# Patient Record
Sex: Female | Born: 1950 | Race: White | Hispanic: No | Marital: Married | Smoking: Never smoker
Health system: Southern US, Community
[De-identification: ages and names within clinical notes are randomized; demographics above are authoritative.]

## PROBLEM LIST (undated history)

## (undated) DIAGNOSIS — I10 Essential (primary) hypertension: Secondary | ICD-10-CM

## (undated) DIAGNOSIS — T7840XA Allergy, unspecified, initial encounter: Secondary | ICD-10-CM

## (undated) DIAGNOSIS — E785 Hyperlipidemia, unspecified: Secondary | ICD-10-CM

## (undated) DIAGNOSIS — Z5189 Encounter for other specified aftercare: Secondary | ICD-10-CM

## (undated) DIAGNOSIS — K219 Gastro-esophageal reflux disease without esophagitis: Secondary | ICD-10-CM

## (undated) DIAGNOSIS — R7301 Impaired fasting glucose: Secondary | ICD-10-CM

## (undated) DIAGNOSIS — F419 Anxiety disorder, unspecified: Secondary | ICD-10-CM

## (undated) DIAGNOSIS — R112 Nausea with vomiting, unspecified: Secondary | ICD-10-CM

## (undated) DIAGNOSIS — M797 Fibromyalgia: Secondary | ICD-10-CM

## (undated) DIAGNOSIS — F32A Depression, unspecified: Secondary | ICD-10-CM

## (undated) DIAGNOSIS — R011 Cardiac murmur, unspecified: Secondary | ICD-10-CM

## (undated) DIAGNOSIS — T8859XA Other complications of anesthesia, initial encounter: Secondary | ICD-10-CM

## (undated) DIAGNOSIS — T4145XA Adverse effect of unspecified anesthetic, initial encounter: Secondary | ICD-10-CM

## (undated) DIAGNOSIS — M26629 Arthralgia of temporomandibular joint, unspecified side: Secondary | ICD-10-CM

## (undated) DIAGNOSIS — J45909 Unspecified asthma, uncomplicated: Secondary | ICD-10-CM

## (undated) DIAGNOSIS — K5909 Other constipation: Secondary | ICD-10-CM

## (undated) DIAGNOSIS — H269 Unspecified cataract: Secondary | ICD-10-CM

## (undated) DIAGNOSIS — Z789 Other specified health status: Secondary | ICD-10-CM

## (undated) DIAGNOSIS — G35D Multiple sclerosis, unspecified: Secondary | ICD-10-CM

## (undated) DIAGNOSIS — F329 Major depressive disorder, single episode, unspecified: Secondary | ICD-10-CM

## (undated) DIAGNOSIS — I499 Cardiac arrhythmia, unspecified: Secondary | ICD-10-CM

## (undated) DIAGNOSIS — M1990 Unspecified osteoarthritis, unspecified site: Secondary | ICD-10-CM

## (undated) DIAGNOSIS — J189 Pneumonia, unspecified organism: Secondary | ICD-10-CM

## (undated) DIAGNOSIS — Z1379 Encounter for other screening for genetic and chromosomal anomalies: Secondary | ICD-10-CM

## (undated) DIAGNOSIS — Z9889 Other specified postprocedural states: Secondary | ICD-10-CM

## (undated) DIAGNOSIS — M810 Age-related osteoporosis without current pathological fracture: Secondary | ICD-10-CM

## (undated) DIAGNOSIS — G35 Multiple sclerosis: Secondary | ICD-10-CM

## (undated) DIAGNOSIS — E039 Hypothyroidism, unspecified: Secondary | ICD-10-CM

## (undated) HISTORY — DX: Fibromyalgia: M79.7

## (undated) HISTORY — DX: Encounter for other specified aftercare: Z51.89

## (undated) HISTORY — DX: Unspecified osteoarthritis, unspecified site: M19.90

## (undated) HISTORY — DX: Gastro-esophageal reflux disease without esophagitis: K21.9

## (undated) HISTORY — PX: EYE SURGERY: SHX253

## (undated) HISTORY — DX: Anxiety disorder, unspecified: F41.9

## (undated) HISTORY — DX: Depression, unspecified: F32.A

## (undated) HISTORY — DX: Hyperlipidemia, unspecified: E78.5

## (undated) HISTORY — PX: JOINT REPLACEMENT: SHX530

## (undated) HISTORY — DX: Encounter for other screening for genetic and chromosomal anomalies: Z13.79

## (undated) HISTORY — PX: ROTATOR CUFF REPAIR: SHX139

## (undated) HISTORY — PX: SPINE SURGERY: SHX786

## (undated) HISTORY — DX: Allergy, unspecified, initial encounter: T78.40XA

## (undated) HISTORY — DX: Multiple sclerosis, unspecified: G35.D

## (undated) HISTORY — DX: Essential (primary) hypertension: I10

## (undated) HISTORY — DX: Age-related osteoporosis without current pathological fracture: M81.0

## (undated) HISTORY — DX: Hypothyroidism, unspecified: E03.9

## (undated) HISTORY — DX: Unspecified cataract: H26.9

## (undated) HISTORY — PX: ABDOMINAL HYSTERECTOMY: SHX81

## (undated) HISTORY — DX: Multiple sclerosis: G35

## (undated) HISTORY — DX: Impaired fasting glucose: R73.01

## (undated) HISTORY — PX: KNEE ARTHROSCOPY: SUR90

## (undated) HISTORY — DX: Arthralgia of temporomandibular joint, unspecified side: M26.629

## (undated) HISTORY — DX: Major depressive disorder, single episode, unspecified: F32.9

## (undated) HISTORY — DX: Other specified health status: Z78.9

## (undated) HISTORY — DX: Other constipation: K59.09

## (undated) HISTORY — PX: BACK SURGERY: SHX140

## (undated) HISTORY — PX: TMJ ARTHROPLASTY: SHX1066

## (undated) HISTORY — PX: OTHER SURGICAL HISTORY: SHX169

---
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
## 2005-05-23 ENCOUNTER — Ambulatory Visit: Payer: Self-pay | Admitting: Ophthalmology

## 2005-05-30 ENCOUNTER — Ambulatory Visit: Payer: Self-pay | Admitting: Ophthalmology

## 2007-09-08 HISTORY — PX: TOTAL KNEE ARTHROPLASTY: SHX125

## 2011-03-15 ENCOUNTER — Ambulatory Visit: Payer: Self-pay

## 2011-06-14 ENCOUNTER — Ambulatory Visit: Payer: Self-pay

## 2011-11-09 ENCOUNTER — Ambulatory Visit: Payer: Self-pay

## 2012-06-14 ENCOUNTER — Ambulatory Visit: Payer: Self-pay

## 2013-02-14 LAB — HM COLONOSCOPY

## 2013-02-19 ENCOUNTER — Ambulatory Visit: Payer: Self-pay | Admitting: Otolaryngology

## 2013-06-19 ENCOUNTER — Ambulatory Visit: Payer: Self-pay

## 2014-05-07 DIAGNOSIS — M810 Age-related osteoporosis without current pathological fracture: Secondary | ICD-10-CM

## 2014-05-07 HISTORY — DX: Age-related osteoporosis without current pathological fracture: M81.0

## 2014-05-28 LAB — HM DEXA SCAN

## 2014-09-21 ENCOUNTER — Ambulatory Visit: Payer: Self-pay | Admitting: Family Medicine

## 2014-09-21 LAB — HM MAMMOGRAPHY

## 2014-12-07 DIAGNOSIS — M810 Age-related osteoporosis without current pathological fracture: Secondary | ICD-10-CM | POA: Insufficient documentation

## 2015-04-01 ENCOUNTER — Other Ambulatory Visit: Payer: Self-pay

## 2015-04-01 NOTE — Telephone Encounter (Signed)
Routing to provider  

## 2015-04-02 MED ORDER — CELECOXIB 200 MG PO CAPS: 200.0000 mg | ORAL_CAPSULE | Freq: Every day | ORAL | Status: DC | PRN

## 2015-05-07 DIAGNOSIS — G35 Multiple sclerosis: Secondary | ICD-10-CM | POA: Insufficient documentation

## 2015-05-07 DIAGNOSIS — K5909 Other constipation: Secondary | ICD-10-CM | POA: Insufficient documentation

## 2015-05-07 DIAGNOSIS — M797 Fibromyalgia: Secondary | ICD-10-CM | POA: Insufficient documentation

## 2015-05-07 DIAGNOSIS — M26629 Arthralgia of temporomandibular joint, unspecified side: Secondary | ICD-10-CM | POA: Insufficient documentation

## 2015-05-07 DIAGNOSIS — E785 Hyperlipidemia, unspecified: Secondary | ICD-10-CM | POA: Insufficient documentation

## 2015-05-07 DIAGNOSIS — M810 Age-related osteoporosis without current pathological fracture: Secondary | ICD-10-CM | POA: Insufficient documentation

## 2015-05-07 DIAGNOSIS — I10 Essential (primary) hypertension: Secondary | ICD-10-CM | POA: Insufficient documentation

## 2015-05-07 DIAGNOSIS — K219 Gastro-esophageal reflux disease without esophagitis: Secondary | ICD-10-CM | POA: Insufficient documentation

## 2015-05-07 DIAGNOSIS — M1990 Unspecified osteoarthritis, unspecified site: Secondary | ICD-10-CM | POA: Insufficient documentation

## 2015-05-07 DIAGNOSIS — F3342 Major depressive disorder, recurrent, in full remission: Secondary | ICD-10-CM | POA: Insufficient documentation

## 2015-05-07 DIAGNOSIS — R7301 Impaired fasting glucose: Secondary | ICD-10-CM | POA: Insufficient documentation

## 2015-05-07 DIAGNOSIS — F329 Major depressive disorder, single episode, unspecified: Secondary | ICD-10-CM

## 2015-05-07 DIAGNOSIS — E039 Hypothyroidism, unspecified: Secondary | ICD-10-CM | POA: Insufficient documentation

## 2015-05-07 DIAGNOSIS — F419 Anxiety disorder, unspecified: Secondary | ICD-10-CM | POA: Insufficient documentation

## 2015-05-13 ENCOUNTER — Ambulatory Visit: Payer: Self-pay | Admitting: Family Medicine

## 2015-05-27 ENCOUNTER — Ambulatory Visit: Payer: Self-pay | Admitting: Family Medicine

## 2015-06-01 ENCOUNTER — Telehealth: Payer: Self-pay

## 2015-06-01 NOTE — Telephone Encounter (Addendum)
Called patient regarding her and her husbands eye exam and colocancer screening. Patient said that she got her eye exam recently at [facility]. Her husband does not need one. Her husband doesn't have to get a colonoscopy but every 10 years and she doesn't but every five. Neither are due for one and she explained when they are do, the endoscopy center will call.  Will call [facility] For last eye exam.

## 2015-06-03 ENCOUNTER — Ambulatory Visit (INDEPENDENT_AMBULATORY_CARE_PROVIDER_SITE_OTHER): Payer: Medicare Other | Admitting: Family Medicine

## 2015-06-03 ENCOUNTER — Encounter: Payer: Self-pay | Admitting: Family Medicine

## 2015-06-03 VITALS — BP 112/71 | HR 81 | Temp 99.2°F | Ht 64.0 in | Wt 194.8 lb

## 2015-06-03 DIAGNOSIS — K219 Gastro-esophageal reflux disease without esophagitis: Secondary | ICD-10-CM

## 2015-06-03 DIAGNOSIS — Z5181 Encounter for therapeutic drug level monitoring: Secondary | ICD-10-CM | POA: Diagnosis not present

## 2015-06-03 DIAGNOSIS — E032 Hypothyroidism due to medicaments and other exogenous substances: Secondary | ICD-10-CM | POA: Diagnosis not present

## 2015-06-03 DIAGNOSIS — K5909 Other constipation: Secondary | ICD-10-CM

## 2015-06-03 DIAGNOSIS — K5900 Constipation, unspecified: Secondary | ICD-10-CM | POA: Diagnosis not present

## 2015-06-03 DIAGNOSIS — M26629 Arthralgia of temporomandibular joint, unspecified side: Secondary | ICD-10-CM | POA: Diagnosis not present

## 2015-06-03 DIAGNOSIS — Z23 Encounter for immunization: Secondary | ICD-10-CM

## 2015-06-03 DIAGNOSIS — E785 Hyperlipidemia, unspecified: Secondary | ICD-10-CM

## 2015-06-03 DIAGNOSIS — I10 Essential (primary) hypertension: Secondary | ICD-10-CM

## 2015-06-03 DIAGNOSIS — R7301 Impaired fasting glucose: Secondary | ICD-10-CM | POA: Diagnosis not present

## 2015-06-03 NOTE — Assessment & Plan Note (Signed)
Check lipids today; try to limit saturated

## 2015-06-03 NOTE — Assessment & Plan Note (Signed)
Continue flexeril PRN

## 2015-06-03 NOTE — Assessment & Plan Note (Signed)
Well-controlled, continue regimen; try DASH guidelines; monitor creatinine

## 2015-06-03 NOTE — Progress Notes (Signed)
BP 112/71 mmHg  Pulse 81  Temp(Src) 99.2 F (37.3 C)  Ht 5\' 4"  (1.626 m)  Wt 194 lb 12.8 oz (88.361 kg)  BMI 33.42 kg/m2  SpO2 96%   Subjective:    Patient ID: Brittany Hill, female    DOB: 23-Aug-1950, 64 y.o.   MRN: [id number]  HPI: Brittany Hill is a 64 y.o. female  Chief Complaint  Patient presents with  . Follow-up    6 month follow-up. Patient states everything seems to be going fine.  . Skin Problem    Patient diagnosed with rosacea on cheek.    High blood pressure; not checking BP at home; really big on sugar, but not big on salt; knows about DASH guidelines; does not add salt to anything; reads labels at grocery stored  Prediabetes; brother had type 2 DM from [place] ([place]), then got lung cancer but with weight loss, the diabetes went away; only DM in the family; she does like sweets; likes white wheat; no sweetened drinks, drinks diet Mt Dew; eyes checked a few weeks ago; she does not exercise; did water exercise for a while, got away from that  Hypothyroidism; due for labs; 100 mcg stable for a while; energy level is not great; busy life right now; bowels are sluggish; drinking plum smart light; really constipated today; weight goes up and down; rosacea has some dry skin; hair is thinning  Osteoporosis; managed by rheum; getting 3 servings of calcium a day  Flu shot today  Rosacea recently diagnosed by Dr. Cheree Ditto in [facility]; taking hylatopic plus and ivermectin cream; knows to avoid coffee and sun exposure, heat, acid foods  TMJ helped by flexeril; uses celebrex for back pain  Chronic constipation; has used miralax in past, but didn't like it  Past Medical History  Diagnosis Date  . Fibromyalgia   . MS (multiple sclerosis) (HCC)     hx of long-term corticosteroid use  . Chronic constipation   . OA (osteoarthritis)   . Hypothyroidism   . Osteoporosis 05/2014    managed by Rheumatologist  . IFG (impaired fasting glucose)   . Hypertension    . Hyperlipidemia   . GERD (gastroesophageal reflux disease)   . Depression   . Anxiety   . TMJ syndrome    Relevant past medical, surgical, family and social history reviewed and updated as indicated. Interim medical history since our last visit reviewed. Allergies and medications reviewed and updated.  Review of Systems  HENT: Negative for nosebleeds.   Gastrointestinal: Positive for constipation. Negative for blood in stool.  Genitourinary: Negative for hematuria.  Musculoskeletal: Positive for back pain.  Skin:       Dry skin and rosacea  Hematological: Bruises/bleeds easily (on aspirin).  Per HPI unless specifically indicated above     Objective:    BP 112/71 mmHg  Pulse 81  Temp(Src) 99.2 F (37.3 C)  Ht 5\' 4"  (1.626 m)  Wt 194 lb 12.8 oz (88.361 kg)  BMI 33.42 kg/m2  SpO2 96%  Wt Readings from Last 3 Encounters:  06/03/15 194 lb 12.8 oz (88.361 kg)  11/20/14 197 lb (89.359 kg)    Physical Exam  Constitutional: She appears well-developed and well-nourished. No distress.  HENT:  Head: Normocephalic and atraumatic.  Eyes: EOM are normal. No scleral icterus.  Neck: No thyromegaly present.  Cardiovascular: Normal rate, regular rhythm and normal heart sounds.   No murmur heard. Pulmonary/Chest: Effort normal and breath sounds normal. No respiratory distress. She has  no wheezes.  Abdominal: Soft. Bowel sounds are normal. She exhibits no distension.  Musculoskeletal: Normal range of motion. She exhibits no edema.  Neurological: She is alert. She exhibits normal muscle tone.  Skin: Skin is warm and dry. She is not diaphoretic. No pallor.  Mild erythema of cheeks; not butterfly rash  Psychiatric: She has a normal mood and affect. Her behavior is normal. Judgment and thought content normal.      Assessment & Plan:   Problem List Items Addressed This Visit      Cardiovascular and Mediastinum   Hypertension - Primary    Well-controlled, continue regimen; try DASH  guidelines; monitor creatinine      Relevant Medications   EPINEPHrine 0.3 mg/0.3 mL IJ SOAJ injection     Digestive   Chronic constipation    Try to get enough fiber and water; check TSH; consider miralax or colace; she prefers plum smart light      GERD (gastroesophageal reflux disease)    Using zantac; try elevating HOB; avoid triggers        Endocrine   Hypothyroidism    Check TSH and adjust dose if needed      Relevant Orders   TSH (Completed)   IFG (impaired fasting glucose)    Work on weight loss, activity; healthy diet; check A1C today      Relevant Orders   Hgb A1c w/o eAG (Completed)     Musculoskeletal and Integument   TMJ syndrome    Continue flexeril PRN        Other   Hyperlipidemia    Check lipids today; try to limit saturated      Relevant Medications   EPINEPHrine 0.3 mg/0.3 mL IJ SOAJ injection   Other Relevant Orders   Lipid Panel w/o Chol/HDL Ratio (Completed)    Other Visit Diagnoses    Need for influenza vaccination        Relevant Orders    Flu Vaccine QUAD 36+ mos PF IM (Fluarix & Fluzone Quad PF) (Completed)    Medication monitoring encounter        Relevant Orders    Comprehensive metabolic panel (Completed)    CBC with Differential/Platelet (Completed)       Follow up plan: Return in about 6 months (around 12/02/2015) for follow-up; also, physical when due.  Orders Placed This Encounter  Procedures  . Flu Vaccine QUAD 36+ mos PF IM (Fluarix & Fluzone Quad PF)  . Hgb A1c w/o eAG  . Lipid Panel w/o Chol/HDL Ratio  . Comprehensive metabolic panel  . TSH  . CBC with Differential/Platelet   Meds ordered this encounter  Medications  . EPINEPHrine 0.3 mg/0.3 mL IJ SOAJ injection    Sig:   . Ivermectin (SOOLANTRA) 1 % CREA    Sig: Apply 1 % topically once.  . Dermatological Products, Misc. (HYLATOPIC PLUS) CREA    Sig: Apply topically.   An after-visit summary was printed and given to the patient at check-out.  Please see  the patient instructions which may contain other information and recommendations beyond what is mentioned above in the assessment and plan.

## 2015-06-03 NOTE — Assessment & Plan Note (Addendum)
Try to get enough fiber and water; check TSH; consider miralax or colace; she prefers plum smart light

## 2015-06-03 NOTE — Assessment & Plan Note (Signed)
Using zantac; try elevating HOB; avoid triggers

## 2015-06-03 NOTE — Assessment & Plan Note (Signed)
Work on weight loss, activity; healthy diet; check A1C today

## 2015-06-03 NOTE — Patient Instructions (Addendum)
You received the flu shot today; it should protect you against the flu virus over the coming months; it will take about two weeks for antibodies to develop; do try to stay away from hospitals, nursing homes, and daycares during peak flu season; taking 500 to 1000 mg of vitamin C daily during flu season may help you avoid getting sick  Try to limit sweets and white bread  Try to increase your activity  Check out the information at familydoctor.org entitled "What It Takes to Lose Weight" Try to lose between 1-2 pounds per week by taking in fewer calories and burning off more calories You can succeed by limiting portions, limiting foods dense in calories and fat, becoming more active, and drinking 8 glasses of water a day (64 ounces) Don't skip meals, especially breakfast, as skipping meals may alter your metabolism Do not use over-the-counter weight loss pills or gimmicks that claim rapid weight loss A healthy BMI (or body mass index) is between 18.5 and 24.9 You can calculate your ideal BMI at the NIH website [employer]  We'll get labs today and let you know those results  Take your aspirin one hour or more prior to the Celebrex  Try to limit saturated fats in your diet (bologna, hot dogs, barbeque, cheeseburgers, hamburgers, steak, bacon, sausage, cheese, etc.) and get more fresh fruits, vegetables, and whole grains

## 2015-06-03 NOTE — Assessment & Plan Note (Signed)
Check TSH and adjust dose if needed 

## 2015-06-04 LAB — CBC WITH DIFFERENTIAL/PLATELET
BASOS: 0 %
Basophils Absolute: 0 10*3/uL (ref 0.0–0.2)
EOS (ABSOLUTE): 0.3 10*3/uL (ref 0.0–0.4)
EOS: 5 %
HEMATOCRIT: 39.2 % (ref 34.0–46.6)
HEMOGLOBIN: 13.2 g/dL (ref 11.1–15.9)
IMMATURE GRANS (ABS): 0 10*3/uL (ref 0.0–0.1)
IMMATURE GRANULOCYTES: 0 %
LYMPHS: 28 %
Lymphocytes Absolute: 1.8 10*3/uL (ref 0.7–3.1)
MCH: 29.5 pg (ref 26.6–33.0)
MCHC: 33.7 g/dL (ref 31.5–35.7)
MCV: 88 fL (ref 79–97)
MONOCYTES: 11 %
Monocytes Absolute: 0.7 10*3/uL (ref 0.1–0.9)
NEUTROS ABS: 3.6 10*3/uL (ref 1.4–7.0)
NEUTROS PCT: 56 %
PLATELETS: 241 10*3/uL (ref 150–379)
RBC: 4.48 x10E6/uL (ref 3.77–5.28)
RDW: 13.5 % (ref 12.3–15.4)
WBC: 6.5 10*3/uL (ref 3.4–10.8)

## 2015-06-04 LAB — LIPID PANEL W/O CHOL/HDL RATIO
CHOLESTEROL TOTAL: 150 mg/dL (ref 100–199)
HDL: 56 mg/dL (ref >39)
LDL Calculated: 69 mg/dL (ref 0–99)
Triglycerides: 123 mg/dL (ref 0–149)
VLDL CHOLESTEROL CAL: 25 mg/dL (ref 5–40)

## 2015-06-04 LAB — HGB A1C W/O EAG: HEMOGLOBIN A1C: 6.3 % — AB (ref 4.8–5.6)

## 2015-06-04 LAB — COMPREHENSIVE METABOLIC PANEL
A/G RATIO: 1.9 (ref 1.1–2.5)
ALK PHOS: 51 IU/L (ref 39–117)
ALT: 19 IU/L (ref 0–32)
AST: 22 IU/L (ref 0–40)
Albumin: 4.5 g/dL (ref 3.6–4.8)
BILIRUBIN TOTAL: 0.4 mg/dL (ref 0.0–1.2)
BUN / CREAT RATIO: 9 — AB (ref 11–26)
BUN: 7 mg/dL — ABNORMAL LOW (ref 8–27)
CO2: 27 mmol/L (ref 18–29)
Calcium: 9.6 mg/dL (ref 8.7–10.3)
Chloride: 97 mmol/L (ref 97–106)
Creatinine, Ser: 0.76 mg/dL (ref 0.57–1.00)
GFR calc Af Amer: 96 mL/min/{1.73_m2} (ref >59)
GFR calc non Af Amer: 83 mL/min/{1.73_m2} (ref >59)
GLOBULIN, TOTAL: 2.4 g/dL (ref 1.5–4.5)
Glucose: 107 mg/dL — ABNORMAL HIGH (ref 65–99)
Potassium: 4 mmol/L (ref 3.5–5.2)
Sodium: 139 mmol/L (ref 136–144)
Total Protein: 6.9 g/dL (ref 6.0–8.5)

## 2015-06-04 LAB — TSH: TSH: 0.964 u[IU]/mL (ref 0.450–4.500)

## 2015-06-08 ENCOUNTER — Other Ambulatory Visit: Payer: Self-pay

## 2015-06-08 MED ORDER — METOPROLOL SUCCINATE ER 25 MG PO TB24: 25.0000 mg | ORAL_TABLET | Freq: Every day | ORAL | Status: DC

## 2015-06-08 MED ORDER — CELECOXIB 200 MG PO CAPS: 200.0000 mg | ORAL_CAPSULE | Freq: Every day | ORAL | Status: DC | PRN

## 2015-06-08 NOTE — Telephone Encounter (Signed)
October 2016 labs reviewed; she's been tolerating celebrex despite sulfa allergy; Rxs approved

## 2015-06-14 ENCOUNTER — Encounter: Payer: Self-pay | Admitting: Family Medicine

## 2015-06-24 ENCOUNTER — Encounter: Payer: Medicare Other | Admitting: Family Medicine

## 2015-07-05 ENCOUNTER — Other Ambulatory Visit: Payer: Self-pay | Admitting: Unknown Physician Specialty

## 2015-07-08 ENCOUNTER — Encounter: Payer: Medicare Other | Admitting: Family Medicine

## 2015-07-28 ENCOUNTER — Ambulatory Visit (INDEPENDENT_AMBULATORY_CARE_PROVIDER_SITE_OTHER): Payer: Medicare Other | Admitting: Family Medicine

## 2015-07-28 ENCOUNTER — Encounter: Payer: Self-pay | Admitting: Family Medicine

## 2015-07-28 VITALS — BP 136/83 | HR 81 | Temp 98.1°F | Ht 64.0 in | Wt 188.0 lb

## 2015-07-28 DIAGNOSIS — M810 Age-related osteoporosis without current pathological fracture: Secondary | ICD-10-CM

## 2015-07-28 DIAGNOSIS — Z0000 Encounter for general adult medical examination without abnormal findings: Secondary | ICD-10-CM | POA: Diagnosis not present

## 2015-07-28 DIAGNOSIS — G35 Multiple sclerosis: Secondary | ICD-10-CM

## 2015-07-28 NOTE — Progress Notes (Signed)
Patient ID: Brittany Hill, female   DOB: Feb 08, 1951, 64 y.o.   MRN: [id number]   Subjective:   Brittany Hill is a 64 y.o. female here for a complete physical exam  Interim issues since last visit: had an injection in her back; better; Dr. 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     VLDL Cholesterol Cal 5 - 40 mg/dL 25   LDL Calculated 0 - 99 mg/dL 69       On vytoriin  Glucose: 107 in October, A1c  Hgb A1c MFr Bld 4.8 - 5.6 % 6.3 (H)       Colorectal cancer: due in 2019; Dr. Pablo Lawrence Breast cancer: last Feb 2016; discussed 1-2 years, okay for 1 BRCA gene screening: no ovarian cancer, no ovaries Intimate partner violence: no Cervical cancer screening: s/p hysterectomy noncancerous reasons Lung cancer: never smoker Osteoporosis: taking treatment now with rheumatologist Fall prevention/vitamin D: discussed, taking vit D AAA: n/a Aspirin: 81 mg aspirin Diet: better than typical [place] eater with fats; no sausage or bacons Exercise: back interfering with exercise, hoping to try Skin cancer: no worrisome moles, sees derm in [place]   Past Medical History  Diagnosis Date  . Fibromyalgia   . MS (multiple sclerosis) (HCC)     hx of long-term corticosteroid use  . Chronic constipation   . OA (osteoarthritis)   . Hypothyroidism   . Osteoporosis 05/2014    managed by  Rheumatologist  . IFG (impaired fasting glucose)   . Hypertension   . Hyperlipidemia   . GERD (gastroesophageal reflux disease)   . Depression   . Anxiety   . TMJ syndrome   anterolisthesis 6 mm, seen at [facility]  Past Surgical History  Procedure Laterality Date  . Abdominal hysterectomy      complete  . Total knee arthroplasty Right 09/2007  . Knee arthroscopy Right     x 2  . Rotator cuff repair     Family History  Problem Relation Age of Onset  . Cancer Mother     Lymphoma  . Heart disease Mother   . Hypertension Mother   . Stroke Father   . Heart attack Father   . Heart disease Father   . Hypertension Father   . Alcohol abuse Father   . Hypertension Sister   . Diabetes Sister   . Cancer Sister     colon  . Hypertension Brother   . Diabetes Brother   . Mental illness Brother   . Depression Brother   . Alcohol abuse Son    Social History  Substance Use Topics  . Smoking status: Never Smoker   . Smokeless tobacco: Never Used  . Alcohol Use: No   Review of Systems  Constitutional: Negative for unexpected weight change.  HENT: Negative for hearing loss and nosebleeds.   Eyes: Negative for visual disturbance.  Respiratory: Negative for  cough and wheezing.   Cardiovascular: Negative for chest pain and leg swelling.  Gastrointestinal: Negative for blood in stool.  Endocrine: Positive for polydipsia (more good hydration, not cotton mouth) and polyuria.  Genitourinary: Negative for hematuria, vaginal pain and pelvic pain.  Musculoskeletal: Positive for back pain (followed by [facility]).  Allergic/Immunologic: Positive for food allergies (seafood, has epipen).  Neurological: Negative for tremors.  Hematological: Bruises/bleeds easily (little bit).  Psychiatric/Behavioral: Negative for dysphoric mood.    Objective:   Filed Vitals:   07/28/15 0817  BP: 136/83  Pulse: 81  Temp: 98.1 F (36.7 C)  Height: '5\' 4"'  (1.626 m)  Weight: 188 lb (85.276 kg)  SpO2: 100%    Body mass index is 32.25 kg/(m^2). Wt Readings from Last 3 Encounters:  07/28/15 188 lb (85.276 kg)  06/03/15 194 lb 12.8 oz (88.361 kg)  11/20/14 197 lb (89.359 kg)   Physical Exam  Constitutional: She appears well-developed and well-nourished.  HENT:  Head: Normocephalic and atraumatic.  Right Ear: Hearing, tympanic membrane, external ear and ear canal normal.  Left Ear: Hearing, tympanic membrane, external ear and ear canal normal.  Eyes: Conjunctivae and EOM are normal. Right eye exhibits no hordeolum. Left eye exhibits no hordeolum. No scleral icterus.  Neck: Carotid bruit is not present. No thyromegaly present.  Cardiovascular: Normal rate, regular rhythm, S1 normal, S2 normal and normal heart sounds.   No extrasystoles are present.  Pulmonary/Chest: Effort normal and breath sounds normal. No respiratory distress. Right breast exhibits no inverted nipple, no mass, no nipple discharge, no skin change and no tenderness. Left breast exhibits no inverted nipple, no mass, no nipple discharge, no skin change and no tenderness. Breasts are symmetrical.  Abdominal: Soft. Normal appearance and bowel sounds are normal. She exhibits no distension, no abdominal bruit, no pulsatile midline mass and no mass. There is no hepatosplenomegaly. There is no tenderness. No hernia.  Musculoskeletal: Normal range of motion. She exhibits no edema.  Lymphadenopathy:       Head (right side): No submandibular adenopathy present.       Head (left side): No submandibular adenopathy present.    She has no cervical adenopathy.    She has no axillary adenopathy.  Neurological: She is alert. She displays no tremor. No cranial nerve deficit. She exhibits normal muscle tone. Gait normal.  Reflex Scores:      Patellar reflexes are 2+ on the left side. Trace patellar tendon reflex on the right, 2+ on the left  Skin: Skin is warm and dry. No bruising and no ecchymosis noted. No cyanosis. No pallor.  Acrylic nails;  no palmar erythema  Psychiatric: Her speech is normal and behavior is normal. Thought content normal. Her mood appears not anxious. She does not exhibit a depressed mood.   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 acetaminophen (TYLENOL) 325 MG tablet    Sig: Take 325 mg by mouth.  . gabapentin (NEURONTIN) 300 MG capsule    Sig: Take 300 mg by mouth 3 (three) times daily.   No orders of the defined types were placed in this encounter.    Follow up plan: Return in about 1 year (around 07/27/2016) for complete physical.  An after-visit summary was printed and given to the patient at [facility].  Please see the patient instructions which may contain other information and recommendations beyond what is mentioned above in the assessment and plan.

## 2015-07-28 NOTE — Assessment & Plan Note (Signed)
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

## 2015-07-28 NOTE — Assessment & Plan Note (Signed)
Followed by rheum; discussed fall prevention, she takes vit D

## 2015-07-28 NOTE — Patient Instructions (Signed)
Health Maintenance, Female Adopting a healthy lifestyle and getting preventive care can go a long way to promote health and wellness. Talk with your health care provider about what schedule of regular examinations is right for you. This is a good chance for you to check in with your provider about disease prevention and staying healthy. In between checkups, there are plenty of things you can do on your own. Experts have done a lot of research about which lifestyle changes and preventive measures are most likely to keep you healthy. Ask your health care provider for more information. WEIGHT AND DIET  Eat a healthy diet  Be sure to include plenty of vegetables, fruits, low-fat dairy products, and lean protein.  Do not eat a lot of foods high in solid fats, added sugars, or salt.  Get regular exercise. This is one of the most important things you can do for your health.  Most adults should exercise for at least 150 minutes each week. The exercise should increase your heart rate and make you sweat (moderate-intensity exercise).  Most adults should also do strengthening exercises at least twice a week. This is in addition to the moderate-intensity exercise.  Maintain a healthy weight  Body mass index (BMI) is a measurement that can be used to identify possible weight problems. It estimates body fat based on height and weight. Your health care provider can help determine your BMI and help you achieve or maintain a healthy weight.  For females 20 years of age and older:   A BMI below 18.5 is considered underweight.  A BMI of 18.5 to 24.9 is normal.  A BMI of 25 to 29.9 is considered overweight.  A BMI of 30 and above is considered obese.  Watch levels of cholesterol and blood lipids  You should start having your blood tested for lipids and cholesterol at 64 years of age, then have this test every 5 years.  You may need to have your cholesterol levels checked more often if:  Your lipid  or cholesterol levels are high.  You are older than 64 years of age.  You are at high risk for heart disease.  CANCER SCREENING   Lung Cancer  Lung cancer screening is recommended for adults 55-80 years old who are at high risk for lung cancer because of a history of smoking.  A yearly low-dose CT scan of the lungs is recommended for people who:  Currently smoke.  Have quit within the past 15 years.  Have at least a 30-pack-year history of smoking. A pack year is smoking an average of one pack of cigarettes a day for 1 year.  Yearly screening should continue until it has been 15 years since you quit.  Yearly screening should stop if you develop a health problem that would prevent you from having lung cancer treatment.  Breast Cancer  Practice breast self-awareness. This means understanding how your breasts normally appear and feel.  It also means doing regular breast self-exams. Let your health care provider know about any changes, no matter how small.  If you are in your 20s or 30s, you should have a clinical breast exam (CBE) by a health care provider every 1-3 years as part of a regular health exam.  If you are 40 or older, have a CBE every year. Also consider having a breast X-ray (mammogram) every year.  If you have a family history of breast cancer, talk to your health care provider about genetic screening.  If you   are at high risk for breast cancer, talk to your health care provider about having an MRI and a mammogram every year.  Breast cancer gene (BRCA) assessment is recommended for women who have family members with BRCA-related cancers. BRCA-related cancers include:  Breast.  Ovarian.  Tubal.  Peritoneal cancers.  Results of the assessment will determine the need for genetic counseling and BRCA1 and BRCA2 testing. Cervical Cancer Your health care provider may recommend that you be screened regularly for cancer of the pelvic organs (ovaries, uterus, and  vagina). This screening involves a pelvic examination, including checking for microscopic changes to the surface of your cervix (Pap test). You may be encouraged to have this screening done every 3 years, beginning at age 21.  For women ages 30-65, health care providers may recommend pelvic exams and Pap testing every 3 years, or they may recommend the Pap and pelvic exam, combined with testing for human papilloma virus (HPV), every 5 years. Some types of HPV increase your risk of cervical cancer. Testing for HPV may also be done on women of any age with unclear Pap test results.  Other health care providers may not recommend any screening for nonpregnant women who are considered low risk for pelvic cancer and who do not have symptoms. Ask your health care provider if a screening pelvic exam is right for you.  If you have had past treatment for cervical cancer or a condition that could lead to cancer, you need Pap tests and screening for cancer for at least 20 years after your treatment. If Pap tests have been discontinued, your risk factors (such as having a new sexual partner) need to be reassessed to determine if screening should resume. Some women have medical problems that increase the chance of getting cervical cancer. In these cases, your health care provider may recommend more frequent screening and Pap tests. Colorectal Cancer  This type of cancer can be detected and often prevented.  Routine colorectal cancer screening usually begins at 64 years of age and continues through 64 years of age.  Your health care provider may recommend screening at an earlier age if you have risk factors for colon cancer.  Your health care provider may also recommend using home test kits to check for hidden blood in the stool.  A small camera at the end of a tube can be used to examine your colon directly (sigmoidoscopy or colonoscopy). This is done to check for the earliest forms of colorectal  cancer.  Routine screening usually begins at age 50.  Direct examination of the colon should be repeated every 5-10 years through 64 years of age. However, you may need to be screened more often if early forms of precancerous polyps or small growths are found. Skin Cancer  Check your skin from head to toe regularly.  Tell your health care provider about any new moles or changes in moles, especially if there is a change in a mole's shape or color.  Also tell your health care provider if you have a mole that is larger than the size of a pencil eraser.  Always use sunscreen. Apply sunscreen liberally and repeatedly throughout the day.  Protect yourself by wearing long sleeves, pants, a wide-brimmed hat, and sunglasses whenever you are outside. HEART DISEASE, DIABETES, AND HIGH BLOOD PRESSURE   High blood pressure causes heart disease and increases the risk of stroke. High blood pressure is more likely to develop in:  People who have blood pressure in the high end   of the normal range (130-139/85-89 mm Hg).  People who are overweight or obese.  People who are African American.  If you are 38-23 years of age, have your blood pressure checked every 3-5 years. If you are 61 years of age or older, have your blood pressure checked every year. You should have your blood pressure measured twice--once when you are at a hospital or clinic, and once when you are not at a hospital or clinic. Record the average of the two measurements. To check your blood pressure when you are not at a hospital or clinic, you can use:  An automated blood pressure machine at a pharmacy.  A home blood pressure monitor.  If you are between 45 years and 39 years old, ask your health care provider if you should take aspirin to prevent strokes.  Have regular diabetes screenings. This involves taking a blood sample to check your fasting blood sugar level.  If you are at a normal weight and have a low risk for diabetes,  have this test once every three years after 64 years of age.  If you are overweight and have a high risk for diabetes, consider being tested at a younger age or more often. PREVENTING INFECTION  Hepatitis B  If you have a higher risk for hepatitis B, you should be screened for this virus. You are considered at high risk for hepatitis B if:  You were born in a country where hepatitis B is common. Ask your health care provider which countries are considered high risk.  Your parents were born in a high-risk country, and you have not been immunized against hepatitis B (hepatitis B vaccine).  You have HIV or AIDS.  You use needles to inject street drugs.  You live with someone who has hepatitis B.  You have had sex with someone who has hepatitis B.  You get hemodialysis treatment.  You take certain medicines for conditions, including cancer, organ transplantation, and autoimmune conditions. Hepatitis C  Blood testing is recommended for:  Everyone born from [birth year] through 1965.  Anyone with known risk factors for hepatitis C. Sexually transmitted infections (STIs)  You should be screened for sexually transmitted infections (STIs) including gonorrhea and chlamydia if:  You are sexually active and are younger than 64 years of age.  You are older than 64 years of age and your health care provider tells you that you are at risk for this type of infection.  Your sexual activity has changed since you were last screened and you are at an increased risk for chlamydia or gonorrhea. Ask your health care provider if you are at risk.  If you do not have HIV, but are at risk, it may be recommended that you take a prescription medicine daily to prevent HIV infection. This is called pre-exposure prophylaxis (PrEP). You are considered at risk if:  You are sexually active and do not regularly use condoms or know the HIV status of your partner(s).  You take drugs by injection.  You are sexually  active with a partner who has HIV. Talk with your health care provider about whether you are at high risk of being infected with HIV. If you choose to begin PrEP, you should first be tested for HIV. You should then be tested every 3 months for as long as you are taking PrEP.  PREGNANCY   If you are premenopausal and you may become pregnant, ask your health care provider about preconception counseling.  If you may  become pregnant, take 400 to 800 micrograms (mcg) of folic acid every day.  If you want to prevent pregnancy, talk to your health care provider about birth control (contraception). OSTEOPOROSIS AND MENOPAUSE   Osteoporosis is a disease in which the bones lose minerals and strength with aging. This can result in serious bone fractures. Your risk for osteoporosis can be identified using a bone density scan.  If you are 61 years of age or older, or if you are at risk for osteoporosis and fractures, ask your health care provider if you should be screened.  Ask your health care provider whether you should take a calcium or vitamin D supplement to lower your risk for osteoporosis.  Menopause may have certain physical symptoms and risks.  Hormone replacement therapy may reduce some of these symptoms and risks. Talk to your health care provider about whether hormone replacement therapy is right for you.  HOME CARE INSTRUCTIONS   Schedule regular health, dental, and eye exams.  Stay current with your immunizations.   Do not use any tobacco products including cigarettes, chewing tobacco, or electronic cigarettes.  If you are pregnant, do not drink alcohol.  If you are breastfeeding, limit how much and how often you drink alcohol.  Limit alcohol intake to no more than 1 drink per day for nonpregnant women. One drink equals 12 ounces of beer, 5 ounces of wine, or 1 ounces of hard liquor.  Do not use street drugs.  Do not share needles.  Ask your health care provider for help if  you need support or information about quitting drugs.  Tell your health care provider if you often feel depressed.  Tell your health care provider if you have ever been abused or do not feel safe at home.   This information is not intended to replace advice given to you by your health care provider. Make sure you discuss any questions you have with your health care provider.   Document Released: 02/06/2011 Document Revised: 08/14/2014 Document Reviewed: 06/25/2013 Elsevier Interactive Patient Education [employer].

## 2015-07-28 NOTE — Assessment & Plan Note (Signed)
USPSTF grade A and B recommendations reviewed with patient; age-appropriate recommendations, preventive care, screening tests, etc discussed and encouraged; healthy living encouraged; see AVS for patient education given to patient  

## 2015-08-03 ENCOUNTER — Other Ambulatory Visit: Payer: Self-pay | Admitting: Family Medicine

## 2015-08-04 NOTE — Telephone Encounter (Signed)
June 03, 2015 labs reviewed; Rxs approved

## 2015-08-08 DIAGNOSIS — J189 Pneumonia, unspecified organism: Secondary | ICD-10-CM

## 2015-08-08 HISTORY — DX: Pneumonia, unspecified organism: J18.9

## 2015-08-11 ENCOUNTER — Other Ambulatory Visit: Payer: Self-pay | Admitting: Family Medicine

## 2015-08-11 NOTE — Telephone Encounter (Signed)
Just seen in December; PHQ was 0; refills approved

## 2015-09-09 ENCOUNTER — Encounter: Payer: Self-pay | Admitting: Family Medicine

## 2015-09-09 ENCOUNTER — Ambulatory Visit (INDEPENDENT_AMBULATORY_CARE_PROVIDER_SITE_OTHER): Payer: Medicare Other | Admitting: Family Medicine

## 2015-09-09 VITALS — BP 135/85 | HR 93 | Temp 98.9°F | Ht 63.6 in | Wt 191.0 lb

## 2015-09-09 DIAGNOSIS — J069 Acute upper respiratory infection, unspecified: Secondary | ICD-10-CM | POA: Diagnosis not present

## 2015-09-09 MED ORDER — PREDNISONE 10 MG PO TABS: ORAL_TABLET | ORAL | Status: DC

## 2015-09-09 MED ORDER — HYDROCOD POLST-CPM POLST ER 10-8 MG/5ML PO SUER: 5.0000 mL | Freq: Every evening | ORAL | Status: DC | PRN

## 2015-09-09 MED ORDER — AMOXICILLIN-POT CLAVULANATE 875-125 MG PO TABS
1.0000 | ORAL_TABLET | Freq: Two times a day (BID) | ORAL | Status: DC
Start: 2015-09-09 — End: 2015-09-17

## 2015-09-09 NOTE — Assessment & Plan Note (Signed)
No sign of sinus infection yet, but lots of congestion. Will treat with prednisone to help decrease her congestion. Augmentin Rx given, not to fill unless she is worse by later this week. Tussionex given for comfort. Call if not getting better or getting worse.

## 2015-09-09 NOTE — Progress Notes (Signed)
BP 135/85 mmHg  Pulse 93  Temp(Src) 98.9 F (37.2 C)  Ht 5' 3.6" (1.615 m)  Wt 191 lb (86.637 kg)  BMI 33.22 kg/m2  SpO2 98%   Subjective:    Patient ID: Brittany Hill, female    DOB: 11/16/1950, 65 y.o.   MRN: [id number]  HPI: Brittany Hill is a 65 y.o. female  Chief Complaint  Patient presents with  . URI    X 9 days   UPPER RESPIRATORY TRACT INFECTION Duration: 1.5 weeks Worst symptom: headache and cough Fever: yes Cough: yes Shortness of breath: no Wheezing: no Chest pain: no Chest tightness: no Chest congestion: yes Nasal congestion: yes Runny nose: yes Post nasal drip: yes Sneezing: yes Sore throat: yes Swollen glands: no Sinus pressure: yes Headache: yes Face pain: no Toothache: yes Ear pain: yes left Ear pressure: yes left Eyes red/itching:no Eye drainage/crusting: yes  Vomiting: no Rash: no Fatigue: yes Sick contacts: yes Strep contacts: no  Context: stable Recurrent sinusitis: yes, had sinus surgery about 3 years ago, and has been this bad Relief with OTC cold/cough medications: no  Treatments attempted: cold/sinus, mucinex, anti-histamine and pseudoephedrine   Relevant past medical, surgical, family and social history reviewed and updated as indicated. Interim medical history since our last visit reviewed. Allergies and medications reviewed and updated.  Review of Systems  Constitutional: Negative.   HENT: Negative.   Respiratory: Negative.   Cardiovascular: Negative.   Psychiatric/Behavioral: Negative.     Per HPI unless specifically indicated above     Objective:    BP 135/85 mmHg  Pulse 93  Temp(Src) 98.9 F (37.2 C)  Ht 5' 3.6" (1.615 m)  Wt 191 lb (86.637 kg)  BMI 33.22 kg/m2  SpO2 98%  Wt Readings from Last 3 Encounters:  09/09/15 191 lb (86.637 kg)  07/28/15 188 lb (85.276 kg)  06/03/15 194 lb 12.8 oz (88.361 kg)    Physical Exam  Constitutional: She is oriented to person, place, and time. She appears  well-developed and well-nourished. No distress.  HENT:  Head: Normocephalic and atraumatic.  Right Ear: Hearing and external ear normal.  Left Ear: Hearing and external ear normal.  Nose: Nose normal.  Mouth/Throat: Oropharynx is clear and moist. No oropharyngeal exudate.  Eyes: Conjunctivae, EOM and lids are normal. Pupils are equal, round, and reactive to light. Right eye exhibits no discharge. Left eye exhibits no discharge. No scleral icterus.  Neck: Normal range of motion. Neck supple. No JVD present. No tracheal deviation present. No thyromegaly present.  Cardiovascular: Normal rate, regular rhythm, normal heart sounds and intact distal pulses.  Exam reveals no gallop and no friction rub.   No murmur heard. Pulmonary/Chest: Effort normal and breath sounds normal. No stridor. No respiratory distress. She has no wheezes. She has no rales. She exhibits no tenderness.  Musculoskeletal: Normal range of motion.  Lymphadenopathy:    She has cervical adenopathy.  Neurological: She is alert and oriented to person, place, and time.  Skin: Skin is warm, dry and intact. No rash noted. She is not diaphoretic. No erythema. No pallor.  Psychiatric: She has a normal mood and affect. Her speech is normal and behavior is normal. Judgment and thought content normal. Cognition and memory are normal.  Nursing note and vitals reviewed.   Results for orders placed or performed in visit on 06/03/15  Hgb A1c w/o eAG  Result Value Ref Range   Hgb A1c MFr Bld 6.3 (H) 4.8 - 5.6 %  Lipid  Panel w/o Chol/HDL Ratio  Result Value Ref Range   Cholesterol, Total 150 100 - 199 mg/dL   Triglycerides 147 0 - 149 mg/dL   HDL 56 >82 mg/dL   VLDL Cholesterol Cal 25 5 - 40 mg/dL   LDL Calculated 69 0 - 99 mg/dL  Comprehensive metabolic panel  Result Value Ref Range   Glucose 107 (H) 65 - 99 mg/dL   BUN 7 (L) 8 - 27 mg/dL   Creatinine, Ser 9.56 0.57 - 1.00 mg/dL   GFR calc non Af Amer 83 >59 mL/min/1.73   GFR calc  Af Amer 96 >59 mL/min/1.73   BUN/Creatinine Ratio 9 (L) 11 - 26   Sodium 139 136 - 144 mmol/L   Potassium 4.0 3.5 - 5.2 mmol/L   Chloride 97 97 - 106 mmol/L   CO2 27 18 - 29 mmol/L   Calcium 9.6 8.7 - 10.3 mg/dL   Total Protein 6.9 6.0 - 8.5 g/dL   Albumin 4.5 3.6 - 4.8 g/dL   Globulin, Total 2.4 1.5 - 4.5 g/dL   Albumin/Globulin Ratio 1.9 1.1 - 2.5   Bilirubin Total 0.4 0.0 - 1.2 mg/dL   Alkaline Phosphatase 51 39 - 117 IU/L   AST 22 0 - 40 IU/L   ALT 19 0 - 32 IU/L  TSH  Result Value Ref Range   TSH 0.964 0.450 - 4.500 uIU/mL  CBC with Differential/Platelet  Result Value Ref Range   WBC 6.5 3.4 - 10.8 x10E3/uL   RBC 4.48 3.77 - 5.28 x10E6/uL   Hemoglobin 13.2 11.1 - 15.9 g/dL   Hematocrit 21.3 08.6 - 46.6 %   MCV 88 79 - 97 fL   MCH 29.5 26.6 - 33.0 pg   MCHC 33.7 31.5 - 35.7 g/dL   RDW 57.8 46.9 - 62.9 %   Platelets 241 150 - 379 x10E3/uL   Neutrophils 56 %   Lymphs 28 %   Monocytes 11 %   Eos 5 %   Basos 0 %   Neutrophils Absolute 3.6 1.4 - 7.0 x10E3/uL   Lymphocytes Absolute 1.8 0.7 - 3.1 x10E3/uL   Monocytes Absolute 0.7 0.1 - 0.9 x10E3/uL   EOS (ABSOLUTE) 0.3 0.0 - 0.4 x10E3/uL   Basophils Absolute 0.0 0.0 - 0.2 x10E3/uL   Immature Granulocytes 0 %   Immature Grans (Abs) 0.0 0.0 - 0.1 x10E3/uL      Assessment & Plan:   Problem List Items Addressed This Visit      Respiratory   Upper respiratory infection - Primary    No sign of sinus infection yet, but lots of congestion. Will treat with prednisone to help decrease her congestion. Augmentin Rx given, not to fill unless she is worse by later this week. Tussionex given for comfort. Call if not getting better or getting worse.           Follow up plan: Return if symptoms worsen or fail to improve.

## 2015-09-16 ENCOUNTER — Telehealth: Payer: Self-pay | Admitting: Family Medicine

## 2015-09-16 NOTE — Telephone Encounter (Signed)
Patient husband called stating that pt needs to talk to someone regarding the medication Dr. Sherie Don gave her last Thursday. Patient states that she is still not feeling well but we do not have appointments available. Please call patient regarding this.

## 2015-09-16 NOTE — Telephone Encounter (Signed)
May I ask you to look at this?

## 2015-09-16 NOTE — Telephone Encounter (Signed)
She was seen last week by Dr.Johnson. Think she has bronchitis, was treated with Prednisone and finished it. She is taking Augmentin that Dr.Johnson wrote. She called to get an appointment today or tomorrow with you but you are fully booked. She thinks that she needs Cipro or Levaquin because they have worked well for her in the past but would need an rx for Zofran because they make her queasy.

## 2015-09-17 ENCOUNTER — Telehealth: Payer: Self-pay | Admitting: Family Medicine

## 2015-09-17 ENCOUNTER — Telehealth: Payer: Self-pay

## 2015-09-17 MED ORDER — PROMETHAZINE HCL 25 MG PO TABS: 12.5000 mg | ORAL_TABLET | Freq: Four times a day (QID) | ORAL | Status: DC | PRN

## 2015-09-17 MED ORDER — BENZONATATE 100 MG PO CAPS: 100.0000 mg | ORAL_CAPSULE | Freq: Three times a day (TID) | ORAL | Status: DC | PRN

## 2015-09-17 MED ORDER — LEVOFLOXACIN 500 MG PO TABS
500.0000 mg | ORAL_TABLET | Freq: Every day | ORAL | Status: DC
Start: 2015-09-17 — End: 2015-09-20

## 2015-09-17 NOTE — Telephone Encounter (Signed)
Patient needs a call back from Amy regarding an the conversation from yesterday. Thanks

## 2015-09-17 NOTE — Telephone Encounter (Signed)
Apparently my staff did not relay that I could have double-booked her earlier today; spoke with office manager; she did not go to urgent care I called patient personally Coughing and coughing, thinks she might have pneumonia; offered chest xray, she declined She started taking levaquin (I could NOT condone her taking someone else's medicine, obviously, and she understood); I will send Rx and I want her to take a 7 day course of 500 mg pills Take probiotic, discussed risk of C diff No night sweats; little bit of fever and chills Phenergan Rxd in case nausea with ABX; explained I do not want to do zofran b/c risk of QT prolongation Tessalon perles Rxd too Call me or come in next week if needed

## 2015-09-17 NOTE — Telephone Encounter (Signed)
Patient notified

## 2015-09-17 NOTE — Telephone Encounter (Signed)
Well, I gave her the augmentin to be filled early this week/Sunday if she wasn't better, and I gave her 10 days on that. So I wouldn't be comfortable switching her medication without seeing her.

## 2015-09-17 NOTE — Telephone Encounter (Signed)
I am not inclined to prescribe another antibiotic; Dr. Laural Benes treated her and she not have finished that antibiotic yet If symptoms worsening, urgent care or double book with me, but I'm not going to prescribe antibiotic Most cases of bronchitis are viral so a stronger antibiotic won't necessarily make her better faster

## 2015-09-19 NOTE — Telephone Encounter (Signed)
Spoke with Mr. Stogdill regarding his wife's illness and his disappointment that his wife was unable to obtain a same day appt. I explained to Mr. Gradilla that Dr. Sherie Don agreed to see Mrs. Suman but the CMA did not review the message until the afternoon and the providers schedule was booked by then.  I apologized for the confusion and advised Mr. Murley that I would alert Dr. Sherie Don regarding his concerns.

## 2015-09-20 ENCOUNTER — Ambulatory Visit (INDEPENDENT_AMBULATORY_CARE_PROVIDER_SITE_OTHER): Payer: Medicare Other | Admitting: Family Medicine

## 2015-09-20 ENCOUNTER — Ambulatory Visit
Admission: RE | Admit: 2015-09-20 | Discharge: 2015-09-20 | Disposition: A | Discharge status: Home / Self Care | Payer: Medicare Other | Source: Ambulatory Visit | Attending: Family Medicine | Admitting: Family Medicine

## 2015-09-20 ENCOUNTER — Encounter: Payer: Self-pay | Admitting: Family Medicine

## 2015-09-20 VITALS — BP 134/87 | HR 89 | Temp 99.7°F | Ht 64.0 in | Wt 192.7 lb

## 2015-09-20 DIAGNOSIS — J069 Acute upper respiratory infection, unspecified: Secondary | ICD-10-CM | POA: Diagnosis not present

## 2015-09-20 DIAGNOSIS — J180 Bronchopneumonia, unspecified organism: Secondary | ICD-10-CM | POA: Insufficient documentation

## 2015-09-20 LAB — CBC WITH DIFFERENTIAL/PLATELET
Hematocrit: 38.4 % (ref 34.0–46.6)
Hemoglobin: 13.6 g/dL (ref 11.1–15.9)
LYMPHS ABS: 2.7 10*3/uL (ref 0.7–3.1)
LYMPHS: 35 %
MCH: 31 pg (ref 26.6–33.0)
MCHC: 35.4 g/dL (ref 31.5–35.7)
MCV: 88 fL (ref 79–97)
MID (ABSOLUTE): 1 10*3/uL (ref 0.1–1.6)
MID: 13 %
NEUTROS ABS: 4 10*3/uL (ref 1.4–7.0)
NEUTROS PCT: 52 %
Platelets: 193 10*3/uL (ref 150–379)
RBC: 4.39 x10E6/uL (ref 3.77–5.28)
RDW: 14.2 % (ref 12.3–15.4)
WBC: 7.7 10*3/uL (ref 3.4–10.8)

## 2015-09-20 MED ORDER — AZITHROMYCIN 250 MG PO TABS: ORAL_TABLET | ORAL | Status: DC

## 2015-09-20 MED ORDER — AMOXICILLIN 500 MG PO CAPS: 1000.0000 mg | ORAL_CAPSULE | Freq: Three times a day (TID) | ORAL | Status: DC

## 2015-09-20 MED ORDER — ALBUTEROL SULFATE HFA 108 (90 BASE) MCG/ACT IN AERS: 2.0000 | INHALATION_SPRAY | RESPIRATORY_TRACT | Status: DC | PRN

## 2015-09-20 NOTE — Progress Notes (Signed)
BP 134/87 mmHg  Pulse 89  Temp(Src) 99.7 F (37.6 C)  Ht 5\' 4"  (1.626 m)  Wt 192 lb 11.2 oz (87.408 kg)  BMI 33.06 kg/m2  SpO2 96%   Subjective:    Patient ID: Brittany Hill, female    DOB: 02/24/1951, 65 y.o.   MRN: [id number]  HPI: Brittany Hill is a 65 y.o. female  Chief Complaint  Patient presents with  . Cough  . Nasal Congestion  . Headache  . Other    No ear pain. But ears seemed to be stopped up.    Patient is here for a sick visit; was seen by a colleague recently She is a little better but nowhere near well; coughing and bring up clear stuff from head; thick and yellow green from the chest; not sure if wheezing; used to have asthma but not frank wheezing She woke up this morning and hurting and aching all the way down from hips to knees Just so tired She was on prednisone Feb 2nd for 6 day course 36 year old granddaughter has had pneumonia for the 2nd time in 6 months; two other family members have had MAI in the last few years; mother-in-law had MAI most recently Wet cough Granddaughter is all better  Relevant past medical, surgical, family and social history reviewed and updated as indicated. Interim medical history since our last visit reviewed. Allergies and medications reviewed and updated.  Review of Systems Per HPI unless specifically indicated above     Objective:    BP 134/87 mmHg  Pulse 89  Temp(Src) 99.7 F (37.6 C)  Ht 5\' 4"  (1.626 m)  Wt 192 lb 11.2 oz (87.408 kg)  BMI 33.06 kg/m2  SpO2 96%  Wt Readings from Last 3 Encounters:  09/20/15 192 lb 11.2 oz (87.408 kg)  09/09/15 191 lb (86.637 kg)  07/28/15 188 lb (85.276 kg)    Physical Exam  Constitutional: She appears well-developed and well-nourished. No distress.  HENT:  Head: Normocephalic and atraumatic.  Eyes: EOM are normal.  Neck: No JVD present.  Cardiovascular: Normal rate.   Pulmonary/Chest: Effort normal. No respiratory distress. She has rhonchi (scattered). She  has no rales.  Abdominal: She exhibits no distension.  Musculoskeletal: She exhibits no edema.  Lymphadenopathy:    She has no cervical adenopathy.  Neurological: She is alert.  Skin: Skin is warm. No rash noted. No erythema. No pallor.  Psychiatric: She has a normal mood and affect.    Results for orders placed or performed in visit on 09/20/15  CBC With Differential/Platelet  Result Value Ref Range   WBC 7.7 3.4 - 10.8 x10E3/uL   RBC 4.39 3.77 - 5.28 x10E6/uL   Hemoglobin 13.6 11.1 - 15.9 g/dL   Hematocrit 31.5 40.0 - 46.6 %   MCV 88 79 - 97 fL   MCH 31.0 26.6 - 33.0 pg   MCHC 35.4 31.5 - 35.7 g/dL   RDW 86.7 61.9 - 50.9 %   Platelets 193 150 - 379 x10E3/uL   Neutrophils 52 %   Lymphs 35 %   MID 13 %   Neutrophils Absolute 4.0 1.4 - 7.0 x10E3/uL   Lymphocytes Absolute 2.7 0.7 - 3.1 x10E3/uL   MID (Absolute) 1.0 0.1 - 1.6 X10E3/uL      Assessment & Plan:   Problem List Items Addressed This Visit      Respiratory   Upper respiratory infection    Recently ill with URI, which sounds to have progressed; previous  note reviewed; ordering sputum culture, CBC, CXR      Relevant Medications   azithromycin (ZITHROMAX) 250 MG tablet    Other Visit Diagnoses    Bronchopneumonia    -  Primary    suspect secondary bacterial infection; start antibiotics; get sputum culture, CBC, CXR; close f/u if not feeling better, see AVS    Relevant Medications    amoxicillin (AMOXIL) 500 MG capsule    azithromycin (ZITHROMAX) 250 MG tablet    albuterol (PROVENTIL HFA;VENTOLIN HFA) 108 (90 Base) MCG/ACT inhaler    Other Relevant Orders    CBC With Differential/Platelet (Completed)    DG Chest 2 View (Completed)    Sputum culture        Follow up plan: No Follow-up on file.  Meds ordered this encounter  Medications  . amoxicillin (AMOXIL) 500 MG capsule    Sig: Take 2 capsules (1,000 mg total) by mouth 3 (three) times daily.    Dispense:  42 capsule    Refill:  0  . azithromycin  (ZITHROMAX) 250 MG tablet    Sig: Two pills by mouth today, then one pill daily for four more days    Dispense:  6 tablet    Refill:  0  . albuterol (PROVENTIL HFA;VENTOLIN HFA) 108 (90 Base) MCG/ACT inhaler    Sig: Inhale 2 puffs into the lungs every 4 (four) hours as needed for wheezing or shortness of breath.    Dispense:  1 Inhaler    Refill:  0   Orders Placed This Encounter  Procedures  . Sputum culture  . DG Chest 2 View  . CBC With Differential/Platelet   Level 4 An after-visit summary was printed and given to the patient at check-out.  Please see the patient instructions which may contain other information and recommendations beyond what is mentioned above in the assessment and plan.

## 2015-09-20 NOTE — Patient Instructions (Signed)
Stop the Levaquin Start the two new antibiotics today Try vitamin C (orange juice if not diabetic or vitamin C tablets) and drink green tea to help your immune system during your illness Get plenty of rest and hydration Please call me with an update in 24-48 hours Have a chest xray done today Call if any worsening symptoms

## 2015-09-22 ENCOUNTER — Telehealth: Payer: Self-pay

## 2015-09-22 NOTE — Telephone Encounter (Signed)
I'm so glad to hear that she is feeling a little better Try to get plenty of rest and call us if not continuing to get a little better each day

## 2015-09-22 NOTE — Telephone Encounter (Signed)
Called and gave patient Dr. Marlise Eves message.

## 2015-09-22 NOTE — Telephone Encounter (Signed)
Routing to provider  

## 2015-09-22 NOTE — Telephone Encounter (Signed)
-----   Message from Sharol Given sent at 09/22/2015 11:35 AM EST ----- Contact: [phone] Pt called and stated that she isn't coughing as bad and her headache is better. She was told to give [place] a call back in 48 hours to update [place] on how she was doing.

## 2015-09-22 NOTE — Telephone Encounter (Signed)
-----   Message from Christan M Williamson sent at 09/22/2015 11:35 AM EST ----- Contact: [phone] Pt called and stated that she isn't coughing as bad and her headache is better. She was told to give us a call back in 48 hours to update us on how she was doing.  

## 2015-10-02 NOTE — Assessment & Plan Note (Signed)
Recently ill with URI, which sounds to have progressed; previous note reviewed; ordering sputum culture, CBC, CXR

## 2015-11-30 ENCOUNTER — Other Ambulatory Visit: Payer: Self-pay | Admitting: Family Medicine

## 2015-11-30 NOTE — Telephone Encounter (Signed)
I cannot find in Epic that we have prescribed cyclobenzaprine for patient Please find out who prescribed it, when, and what for (what are her sx) This medicine has been associated with falls and somnolence and is not something to use long-term for most people Thank you

## 2015-12-01 NOTE — Telephone Encounter (Signed)
Pt states has got from neurologist in past for her MS.  I told her you were not a fan and the side effects they cause.  She states has an appt with you in am and will discuss then.

## 2015-12-01 NOTE — Telephone Encounter (Signed)
Please see note below thank you

## 2015-12-02 ENCOUNTER — Other Ambulatory Visit
Admission: RE | Admit: 2015-12-02 | Discharge: 2015-12-02 | Disposition: A | Discharge status: Home / Self Care | Payer: Medicare Other | Source: Ambulatory Visit | Attending: Family Medicine | Admitting: Family Medicine

## 2015-12-02 ENCOUNTER — Ambulatory Visit (INDEPENDENT_AMBULATORY_CARE_PROVIDER_SITE_OTHER): Payer: Medicare Other | Admitting: Family Medicine

## 2015-12-02 ENCOUNTER — Encounter: Payer: Self-pay | Admitting: Family Medicine

## 2015-12-02 VITALS — BP 132/82 | HR 78 | Temp 97.8°F | Resp 16 | Wt 190.0 lb

## 2015-12-02 DIAGNOSIS — E785 Hyperlipidemia, unspecified: Secondary | ICD-10-CM | POA: Diagnosis not present

## 2015-12-02 DIAGNOSIS — M810 Age-related osteoporosis without current pathological fracture: Secondary | ICD-10-CM

## 2015-12-02 DIAGNOSIS — E876 Hypokalemia: Secondary | ICD-10-CM | POA: Diagnosis present

## 2015-12-02 DIAGNOSIS — R7301 Impaired fasting glucose: Secondary | ICD-10-CM

## 2015-12-02 DIAGNOSIS — Z5181 Encounter for therapeutic drug level monitoring: Secondary | ICD-10-CM | POA: Diagnosis not present

## 2015-12-02 DIAGNOSIS — I10 Essential (primary) hypertension: Secondary | ICD-10-CM | POA: Diagnosis not present

## 2015-12-02 DIAGNOSIS — G35 Multiple sclerosis: Secondary | ICD-10-CM

## 2015-12-02 DIAGNOSIS — E032 Hypothyroidism due to medicaments and other exogenous substances: Secondary | ICD-10-CM

## 2015-12-02 LAB — LIPID PANEL
CHOLESTEROL: 137 mg/dL (ref 0–200)
HDL: 54 mg/dL (ref >40)
LDL Cholesterol: 53 mg/dL (ref 0–99)
TRIGLYCERIDES: 150 mg/dL — AB (ref <150)
Total CHOL/HDL Ratio: 2.5 RATIO
VLDL: 30 mg/dL (ref 0–40)

## 2015-12-02 LAB — COMPREHENSIVE METABOLIC PANEL
ALK PHOS: 45 U/L (ref 38–126)
ALT: 24 U/L (ref 14–54)
AST: 30 U/L (ref 15–41)
Albumin: 4.7 g/dL (ref 3.5–5.0)
Anion gap: 10 (ref 5–15)
BILIRUBIN TOTAL: 0.7 mg/dL (ref 0.3–1.2)
BUN: 10 mg/dL (ref 6–20)
CALCIUM: 10 mg/dL (ref 8.9–10.3)
CO2: 28 mmol/L (ref 22–32)
CREATININE: 0.69 mg/dL (ref 0.44–1.00)
Chloride: 98 mmol/L — ABNORMAL LOW (ref 101–111)
Glucose, Bld: 109 mg/dL — ABNORMAL HIGH (ref 65–99)
Potassium: 3.3 mmol/L — ABNORMAL LOW (ref 3.5–5.1)
Sodium: 136 mmol/L (ref 135–145)
Total Protein: 8 g/dL (ref 6.5–8.1)

## 2015-12-02 LAB — TSH: TSH: 1.152 u[IU]/mL (ref 0.350–4.500)

## 2015-12-02 LAB — HEMOGLOBIN A1C: Hgb A1c MFr Bld: 5.9 % (ref 4.0–6.0)

## 2015-12-02 LAB — MAGNESIUM: MAGNESIUM: 1.7 mg/dL (ref 1.7–2.4)

## 2015-12-02 MED ORDER — CYCLOBENZAPRINE HCL 10 MG PO TABS: 10.0000 mg | ORAL_TABLET | Freq: Three times a day (TID) | ORAL | Status: DC | PRN

## 2015-12-02 NOTE — Patient Instructions (Addendum)
Try to limit saturated fats in your diet (bologna, hot dogs, barbeque, cheeseburgers, hamburgers, steak, bacon, sausage, cheese, etc.) and get more fresh fruits, vegetables, and whole grains Let's get labs at the hospital If you have not heard anything from my staff in a week about any orders/referrals/studies from today, please contact us here to follow-up [phone] Your goal blood pressure is less than 150 mmHg on top. Try to follow the DASH guidelines (DASH stands for Dietary Approaches to Stop Hypertension) Try to limit the sodium in your diet.  Ideally, consume less than 1.5 grams (less than 1,500mg ) per day. Do not add salt when cooking or at the table.  Check the sodium amount on labels when shopping, and choose items lower in sodium when given a choice. Avoid or limit foods that already contain a lot of sodium. Eat a diet rich in fruits and vegetables and whole grains.  Cholesterol Cholesterol is a white, waxy, fat-like substance needed by your body in small amounts. The liver makes all the cholesterol you need. Cholesterol is carried from the liver by the blood through the blood vessels. Deposits of cholesterol (plaque) may build up on blood vessel walls. These make the arteries narrower and stiffer. Cholesterol plaques increase the risk for heart attack and stroke.  You cannot feel your cholesterol level even if it is very high. The only way to know it is high is with a blood test. Once you know your cholesterol levels, you should keep a record of the test results. Work with your health care provider to keep your levels in the desired range.  WHAT DO THE RESULTS MEAN?  Total cholesterol is a rough measure of all the cholesterol in your blood.   LDL is the so-called bad cholesterol. This is the type that deposits cholesterol in the walls of the arteries. You want this level to be low.   HDL is the good cholesterol because it cleans the arteries and carries the LDL away. You want this  level to be high.  Triglycerides are fat that the body can either burn for energy or store. High levels are closely linked to heart disease.  WHAT ARE THE DESIRED LEVELS OF CHOLESTEROL?  Total cholesterol below 200.   LDL below 100 for people at risk, below 70 for those at very high risk.   HDL above 50 is good, above 60 is best.   Triglycerides below 150.  HOW CAN I LOWER MY CHOLESTEROL?  Diet. Follow your diet programs as directed by your health care provider.   Choose fish or white meat chicken and [place], roasted or baked. Limit fatty cuts of red meat, fried foods, and processed meats, such as sausage and lunch meats.   Eat lots of fresh fruits and vegetables.  Choose whole grains, beans, pasta, potatoes, and cereals.   Use only small amounts of olive, corn, or canola oils.   Avoid butter, mayonnaise, shortening, or palm kernel oils.  Avoid foods with trans fats.   Drink skim or nonfat milk and eat low-fat or nonfat yogurt and cheeses. Avoid whole milk, cream, ice cream, egg yolks, and full-fat cheeses.   Healthy desserts include angel food cake, ginger snaps, animal crackers, hard candy, popsicles, and low-fat or nonfat frozen yogurt. Avoid pastries, cakes, pies, and cookies.   Exercise. Follow your exercise programs as directed by your health care provider.   A regular program helps decrease LDL and raise HDL.   A regular program helps with weight control.  Do things that increase your activity level like gardening, walking, or taking the stairs. Ask your health care provider about how you can be more active in your daily life.   Medicine. Take medicine only as directed by your health care provider.   Medicine may be prescribed by your health care provider to help lower cholesterol and decrease the risk for heart disease.   If you have several risk factors, you may need medicine even if your levels are normal.   This information is not intended  to replace advice given to you by your health care provider. Make sure you discuss any questions you have with your health care provider.   Document Released: 04/18/2001 Document Revised: 08/14/2014 Document Reviewed: 05/07/2013 Elsevier Interactive Patient Education 2016 Elsevier Inc. DASH Eating Plan DASH stands for "Dietary Approaches to Stop Hypertension." The DASH eating plan is a healthy eating plan that has been shown to reduce high blood pressure (hypertension). Additional health benefits may include reducing the risk of type 2 diabetes mellitus, heart disease, and stroke. The DASH eating plan may also help with weight loss. WHAT DO I NEED TO KNOW ABOUT THE DASH EATING PLAN? For the DASH eating plan, you will follow these general guidelines:  Choose foods with a percent daily value for sodium of less than 5% (as listed on the food label).  Use salt-free seasonings or herbs instead of table salt or sea salt.  Check with your health care provider or pharmacist before using salt substitutes.  Eat lower-sodium products, often labeled as "lower sodium" or "no salt added."  Eat fresh foods.  Eat more vegetables, fruits, and low-fat dairy products.  Choose whole grains. Look for the word "whole" as the first word in the ingredient list.  Choose fish and skinless chicken or [place] more often than red meat. Limit fish, poultry, and meat to 6 oz (170 g) each day.  Limit sweets, desserts, sugars, and sugary drinks.  Choose heart-healthy fats.  Limit cheese to 1 oz (28 g) per day.  Eat more home-cooked food and less restaurant, buffet, and fast food.  Limit fried foods.  Cook foods using methods other than frying.  Limit canned vegetables. If you do use them, rinse them well to decrease the sodium.  When eating at a restaurant, ask that your food be prepared with less salt, or no salt if possible. WHAT FOODS CAN I EAT? Seek help from a dietitian for individual calorie  needs. Grains Whole grain or whole wheat bread. Brown rice. Whole grain or whole wheat pasta. Quinoa, bulgur, and whole grain cereals. Low-sodium cereals. Corn or whole wheat flour tortillas. Whole grain cornbread. Whole grain crackers. Low-sodium crackers. Vegetables Fresh or frozen vegetables (raw, steamed, roasted, or grilled). Low-sodium or reduced-sodium tomato and vegetable juices. Low-sodium or reduced-sodium tomato sauce and paste. Low-sodium or reduced-sodium canned vegetables.  Fruits All fresh, canned (in natural juice), or frozen fruits. Meat and Other Protein Products Ground beef (85% or leaner), grass-fed beef, or beef trimmed of fat. Skinless chicken or [place]. Ground chicken or [place]. Pork trimmed of fat. All fish and seafood. Eggs. Dried beans, peas, or lentils. Unsalted nuts and seeds. Unsalted canned beans. Dairy Low-fat dairy products, such as skim or 1% milk, 2% or reduced-fat cheeses, low-fat ricotta or cottage cheese, or plain low-fat yogurt. Low-sodium or reduced-sodium cheeses. Fats and Oils Tub margarines without trans fats. Light or reduced-fat mayonnaise and salad dressings (reduced sodium). Avocado. Safflower, olive, or canola oils. Natural peanut or almond  butter. Other Unsalted popcorn and pretzels. The items listed above may not be a complete list of recommended foods or beverages. Contact your dietitian for more options. WHAT FOODS ARE NOT RECOMMENDED? Grains White bread. White pasta. White rice. Refined cornbread. Bagels and croissants. Crackers that contain trans fat. Vegetables Creamed or fried vegetables. Vegetables in a cheese sauce. Regular canned vegetables. Regular canned tomato sauce and paste. Regular tomato and vegetable juices. Fruits Dried fruits. Canned fruit in light or heavy syrup. Fruit juice. Meat and Other Protein Products Fatty cuts of meat. Ribs, chicken wings, bacon, sausage, bologna, salami, chitterlings, fatback, hot dogs, bratwurst,  and packaged luncheon meats. Salted nuts and seeds. Canned beans with salt. Dairy Whole or 2% milk, cream, half-and-half, and cream cheese. Whole-fat or sweetened yogurt. Full-fat cheeses or blue cheese. Nondairy creamers and whipped toppings. Processed cheese, cheese spreads, or cheese curds. Condiments Onion and garlic salt, seasoned salt, table salt, and sea salt. Canned and packaged gravies. Worcestershire sauce. Tartar sauce. Barbecue sauce. Teriyaki sauce. Soy sauce, including reduced sodium. Steak sauce. Fish sauce. Oyster sauce. Cocktail sauce. Horseradish. Ketchup and mustard. Meat flavorings and tenderizers. Bouillon cubes. Hot sauce. Tabasco sauce. Marinades. Taco seasonings. Relishes. Fats and Oils Butter, stick margarine, lard, shortening, ghee, and bacon fat. Coconut, palm kernel, or palm oils. Regular salad dressings. Other Pickles and olives. Salted popcorn and pretzels. The items listed above may not be a complete list of foods and beverages to avoid. Contact your dietitian for more information. WHERE CAN I FIND MORE INFORMATION? National Heart, Lung, and Blood Institute: [employer]   This information is not intended to replace advice given to you by your health care provider. Make sure you discuss any questions you have with your health care provider.   Document Released: 07/13/2011 Document Revised: 08/14/2014 Document Reviewed: 05/28/2013 Elsevier Interactive Patient Education [employer]

## 2015-12-02 NOTE — Assessment & Plan Note (Signed)
Will refill the cyclobenzaprine, do not drive afterwards

## 2015-12-02 NOTE — Progress Notes (Signed)
BP 132/82 mmHg  Pulse 78  Temp(Src) 97.8 F (36.6 C) (Oral)  Resp 16  Wt 190 lb (86.183 kg)  SpO2 97%   Subjective:    Patient ID: Brittany Hill, female    DOB: 10-Nov-1950, 65 y.o.   MRN: [id number]  HPI: Brittany Hill is a 65 y.o. female  Chief Complaint  Patient presents with  . Follow-up    6 month   HTN; limits salt and meats  High cholesterol; limit meats; not much cheese; loves eggs but not eating much; does drink skim milk; loves chocolate milk  Prediabetes; likes sweets; down almost 3 pounds; only family member who had diabetes was brother after agent orange exposure, and now he's lost weight and controlled  OA; doing pretty well; did not tolerate celebrex, so off of that; feels it more, needs the gabapentin more now for her back, so willing to not use celebrex; can't take turmeric; okay to use tylenol  MS; she saw Dr. Ron Parker at [facility] for her MS; he asked if she had ever used muscle relaxer; to help control tension or tightness in the muscle; does have tightness; she uses it every night and occasionally during the day; does not drive after taking this; taking so many years  She is going to have surgery on Monday; just had pre-op labs; H/H was 13.5, 39.2; potassium has always been low; takes horse pill  Osteoporosis; took one dose of reclast; rheum called her to say May 2nd; getting three servings of calcium a day; supplementing vit D  Hypothyroidism; deals with constipation, more of an MS issue; no more increased hair loss  Rosacea; using special make-up, controlling sx   Depression screen [facility] 2/9 12/02/2015 07/28/2015  Decreased Interest 0 0  Down, Depressed, Hopeless 0 0  PHQ - 2 Score 0 0   Relevant past medical, surgical, family and social history reviewed and updated as indicated. Past Medical History  Diagnosis Date  . Fibromyalgia   . MS (multiple sclerosis) (HCC)     hx of long-term corticosteroid use  . Chronic constipation   . OA  (osteoarthritis)   . Hypothyroidism   . Osteoporosis 05/2014    managed by Rheumatologist  . IFG (impaired fasting glucose)   . Hypertension   . Hyperlipidemia   . GERD (gastroesophageal reflux disease)   . Depression   . Anxiety   . TMJ syndrome    Past Surgical History  Procedure Laterality Date  . Abdominal hysterectomy      complete  . Total knee arthroplasty Right 09/2007  . Knee arthroscopy Right     x 2  . Rotator cuff repair     Family History  Problem Relation Age of Onset  . Cancer Mother     Lymphoma  . Heart disease Mother   . Hypertension Mother   . Stroke Father   . Heart attack Father   . Heart disease Father   . Hypertension Father   . Alcohol abuse Father   . Hypertension Sister   . Cancer Sister     colon  . Hypertension Brother   . Diabetes Brother   . Mental illness Brother   . Depression Brother   . Alcohol abuse Son    Social History  Substance Use Topics  . Smoking status: Never Smoker   . Smokeless tobacco: Never Used  . Alcohol Use: No   Interim medical history since our last visit reviewed. Allergies and medications reviewed and updated.  Review of Systems Per HPI unless specifically indicated above     Objective:    BP 132/82 mmHg  Pulse 78  Temp(Src) 97.8 F (36.6 C) (Oral)  Resp 16  Wt 190 lb (86.183 kg)  SpO2 97%  Wt Readings from Last 3 Encounters:  12/02/15 190 lb (86.183 kg)  09/20/15 192 lb 11.2 oz (87.408 kg)  09/09/15 191 lb (86.637 kg)    Physical Exam  Constitutional: She appears well-developed and well-nourished. No distress.  HENT:  Head: Normocephalic and atraumatic.  Eyes: EOM are normal. No scleral icterus.  Neck: No thyromegaly present.  Cardiovascular: Normal rate, regular rhythm and normal heart sounds.   No murmur heard. Pulmonary/Chest: Effort normal and breath sounds normal. No respiratory distress. She has no wheezes.  Abdominal: Soft. Bowel sounds are normal. She exhibits no distension.    Musculoskeletal: Normal range of motion. She exhibits no edema.  Neurological: She is alert. She exhibits normal muscle tone.  Skin: Skin is warm and dry. She is not diaphoretic. No pallor.  Psychiatric: She has a normal mood and affect. Her behavior is normal. Judgment and thought content normal.   Results for orders placed or performed in visit on 09/20/15  CBC With Differential/Platelet  Result Value Ref Range   WBC 7.7 3.4 - 10.8 x10E3/uL   RBC 4.39 3.77 - 5.28 x10E6/uL   Hemoglobin 13.6 11.1 - 15.9 g/dL   Hematocrit 53.6 64.4 - 46.6 %   MCV 88 79 - 97 fL   MCH 31.0 26.6 - 33.0 pg   MCHC 35.4 31.5 - 35.7 g/dL   RDW 03.4 74.2 - 59.5 %   Platelets 193 150 - 379 x10E3/uL   Neutrophils 52 %   Lymphs 35 %   MID 13 %   Neutrophils Absolute 4.0 1.4 - 7.0 x10E3/uL   Lymphocytes Absolute 2.7 0.7 - 3.1 x10E3/uL   MID (Absolute) 1.0 0.1 - 1.6 X10E3/uL      Assessment & Plan:   Problem List Items Addressed This Visit      Cardiovascular and Mediastinum   Hypertension - Primary    Well-controlled; try DASH guidelines, limit salt        Endocrine   Hypothyroidism    Check TSH; adjust if needed      Relevant Orders   TSH   IFG (impaired fasting glucose)    Check A1c and glucose; limit sweets      Relevant Orders   Hgb A1c w/o eAG     Nervous and Auditory   MS (multiple sclerosis) (HCC)    Will refill the cyclobenzaprine, do not drive afterwards        Musculoskeletal and Integument   Osteoporosis    Managed by rheum; manage fall risks        Other   Hyperlipidemia    Check lipids today      Relevant Orders   Lipid Panel w/o Chol/HDL Ratio   Medication monitoring encounter   Relevant Orders   Comprehensive metabolic panel   Hypokalemia   Relevant Orders   Aldosterone   Magnesium      Follow up plan: Return in about 6 months (around 06/02/2016) for fasting labs and visit with Dr. Sherie Don.  An after-visit summary was printed and given to the patient at  check-out.  Please see the patient instructions which may contain other information and recommendations beyond what is mentioned above in the assessment and plan.  Meds ordered this encounter  Medications  . cyclobenzaprine (  FLEXERIL) 10 MG tablet    Sig: Take 1 tablet (10 mg total) by mouth 3 (three) times daily as needed for muscle spasms.    Dispense:  60 tablet    Refill:  5    Orders Placed This Encounter  Procedures  . TSH  . Comprehensive metabolic panel  . Lipid Panel w/o Chol/HDL Ratio  . Hgb A1c w/o eAG  . Aldosterone  . Magnesium

## 2015-12-02 NOTE — Assessment & Plan Note (Signed)
Check A1c and glucose; limit sweets

## 2015-12-02 NOTE — Assessment & Plan Note (Signed)
Check TSH; adjust if needed

## 2015-12-02 NOTE — Assessment & Plan Note (Signed)
Check lipids today 

## 2015-12-02 NOTE — Assessment & Plan Note (Signed)
Well-controlled; try DASH guidelines, limit salt

## 2015-12-02 NOTE — Assessment & Plan Note (Signed)
Managed by rheum; manage fall risks

## 2015-12-06 HISTORY — PX: OTHER SURGICAL HISTORY: SHX169

## 2015-12-08 LAB — ALDOSTERONE: ALDOSTERONE: 15.9 ng/dL (ref 0.0–30.0)

## 2015-12-13 ENCOUNTER — Other Ambulatory Visit
Admission: RE | Admit: 2015-12-13 | Discharge: 2015-12-13 | Disposition: A | Discharge status: Home / Self Care | Payer: Medicare Other | Source: Ambulatory Visit | Attending: Family Medicine | Admitting: Family Medicine

## 2015-12-13 DIAGNOSIS — E876 Hypokalemia: Secondary | ICD-10-CM | POA: Diagnosis present

## 2015-12-13 LAB — BASIC METABOLIC PANEL
ANION GAP: 10 (ref 5–15)
BUN: <5 mg/dL — ABNORMAL LOW (ref 6–20)
CHLORIDE: 99 mmol/L — AB (ref 101–111)
CO2: 27 mmol/L (ref 22–32)
Calcium: 8.7 mg/dL — ABNORMAL LOW (ref 8.9–10.3)
Creatinine, Ser: 0.55 mg/dL (ref 0.44–1.00)
GFR calc non Af Amer: >60 mL/min (ref >60)
Glucose, Bld: 101 mg/dL — ABNORMAL HIGH (ref 65–99)
POTASSIUM: 3.5 mmol/L (ref 3.5–5.1)
SODIUM: 136 mmol/L (ref 135–145)

## 2015-12-13 LAB — MAGNESIUM: MAGNESIUM: 2.1 mg/dL (ref 1.7–2.4)

## 2015-12-16 ENCOUNTER — Ambulatory Visit
Admission: RE | Admit: 2015-12-16 | Discharge: 2015-12-16 | Disposition: A | Discharge status: Home / Self Care | Payer: Medicare Other | Source: Ambulatory Visit | Attending: Family Medicine | Admitting: Family Medicine

## 2015-12-16 ENCOUNTER — Encounter: Payer: Self-pay | Admitting: Family Medicine

## 2015-12-16 ENCOUNTER — Ambulatory Visit (INDEPENDENT_AMBULATORY_CARE_PROVIDER_SITE_OTHER): Payer: Medicare Other | Admitting: Family Medicine

## 2015-12-16 VITALS — BP 122/64 | HR 95 | Temp 98.7°F | Resp 14 | Wt 202.0 lb

## 2015-12-16 DIAGNOSIS — M5416 Radiculopathy, lumbar region: Secondary | ICD-10-CM | POA: Insufficient documentation

## 2015-12-16 DIAGNOSIS — J9811 Atelectasis: Secondary | ICD-10-CM | POA: Diagnosis not present

## 2015-12-16 DIAGNOSIS — E876 Hypokalemia: Secondary | ICD-10-CM

## 2015-12-16 DIAGNOSIS — I10 Essential (primary) hypertension: Secondary | ICD-10-CM

## 2015-12-16 MED ORDER — POTASSIUM CHLORIDE CRYS ER 20 MEQ PO TBCR: EXTENDED_RELEASE_TABLET | ORAL | Status: DC

## 2015-12-16 NOTE — Patient Instructions (Addendum)
Please have chest xray done today Please do call your surgeon after you leave here about the symptoms in the left leg Continue the incentive spirometry We'll have you see the kidney doctor soon Continue the potassium supplement, but alternate 40 mEq and 20 mEq every other day Keep next appointment

## 2015-12-16 NOTE — Assessment & Plan Note (Signed)
Noted on chest CT at [facility]; continue incentive spirometry; will get f/u CXR since she is having cough and low-grade fevers after surgery; go to ER if worsening

## 2015-12-16 NOTE — Assessment & Plan Note (Signed)
Following L4-5 laminectomy; new symptoms; I urged her to contact her neurosurgeon right away; may be inflammation near operative site, other issue

## 2015-12-16 NOTE — Progress Notes (Signed)
BP 122/64 mmHg  Pulse 95  Temp(Src) 98.7 F (37.1 C) (Oral)  Resp 14  Wt 202 lb (91.627 kg)  SpO2 98%   Subjective:    Patient ID: Brittany Hill, female    DOB: 1951-05-11, 65 y.o.   MRN: [id number]  HPI: Brittany Hill is a 65 y.o. female  Chief Complaint  Patient presents with  . Hospitalization Follow-up   She was at [facility] and had an L4-5 laminectomy by Dr. Bettey Mare Her diagnoses included spinal stenosis of lumbar region with radiculopathy, M48-.06 and M54.16  She had a potassium of 2.5 on May 3rd, recheck 2.5 on May 4th, and then recheck was 3.2 on May 4th TSH was 0.90 on May 4th  She has had episodes of low potassium over the years I actually checked an aldosterone on April 27th and it was normal  Before surgery, no limitations in breathing or [facility]; has been having fevers, 100.6 still; cough, using inhaler; also doing IS  ------------------------- From [facility] CT CHEST WITH CONTRAST  CLINICAL INFORMATION: ASTHMA, ACUTE UNCOMPLICATED, see CXR from today, compare, no hx cancer or radiation, hx pneumonia in February and COPD, Asthma, J45.20 Mild intermittent asthma, uncomplicated.     PROCEDURE: Routine CT scan of the chest following the administration of 100 mL Isovue 300. 0mL Isovue-300 was wasted.3-D maximal intensity projection (MIPS) reconstructions were performed to potentially increase the sensitivity for the detection of pulmonary nodules. IV contrast was administered to improve disease detection and further define anatomy.  COMPARISON: None  FINDINGS:      Aorta: No aneurysm. No dissection.. standard great vessel branching pattern.    Coronary arteries: Non-gated study limits the evaluation of the coronary arteries. no  evidence of coronary artery calcification.  Pulmonary arteries: nonenlarged. no evidence of central filling defect. Pulmonary veins: four pulmonary vein ostia are present. no evidence of pulmonary  vein  stenosis.  Heart: no cardiac chamber enlargement. no pericardial effusion.  Mediastinum and hilar regions: No enlarged lymph nodes. No masses. Trachea and central airways: patent. Pleural space: no pleural effusion, thickening, or mass.  Lung parenchyma:  *No airspace opacities. No pulmonary nodules.  *Linear opacities within both lungs likely representing atelectasis/scarring.  Axillae: no enlarged axillary lymph nodes. Osseous structures/chest wall: Mild multilevel thoracic degenerative disc disease. Upper abdomen: No abnormalities  IMPRESSION: 1. No acute process.. No evidence for pneumonia. 2.Linear opacities within both lungs likely representing atelectasis (less likely scarring).  The pertinent findings of this examination discussed by telephone  with Valentina Shaggy DENNIS at 12/08/2015 4:11 PM  Electronically Signed by:  Tana Felts, MD Electronically Signed on:  12/08/2015 4:14 PM  --------------------------- She has also had some left leg getting cold and becomes painful; she has to get it warm to make it stop hurting; severe pain at times; feels like sciatica; she has not called surgeon  Depression screen [facility] 2/9 12/16/2015 12/02/2015 07/28/2015  Decreased Interest 0 0 0  Down, Depressed, Hopeless 0 0 0  PHQ - 2 Score 0 0 0   Relevant past medical, surgical, family and social history reviewed Past Medical History  Diagnosis Date  . Fibromyalgia   . MS (multiple sclerosis) (HCC)     hx of long-term corticosteroid use  . Chronic constipation   . OA (osteoarthritis)   . Hypothyroidism   . Osteoporosis 05/2014    managed by Rheumatologist  . IFG (impaired fasting glucose)   . Hypertension   . Hyperlipidemia   . GERD (gastroesophageal  reflux disease)   . Depression   . Anxiety   . TMJ syndrome    Past Surgical History  Procedure Laterality Date  . Abdominal hysterectomy      complete  . Total knee arthroplasty Right 09/2007  . Knee arthroscopy Right     x 2   . Rotator cuff repair    . Lumbarectomy with fusion  [id number]  L4-5 lumbar laminectomy May 2017, [facility]  Family History  Problem Relation Age of Onset  . Cancer Mother     Lymphoma  . Heart disease Mother   . Hypertension Mother   . Stroke Father   . Heart attack Father   . Heart disease Father   . Hypertension Father   . Alcohol abuse Father   . Hypertension Sister   . Cancer Sister     colon  . Hypertension Brother   . Diabetes Brother   . Mental illness Brother   . Depression Brother   . Alcohol abuse Son    Social History  Substance Use Topics  . Smoking status: Never Smoker   . Smokeless tobacco: Never Used  . Alcohol Use: No   Interim medical history since last visit reviewed. Allergies and medications reviewed  Review of Systems Per HPI unless specifically indicated above     Objective:    BP 122/64 mmHg  Pulse 95  Temp(Src) 98.7 F (37.1 C) (Oral)  Resp 14  Wt 202 lb (91.627 kg)  SpO2 98%  Wt Readings from Last 3 Encounters:  12/16/15 202 lb (91.627 kg)  12/02/15 190 lb (86.183 kg)  09/20/15 192 lb 11.2 oz (87.408 kg)    Physical Exam  Constitutional: She appears well-developed and well-nourished. No distress.  HENT:  Head: Normocephalic and atraumatic.  Eyes: EOM are normal. No scleral icterus.  Neck: No thyromegaly present.  Cardiovascular: Normal rate, regular rhythm and normal heart sounds.   No murmur heard. Pulmonary/Chest: Effort normal and breath sounds normal. No respiratory distress. She has no wheezes.  Abdominal: She exhibits no distension.  Musculoskeletal: Normal range of motion. She exhibits no edema.  Surgical incision C/D/I; no fluctuance, no increased warmth or erythema  Neurological: She is alert. She displays no tremor. She exhibits normal muscle tone. Gait (using walker) abnormal.  Discomfort in the back with hip flexion on the left; less discomfort in the back with leg extension on the left  Skin: Skin is warm and dry. She  is not diaphoretic. No pallor.  Psychiatric: She has a normal mood and affect. Her behavior is normal. Judgment and thought content normal. Her mood appears not anxious. She does not exhibit a depressed mood.    Results for orders placed or performed during the hospital encounter of 12/13/15  Basic metabolic panel  Result Value Ref Range   Sodium 136 135 - 145 mmol/L   Potassium 3.5 3.5 - 5.1 mmol/L   Chloride 99 (L) 101 - 111 mmol/L   CO2 27 22 - 32 mmol/L   Glucose, Bld 101 (H) 65 - 99 mg/dL   BUN <5 (L) 6 - 20 mg/dL   Creatinine, Ser 6.38 0.44 - 1.00 mg/dL   Calcium 8.7 (L) 8.9 - 10.3 mg/dL   GFR calc non Af Amer >60 >60 mL/min   GFR calc Af Amer >60 >60 mL/min   Anion gap 10 5 - 15  Magnesium  Result Value Ref Range   Magnesium 2.1 1.7 - 2.4 mg/dL      Assessment & Plan:  Problem List Items Addressed This Visit      Cardiovascular and Mediastinum   Hypertension    Controlled today on single agent; continue        Respiratory   Atelectasis of both lungs - Primary    Noted on chest [facility] at [facility]; continue incentive spirometry; will get f/u CXR since she is having cough and low-grade fevers after surgery; go to ER if worsening      Relevant Orders   DG Chest 2 View     Nervous and Auditory   Left lumbar radiculopathy    Following L4-5 laminectomy; new symptoms; I urged her to contact her neurosurgeon right away; may be inflammation near operative site, other issue        Other   Hypokalemia    Chronic reoccurring issue; normal aldosterone, refer to nephrology; will increase her potassium somewhat, and have her alternate 20 mEq with 40 mEq daily; normal creatinine; reviewed meds, and I see nothing that would drop her K+ (not using albuterol enough to do that I believe)      Relevant Orders   Ambulatory referral to Nephrology      Follow up plan: No Follow-up on file. --> keep already scheduled appt  An after-visit summary was printed and given to the patient  at check-out.  Please see the patient instructions which may contain other information and recommendations beyond what is mentioned above in the assessment and plan.  Meds ordered this encounter  Medications  . oxyCODONE-acetaminophen (PERCOCET/ROXICET) 5-325 MG tablet    Sig: Take by mouth.  . senna-docusate (SENOKOT-S) 8.6-50 MG tablet    Sig: Take by mouth.  . potassium chloride SA (K-DUR,KLOR-CON) 20 MEQ tablet    Sig: Take two pills by mouth every other day, alternating with one pill by mouth every other day    Dispense:  45 tablet    Refill:  1   Orders Placed This Encounter  Procedures  . DG Chest 2 View  . Ambulatory referral to Nephrology

## 2015-12-16 NOTE — Assessment & Plan Note (Signed)
Controlled today on single agent; continue

## 2015-12-16 NOTE — Assessment & Plan Note (Addendum)
Chronic reoccurring issue; normal aldosterone, refer to nephrology; will increase her potassium somewhat, and have her alternate 20 mEq with 40 mEq daily; normal creatinine; reviewed meds, and I see nothing that would drop her K+ (not using albuterol enough to do that I believe)

## 2015-12-19 DIAGNOSIS — M5432 Sciatica, left side: Secondary | ICD-10-CM | POA: Insufficient documentation

## 2015-12-19 DIAGNOSIS — M79605 Pain in left leg: Secondary | ICD-10-CM

## 2016-02-01 ENCOUNTER — Other Ambulatory Visit: Payer: Self-pay | Admitting: Family Medicine

## 2016-02-29 ENCOUNTER — Other Ambulatory Visit: Payer: Self-pay | Admitting: Family Medicine

## 2016-05-01 ENCOUNTER — Other Ambulatory Visit: Payer: Self-pay | Admitting: Family Medicine

## 2016-05-01 ENCOUNTER — Other Ambulatory Visit: Payer: Self-pay

## 2016-05-02 MED ORDER — LEVOTHYROXINE SODIUM 100 MCG PO TABS: 100.0000 ug | ORAL_TABLET | Freq: Every day | ORAL | 2 refills | Status: DC

## 2016-05-02 NOTE — Telephone Encounter (Signed)
Last SGPT reviewed; vytorin requested by pharmacy; Rx sent

## 2016-05-02 NOTE — Telephone Encounter (Signed)
Normal TSH 12/02/15 Rx approved

## 2016-06-01 ENCOUNTER — Other Ambulatory Visit: Payer: Self-pay

## 2016-06-01 ENCOUNTER — Ambulatory Visit: Payer: Medicare Other | Admitting: Family Medicine

## 2016-06-03 MED ORDER — POTASSIUM CHLORIDE CRYS ER 20 MEQ PO TBCR: EXTENDED_RELEASE_TABLET | ORAL | 1 refills | Status: DC

## 2016-06-03 NOTE — Telephone Encounter (Signed)
Last K+ reviewed Rx approved 

## 2016-06-14 ENCOUNTER — Encounter: Payer: Self-pay | Admitting: Family Medicine

## 2016-06-14 ENCOUNTER — Ambulatory Visit (INDEPENDENT_AMBULATORY_CARE_PROVIDER_SITE_OTHER): Payer: Medicare Other | Admitting: Family Medicine

## 2016-06-14 VITALS — BP 118/64 | HR 92 | Temp 98.1°F | Resp 14 | Wt 194.0 lb

## 2016-06-14 DIAGNOSIS — E032 Hypothyroidism due to medicaments and other exogenous substances: Secondary | ICD-10-CM

## 2016-06-14 DIAGNOSIS — M797 Fibromyalgia: Secondary | ICD-10-CM | POA: Diagnosis not present

## 2016-06-14 DIAGNOSIS — M810 Age-related osteoporosis without current pathological fracture: Secondary | ICD-10-CM | POA: Diagnosis not present

## 2016-06-14 DIAGNOSIS — E782 Mixed hyperlipidemia: Secondary | ICD-10-CM | POA: Diagnosis not present

## 2016-06-14 DIAGNOSIS — I10 Essential (primary) hypertension: Secondary | ICD-10-CM

## 2016-06-14 DIAGNOSIS — R7301 Impaired fasting glucose: Secondary | ICD-10-CM

## 2016-06-14 DIAGNOSIS — Z5181 Encounter for therapeutic drug level monitoring: Secondary | ICD-10-CM

## 2016-06-14 LAB — HEMOGLOBIN A1C
Hgb A1c MFr Bld: 5.8 % — ABNORMAL HIGH (ref <5.7)
Mean Plasma Glucose: 120 mg/dL

## 2016-06-14 LAB — COMPLETE METABOLIC PANEL WITH GFR
ALBUMIN: 4.7 g/dL (ref 3.6–5.1)
ALK PHOS: 42 U/L (ref 33–130)
ALT: 18 U/L (ref 6–29)
AST: 24 U/L (ref 10–35)
BILIRUBIN TOTAL: 0.5 mg/dL (ref 0.2–1.2)
BUN: 12 mg/dL (ref 7–25)
CALCIUM: 10.3 mg/dL (ref 8.6–10.4)
CO2: 22 mmol/L (ref 20–31)
Chloride: 102 mmol/L (ref 98–110)
Creat: 0.79 mg/dL (ref 0.50–0.99)
GFR, Est African American: >89 mL/min (ref >=60)
GFR, Est Non African American: 79 mL/min (ref >=60)
Glucose, Bld: 97 mg/dL (ref 65–99)
POTASSIUM: 4.8 mmol/L (ref 3.5–5.3)
Sodium: 140 mmol/L (ref 135–146)
TOTAL PROTEIN: 7.2 g/dL (ref 6.1–8.1)

## 2016-06-14 LAB — CBC WITH DIFFERENTIAL/PLATELET
BASOS PCT: 0 %
Basophils Absolute: 0 cells/uL (ref 0–200)
Eosinophils Absolute: 476 cells/uL (ref 15–500)
Eosinophils Relative: 7 %
HEMATOCRIT: 41.1 % (ref 35.0–45.0)
HEMOGLOBIN: 13.8 g/dL (ref 11.7–15.5)
LYMPHS ABS: 2176 {cells}/uL (ref 850–3900)
Lymphocytes Relative: 32 %
MCH: 29.6 pg (ref 27.0–33.0)
MCHC: 33.6 g/dL (ref 32.0–36.0)
MCV: 88.2 fL (ref 80.0–100.0)
MONO ABS: 680 {cells}/uL (ref 200–950)
MPV: 9.8 fL (ref 7.5–12.5)
Monocytes Relative: 10 %
Neutro Abs: 3468 cells/uL (ref 1500–7800)
Neutrophils Relative %: 51 %
Platelets: 224 10*3/uL (ref 140–400)
RBC: 4.66 MIL/uL (ref 3.80–5.10)
RDW: 14.8 % (ref 11.0–15.0)
WBC: 6.8 10*3/uL (ref 3.8–10.8)

## 2016-06-14 LAB — LIPID PANEL
CHOLESTEROL: 162 mg/dL (ref <200)
HDL: 52 mg/dL (ref >50)
LDL CALC: 79 mg/dL
TRIGLYCERIDES: 153 mg/dL — AB (ref <150)
Total CHOL/HDL Ratio: 3.1 Ratio (ref <5.0)
VLDL: 31 mg/dL — AB (ref <30)

## 2016-06-14 MED ORDER — CYCLOBENZAPRINE HCL 10 MG PO TABS: 10.0000 mg | ORAL_TABLET | Freq: Three times a day (TID) | ORAL | 5 refills | Status: DC | PRN

## 2016-06-14 NOTE — Assessment & Plan Note (Signed)
Okay to continue flexeril, Rx sent

## 2016-06-14 NOTE — Assessment & Plan Note (Signed)
Check lipids; avoid saturated fats 

## 2016-06-14 NOTE — Patient Instructions (Signed)
Your goal blood pressure is less than 150 mmHg on top. Try to follow the DASH guidelines (DASH stands for Dietary Approaches to Stop Hypertension) Try to limit the sodium in your diet.  Ideally, consume less than 1.5 grams (less than 1,500mg ) per day. Do not add salt when cooking or at the table.  Check the sodium amount on labels when shopping, and choose items lower in sodium when given a choice. Avoid or limit foods that already contain a lot of sodium. Eat a diet rich in fruits and vegetables and whole grains. Try to limit saturated fats in your diet (bologna, hot dogs, barbeque, cheeseburgers, hamburgers, steak, bacon, sausage, cheese, etc.) and get more fresh fruits, vegetables, and whole grains We'll get labs today

## 2016-06-14 NOTE — Assessment & Plan Note (Signed)
Check labs 

## 2016-06-14 NOTE — Assessment & Plan Note (Signed)
Well-controlled; continue beta-blocker; limiting salt and processed foods

## 2016-06-14 NOTE — Assessment & Plan Note (Signed)
She has done two Reclast shots; fall precautions; calcium; pt to call DR. [facility] as it's time for another DEXA

## 2016-06-14 NOTE — Assessment & Plan Note (Signed)
Check A1c; limit whites; limit sugary drinks

## 2016-06-14 NOTE — Assessment & Plan Note (Signed)
Last TSH normal; okay to check sprin 2018

## 2016-06-14 NOTE — Progress Notes (Signed)
BP 118/64   Pulse 92   Temp 98.1 F (36.7 C) (Oral)   Resp 14   Wt 194 lb (88 kg)   SpO2 94%   BMI 33.30 kg/m    Subjective:    Patient ID: Brittany Hill, female    DOB: 05-31-1951, 65 y.o.   MRN: [id number]  HPI: Tyrese Week is a 65 y.o. female  Chief Complaint  Patient presents with  . Follow-up   Had surgery on her lower back, Dr. Gearlean Alf; fusion and two pinched nerves; took almost 6 months and then finally turned the corner; not much physical therapy, everything made it hurt worse, just took time; can't walk as long as she used to; getting back into being active; will follow for 2 years; still using flexeril for legs and back; taking aleve just once a day most fo the time  Metoprolol for BP and palpitations; doing well  Hypothyroidism; energy level is stable, confounded by the MS; bowels moving as well as before; eats prunes and drinks prune juice to get them moving if needed  High cholesterol; 2-3 eggs a month; eats [place] franks and [place] bacon once a week, away from pig; steak once a week, hamburgers every 2 weeks; most chicken if any meat  Prediabetes; brother got diabetes from agent orange, no one else; last A1c 5.9; having some blurred vision but thinks it's from the gabapentin; if she skips the 2nd midday dose, that gets better; not MS; just had eye exam 2 months ago; no nocturia  Osteoporosis, last scan 05/2014; patient seeing Dr. Gavin Potters, osteoporosis at spine only, femoral neck osteopenia; weight-bearing activity finally again; calcium intake  Depression screen [facility] 2/9 06/14/2016 12/16/2015 12/02/2015 07/28/2015  Decreased Interest 0 0 0 0  Down, Depressed, Hopeless 0 0 0 0  PHQ - 2 Score 0 0 0 0   Relevant past medical, surgical, family and social history reviewed Past Medical History:  Diagnosis Date  . Anxiety   . Chronic constipation   . Depression   . Fibromyalgia   . GERD (gastroesophageal reflux disease)   . Hyperlipidemia   .  Hypertension   . Hypothyroidism   . IFG (impaired fasting glucose)   . MS (multiple sclerosis) (HCC)    hx of long-term corticosteroid use  . OA (osteoarthritis)   . Osteoporosis 05/2014   managed by Rheumatologist  . TMJ syndrome    Past Surgical History:  Procedure Laterality Date  . ABDOMINAL HYSTERECTOMY     complete  . KNEE ARTHROSCOPY Right    x 2  . lumbarectomy with fusion  [id number]  . ROTATOR CUFF REPAIR    . TOTAL KNEE ARTHROPLASTY Right 09/2007   Family History  Problem Relation Age of Onset  . Cancer Mother     Lymphoma  . Heart disease Mother   . Hypertension Mother   . Stroke Father   . Heart attack Father   . Heart disease Father   . Hypertension Father   . Alcohol abuse Father   . Hypertension Sister   . Cancer Sister     colon  . Hypertension Brother   . Diabetes Brother   . Mental illness Brother   . Depression Brother   . Alcohol abuse Son    Social History  Substance Use Topics  . Smoking status: Never Smoker  . Smokeless tobacco: Never Used  . Alcohol use No   Interim medical history since last visit reviewed. Allergies and medications reviewed  Review of Systems Per HPI unless specifically indicated above     Objective:    BP 118/64   Pulse 92   Temp 98.1 F (36.7 C) (Oral)   Resp 14   Wt 194 lb (88 kg)   SpO2 94%   BMI 33.30 kg/m   Wt Readings from Last 3 Encounters:  06/14/16 194 lb (88 kg)  12/16/15 202 lb (91.6 kg)  12/02/15 190 lb (86.2 kg)    Physical Exam  Constitutional: She appears well-developed and well-nourished. No distress.  HENT:  Head: Normocephalic and atraumatic.  Eyes: EOM are normal. No scleral icterus.  Neck: No thyromegaly present.  Cardiovascular: Normal rate, regular rhythm and normal heart sounds.   No murmur heard. Pulmonary/Chest: Effort normal and breath sounds normal. No respiratory distress. She has no wheezes.  Abdominal: Soft. Bowel sounds are normal. She exhibits no distension.    Musculoskeletal: Normal range of motion. She exhibits no edema.  Neurological: She is alert. She exhibits normal muscle tone.  Skin: Skin is warm and dry. She is not diaphoretic. No pallor.  Psychiatric: She has a normal mood and affect. Her behavior is normal. Judgment and thought content normal.      Assessment & Plan:   Problem List Items Addressed This Visit      Cardiovascular and Mediastinum   Hypertension - Primary (Chronic)    Well-controlled; continue beta-blocker; limiting salt and processed foods        Endocrine   IFG (impaired fasting glucose) (Chronic)    Check A1c; limit whites; limit sugary drinks      Relevant Orders   Hemoglobin A1c (Completed)   Hypothyroidism (Chronic)    Last TSH normal; okay to check sprin 2018        Musculoskeletal and Integument   Osteoporosis (Chronic)    She has done two Reclast shots; fall precautions; calcium; pt to call DR. [facility] as it's time for another DEXA        Other   Medication monitoring encounter    Check labs      Relevant Orders   CBC with Differential/Platelet (Completed)   COMPLETE METABOLIC PANEL WITH GFR (Completed)   Hyperlipidemia (Chronic)    Check lipids; avoid saturated fats      Relevant Orders   Lipid panel (Completed)   Fibromyalgia (Chronic)    Okay to continue flexeril, Rx sent         Follow up plan: Return in about 6 months (around 12/12/2016) for fasting visit and labs.  An after-visit summary was printed and given to the patient at check-out.  Please see the patient instructions which may contain other information and recommendations beyond what is mentioned above in the assessment and plan.  Meds ordered this encounter  Medications  . naproxen sodium (ANAPROX) 220 MG tablet    Sig: Take 220 mg by mouth 2 (two) times daily with a meal.  . cyclobenzaprine (FLEXERIL) 10 MG tablet    Sig: Take 1 tablet (10 mg total) by mouth 3 (three) times daily as needed for muscle spasms.     Dispense:  60 tablet    Refill:  5    Orders Placed This Encounter  Procedures  . CBC with Differential/Platelet  . Lipid panel  . Hemoglobin A1c  . COMPLETE METABOLIC PANEL WITH GFR

## 2016-07-28 ENCOUNTER — Encounter: Payer: Self-pay | Admitting: Family Medicine

## 2016-07-28 ENCOUNTER — Ambulatory Visit (INDEPENDENT_AMBULATORY_CARE_PROVIDER_SITE_OTHER): Payer: Medicare Other | Admitting: Family Medicine

## 2016-07-28 VITALS — BP 110/62 | HR 99 | Temp 98.8°F | Resp 16 | Ht 62.5 in | Wt 194.2 lb

## 2016-07-28 DIAGNOSIS — M810 Age-related osteoporosis without current pathological fracture: Secondary | ICD-10-CM

## 2016-07-28 DIAGNOSIS — Z0000 Encounter for general adult medical examination without abnormal findings: Secondary | ICD-10-CM

## 2016-07-28 DIAGNOSIS — Z1231 Encounter for screening mammogram for malignant neoplasm of breast: Secondary | ICD-10-CM | POA: Diagnosis not present

## 2016-07-28 DIAGNOSIS — R922 Inconclusive mammogram: Secondary | ICD-10-CM

## 2016-07-28 NOTE — Assessment & Plan Note (Signed)
reclast x 2; due for DEXA; three servings of calcium a day; fall precautions

## 2016-07-28 NOTE — Assessment & Plan Note (Signed)
USPSTF grade A and B recommendations reviewed with patient; age-appropriate recommendations, preventive care, screening tests, etc discussed and encouraged; healthy living encouraged; see AVS for patient education given to patient  

## 2016-07-28 NOTE — Progress Notes (Signed)
Patient ID: Brittany Hill, female   DOB: 07-02-1951, 65 y.o.   MRN: [id number]   Subjective:   Brittany Hill is a 65 y.o. female here for a complete physical exam  Interim issues since last visit: personal loss; no medical excitement  USPSTF grade A and B recommendations Alcohol:  no Depression: Depression screen [facility] 2/9 07/28/2016 06/14/2016 12/16/2015 12/02/2015 07/28/2015  Decreased Interest 0 0 0 0 0  Down, Depressed, Hopeless 0 0 0 0 0  PHQ - 2 Score 0 0 0 0 0    Hypertension: excellent control Obesity: lost weight since last 6 months; working on that; twenty pounds Tobacco use:  Never smoker HIV, hep B, hep C: already done STD testing and prevention (chl/gon/syphilis): declined Lipids: LDL 79 in Nov Glucose: better A1c Colorectal cancer: 2014; due in 2019; fam hx of colon cancer (sister) Breast cancer: due, discussed 3D and MRI BRCA gene screening: aunt had HER-2, 2nd episode and died, another aunt with breast cancer; no ovarian cancer Intimate partner violence: no Cervical cancer screening: s/p hysterectomy with everything Lung cancer: never smoker Osteoporosis: Oct 2015; took reclast twice Fall prevention/vitamin D: taking vit D, good fall precautions AAA: no fam hx Aspirin: taking daily Diet: great eater Exercise: active with grandchild Skin cancer: no worrisome moles; had skin cancer check last year, another soon  Past Medical History:  Diagnosis Date  . Anxiety   . Chronic constipation   . Depression   . Fibromyalgia   . GERD (gastroesophageal reflux disease)   . Hyperlipidemia   . Hypertension   . Hypothyroidism   . IFG (impaired fasting glucose)   . MS (multiple sclerosis) (HCC)    hx of long-term corticosteroid use  . OA (osteoarthritis)   . Osteoporosis 05/2014   managed by Rheumatologist  . TMJ syndrome    Past Surgical History:  Procedure Laterality Date  . ABDOMINAL HYSTERECTOMY     complete  . KNEE ARTHROSCOPY Right    x 2  .  lumbarectomy with fusion  [id number]  . ROTATOR CUFF REPAIR    . TOTAL KNEE ARTHROPLASTY Right 09/2007   Family History  Problem Relation Age of Onset  . Cancer Mother     Lymphoma  . Heart disease Mother   . Hypertension Mother   . Stroke Father   . Heart attack Father   . Heart disease Father   . Hypertension Father   . Alcohol abuse Father   . Hypertension Sister   . Cancer Sister     colon  . Hypertension Brother   . Diabetes Brother   . Mental illness Brother   . Depression Brother   . Alcohol abuse Son    Social History  Substance Use Topics  . Smoking status: Never Smoker  . Smokeless tobacco: Never Used  . Alcohol use No   Review of Systems  HENT: Negative for nosebleeds.   Eyes: Negative for visual disturbance.  Respiratory: Negative for wheezing.   Cardiovascular: Negative for chest pain.  Gastrointestinal: Positive for blood in stool (hemorrhoids; chronic constipation, checked out by specialist).  Endocrine: Negative for polydipsia.  Genitourinary: Negative for hematuria.  Allergic/Immunologic: Positive for food allergies (shellfish, has epipen).  Neurological: Negative for tremors.  Hematological: Bruises/bleeds easily (more than usual; on aspirin).    Objective:   Vitals:   07/28/16 1122  BP: 110/62  Pulse: 99  Resp: 16  Temp: 98.8 F (37.1 C)  SpO2: 95%  Weight: 194 lb 4 oz (88.1  kg)  Height: 5' 2.5" (1.588 m)   Body mass index is 34.96 kg/m. Wt Readings from Last 3 Encounters:  07/28/16 194 lb 4 oz (88.1 kg)  06/14/16 194 lb (88 kg)  12/16/15 202 lb (91.6 kg)   Physical Exam  Constitutional: She appears well-developed and well-nourished.  HENT:  Head: Normocephalic and atraumatic.  Right Ear: Hearing, tympanic membrane, external ear and ear canal normal.  Left Ear: Hearing, tympanic membrane, external ear and ear canal normal.  Nose: Nose normal.  Mouth/Throat: Oropharynx is clear and moist. No oropharyngeal exudate.  Eyes:  Conjunctivae and EOM are normal. Right eye exhibits no hordeolum. Left eye exhibits no hordeolum. No scleral icterus.  Neck: Carotid bruit is not present. No thyromegaly present.  Cardiovascular: Normal rate, regular rhythm, S1 normal, S2 normal and normal heart sounds.   No extrasystoles are present.  No murmur heard. Pulmonary/Chest: Effort normal and breath sounds normal. No respiratory distress. Right breast exhibits no inverted nipple, no mass, no nipple discharge, no skin change and no tenderness. Left breast exhibits no inverted nipple, no mass, no nipple discharge, no skin change and no tenderness. Breasts are symmetrical.  Abdominal: Soft. Normal appearance and bowel sounds are normal. She exhibits no distension, no abdominal bruit, no pulsatile midline mass and no mass. There is no hepatosplenomegaly. There is no tenderness. No hernia.  Musculoskeletal: Normal range of motion. She exhibits no edema.  Lymphadenopathy:       Head (right side): No submandibular adenopathy present.       Head (left side): No submandibular adenopathy present.    She has no cervical adenopathy.    She has no axillary adenopathy.  Neurological: She is alert. She displays no tremor. No cranial nerve deficit. She exhibits normal muscle tone. Gait normal.  Reflex Scores:      Patellar reflexes are 2+ on the right side and 2+ on the left side. Skin: Skin is warm and dry. No bruising and no ecchymosis noted. No cyanosis. No pallor.  Psychiatric: Her speech is normal and behavior is normal. Thought content normal. Her mood appears not anxious. She does not exhibit a depressed mood.   Assessment/Plan:   Problem List Items Addressed This Visit      Musculoskeletal and Integument   Osteoporosis (Chronic)    reclast x 2; due for DEXA; three servings of calcium a day; fall precautions      Relevant Orders   DG Bone Density     Other   Screening mammogram, encounter for    Order mammo      Relevant Orders    MM DIGITAL SCREENING BILATERAL   Preventative health care - Primary    USPSTF grade A and B recommendations reviewed with patient; age-appropriate recommendations, preventive care, screening tests, etc discussed and encouraged; healthy living encouraged; see AVS for patient education given to patient      Dense breast tissue on mammogram    Discussed 3D vs MRI; will order 3D      Relevant Orders   MM DIGITAL SCREENING BILATERAL      No orders of the defined types were placed in this encounter.  Orders Placed This Encounter  Procedures  . MM DIGITAL SCREENING BILATERAL    Scheduling Instructions:     Please get 3D mammography; very dense breasts    Order Specific Question:   Reason for Exam (SYMPTOM  OR DIAGNOSIS REQUIRED)    Answer:   screening    Order Specific Question:  Preferred imaging location?    Answer:   [facility]  . DG Bone Density    Order Specific Question:   Reason for Exam (SYMPTOM  OR DIAGNOSIS REQUIRED)    Answer:   osteoporosis, has had reclast    Order Specific Question:   Preferred imaging location?    Answer:   [facility]   Follow up plan: Return in about 1 year (around 07/28/2017) for complete physical OR Medicare Wellness visit.  An after-visit summary was printed and given to the patient at [facility].  Please see the patient instructions which may contain other information and recommendations beyond what is mentioned above in the assessment and plan.

## 2016-07-28 NOTE — Assessment & Plan Note (Signed)
Discussed 3D vs MRI; will order 3D

## 2016-07-28 NOTE — Assessment & Plan Note (Signed)
Order mammo 

## 2016-07-28 NOTE — Patient Instructions (Addendum)
Check out the information at familydoctor.org entitled "Nutrition for Weight Loss: What You Need to Know about Fad Diets" Try to lose between 1-2 pounds per week by taking in fewer calories and burning off more calories You can succeed by limiting portions, limiting foods dense in calories and fat, becoming more active, and drinking 8 glasses of water a day (64 ounces) Don't skip meals, especially breakfast, as skipping meals may alter your metabolism Do not use over-the-counter weight loss pills or gimmicks that claim rapid weight loss A healthy BMI (or body mass index) is between 18.5 and 24.9 You can calculate your ideal BMI at the [place] website [employer]fr  Please do call to schedule your mammogram and bone density test; the number to schedule one at either [facility] or [facility] is [phone]   Osteoporosis Introduction Osteoporosis happens when your bones become thinner and weaker. Weak bones can break (fracture) more easily when you slip or fall. Bones most at risk of breaking are in the hip, wrist, and spine. Follow these instructions at home:  Get enough calcium and vitamin D. These nutrients are good for your bones.  Exercise as told by your doctor.  Do not use any tobacco products. This includes cigarettes, chewing tobacco, and electronic cigarettes. If you need help quitting, ask your doctor.  Limit the amount of alcohol you drink.  Take medicines only as told by your doctor.  Keep all follow-up visits as told by your doctor. This is important.  Take care at home to prevent falls. Some ways to do this are:  Keep rooms well lit and tidy.  Put safety rails on your stairs.  Put a rubber mat in the bathroom and other places that are often wet or slippery. Get help right away if:  You fall.  You hurt yourself. This information is not intended to replace advice given to you by your  health care provider. Make sure you discuss any questions you have with your health care provider. Document Released: 10/16/2011 Document Revised: 12/30/2015 Document Reviewed: 01/01/2014  2017 Elsevier Health Maintenance, Female Introduction Adopting a healthy lifestyle and getting preventive care can go a long way to promote health and wellness. Talk with your health care provider about what schedule of regular examinations is right for you. This is a good chance for you to check in with your provider about disease prevention and staying healthy. In between checkups, there are plenty of things you can do on your own. Experts have done a lot of research about which lifestyle changes and preventive measures are most likely to keep you healthy. Ask your health care provider for more information. Weight and diet Eat a healthy diet  Be sure to include plenty of vegetables, fruits, low-fat dairy products, and lean protein.  Do not eat a lot of foods high in solid fats, added sugars, or salt.  Get regular exercise. This is one of the most important things you can do for your health.  Most adults should exercise for at least 150 minutes each week. The exercise should increase your heart rate and make you sweat (moderate-intensity exercise).  Most adults should also do strengthening exercises at least twice a week. This is in addition to the moderate-intensity exercise. Maintain a healthy weight  Body mass index (BMI) is a measurement that can be used to identify possible weight problems. It estimates body fat based on height and weight. Your health care provider can help determine your BMI and help  you achieve or maintain a healthy weight.  For females 42 years of age and older:  A BMI below 18.5 is considered underweight.  A BMI of 18.5 to 24.9 is normal.  A BMI of 25 to 29.9 is considered overweight.  A BMI of 30 and above is considered obese. Watch levels of cholesterol and blood  lipids  You should start having your blood tested for lipids and cholesterol at 65 years of age, then have this test every 5 years.  You may need to have your cholesterol levels checked more often if:  Your lipid or cholesterol levels are high.  You are older than 65 years of age.  You are at high risk for heart disease. Cancer screening Lung Cancer  Lung cancer screening is recommended for adults 5-71 years old who are at high risk for lung cancer because of a history of smoking.  A yearly low-dose CT scan of the lungs is recommended for people who:  Currently smoke.  Have quit within the past 15 years.  Have at least a 30-pack-year history of smoking. A pack year is smoking an average of one pack of cigarettes a day for 1 year.  Yearly screening should continue until it has been 15 years since you quit.  Yearly screening should stop if you develop a health problem that would prevent you from having lung cancer treatment. Breast Cancer  Practice breast self-awareness. This means understanding how your breasts normally appear and feel.  It also means doing regular breast self-exams. Let your health care provider know about any changes, no matter how small.  If you are in your 20s or 30s, you should have a clinical breast exam (CBE) by a health care provider every 1-3 years as part of a regular health exam.  If you are 9 or older, have a CBE every year. Also consider having a breast X-ray (mammogram) every year.  If you have a family history of breast cancer, talk to your health care provider about genetic screening.  If you are at high risk for breast cancer, talk to your health care provider about having an MRI and a mammogram every year.  Breast cancer gene (BRCA) assessment is recommended for women who have family members with BRCA-related cancers. BRCA-related cancers include:  Breast.  Ovarian.  Tubal.  Peritoneal cancers.  Results of the assessment will  determine the need for genetic counseling and BRCA1 and BRCA2 testing. Cervical Cancer  Your health care provider may recommend that you be screened regularly for cancer of the pelvic organs (ovaries, uterus, and vagina). This screening involves a pelvic examination, including checking for microscopic changes to the surface of your cervix (Pap test). You may be encouraged to have this screening done every 3 years, beginning at age 70.  For women ages 39-65, health care providers may recommend pelvic exams and Pap testing every 3 years, or they may recommend the Pap and pelvic exam, combined with testing for human papilloma virus (HPV), every 5 years. Some types of HPV increase your risk of cervical cancer. Testing for HPV may also be done on women of any age with unclear Pap test results.  Other health care providers may not recommend any screening for nonpregnant women who are considered low risk for pelvic cancer and who do not have symptoms. Ask your health care provider if a screening pelvic exam is right for you.  If you have had past treatment for cervical cancer or a condition that could lead  to cancer, you need Pap tests and screening for cancer for at least 20 years after your treatment. If Pap tests have been discontinued, your risk factors (such as having a new sexual partner) need to be reassessed to determine if screening should resume. Some women have medical problems that increase the chance of getting cervical cancer. In these cases, your health care provider may recommend more frequent screening and Pap tests. Colorectal Cancer  This type of cancer can be detected and often prevented.  Routine colorectal cancer screening usually begins at 65 years of age and continues through 65 years of age.  Your health care provider may recommend screening at an earlier age if you have risk factors for colon cancer.  Your health care provider may also recommend using home test kits to check for  hidden blood in the stool.  A small camera at the end of a tube can be used to examine your colon directly (sigmoidoscopy or colonoscopy). This is done to check for the earliest forms of colorectal cancer.  Routine screening usually begins at age 59.  Direct examination of the colon should be repeated every 5-10 years through 65 years of age. However, you may need to be screened more often if early forms of precancerous polyps or small growths are found. Skin Cancer  Check your skin from head to toe regularly.  Tell your health care provider about any new moles or changes in moles, especially if there is a change in a mole's shape or color.  Also tell your health care provider if you have a mole that is larger than the size of a pencil eraser.  Always use sunscreen. Apply sunscreen liberally and repeatedly throughout the day.  Protect yourself by wearing long sleeves, pants, a wide-brimmed hat, and sunglasses whenever you are outside. Heart disease, diabetes, and high blood pressure  High blood pressure causes heart disease and increases the risk of stroke. High blood pressure is more likely to develop in:  People who have blood pressure in the high end of the normal range (130-139/85-89 mm Hg).  People who are overweight or obese.  People who are African American.  If you are 50-40 years of age, have your blood pressure checked every 3-5 years. If you are 96 years of age or older, have your blood pressure checked every year. You should have your blood pressure measured twice-once when you are at a hospital or clinic, and once when you are not at a hospital or clinic. Record the average of the two measurements. To check your blood pressure when you are not at a hospital or clinic, you can use:  An automated blood pressure machine at a pharmacy.  A home blood pressure monitor.  If you are between 2 years and 48 years old, ask your health care provider if you should take aspirin to  prevent strokes.  Have regular diabetes screenings. This involves taking a blood sample to check your fasting blood sugar level.  If you are at a normal weight and have a low risk for diabetes, have this test once every three years after 65 years of age.  If you are overweight and have a high risk for diabetes, consider being tested at a younger age or more often. Preventing infection Hepatitis B  If you have a higher risk for hepatitis B, you should be screened for this virus. You are considered at high risk for hepatitis B if:  You were born in a country where hepatitis  B is common. Ask your health care provider which countries are considered high risk.  Your parents were born in a high-risk country, and you have not been immunized against hepatitis B (hepatitis B vaccine).  You have HIV or AIDS.  You use needles to inject street drugs.  You live with someone who has hepatitis B.  You have had sex with someone who has hepatitis B.  You get hemodialysis treatment.  You take certain medicines for conditions, including cancer, organ transplantation, and autoimmune conditions. Hepatitis C  Blood testing is recommended for:  Everyone born from [birth year] through 1965.  Anyone with known risk factors for hepatitis C. Sexually transmitted infections (STIs)  You should be screened for sexually transmitted infections (STIs) including gonorrhea and chlamydia if:  You are sexually active and are younger than 65 years of age.  You are older than 65 years of age and your health care provider tells you that you are at risk for this type of infection.  Your sexual activity has changed since you were last screened and you are at an increased risk for chlamydia or gonorrhea. Ask your health care provider if you are at risk.  If you do not have HIV, but are at risk, it may be recommended that you take a prescription medicine daily to prevent HIV infection. This is called pre-exposure  prophylaxis (PrEP). You are considered at risk if:  You are sexually active and do not regularly use condoms or know the HIV status of your partner(s).  You take drugs by injection.  You are sexually active with a partner who has HIV. Talk with your health care provider about whether you are at high risk of being infected with HIV. If you choose to begin PrEP, you should first be tested for HIV. You should then be tested every 3 months for as long as you are taking PrEP. Pregnancy  If you are premenopausal and you may become pregnant, ask your health care provider about preconception counseling.  If you may become pregnant, take 400 to 800 micrograms (mcg) of folic acid every day.  If you want to prevent pregnancy, talk to your health care provider about birth control (contraception). Osteoporosis and menopause  Osteoporosis is a disease in which the bones lose minerals and strength with aging. This can result in serious bone fractures. Your risk for osteoporosis can be identified using a bone density scan.  If you are 32 years of age or older, or if you are at risk for osteoporosis and fractures, ask your health care provider if you should be screened.  Ask your health care provider whether you should take a calcium or vitamin D supplement to lower your risk for osteoporosis.  Menopause may have certain physical symptoms and risks.  Hormone replacement therapy may reduce some of these symptoms and risks. Talk to your health care provider about whether hormone replacement therapy is right for you. Follow these instructions at home:  Schedule regular health, dental, and eye exams.  Stay current with your immunizations.  Do not use any tobacco products including cigarettes, chewing tobacco, or electronic cigarettes.  If you are pregnant, do not drink alcohol.  If you are breastfeeding, limit how much and how often you drink alcohol.  Limit alcohol intake to no more than 1 drink  per day for nonpregnant women. One drink equals 12 ounces of beer, 5 ounces of wine, or 1 ounces of hard liquor.  Do not use street drugs.  Do not  share needles.  Ask your health care provider for help if you need support or information about quitting drugs.  Tell your health care provider if you often feel depressed.  Tell your health care provider if you have ever been abused or do not feel safe at home. This information is not intended to replace advice given to you by your health care provider. Make sure you discuss any questions you have with your health care provider. Document Released: 02/06/2011 Document Revised: 12/30/2015 Document Reviewed: 04/27/2015  2017 Elsevier

## 2016-08-03 ENCOUNTER — Other Ambulatory Visit: Payer: Self-pay | Admitting: Family Medicine

## 2016-08-09 NOTE — Telephone Encounter (Signed)
Glitch in our system; I was not receiving electronic refill requests from Dec 13 until yesterday; addressed with IT; addressing now ------------------------------------ Labs reviewed from Jun 14, 2016 Rxs approved

## 2016-09-05 ENCOUNTER — Other Ambulatory Visit: Payer: Self-pay | Admitting: Family Medicine

## 2016-09-06 ENCOUNTER — Other Ambulatory Visit: Payer: Self-pay

## 2016-09-06 NOTE — Telephone Encounter (Signed)
Addressed in another phone note; need clarification:  potassium chloride SA (K-DUR,KLOR-CON) 20 MEQ tablet  1 ordered  EditCancel Reorder       Patient not taking. Reported on 07/28/2016

## 2016-09-06 NOTE — Telephone Encounter (Signed)
Please verify potassium rx with patient Our med list documentation shows that she was not taking it at the time of her appointment on 07/28/16 Is that true? I just need to know if this is something she no longer needs or if it was an automatic refill or what Thank you

## 2016-09-07 ENCOUNTER — Telehealth: Payer: Self-pay | Admitting: Family Medicine

## 2016-09-07 NOTE — Telephone Encounter (Signed)
Please return patients call. There seems to be some confusion about her potassium medication. Please call her pt.

## 2016-09-07 NOTE — Telephone Encounter (Signed)
Yes she did say she didn't use them anymore. When I called her she stated that she decided she need to not stop them and went to the pharmacy and asked for a refill. Patient stated before she stopped them she was taking it potassium one a day. She also need her calcium tablet refill as well. [facility].

## 2016-09-07 NOTE — Telephone Encounter (Signed)
Thank you I updated the potassium to say just once a day and sent that Rx Calcium is over-the-counter and not on her med list anyway, so I won't refill that; do please update her med list though with the calcium; thank you

## 2016-09-08 ENCOUNTER — Telehealth: Payer: Self-pay

## 2016-09-08 NOTE — Progress Notes (Unsigned)
Updated Medication

## 2016-09-21 ENCOUNTER — Ambulatory Visit
Admission: RE | Admit: 2016-09-21 | Discharge: 2016-09-21 | Disposition: A | Discharge status: Home / Self Care | Payer: Medicare Other | Source: Ambulatory Visit | Attending: Family Medicine | Admitting: Family Medicine

## 2016-09-21 DIAGNOSIS — Z1231 Encounter for screening mammogram for malignant neoplasm of breast: Secondary | ICD-10-CM | POA: Diagnosis not present

## 2016-09-21 DIAGNOSIS — M810 Age-related osteoporosis without current pathological fracture: Secondary | ICD-10-CM | POA: Diagnosis present

## 2016-10-09 ENCOUNTER — Other Ambulatory Visit: Payer: Self-pay | Admitting: Family Medicine

## 2016-10-26 ENCOUNTER — Ambulatory Visit: Payer: Medicare Other | Admitting: Family Medicine

## 2016-10-27 ENCOUNTER — Ambulatory Visit (INDEPENDENT_AMBULATORY_CARE_PROVIDER_SITE_OTHER): Payer: Medicare Other | Admitting: Family Medicine

## 2016-10-27 ENCOUNTER — Encounter: Payer: Self-pay | Admitting: Family Medicine

## 2016-10-27 VITALS — BP 124/78 | HR 97 | Temp 98.4°F | Resp 14 | Wt 190.3 lb

## 2016-10-27 DIAGNOSIS — B373 Candidiasis of vulva and vagina: Secondary | ICD-10-CM | POA: Diagnosis not present

## 2016-10-27 DIAGNOSIS — J0100 Acute maxillary sinusitis, unspecified: Secondary | ICD-10-CM

## 2016-10-27 DIAGNOSIS — E782 Mixed hyperlipidemia: Secondary | ICD-10-CM | POA: Diagnosis not present

## 2016-10-27 DIAGNOSIS — B3731 Acute candidiasis of vulva and vagina: Secondary | ICD-10-CM

## 2016-10-27 MED ORDER — FLUCONAZOLE 150 MG PO TABS: 150.0000 mg | ORAL_TABLET | Freq: Once | ORAL | 0 refills | Status: AC

## 2016-10-27 MED ORDER — AZITHROMYCIN 250 MG PO TABS: ORAL_TABLET | ORAL | 0 refills | Status: AC

## 2016-10-27 NOTE — Patient Instructions (Signed)
Try vitamin C (orange juice if not diabetic or vitamin C tablets) and drink green tea to help your immune system during your illness Get plenty of rest and hydration Please do eat yogurt daily or take a probiotic daily for the next 3-4 weeks We want to replace the healthy germs in the gut If you notice foul, watery diarrhea in the next two months, schedule an appointment RIGHT AWAY Don't take your cholesterol medicine for 3 days

## 2016-10-27 NOTE — Progress Notes (Signed)
BP 124/78   Pulse 97   Temp 98.4 F (36.9 C) (Oral)   Resp 14   Wt 190 lb 4.8 oz (86.3 kg)   SpO2 98%   BMI 34.25 kg/m    Subjective:    Patient ID: Brittany Hill, female    DOB: 1950-12-04, 66 y.o.   MRN: [id number]  HPI: Brittany Hill is a 66 y.o. female  Chief Complaint  Patient presents with  . Sore Throat    swollen glands   Patient is here for an acute visit; she got sick about 8 days ago Fever of 102 for 3 days; that broke Started all of a sudden with headache, body aches, sore throat No travel; exposure to day care Flu shot this year; this was nothing as bad as the flu she  Swollen glands and green mess and gunk has developed Was allergic once to sulfonamides, but she's not sure Amoxicillin does not work as well She has used Human resources officer and tylenol and ibuprofen She also has a yeast infection down below Takes cholesterol medicine (vytorin) Last labs reviewed from Nov 2017  Depression screen [facility] 2/9 10/27/2016 07/28/2016 06/14/2016 12/16/2015 12/02/2015  Decreased Interest 0 0 0 0 0  Down, Depressed, Hopeless 0 0 0 0 0  PHQ - 2 Score 0 0 0 0 0   Relevant past medical, surgical, family and social history reviewed Past Medical History:  Diagnosis Date  . Anxiety   . Chronic constipation   . Depression   . Fibromyalgia   . GERD (gastroesophageal reflux disease)   . Hyperlipidemia   . Hypertension   . Hypothyroidism   . IFG (impaired fasting glucose)   . MS (multiple sclerosis) (HCC)    hx of long-term corticosteroid use  . OA (osteoarthritis)   . Osteoporosis 05/2014   managed by Rheumatologist  . TMJ syndrome    Past Surgical History:  Procedure Laterality Date  . ABDOMINAL HYSTERECTOMY     complete  . KNEE ARTHROSCOPY Right    x 2  . lumbarectomy with fusion  [id number]  . ROTATOR CUFF REPAIR    . TOTAL KNEE ARTHROPLASTY Right 09/2007   Social History  Substance Use Topics  . Smoking status: Never Smoker  . Smokeless tobacco: Never Used   . Alcohol use No   Interim medical history since last visit reviewed. Allergies and medications reviewed  Review of Systems Per HPI unless specifically indicated above     Objective:    BP 124/78   Pulse 97   Temp 98.4 F (36.9 C) (Oral)   Resp 14   Wt 190 lb 4.8 oz (86.3 kg)   SpO2 98%   BMI 34.25 kg/m   Wt Readings from Last 3 Encounters:  10/27/16 190 lb 4.8 oz (86.3 kg)  07/28/16 194 lb 4 oz (88.1 kg)  06/14/16 194 lb (88 kg)    Physical Exam  Constitutional: She appears well-developed and well-nourished. No distress.  HENT:  Right Ear: Hearing, tympanic membrane, external ear and ear canal normal.  Left Ear: Hearing, tympanic membrane, external ear and ear canal normal.  Nose: Rhinorrhea present. Right sinus exhibits maxillary sinus tenderness. Right sinus exhibits no frontal sinus tenderness. Left sinus exhibits maxillary sinus tenderness. Left sinus exhibits no frontal sinus tenderness.  Mouth/Throat: Oropharynx is clear and moist and mucous membranes are normal.  Eyes: EOM are normal. No scleral icterus.  Neck: No thyromegaly present.  Cardiovascular: Normal rate and regular rhythm.   Pulmonary/Chest: Effort  normal and breath sounds normal. No respiratory distress. She has no wheezes.  Abdominal: She exhibits no distension.  Lymphadenopathy:    She has cervical adenopathy (shoddy).  Skin: She is not diaphoretic. No pallor.  Psychiatric: She has a normal mood and affect. Her behavior is normal. Judgment and thought content normal.   Results for orders placed or performed in visit on 06/14/16  CBC with Differential/Platelet  Result Value Ref Range   WBC 6.8 3.8 - 10.8 K/uL   RBC 4.66 3.80 - 5.10 MIL/uL   Hemoglobin 13.8 11.7 - 15.5 g/dL   HCT 41.1 35.0 - 45.0 %   MCV 88.2 80.0 - 100.0 fL   MCH 29.6 27.0 - 33.0 pg   MCHC 33.6 32.0 - 36.0 g/dL   RDW 14.8 11.0 - 15.0 %   Platelets 224 140 - 400 K/uL   MPV 9.8 7.5 - 12.5 fL   Neutro Abs 3,468 1,500 - 7,800  cells/uL   Lymphs Abs 2,176 850 - 3,900 cells/uL   Monocytes Absolute 680 200 - 950 cells/uL   Eosinophils Absolute 476 15 - 500 cells/uL   Basophils Absolute 0 0 - 200 cells/uL   Neutrophils Relative % 51 %   Lymphocytes Relative 32 %   Monocytes Relative 10 %   Eosinophils Relative 7 %   Basophils Relative 0 %   Smear Review Criteria for review not met   Lipid panel  Result Value Ref Range   Cholesterol 162 <200 mg/dL   Triglycerides 153 (H) <150 mg/dL   HDL 52 >50 mg/dL   Total CHOL/HDL Ratio 3.1 <5.0 Ratio   VLDL 31 (H) <30 mg/dL   LDL Cholesterol 79 mg/dL  Hemoglobin A1c  Result Value Ref Range   Hgb A1c MFr Bld 5.8 (H) <5.7 %   Mean Plasma Glucose 120 mg/dL  COMPLETE METABOLIC PANEL WITH GFR  Result Value Ref Range   Sodium 140 135 - 146 mmol/L   Potassium 4.8 3.5 - 5.3 mmol/L   Chloride 102 98 - 110 mmol/L   CO2 22 20 - 31 mmol/L   Glucose, Bld 97 65 - 99 mg/dL   BUN 12 7 - 25 mg/dL   Creat 0.79 0.50 - 0.99 mg/dL   Total Bilirubin 0.5 0.2 - 1.2 mg/dL   Alkaline Phosphatase 42 33 - 130 U/L   AST 24 10 - 35 U/L   ALT 18 6 - 29 U/L   Total Protein 7.2 6.1 - 8.1 g/dL   Albumin 4.7 3.6 - 5.1 g/dL   Calcium 10.3 8.6 - 10.4 mg/dL   GFR, Est African American >89 >=60 mL/min   GFR, Est Non African American 79 >=60 mL/min      Assessment & Plan:   Problem List Items Addressed This Visit      Other   Hyperlipidemia (Chronic)    On statin combo; do not take statin combo for three days after taking diflucan       Other Visit Diagnoses    Acute non-recurrent maxillary sinusitis    -  Primary   sounds like secondary infection after what may have been flu; antibiotics, rest, hydration, see AVS; yogurt or probiotics to decrease risk of C diff   Relevant Medications   fluconazole (DIFLUCAN) 150 MG tablet   azithromycin (ZITHROMAX) 250 MG tablet   Vaginal candidiasis       diflucan prescribed; do not take statin combo for 3 days   Relevant Medications   fluconazole  (DIFLUCAN) 150  MG tablet   azithromycin (ZITHROMAX) 250 MG tablet      Follow up plan: No Follow-up on file.  An after-visit summary was printed and given to the patient at [place].  Please see the patient instructions which may contain other information and recommendations beyond what is mentioned above in the assessment and plan.  Meds ordered this encounter  Medications  . fluconazole (DIFLUCAN) 150 MG tablet    Sig: Take 1 tablet (150 mg total) by mouth once. No cholesterol medicine for 3 days    Dispense:  1 tablet    Refill:  0  . azithromycin (ZITHROMAX) 250 MG tablet    Sig: Take two pills by mouth today, and then one pill daily for four more days    Dispense:  6 tablet    Refill:  0    No orders of the defined types were placed in this encounter.

## 2016-10-27 NOTE — Assessment & Plan Note (Signed)
On statin combo; do not take statin combo for three days after taking diflucan

## 2017-01-25 ENCOUNTER — Other Ambulatory Visit: Payer: Self-pay | Admitting: Family Medicine

## 2017-01-25 NOTE — Telephone Encounter (Signed)
Please ask patient if she'll make an appt in the next month for her medical problems (cholesterol, etc.); come in fasting for labs I'll refill medicine in the meantime We look forward to seeing her soon

## 2017-03-01 ENCOUNTER — Other Ambulatory Visit: Payer: Self-pay

## 2017-03-01 DIAGNOSIS — Z5181 Encounter for therapeutic drug level monitoring: Secondary | ICD-10-CM

## 2017-03-01 DIAGNOSIS — I10 Essential (primary) hypertension: Secondary | ICD-10-CM

## 2017-03-01 DIAGNOSIS — E032 Hypothyroidism due to medicaments and other exogenous substances: Secondary | ICD-10-CM

## 2017-03-01 DIAGNOSIS — E782 Mixed hyperlipidemia: Secondary | ICD-10-CM

## 2017-03-01 DIAGNOSIS — R7301 Impaired fasting glucose: Secondary | ICD-10-CM

## 2017-03-19 ENCOUNTER — Other Ambulatory Visit: Payer: Self-pay | Admitting: Family Medicine

## 2017-03-19 NOTE — Telephone Encounter (Signed)
Please remind pt about lab orders. I sent Rx for potassium.

## 2017-03-19 NOTE — Telephone Encounter (Signed)
Left a detail messaged  

## 2017-03-21 ENCOUNTER — Other Ambulatory Visit: Payer: Self-pay | Admitting: Family Medicine

## 2017-03-21 ENCOUNTER — Other Ambulatory Visit: Payer: Self-pay

## 2017-03-21 DIAGNOSIS — Z79899 Other long term (current) drug therapy: Secondary | ICD-10-CM

## 2017-03-21 DIAGNOSIS — M810 Age-related osteoporosis without current pathological fracture: Secondary | ICD-10-CM

## 2017-03-21 LAB — TSH: TSH: 1.25 mIU/L

## 2017-03-22 LAB — BASIC METABOLIC PANEL WITH GFR
BUN: 14 mg/dL (ref 7–25)
CALCIUM: 9.9 mg/dL (ref 8.6–10.4)
CO2: 22 mmol/L (ref 20–32)
CREATININE: 0.75 mg/dL (ref 0.50–0.99)
Chloride: 103 mmol/L (ref 98–110)
GFR, EST NON AFRICAN AMERICAN: 84 mL/min (ref >=60)
GFR, Est African American: >89 mL/min (ref >=60)
GLUCOSE: 96 mg/dL (ref 65–99)
Potassium: 4.5 mmol/L (ref 3.5–5.3)
Sodium: 139 mmol/L (ref 135–146)

## 2017-03-22 LAB — LIPID PANEL
Cholesterol: 168 mg/dL (ref <200)
HDL: 55 mg/dL (ref >50)
LDL CALC: 85 mg/dL (ref <100)
Total CHOL/HDL Ratio: 3.1 Ratio (ref <5.0)
Triglycerides: 139 mg/dL (ref <150)
VLDL: 28 mg/dL (ref <30)

## 2017-03-22 LAB — HEMOGLOBIN A1C
HEMOGLOBIN A1C: 5.9 % — AB (ref <5.7)
Mean Plasma Glucose: 123 mg/dL

## 2017-03-22 LAB — VITAMIN D 25 HYDROXY (VIT D DEFICIENCY, FRACTURES): VIT D 25 HYDROXY: 36 ng/mL (ref 30–100)

## 2017-04-16 ENCOUNTER — Other Ambulatory Visit: Payer: Self-pay | Admitting: Family Medicine

## 2017-04-16 ENCOUNTER — Other Ambulatory Visit: Payer: Self-pay

## 2017-04-16 DIAGNOSIS — F32A Depression, unspecified: Secondary | ICD-10-CM

## 2017-04-16 DIAGNOSIS — E782 Mixed hyperlipidemia: Secondary | ICD-10-CM

## 2017-04-16 DIAGNOSIS — E032 Hypothyroidism due to medicaments and other exogenous substances: Secondary | ICD-10-CM

## 2017-04-16 DIAGNOSIS — F329 Major depressive disorder, single episode, unspecified: Secondary | ICD-10-CM

## 2017-04-16 DIAGNOSIS — F419 Anxiety disorder, unspecified: Secondary | ICD-10-CM

## 2017-04-16 MED ORDER — SERTRALINE HCL 100 MG PO TABS: 100.0000 mg | ORAL_TABLET | Freq: Every day | ORAL | 0 refills | Status: DC

## 2017-04-16 MED ORDER — LEVOTHYROXINE SODIUM 100 MCG PO TABS: ORAL_TABLET | ORAL | 0 refills | Status: DC

## 2017-04-16 MED ORDER — EZETIMIBE-SIMVASTATIN 10-40 MG PO TABS: ORAL_TABLET | ORAL | 0 refills | Status: DC

## 2017-04-16 NOTE — Telephone Encounter (Signed)
TSH, CMP, and Lipid Panel reviewed from 03/21/2017 reviewed - all were WNL, Dr. Sherie Don did review these labs and instructed patient to stay the course with her medications, so refills are provided.

## 2017-04-17 NOTE — Telephone Encounter (Signed)
ezetimibe-simvastatin (VYTORIN) 10-40 MG tablet but it was taking care of by you. SO that's all she need a refill on.

## 2017-04-17 NOTE — Telephone Encounter (Signed)
No medication was listed for refill. Please call pharmacy to clarify if there is a needed refill. Thanks!

## 2017-05-08 ENCOUNTER — Other Ambulatory Visit: Payer: Self-pay | Admitting: Family Medicine

## 2017-05-16 ENCOUNTER — Telehealth: Payer: Self-pay | Admitting: Family Medicine

## 2017-06-06 ENCOUNTER — Other Ambulatory Visit: Payer: Self-pay | Admitting: Family Medicine

## 2017-07-23 ENCOUNTER — Other Ambulatory Visit: Payer: Self-pay | Admitting: Family Medicine

## 2017-07-23 ENCOUNTER — Other Ambulatory Visit: Payer: Self-pay

## 2017-07-23 DIAGNOSIS — E782 Mixed hyperlipidemia: Secondary | ICD-10-CM

## 2017-07-23 DIAGNOSIS — E032 Hypothyroidism due to medicaments and other exogenous substances: Secondary | ICD-10-CM

## 2017-07-23 DIAGNOSIS — F419 Anxiety disorder, unspecified: Secondary | ICD-10-CM

## 2017-07-23 DIAGNOSIS — F329 Major depressive disorder, single episode, unspecified: Secondary | ICD-10-CM

## 2017-07-23 DIAGNOSIS — F32A Depression, unspecified: Secondary | ICD-10-CM

## 2017-07-23 MED ORDER — LEVOTHYROXINE SODIUM 100 MCG PO TABS: ORAL_TABLET | ORAL | 2 refills | Status: DC

## 2017-07-23 MED ORDER — EZETIMIBE-SIMVASTATIN 10-40 MG PO TABS: ORAL_TABLET | ORAL | 1 refills | Status: DC

## 2017-07-23 MED ORDER — SERTRALINE HCL 100 MG PO TABS: 100.0000 mg | ORAL_TABLET | Freq: Every day | ORAL | 2 refills | Status: DC

## 2017-07-23 NOTE — Telephone Encounter (Signed)
Lab Results  Component Value Date   TSH 1.25 03/21/2017

## 2017-07-25 LAB — HM DIABETES EYE EXAM

## 2017-08-02 ENCOUNTER — Encounter: Payer: Self-pay | Admitting: Family Medicine

## 2017-08-02 ENCOUNTER — Ambulatory Visit (INDEPENDENT_AMBULATORY_CARE_PROVIDER_SITE_OTHER): Payer: Medicare Other | Admitting: Family Medicine

## 2017-08-02 VITALS — BP 148/80 | HR 79 | Temp 98.7°F | Resp 14 | Ht 64.0 in | Wt 195.0 lb

## 2017-08-02 DIAGNOSIS — F419 Anxiety disorder, unspecified: Secondary | ICD-10-CM | POA: Diagnosis not present

## 2017-08-02 DIAGNOSIS — Z1239 Encounter for other screening for malignant neoplasm of breast: Secondary | ICD-10-CM

## 2017-08-02 DIAGNOSIS — N63 Unspecified lump in unspecified breast: Secondary | ICD-10-CM

## 2017-08-02 DIAGNOSIS — F329 Major depressive disorder, single episode, unspecified: Secondary | ICD-10-CM | POA: Diagnosis not present

## 2017-08-02 DIAGNOSIS — Z23 Encounter for immunization: Secondary | ICD-10-CM | POA: Diagnosis not present

## 2017-08-02 DIAGNOSIS — Z803 Family history of malignant neoplasm of breast: Secondary | ICD-10-CM

## 2017-08-02 DIAGNOSIS — Z789 Other specified health status: Secondary | ICD-10-CM | POA: Diagnosis not present

## 2017-08-02 DIAGNOSIS — F32A Depression, unspecified: Secondary | ICD-10-CM

## 2017-08-02 DIAGNOSIS — Z0000 Encounter for general adult medical examination without abnormal findings: Secondary | ICD-10-CM | POA: Diagnosis not present

## 2017-08-02 HISTORY — DX: Other specified health status: Z78.9

## 2017-08-02 MED ORDER — SERTRALINE HCL 100 MG PO TABS: 150.0000 mg | ORAL_TABLET | Freq: Every day | ORAL | 1 refills | Status: DC

## 2017-08-02 NOTE — Progress Notes (Signed)
Patient: Brittany Hill, Female    DOB: February 28, 1951, 66 y.o.   MRN: [id number]  Visit Date: 08/02/2017  Today's Provider: Enid Derry, MD   Chief Complaint  Patient presents with  . Medicare Wellness    Subjective:   Brittany Hill is a 66 y.o. female who presents today for her Subsequent Annual Wellness Visit. No medical excitement Went to the eye doctor last week, cataracts in both eyes; mature; Feb 21 and March 21 surgery dates Asked about handicapped sticker; related to the MS BP up a little today; checks BP away from here; under 702 systolic at home; she'll watch it Gets flexeril for back pain  USPSTF grade A and B recommendations Depression:  Depression screen [facility] 2/9 08/02/2017 10/27/2016 07/28/2016 06/14/2016 12/16/2015  Decreased Interest 1 0 0 0 0  Down, Depressed, Hopeless 3 0 0 0 0  PHQ - 2 Score 4 0 0 0 0  Altered sleeping 2 - - - -  Tired, decreased energy 3 - - - -  Change in appetite 2 - - - -  Feeling bad or failure about yourself  1 - - - -  Trouble concentrating 2 - - - -  Moving slowly or fidgety/restless 0 - - - -  Suicidal thoughts 0 - - - -  PHQ-9 Score 14 - - - -  Difficult doing work/chores Somewhat difficult - - - -  was so sick with sinus infections during December; stress and holidays; no SI/HI; asked if she'll be okay, versus increasing or changing antidepressant Hypertension: BP Readings from Last 3 Encounters:  08/02/17 (!) 148/80  10/27/16 124/78  07/28/16 110/62   Obesity: Wt Readings from Last 3 Encounters:  08/02/17 195 lb (88.5 kg)  10/27/16 190 lb 4.8 oz (86.3 kg)  07/28/16 194 lb 4 oz (88.1 kg)   BMI Readings from Last 3 Encounters:  08/02/17 33.47 kg/m  10/27/16 34.25 kg/m  07/28/16 34.96 kg/m    Skin cancer: no worrisome moles; dermatologist zapped spot on the chest one year ago, developed thickened scar tissue; she removed it and it was scar; now thick still, and she'll see derm in January; no other worrisome  moles Lung cancer:  Never smoker Breast cancer: mammo done Feb 2018; UTD; no lumps or bumps Colorectal cancer: done July 2014; due in July 2019 BRCA gene screening: family hx of breast and/or ovarian cancer and/or metastatic prostate cancer? Yes, breast cancer (aunt died from HER-2, another aunt is on medicine for life for breast cancer); niece also had breast cancer; all on maternal side; would like genetics counseling, fibrocystic breast, would like the 3D mammogram Cervical cancer screening: s/p hyst for endometriosis HIV, hep B, hep C: already addressed STD testing and prevention (chl/gon/syphilis): no need Intimate partner violence: no abuse Contraception: n/a Osteoporosis: due in 2020 Fall prevention/vitamin D: discussed; taking vit D and getting enough calcium Immunizations: shingrix at [employer]; PPSV-23 today; TDaP UTD AAA: no fam hx Diet: calcium; getting fruits and veggies; limiting fatty foods Exercise: fairly active; has MS so energy tanks after exercise Alcohol: no Tobacco use: no Aspirin: did take baby aspirin, but had to back off b/c of aleve, was getting red marks and bruises Lipids:  Lab Results  Component Value Date   CHOL 168 03/21/2017   CHOL 162 06/14/2016   CHOL 137 12/02/2015   Lab Results  Component Value Date   HDL 55 03/21/2017   HDL 52 06/14/2016   HDL 54 12/02/2015   Lab  Results  Component Value Date   LDLCALC 85 03/21/2017   LDLCALC 79 06/14/2016   LDLCALC 53 12/02/2015   Lab Results  Component Value Date   TRIG 139 03/21/2017   TRIG 153 (H) 06/14/2016   TRIG 150 (H) 12/02/2015   Lab Results  Component Value Date   CHOLHDL 3.1 03/21/2017   CHOLHDL 3.1 06/14/2016   CHOLHDL 2.5 12/02/2015   No results found for: LDLDIRECT Glucose:  Glucose, Bld  Date Value Ref Range Status  03/21/2017 96 65 - 99 mg/dL Final  06/14/2016 97 65 - 99 mg/dL Final  12/13/2015 101 (H) 65 - 99 mg/dL Final    Review of Systems  Past Medical History:   Diagnosis Date  . Anxiety   . Chronic constipation   . Depression   . Fibromyalgia   . Full code status 08/02/2017   Patient wishes to have resuscitative efforts made if collapses; but does not want to remain on life support or if condition is terminal -- see copy of living will  . GERD (gastroesophageal reflux disease)   . Hyperlipidemia   . Hypertension   . Hypothyroidism   . IFG (impaired fasting glucose)   . MS (multiple sclerosis) (HCC)    hx of long-term corticosteroid use  . OA (osteoarthritis)   . Osteoporosis 05/2014   managed by Rheumatologist  . TMJ syndrome     Past Surgical History:  Procedure Laterality Date  . ABDOMINAL HYSTERECTOMY     complete  . KNEE ARTHROSCOPY Right    x 2  . lumbarectomy with fusion  [id number]  . ROTATOR CUFF REPAIR    . TOTAL KNEE ARTHROPLASTY Right 09/2007    Family History  Problem Relation Age of Onset  . Cancer Mother        Lymphoma  . Heart disease Mother   . Hypertension Mother   . Stroke Father   . Heart attack Father   . Heart disease Father   . Hypertension Father   . Alcohol abuse Father   . Hypertension Sister   . Cancer Sister 63       colon, stage 3 at age 61  . Hypertension Brother   . Diabetes Brother   . Mental illness Brother   . Depression Brother   . Alcohol abuse Son   . Breast cancer Maternal Aunt 75  . Breast cancer Maternal Aunt 75       HER2  . AAA (abdominal aortic aneurysm) Maternal Grandmother   . Heart disease Maternal Grandmother   . Heart attack Paternal Grandfather     Social History   Tobacco Use  . Smoking status: Never Smoker  . Smokeless tobacco: Never Used  Substance Use Topics  . Alcohol use: No    Alcohol/week: 0.0 oz  . Drug use: No    Outpatient Encounter Medications as of 08/02/2017  Medication Sig Note  . albuterol (PROVENTIL HFA;VENTOLIN HFA) 108 (90 Base) MCG/ACT inhaler Inhale 2 puffs into the lungs every 4 (four) hours as needed for wheezing or shortness of  breath.   . calcium citrate (CALCITRATE - DOSED IN MG ELEMENTAL CALCIUM) 950 MG tablet Take 200 mg of elemental calcium by mouth daily.   . Cholecalciferol (VITAMIN D3) 2000 UNITS TABS Take 2,000 Units by mouth daily.   . cyclobenzaprine (FLEXERIL) 10 MG tablet Take 1 tablet (10 mg total) by mouth 3 (three) times daily as needed for muscle spasms.   Marland Kitchen EPINEPHrine 0.3 mg/0.3 mL IJ SOAJ injection  06/03/2015: Received from: [place]  . ezetimibe-simvastatin (VYTORIN) 10-40 MG tablet 1 Tablet once each day Oral   . lansoprazole (PREVACID) 15 MG capsule Take 15 mg by mouth as needed.   Marland Kitchen levothyroxine (SYNTHROID) 100 MCG tablet Take 1 tablet (100 mcg total) by mouth once daily.   . metoprolol succinate (TOPROL-XL) 25 MG 24 hr tablet Take 1 tablet (25 mg total) by mouth daily.   . naproxen sodium (ALEVE) 220 MG tablet Take 220 mg by mouth 2 (two) times daily as needed.   . potassium chloride SA (K-DUR,KLOR-CON) 20 MEQ tablet Take 1 tablet (20 mEq total) by mouth daily.   . ranitidine (ZANTAC) 300 MG tablet TAKE ONE TABLET AT BED- TIME.   Marland Kitchen sertraline (ZOLOFT) 100 MG tablet Take 1.5 tablets (150 mg total) by mouth daily.   . [DISCONTINUED] sertraline (ZOLOFT) 100 MG tablet Take 1 tablet (100 mg total) by mouth daily.   Marland Kitchen acetaminophen (TYLENOL) 325 MG tablet Take 325 mg by mouth. 07/28/2015: Received from: [place]: Take by mouth.  Marland Kitchen aspirin EC 81 MG tablet Take 81 mg by mouth daily.   Marland Kitchen gabapentin (NEURONTIN) 300 MG capsule Take 300 mg by mouth daily.  07/28/2015: Received from: [place]: Take by mouth.  . naproxen sodium (ANAPROX) 220 MG tablet Take 220 mg by mouth as needed.     No facility-administered encounter medications on file as of 08/02/2017.    Functional Ability / Safety Screening 1.  Was the timed Get Up and Go test less than 12 seconds?  yes 2.  Does the patient need help with the phone,  transportation, shopping,      preparing meals, housework, laundry, medications, or managing money?  no 3.  Does the patient's home have:  loose throw rugs in the hallway?   no      Grab bars in the bathroom? yes      Handrails on the stairs?   yes      Good lighting?   yes 4.  Has the patient noticed any hearing difficulties?   no Back is in pain after being up for a while; doctor told her it was arthritis (this was the surgeon); taking aleve; tried gabapentin  Advanced Directives Does patient have a HCPOA?    yes If yes, name and contact information: Sonia Side [phone] Does patient have a living will or MOST form?  yes No heroic acts to save her life; does not want to be on a ventilator  Fall Risk Assessment See under rooming  Depression Screen See under rooming Depression screen [facility] 2/9 08/02/2017 10/27/2016 07/28/2016 06/14/2016 12/16/2015  Decreased Interest 1 0 0 0 0  Down, Depressed, Hopeless 3 0 0 0 0  PHQ - 2 Score 4 0 0 0 0  Altered sleeping 2 - - - -  Tired, decreased energy 3 - - - -  Change in appetite 2 - - - -  Feeling bad or failure about yourself  1 - - - -  Trouble concentrating 2 - - - -  Moving slowly or fidgety/restless 0 - - - -  Suicidal thoughts 0 - - - -  PHQ-9 Score 14 - - - -  Difficult doing work/chores Somewhat difficult - - - -    Objective:   Vitals: BP (!) 148/80   Pulse 79   Temp 98.7 F (37.1 C) (Oral)   Resp 14   Ht 5'  4" (1.626 m)   Wt 195 lb (88.5 kg)   SpO2 98%   BMI 33.47 kg/m  Body mass index is 33.47 kg/m. No exam data present  Physical Exam  Constitutional: She appears well-developed and well-nourished.  HENT:  Mouth/Throat: Mucous membranes are normal.  Eyes: EOM are normal. No scleral icterus.  Cardiovascular: Normal rate and regular rhythm.  Pulmonary/Chest: Effort normal and breath sounds normal. Right breast exhibits mass. Left breast exhibits mass.    Psychiatric: She has a normal mood and affect. Her behavior is  normal.   Mood/affect:  euthymic Appearance:  Neatly dressed  No flowsheet data found.  Assessment & Plan:     Annual Wellness Visit  Reviewed patient's Family Medical History Reviewed and updated list of patient's medical providers Assessment of cognitive impairment was done Assessed patient's functional ability Established a written schedule for health screening [place] Completed and Reviewed  Exercise Activities and Dietary recommendations Goals    . Weight (lb) < 185 lb (83.9 kg)     Over the next year       Immunization History  Administered Date(s) Administered  . Influenza,inj,Quad PF,6+ Mos 06/03/2015  . Influenza-Unspecified 05/10/2017  . Pneumococcal Polysaccharide-23 08/02/2017  . Td 06/19/1999  . Tdap 03/12/2010  . Zoster 08/08/2011    Health Maintenance  Topic Date Due  . PNA vac Low Risk Adult (2 of 2 - PPSV23) 05/08/2017  . MAMMOGRAM  09/21/2017  . COLONOSCOPY  02/14/2018  . DEXA SCAN  09/21/2018  . TETANUS/TDAP  03/12/2020  . INFLUENZA VACCINE  Completed  . Hepatitis C Screening  Addressed    Discussed health benefits of physical activity, and encouraged her to engage in regular exercise appropriate for her age and condition.   Meds ordered this encounter  Medications  . sertraline (ZOLOFT) 100 MG tablet    Sig: Take 1.5 tablets (150 mg total) by mouth daily.    Dispense:  135 tablet    Refill:  1    We're increasing the dose now    Current Outpatient Medications:  .  albuterol (PROVENTIL HFA;VENTOLIN HFA) 108 (90 Base) MCG/ACT inhaler, Inhale 2 puffs into the lungs every 4 (four) hours as needed for wheezing or shortness of breath., Disp: 1 Inhaler, Rfl: 0 .  calcium citrate (CALCITRATE - DOSED IN MG ELEMENTAL CALCIUM) 950 MG tablet, Take 200 mg of elemental calcium by mouth daily., Disp: , Rfl:  .  Cholecalciferol (VITAMIN D3) 2000 UNITS TABS, Take 2,000 Units by mouth daily., Disp: , Rfl:  .  cyclobenzaprine  (FLEXERIL) 10 MG tablet, Take 1 tablet (10 mg total) by mouth 3 (three) times daily as needed for muscle spasms., Disp: 60 tablet, Rfl: 0 .  EPINEPHrine 0.3 mg/0.3 mL IJ SOAJ injection, , Disp: , Rfl:  .  ezetimibe-simvastatin (VYTORIN) 10-40 MG tablet, 1 Tablet once each day Oral, Disp: 90 tablet, Rfl: 1 .  lansoprazole (PREVACID) 15 MG capsule, Take 15 mg by mouth as needed., Disp: , Rfl:  .  levothyroxine (SYNTHROID) 100 MCG tablet, Take 1 tablet (100 mcg total) by mouth once daily., Disp: 90 tablet, Rfl: 2 .  metoprolol succinate (TOPROL-XL) 25 MG 24 hr tablet, Take 1 tablet (25 mg total) by mouth daily., Disp: 30 tablet, Rfl: 10 .  naproxen sodium (ALEVE) 220 MG tablet, Take 220 mg by mouth 2 (two) times daily as needed., Disp: , Rfl:  .  potassium chloride SA (K-DUR,KLOR-CON) 20 MEQ tablet, Take 1 tablet (20 mEq  total) by mouth daily., Disp: 30 tablet, Rfl: 5 .  ranitidine (ZANTAC) 300 MG tablet, TAKE ONE TABLET AT BED- TIME., Disp: 90 tablet, Rfl: 1 .  sertraline (ZOLOFT) 100 MG tablet, Take 1.5 tablets (150 mg total) by mouth daily., Disp: 135 tablet, Rfl: 1 .  acetaminophen (TYLENOL) 325 MG tablet, Take 325 mg by mouth., Disp: , Rfl:  .  aspirin EC 81 MG tablet, Take 81 mg by mouth daily., Disp: , Rfl:  .  gabapentin (NEURONTIN) 300 MG capsule, Take 300 mg by mouth daily. , Disp: , Rfl:  .  naproxen sodium (ANAPROX) 220 MG tablet, Take 220 mg by mouth as needed. , Disp: , Rfl:  Medications Discontinued During This Encounter  Medication Reason  . sertraline (ZOLOFT) 100 MG tablet Reorder    Next Medicare Wellness Visit in 12+ months  Problem List Items Addressed This Visit      Other   Depression   Relevant Medications   sertraline (ZOLOFT) 100 MG tablet   Anxiety   Relevant Medications   sertraline (ZOLOFT) 100 MG tablet   Preventative health care    USPSTF grade A and B recommendations reviewed with patient; age-appropriate recommendations, preventive care, screening tests,  etc discussed and encouraged; healthy living encouraged; see AVS for patient education given to patient       Need for 23-polyvalent pneumococcal polysaccharide vaccine    Give PPSV-23 today; no more boosters needed for life per current ACIP      Full code status    Patient will bring copy to keep on the chart; consult that for definitive course      Family hx-breast malignancy    Refer to geneticist      Relevant Orders   Ambulatory referral to Genetics    Other Visit Diagnoses    Screening for breast cancer    -  Primary   Relevant Orders   MM Digital Screening

## 2017-08-02 NOTE — Assessment & Plan Note (Signed)
USPSTF grade A and B recommendations reviewed with patient; age-appropriate recommendations, preventive care, screening tests, etc discussed and encouraged; healthy living encouraged; see AVS for patient education given to patient  

## 2017-08-02 NOTE — Patient Instructions (Addendum)
Let's increase the sertraline to 150 mg daily Call me in 3-4 weeks with an update on your mood  Keep an eye on your blood pressure; let me know if consistently over 150; under 140 would be an A for the course  Try to follow the DASH guidelines (DASH stands for Dietary Approaches to Stop Hypertension). Try to limit the sodium in your diet to no more than 1,'500mg'$  of sodium per day. Certainly try to not exceed 2,000 mg per day at the very most. Do not add salt when cooking or at the table.  Check the sodium amount on labels when shopping, and choose items lower in sodium when given a choice. Avoid or limit foods that already contain a lot of sodium. Eat a diet rich in fruits and vegetables and whole grains, and try to lose weight if overweight or obese  Consider getting the Shingrix vaccine at [facility], first injection at least 30 days from now  Health Maintenance  Topic Date Due  . PNA vac Low Risk Adult (2 of 2 - PPSV23) 05/08/2017  . MAMMOGRAM  09/21/2017  . COLONOSCOPY  02/14/2018  . DEXA SCAN  09/21/2018  . TETANUS/TDAP  03/12/2020  . INFLUENZA VACCINE  Completed  . Hepatitis C Screening  Addressed   You have received the Pneumovax vaccine (PPSV-23) and you will not need another booster of this for the rest of your life per current ACIP guidelines  We'll schedule the breast imaging for you   Health Maintenance, Female Adopting a healthy lifestyle and getting preventive care can go a long way to promote health and wellness. Talk with your health care provider about what schedule of regular examinations is right for you. This is a good chance for you to check in with your provider about disease prevention and staying healthy. In between checkups, there are plenty of things you can do on your own. Experts have done a lot of research about which lifestyle changes and preventive measures are most likely to keep you healthy. Ask your health care provider for more information. Weight and  diet Eat a healthy diet  Be sure to include plenty of vegetables, fruits, low-fat dairy products, and lean protein.  Do not eat a lot of foods high in solid fats, added sugars, or salt.  Get regular exercise. This is one of the most important things you can do for your health. ? Most adults should exercise for at least 150 minutes each week. The exercise should increase your heart rate and make you sweat (moderate-intensity exercise). ? Most adults should also do strengthening exercises at least twice a week. This is in addition to the moderate-intensity exercise.  Maintain a healthy weight  Body mass index (BMI) is a measurement that can be used to identify possible weight problems. It estimates body fat based on height and weight. Your health care provider can help determine your BMI and help you achieve or maintain a healthy weight.  For females 37 years of age and older: ? A BMI below 18.5 is considered underweight. ? A BMI of 18.5 to 24.9 is normal. ? A BMI of 25 to 29.9 is considered overweight. ? A BMI of 30 and above is considered obese.  Watch levels of cholesterol and blood lipids  You should start having your blood tested for lipids and cholesterol at 66 years of age, then have this test every 5 years.  You may need to have your cholesterol levels checked more often if: ?  Your lipid or cholesterol levels are high. ? You are older than 66 years of age. ? You are at high risk for heart disease.  Cancer screening Lung Cancer  Lung cancer screening is recommended for adults 45-11 years old who are at high risk for lung cancer because of a history of smoking.  A yearly low-dose CT scan of the lungs is recommended for people who: ? Currently smoke. ? Have quit within the past 15 years. ? Have at least a 30-pack-year history of smoking. A pack year is smoking an average of one pack of cigarettes a day for 1 year.  Yearly screening should continue until it has been 15 years  since you quit.  Yearly screening should stop if you develop a health problem that would prevent you from having lung cancer treatment.  Breast Cancer  Practice breast self-awareness. This means understanding how your breasts normally appear and feel.  It also means doing regular breast self-exams. Let your health care provider know about any changes, no matter how small.  If you are in your 20s or 30s, you should have a clinical breast exam (CBE) by a health care provider every 1-3 years as part of a regular health exam.  If you are 31 or older, have a CBE every year. Also consider having a breast X-ray (mammogram) every year.  If you have a family history of breast cancer, talk to your health care provider about genetic screening.  If you are at high risk for breast cancer, talk to your health care provider about having an MRI and a mammogram every year.  Breast cancer gene (BRCA) assessment is recommended for women who have family members with BRCA-related cancers. BRCA-related cancers include: ? Breast. ? Ovarian. ? Tubal. ? Peritoneal cancers.  Results of the assessment will determine the need for genetic counseling and BRCA1 and BRCA2 testing.  Cervical Cancer Your health care provider may recommend that you be screened regularly for cancer of the pelvic organs (ovaries, uterus, and vagina). This screening involves a pelvic examination, including checking for microscopic changes to the surface of your cervix (Pap test). You may be encouraged to have this screening done every 3 years, beginning at age 10.  For women ages 67-65, health care providers may recommend pelvic exams and Pap testing every 3 years, or they may recommend the Pap and pelvic exam, combined with testing for human papilloma virus (HPV), every 5 years. Some types of HPV increase your risk of cervical cancer. Testing for HPV may also be done on women of any age with unclear Pap test results.  Other health care  providers may not recommend any screening for nonpregnant women who are considered low risk for pelvic cancer and who do not have symptoms. Ask your health care provider if a screening pelvic exam is right for you.  If you have had past treatment for cervical cancer or a condition that could lead to cancer, you need Pap tests and screening for cancer for at least 20 years after your treatment. If Pap tests have been discontinued, your risk factors (such as having a new sexual partner) need to be reassessed to determine if screening should resume. Some women have medical problems that increase the chance of getting cervical cancer. In these cases, your health care provider may recommend more frequent screening and Pap tests.  Colorectal Cancer  This type of cancer can be detected and often prevented.  Routine colorectal cancer screening usually begins at 66 years  of age and continues through 66 years of age.  Your health care provider may recommend screening at an earlier age if you have risk factors for colon cancer.  Your health care provider may also recommend using home test kits to check for hidden blood in the stool.  A small camera at the end of a tube can be used to examine your colon directly (sigmoidoscopy or colonoscopy). This is done to check for the earliest forms of colorectal cancer.  Routine screening usually begins at age 76.  Direct examination of the colon should be repeated every 5-10 years through 66 years of age. However, you may need to be screened more often if early forms of precancerous polyps or small growths are found.  Skin Cancer  Check your skin from head to toe regularly.  Tell your health care provider about any new moles or changes in moles, especially if there is a change in a mole's shape or color.  Also tell your health care provider if you have a mole that is larger than the size of a pencil eraser.  Always use sunscreen. Apply sunscreen liberally and  repeatedly throughout the day.  Protect yourself by wearing long sleeves, pants, a wide-brimmed hat, and sunglasses whenever you are outside.  Heart disease, diabetes, and high blood pressure  High blood pressure causes heart disease and increases the risk of stroke. High blood pressure is more likely to develop in: ? People who have blood pressure in the high end of the normal range (130-139/85-89 mm Hg). ? People who are overweight or obese. ? People who are African American.  If you are 64-67 years of age, have your blood pressure checked every 3-5 years. If you are 93 years of age or older, have your blood pressure checked every year. You should have your blood pressure measured twice-once when you are at a hospital or clinic, and once when you are not at a hospital or clinic. Record the average of the two measurements. To check your blood pressure when you are not at a hospital or clinic, you can use: ? An automated blood pressure machine at a pharmacy. ? A home blood pressure monitor.  If you are between 48 years and 37 years old, ask your health care provider if you should take aspirin to prevent strokes.  Have regular diabetes screenings. This involves taking a blood sample to check your fasting blood sugar level. ? If you are at a normal weight and have a low risk for diabetes, have this test once every three years after 66 years of age. ? If you are overweight and have a high risk for diabetes, consider being tested at a younger age or more often. Preventing infection Hepatitis B  If you have a higher risk for hepatitis B, you should be screened for this virus. You are considered at high risk for hepatitis B if: ? You were born in a country where hepatitis B is common. Ask your health care provider which countries are considered high risk. ? Your parents were born in a high-risk country, and you have not been immunized against hepatitis B (hepatitis B vaccine). ? You have HIV or  AIDS. ? You use needles to inject street drugs. ? You live with someone who has hepatitis B. ? You have had sex with someone who has hepatitis B. ? You get hemodialysis treatment. ? You take certain medicines for conditions, including cancer, organ transplantation, and autoimmune conditions.  Hepatitis C  Blood  testing is recommended for: ? Everyone born from [birth year] through 1965. ? Anyone with known risk factors for hepatitis C.  Sexually transmitted infections (STIs)  You should be screened for sexually transmitted infections (STIs) including gonorrhea and chlamydia if: ? You are sexually active and are younger than 66 years of age. ? You are older than 66 years of age and your health care provider tells you that you are at risk for this type of infection. ? Your sexual activity has changed since you were last screened and you are at an increased risk for chlamydia or gonorrhea. Ask your health care provider if you are at risk.  If you do not have HIV, but are at risk, it may be recommended that you take a prescription medicine daily to prevent HIV infection. This is called pre-exposure prophylaxis (PrEP). You are considered at risk if: ? You are sexually active and do not regularly use condoms or know the HIV status of your partner(s). ? You take drugs by injection. ? You are sexually active with a partner who has HIV.  Talk with your health care provider about whether you are at high risk of being infected with HIV. If you choose to begin PrEP, you should first be tested for HIV. You should then be tested every 3 months for as long as you are taking PrEP. Pregnancy  If you are premenopausal and you may become pregnant, ask your health care provider about preconception counseling.  If you may become pregnant, take 400 to 800 micrograms (mcg) of folic acid every day.  If you want to prevent pregnancy, talk to your health care provider about birth control (contraception). Osteoporosis  and menopause  Osteoporosis is a disease in which the bones lose minerals and strength with aging. This can result in serious bone fractures. Your risk for osteoporosis can be identified using a bone density scan.  If you are 50 years of age or older, or if you are at risk for osteoporosis and fractures, ask your health care provider if you should be screened.  Ask your health care provider whether you should take a calcium or vitamin D supplement to lower your risk for osteoporosis.  Menopause may have certain physical symptoms and risks.  Hormone replacement therapy may reduce some of these symptoms and risks. Talk to your health care provider about whether hormone replacement therapy is right for you. Follow these instructions at home:  Schedule regular health, dental, and eye exams.  Stay current with your immunizations.  Do not use any tobacco products including cigarettes, chewing tobacco, or electronic cigarettes.  If you are pregnant, do not drink alcohol.  If you are breastfeeding, limit how much and how often you drink alcohol.  Limit alcohol intake to no more than 1 drink per day for nonpregnant women. One drink equals 12 ounces of beer, 5 ounces of wine, or 1 ounces of hard liquor.  Do not use street drugs.  Do not share needles.  Ask your health care provider for help if you need support or information about quitting drugs.  Tell your health care provider if you often feel depressed.  Tell your health care provider if you have ever been abused or do not feel safe at home. This information is not intended to replace advice given to you by your health care provider. Make sure you discuss any questions you have with your health care provider. Document Released: 02/06/2011 Document Revised: 12/30/2015 Document Reviewed: 04/27/2015 Elsevier Interactive Patient Education  2018 [place].

## 2017-08-02 NOTE — Assessment & Plan Note (Signed)
Patient will bring copy to keep on the chart; consult that for definitive course

## 2017-08-02 NOTE — Assessment & Plan Note (Signed)
Refer to geneticist

## 2017-08-02 NOTE — Assessment & Plan Note (Signed)
Give PPSV-23 today; no more boosters needed for life per current ACIP

## 2017-08-03 ENCOUNTER — Telehealth: Payer: Self-pay

## 2017-08-03 NOTE — Telephone Encounter (Signed)
Copied from CRM [id number]. Topic: Referral - Request >> Aug 03, 2017  2:03 PM Waymon AmatoBurton, Donna F wrote: Reason for CRM: pt states that she wanted to let the referral department know that she will be out of town from 08/13/17/08/17/17   Best number [phone]  This information has been documented in the referral note.

## 2017-08-13 ENCOUNTER — Telehealth: Payer: Self-pay | Admitting: Genetic Counselor

## 2017-08-13 NOTE — Telephone Encounter (Addendum)
  Ms. Joens called back today, 08/21/17, and opted to schedule a genetic counseling appt on 08/28/2017 at 11am.  ------------------------------------ Another message was left today, 08/20/17, for Ms. Giovannini to call and schedule her appointment. Closing referral for now, but she is welcome to contact me if she wishes in the future.  Elmer Picker, MS, CGC  ------------------------------------- Ms. Baldonado was referred for genetic counseling by Dr. Baruch Gouty due to a family history of breast cancer. I left her messages on 08/10/17 and 08/13/17 to call and schedule this telegenetics visit to be done by phone at her convenience.   Elmer Picker, MS, CGC Genetic Counselor Phone: [phone]

## 2017-08-20 ENCOUNTER — Ambulatory Visit
Admission: RE | Admit: 2017-08-20 | Discharge: 2017-08-20 | Disposition: A | Discharge status: Home / Self Care | Payer: Medicare Other | Source: Ambulatory Visit | Attending: Family Medicine | Admitting: Family Medicine

## 2017-08-20 DIAGNOSIS — N63 Unspecified lump in unspecified breast: Secondary | ICD-10-CM

## 2017-08-20 DIAGNOSIS — Z803 Family history of malignant neoplasm of breast: Secondary | ICD-10-CM | POA: Diagnosis not present

## 2017-08-20 DIAGNOSIS — R928 Other abnormal and inconclusive findings on diagnostic imaging of breast: Secondary | ICD-10-CM | POA: Diagnosis present

## 2017-08-28 ENCOUNTER — Telehealth: Payer: Self-pay | Admitting: Genetic Counselor

## 2017-08-28 ENCOUNTER — Encounter: Payer: Self-pay | Admitting: Genetic Counselor

## 2017-08-28 DIAGNOSIS — Z1379 Encounter for other screening for genetic and chromosomal anomalies: Secondary | ICD-10-CM

## 2017-08-28 HISTORY — DX: Encounter for other screening for genetic and chromosomal anomalies: Z13.79

## 2017-08-28 NOTE — Telephone Encounter (Signed)
Cancer Genetics            Telegenetics Initial Visit    Patient Name: Brittany Hill Patient DOB: [birth year] Patient Age: 67 y.o. Phone Call Date: 08/28/2017  Referring Provider: Enid Derry, Hill  Reason for Visit: Evaluate for hereditary susceptibility to cancer    Assessment and Plan:  . Brittany Hill's history is not suggestive of a hereditary predisposition to cancer.   . Genetic testing is is not clinically indicated given her risk level. It is available, however, to determine whether she has a pathogenic mutation that will impact her screening and risk-reduction for cancer. A negative result will be reassuring.  . Brittany Hill wished to pursue genetic testing and will be contacted to schedules a lab visit for a blood sample. Analysis will include the 83 genes on [facility]'s Multi-Cancer panel (ALK, APC, ATM, AXIN2, BAP1, BARD1, BLM, BMPR1A, BRCA1, BRCA2, BRIP1, CASR, CDC73, CDH1, CDK4, CDKN1B, CDKN1C, CDKN2A, CEBPA, CHEK2, CTNNA1, DICER1, DIS3L2, EGFR, EPCAM, FH, FLCN, GATA2, GPC3, GREM1, HOXB13, HRAS, KIT, MAX, MEN1, MET, MITF, MLH1, MSH2, MSH3, MSH6, MUTYH, NBN, NF1, NF2, NTHL1, PALB2, PDGFRA, PHOX2B, PMS2, POLD1, POLE, POT1, PRKAR1A, PTCH1, PTEN, RAD50, RAD51C, RAD51D, RB1, RECQL4, RET, RUNX1, SDHA, SDHAF2, SDHB, SDHC, SDHD, SMAD4, SMARCA4, SMARCB1, SMARCE1, STK11, SUFU, TERC, TERT, TMEM127, TP53, TSC1, TSC2, VHL, WRN, WT1).   . Results should be available in approximately 2-3 weeks, at which point we will contact her and address implications for her as well as address genetic testing for at-risk family members, if needed.     Dr. Grayland Hill was available for questions concerning this case. Total time spent by counseling by phone was approximately 25 minutes.   _____________________________________________________________________   History of Present Illness: Brittany Hill, a 67 y.o. female, was referred for genetic counseling to discuss the  possibility of a hereditary predisposition to cancer and discuss whether genetic testing is warranted. This was a telegenetics visit via phone.  Brittany Hill has no personal history of cancer.  Past Medical History:  Diagnosis Date  . Anxiety   . Chronic constipation   . Depression   . Fibromyalgia   . Full code status 08/02/2017   Patient wishes to have resuscitative efforts made if collapses; but does not want to remain on life support or if condition is terminal -- see copy of living will  . GERD (gastroesophageal reflux disease)   . Hyperlipidemia   . Hypertension   . Hypothyroidism   . IFG (impaired fasting glucose)   . MS (multiple sclerosis) (HCC)    hx of long-term corticosteroid use  . OA (osteoarthritis)   . Osteoporosis 05/2014   managed by Rheumatologist  . TMJ syndrome     Past Surgical History:  Procedure Laterality Date  . ABDOMINAL HYSTERECTOMY     complete at age 51; HRT for ~25y  . KNEE ARTHROSCOPY Right    x 2  . lumbarectomy with fusion  [id number]  . ROTATOR CUFF REPAIR    . TOTAL KNEE ARTHROPLASTY Right 09/2007    Family History: Significant diagnoses include the following:  Family History  Problem Relation Age of Onset  . Heart disease Mother   . Hypertension Mother   . Lymphoma Mother 76       deceased 6  . Stroke Father   . Heart attack Father   . Heart disease Father   . Hypertension Father   . Alcohol abuse Father   .  Hypertension Sister   . Colon cancer Sister 9  . Hypertension Brother   . Diabetes Brother   . Mental illness Brother   . Depression Brother   . Lung cancer Brother 56       smoker; currently 19  . Alcohol abuse Son   . Breast cancer Maternal Aunt 75       currently 73  . Breast cancer Maternal Aunt        dx 61s; mets in 71s  . AAA (abdominal aortic aneurysm) Maternal Grandmother   . Heart disease Maternal Grandmother   . Heart attack Paternal Grandfather   . Throat cancer Paternal Uncle        smoker     Additionally, Brittany Hill has a son (age 16) and a daughter (age 59). She has a brother and sister (both noted above). Her mother had 3 full sisters and a full brother. She also had half-siblings. Her father died young at 42 (see above). He had a brother and sister. There are no cancers reported in any grandparents.  Brittany Hill ancestry is Caucasian - NOS. There is no known Jewish ancestry and no consanguinity.  Discussion: We reviewed the characteristics, features and inheritance patterns of hereditary cancer syndromes. We discussed her risk of harboring a mutation in the context of her personal and family history. We discussed the process of genetic testing, insurance coverage and implications of results: positive, negative and variant of unknown significance (VUS).   Brittany Hill questions were answered to her satisfaction today and she is welcome to call with any additional questions or concerns. Thank you for the referral and allowing [place] to share in the care of your patient.    Brittany Berg, MS, [place] Certified Genetic Counselor phone: [phone]

## 2017-09-04 ENCOUNTER — Other Ambulatory Visit: Payer: Self-pay

## 2017-09-04 MED ORDER — CYCLOBENZAPRINE HCL 10 MG PO TABS: 10.0000 mg | ORAL_TABLET | Freq: Three times a day (TID) | ORAL | 0 refills | Status: DC | PRN

## 2017-09-04 MED ORDER — METOPROLOL SUCCINATE ER 25 MG PO TB24: ORAL_TABLET | ORAL | 11 refills | Status: DC

## 2017-09-05 ENCOUNTER — Inpatient Hospital Stay: Payer: Medicare Other | Attending: Oncology

## 2017-09-11 ENCOUNTER — Encounter: Payer: Self-pay | Admitting: Genetic Counselor

## 2017-09-11 ENCOUNTER — Telehealth: Payer: Self-pay | Admitting: Genetic Counselor

## 2017-09-11 NOTE — Telephone Encounter (Signed)
Cancer Genetics             Telegenetics Results Disclosure   Patient Name: Brittany Hill Patient DOB: 1950-11-25 Patient Age: 66 y.o. Phone Call Date: 09/11/2017  Referring Provider: Enid Derry, MD    Brittany Hill was called today to discuss genetic test results. Please see the Genetics telephone note from 08/28/2017 for a detailed discussion of her personal and family histories and the recommendations provided.  Genetic Testing: At the time of Brittany Hill's telegenetics visit, she decided to pursue genetic testing of multiple genes associated with hereditary susceptibility to cancer. Testing included sequencing and deletion/duplication analysis. Testing did not reveal any pathogenic mutation in any of these genes.  A copy of the genetic test report will be scanned into Epic under the Media tab.  The genes analyzed were the 83 genes on [facility]'s Multi-Cancer panel (ALK, APC, ATM, AXIN2, BAP1, BARD1, BLM, BMPR1A, BRCA1, BRCA2, BRIP1, CASR, CDC73, CDH1, CDK4, CDKN1B, CDKN1C, CDKN2A, CEBPA, CHEK2, CTNNA1, DICER1, DIS3L2, EGFR, EPCAM, FH, FLCN, GATA2, GPC3, GREM1, HOXB13, HRAS, KIT, MAX, MEN1, MET, MITF, MLH1, MSH2, MSH3, MSH6, MUTYH, NBN, NF1, NF2, NTHL1, PALB2, PDGFRA, PHOX2B, PMS2, POLD1, POLE, POT1, PRKAR1A, PTCH1, PTEN, RAD50, RAD51C, RAD51D, RB1, RECQL4, RET, RUNX1, SDHA, SDHAF2, SDHB, SDHC, SDHD, SMAD4, SMARCA4, SMARCB1, SMARCE1, STK11, SUFU, TERC, TERT, TMEM127, TP53, TSC1, TSC2, VHL, WRN, WT1).  Since the current test is not perfect, it is possible that there may be a gene mutation that current testing cannot detect, but that chance is small. It is possible that a different genetic factor, which has not yet been discovered or is not on this panel, is responsible for the cancer diagnoses in the family. Again, the likelihood of this is low. No additional testing is recommended at this time for Brittany Hill.  Cancer Screening: These results are reassuring  and indicate that Brittany Hill does not likely have an increased risk of cancer due to a mutation in one of these genes. We discussed undergoing cancer screenings for individuals in the general population. The [employer]  recommends that women follow the breast screening recommendations below, but that these may need to be modified based on other risk factors such as dense breasts, biopsy history or family history.  Breast awareness - Women should be familiar with their breasts and promptly report changes to their healthcare provider.   Starting at age 56: Breast exam, risk assessment, and risk reduction counseling by the provider and mammogram every year. The provider may discuss screening with tomosynthesis.  Brittany Hill is also recommended to undergo a yearly gynecologic exam and speak with her GI doctor as to when to have her next colonoscopy.  Family Members: Family members may also be at some increased risk of developing cancer, over the general population risk, simply due to the family history. Women are recommended to have a yearly mammogram beginning at age 20, a yearly clinical breast exam, a yearly gynecologic exam and perform monthly breast self-exams. Colon cancer screening is recommended to begin by age 25 in both men and women, unless there is a family history of colon cancer or colon polyps or an individual has a personal history to warrant initiating screening at a younger age.  Any relative who had cancer at a young age or had a particularly rare cancer may also wish to pursue genetic testing. Genetic counselors can be  located in other cities, by visiting the website of the [employer] ([employer]) and Field seismologist for a Dietitian by zip code.    Lastly, cancer genetics is a rapidly advancing field and it is possible that new genetic tests will be appropriate for Brittany Hill in the future. We encourage Brittany Hill to remain  in contact with Genetics on an annual basis so her personal and family histories can be updated.    Steele Berg, MS, [place] Certified Genetic Counselor phone: [phone]

## 2017-09-18 NOTE — Telephone Encounter (Signed)
Visit complete.

## 2017-09-24 ENCOUNTER — Encounter: Payer: Self-pay | Admitting: *Deleted

## 2017-09-27 ENCOUNTER — Ambulatory Visit
Admission: RE | Admit: 2017-09-27 | Discharge: 2017-09-27 | Disposition: A | Discharge status: Home / Self Care | Payer: Medicare Other | Source: Ambulatory Visit | Attending: Ophthalmology | Admitting: Ophthalmology

## 2017-09-27 ENCOUNTER — Other Ambulatory Visit: Payer: Self-pay

## 2017-09-27 ENCOUNTER — Ambulatory Visit: Payer: Medicare Other | Admitting: Anesthesiology

## 2017-09-27 ENCOUNTER — Encounter
Admission: RE | Disposition: A | Discharge status: Home / Self Care | Payer: Self-pay | Source: Ambulatory Visit | Attending: Ophthalmology

## 2017-09-27 ENCOUNTER — Encounter: Payer: Self-pay | Admitting: *Deleted

## 2017-09-27 DIAGNOSIS — Z882 Allergy status to sulfonamides status: Secondary | ICD-10-CM | POA: Diagnosis not present

## 2017-09-27 DIAGNOSIS — Z91013 Allergy to seafood: Secondary | ICD-10-CM | POA: Diagnosis not present

## 2017-09-27 DIAGNOSIS — R011 Cardiac murmur, unspecified: Secondary | ICD-10-CM | POA: Insufficient documentation

## 2017-09-27 DIAGNOSIS — Z96653 Presence of artificial knee joint, bilateral: Secondary | ICD-10-CM | POA: Insufficient documentation

## 2017-09-27 DIAGNOSIS — M810 Age-related osteoporosis without current pathological fracture: Secondary | ICD-10-CM | POA: Insufficient documentation

## 2017-09-27 DIAGNOSIS — Z90710 Acquired absence of both cervix and uterus: Secondary | ICD-10-CM | POA: Insufficient documentation

## 2017-09-27 DIAGNOSIS — E7800 Pure hypercholesterolemia, unspecified: Secondary | ICD-10-CM | POA: Insufficient documentation

## 2017-09-27 DIAGNOSIS — I10 Essential (primary) hypertension: Secondary | ICD-10-CM | POA: Insufficient documentation

## 2017-09-27 DIAGNOSIS — H2512 Age-related nuclear cataract, left eye: Secondary | ICD-10-CM | POA: Insufficient documentation

## 2017-09-27 DIAGNOSIS — J45909 Unspecified asthma, uncomplicated: Secondary | ICD-10-CM | POA: Insufficient documentation

## 2017-09-27 DIAGNOSIS — F418 Other specified anxiety disorders: Secondary | ICD-10-CM | POA: Diagnosis not present

## 2017-09-27 DIAGNOSIS — I499 Cardiac arrhythmia, unspecified: Secondary | ICD-10-CM | POA: Diagnosis not present

## 2017-09-27 DIAGNOSIS — G510 Bell's palsy: Secondary | ICD-10-CM | POA: Insufficient documentation

## 2017-09-27 DIAGNOSIS — G35 Multiple sclerosis: Secondary | ICD-10-CM | POA: Diagnosis not present

## 2017-09-27 DIAGNOSIS — M1990 Unspecified osteoarthritis, unspecified site: Secondary | ICD-10-CM | POA: Diagnosis not present

## 2017-09-27 DIAGNOSIS — M797 Fibromyalgia: Secondary | ICD-10-CM | POA: Insufficient documentation

## 2017-09-27 DIAGNOSIS — Z885 Allergy status to narcotic agent status: Secondary | ICD-10-CM | POA: Insufficient documentation

## 2017-09-27 DIAGNOSIS — K219 Gastro-esophageal reflux disease without esophagitis: Secondary | ICD-10-CM | POA: Diagnosis not present

## 2017-09-27 DIAGNOSIS — E039 Hypothyroidism, unspecified: Secondary | ICD-10-CM | POA: Insufficient documentation

## 2017-09-27 HISTORY — DX: Pneumonia, unspecified organism: J18.9

## 2017-09-27 HISTORY — DX: Cardiac murmur, unspecified: R01.1

## 2017-09-27 HISTORY — DX: Cardiac arrhythmia, unspecified: I49.9

## 2017-09-27 HISTORY — DX: Unspecified asthma, uncomplicated: J45.909

## 2017-09-27 HISTORY — PX: CATARACT EXTRACTION W/PHACO: SHX586

## 2017-09-27 SURGERY — PHACOEMULSIFICATION, CATARACT, WITH IOL INSERTION
Anesthesia: Monitor Anesthesia Care | Site: Eye | Laterality: Left | Wound class: Clean

## 2017-09-27 MED ORDER — MOXIFLOXACIN HCL 0.5 % OP SOLN: 1.0000 [drp] | Freq: Once | OPHTHALMIC | Status: DC

## 2017-09-27 MED ORDER — FENTANYL CITRATE (PF) 100 MCG/2ML IJ SOLN
INTRAMUSCULAR | Status: DC | PRN
  Administered 2017-09-27: 50 ug via INTRAVENOUS

## 2017-09-27 MED ORDER — SODIUM HYALURONATE 23 MG/ML IO SOLN
INTRAOCULAR | Status: DC | PRN
  Administered 2017-09-27: 0.6 mL via INTRAOCULAR

## 2017-09-27 MED ORDER — SODIUM HYALURONATE 10 MG/ML IO SOLN
INTRAOCULAR | Status: DC | PRN
  Administered 2017-09-27: 0.55 mL via INTRAOCULAR

## 2017-09-27 MED ORDER — ARMC OPHTHALMIC DILATING DROPS
1.0000 "application " | OPHTHALMIC | Status: AC
  Administered 2017-09-27 (×3): 1 via OPHTHALMIC

## 2017-09-27 MED ORDER — SODIUM CHLORIDE 0.9 % IV SOLN
INTRAVENOUS | Status: DC
  Administered 2017-09-27 (×2): via INTRAVENOUS

## 2017-09-27 MED ORDER — MIDAZOLAM HCL 2 MG/2ML IJ SOLN
INTRAMUSCULAR | Status: AC
  Filled 2017-09-27: qty 2

## 2017-09-27 MED ORDER — MIDAZOLAM HCL 2 MG/2ML IJ SOLN
INTRAMUSCULAR | Status: DC | PRN
  Administered 2017-09-27: 1 mg via INTRAVENOUS

## 2017-09-27 MED ORDER — POVIDONE-IODINE 5 % OP SOLN
OPHTHALMIC | Status: AC
  Filled 2017-09-27: qty 30

## 2017-09-27 MED ORDER — LIDOCAINE HCL (PF) 4 % IJ SOLN
INTRAOCULAR | Status: DC | PRN
  Administered 2017-09-27: 4 mL via OPHTHALMIC

## 2017-09-27 MED ORDER — EPINEPHRINE PF 1 MG/ML IJ SOLN
INTRAMUSCULAR | Status: AC
  Filled 2017-09-27: qty 1

## 2017-09-27 MED ORDER — MOXIFLOXACIN HCL 0.5 % OP SOLN
OPHTHALMIC | Status: DC | PRN
  Administered 2017-09-27: 0.2 mL via OPHTHALMIC

## 2017-09-27 MED ORDER — MOXIFLOXACIN HCL 0.5 % OP SOLN
OPHTHALMIC | Status: AC
  Filled 2017-09-27: qty 3

## 2017-09-27 MED ORDER — POVIDONE-IODINE 5 % OP SOLN
OPHTHALMIC | Status: DC | PRN
  Administered 2017-09-27: 1 via OPHTHALMIC

## 2017-09-27 MED ORDER — LIDOCAINE HCL (PF) 4 % IJ SOLN
INTRAMUSCULAR | Status: AC
  Filled 2017-09-27: qty 5

## 2017-09-27 MED ORDER — ARMC OPHTHALMIC DILATING DROPS
OPHTHALMIC | Status: AC
  Administered 2017-09-27: 1 via OPHTHALMIC
  Filled 2017-09-27: qty 0.4

## 2017-09-27 MED ORDER — FENTANYL CITRATE (PF) 100 MCG/2ML IJ SOLN
INTRAMUSCULAR | Status: AC
  Filled 2017-09-27: qty 2

## 2017-09-27 MED ORDER — BSS IO SOLN
INTRAOCULAR | Status: DC | PRN
  Administered 2017-09-27: 200 mL via INTRAOCULAR

## 2017-09-27 MED ORDER — SODIUM HYALURONATE 23 MG/ML IO SOLN
INTRAOCULAR | Status: AC
  Filled 2017-09-27: qty 0.6

## 2017-09-27 SURGICAL SUPPLY — 17 items
CARTRIDGE ABBOTT (MISCELLANEOUS) ×3 IMPLANT
DISSECTOR HYDRO NUCLEUS 50X22 (MISCELLANEOUS) ×3 IMPLANT
GLOVE BIO SURGEON STRL SZ8 (GLOVE) ×3 IMPLANT
GLOVE BIOGEL M 6.5 STRL (GLOVE) ×3 IMPLANT
GLOVE SURG LX 7.5 STRW (GLOVE) ×2
GLOVE SURG LX STRL 7.5 STRW (GLOVE) ×1 IMPLANT
GOWN STRL REUS W/ TWL LRG LVL3 (GOWN DISPOSABLE) ×2 IMPLANT
GOWN STRL REUS W/TWL LRG LVL3 (GOWN DISPOSABLE) ×4
LABEL CATARACT MEDS ST (LABEL) ×3 IMPLANT
LENS IOL TECNIS SYMFONY 22.0 ×3 IMPLANT
PACK CATARACT (MISCELLANEOUS) ×3 IMPLANT
PACK CATARACT KING (MISCELLANEOUS) ×3 IMPLANT
PACK EYE AFTER SURG (MISCELLANEOUS) ×3 IMPLANT
SOL BSS BAG (MISCELLANEOUS) ×3
SOLUTION BSS BAG (MISCELLANEOUS) ×1 IMPLANT
WATER STERILE IRR 250ML POUR (IV SOLUTION) ×3 IMPLANT
WIPE NON LINTING 3.25X3.25 (MISCELLANEOUS) ×3 IMPLANT

## 2017-09-27 NOTE — OR Nursing (Signed)
Discharge instructions discussed with pt and family. Both voice understanding. 

## 2017-09-27 NOTE — Anesthesia Post-op Follow-up Note (Signed)
Anesthesia QCDR form completed.        

## 2017-09-27 NOTE — H&P (Signed)
The History and Physical notes are on paper, have been signed, and are to be scanned.   I have examined the patient and there are no changes to the H&P.   Willey Blade 09/27/2017 8:02 AM

## 2017-09-27 NOTE — Anesthesia Preprocedure Evaluation (Signed)
Anesthesia Evaluation  Patient identified by MRN, date of birth, ID band Patient awake    Reviewed: Allergy & Precautions, NPO status , Patient's Chart, lab work & pertinent test results, reviewed documented beta blocker date and time   Airway Mallampati: III  TM Distance: >3 FB     Dental  (+) Chipped   Pulmonary asthma , pneumonia, resolved,           Cardiovascular hypertension, Pt. on medications and Pt. on home beta blockers + dysrhythmias + Valvular Problems/Murmurs      Neuro/Psych PSYCHIATRIC DISORDERS Anxiety Depression  Neuromuscular disease    GI/Hepatic GERD  Controlled,  Endo/Other  Hypothyroidism   Renal/GU      Musculoskeletal  (+) Arthritis , Fibromyalgia -  Abdominal   Peds  Hematology   Anesthesia Other Findings   Reproductive/Obstetrics                             Anesthesia Physical Anesthesia Plan  ASA: III  Anesthesia Plan: MAC   Post-op Pain Management:    Induction:   PONV Risk Score and Plan:   Airway Management Planned:   Additional Equipment:   Intra-op Plan:   Post-operative Plan:   Informed Consent: I have reviewed the patients History and Physical, chart, labs and discussed the procedure including the risks, benefits and alternatives for the proposed anesthesia with the patient or authorized representative who has indicated his/her understanding and acceptance.     Plan Discussed with: CRNA  Anesthesia Plan Comments:         Anesthesia Quick Evaluation

## 2017-09-27 NOTE — Op Note (Signed)
OPERATIVE NOTE  Brittany Hill [id number] 09/27/2017   PREOPERATIVE DIAGNOSIS:  Nuclear sclerotic cataract left eye.  H25.12   POSTOPERATIVE DIAGNOSIS:    Nuclear sclerotic cataract left eye.     PROCEDURE:  Phacoemusification with posterior chamber intraocular lens placement of the left eye   LENS:   Implant Name Type Inv. Item Serial No. Manufacturer Lot No. LRB No. Used  LENS IOL TORIC 22.0 - [id number]  LENS IOL TORIC 22.0 [id number] AMO  Left 1       [id number] +22.0 Symfony enhanced depth of focus IOL   ULTRASOUND TIME: 0 minutes 27 seconds.  CDE 3.75   SURGEON:  Willey Blade, MD, MPH   ANESTHESIA:  Topical with tetracaine drops augmented with 1% preservative-free intracameral lidocaine.  ESTIMATED BLOOD LOSS: <1 mL   COMPLICATIONS:  None.   DESCRIPTION OF PROCEDURE:  The patient was identified in the holding room and transported to the operating room and placed in the supine position under the operating microscope.  The left eye was identified as the operative eye and it was prepped and draped in the usual sterile ophthalmic fashion.   A 1.0 millimeter clear-corneal paracentesis was made at the 5:00 position. 0.5 ml of preservative-free 1% lidocaine with epinephrine was injected into the anterior chamber.  The anterior chamber was filled with Healon 5 viscoelastic.  A 2.4 millimeter keratome was used to make a near-clear corneal incision at the 2:00 position.  A curvilinear capsulorrhexis was made with a cystotome and capsulorrhexis forceps.  Balanced salt solution was used to hydrodissect and hydrodelineate the nucleus.   Phacoemulsification was then used in stop and chop fashion to remove the lens nucleus and epinucleus.  The remaining cortex was then removed using the irrigation and aspiration handpiece. Healon was then placed into the capsular bag to distend it for lens placement.  A lens was then injected into the capsular bag.  The remaining viscoelastic was  aspirated.   Wounds were hydrated with balanced salt solution.  The anterior chamber was inflated to a physiologic pressure with balanced salt solution.  Intracameral vigamox 0.1 mL undiltued was injected into the eye and a drop placed onto the ocular surface.  No wound leaks were noted. The lens was well centered. The patient was taken to the recovery room in stable condition without complications of anesthesia or surgery  Willey Blade 09/27/2017, 8:37 AM

## 2017-09-27 NOTE — Discharge Instructions (Signed)
FOLLOW DR. Elmer Bales POSTOP EYE DROP SHEET AS REVIEWED.  Eye Surgery Discharge Instructions  Expect mild scratchy sensation or mild soreness. DO NOT RUB YOUR EYE!  The day of surgery:  Minimal physical activity, but bed rest is not required  No reading, computer work, or close hand work  No bending, lifting, or straining.  May watch TV  For 24 hours:  No driving, legal decisions, or alcoholic beverages  Safety precautions  Eat anything you prefer: It is better to start with liquids, then soup then solid foods.  _____ Eye patch should be worn until postoperative exam tomorrow.  ____ Solar shield eyeglasses should be worn for comfort in the sunlight/patch while sleeping  Resume all regular medications including aspirin or Coumadin if these were discontinued prior to surgery. You may shower, bathe, shave, or wash your hair. Tylenol may be taken for mild discomfort.  Call your doctor if you experience significant pain, nausea, or vomiting, fever > 101 or other signs of infection. [phone] or [phone] Specific instructions:  Follow-up Information    [place] Crane, MD Follow up.   Specialty:  Ophthalmology Why:  09/28/17 @ 9:15 am Contact information: [address] [place] [address] [phone]

## 2017-09-27 NOTE — Anesthesia Postprocedure Evaluation (Signed)
Anesthesia Post Note  Patient: Brittany Hill  Procedure(s) Performed: CATARACT EXTRACTION PHACO AND INTRAOCULAR LENS PLACEMENT ([place]) (Left Eye)  Patient location during evaluation: Short Stay Anesthesia Type: MAC Level of consciousness: awake and awake and alert Pain management: pain level controlled Vital Signs Assessment: post-procedure vital signs reviewed and stable Respiratory status: spontaneous breathing and nonlabored ventilation Cardiovascular status: blood pressure returned to baseline and stable Postop Assessment: no headache and no backache Anesthetic complications: no     Last Vitals:  Vitals:   09/27/17 0647  BP: 130/65  Pulse: 79  Resp: 18  Temp: (!) 35.7 C  SpO2: 96%    Last Pain:  Vitals:   09/27/17 0647  TempSrc: Tympanic                 Buckner [name]

## 2017-09-27 NOTE — Transfer of Care (Signed)
Immediate Anesthesia Transfer of Care Note  Patient: Brittany Hill  Procedure(s) Performed: CATARACT EXTRACTION PHACO AND INTRAOCULAR LENS PLACEMENT ([facility]) (Left Eye)  Patient Location: PACU  Anesthesia Type:MAC  Level of Consciousness: awake  Airway & Oxygen Therapy: Patient Spontanous Breathing and Patient connected to nasal cannula oxygen  Post-op Assessment: Report given to RN and Post -op Vital signs reviewed and stable  Post vital signs: Reviewed and stable  Last Vitals:  Vitals:   09/27/17 0647  BP: 130/65  Pulse: 79  Resp: 18  Temp: (!) 35.7 C  SpO2: 96%    Last Pain:  Vitals:   09/27/17 0647  TempSrc: Tympanic         Complications: No apparent anesthesia complications

## 2017-10-05 ENCOUNTER — Encounter: Payer: Self-pay | Admitting: Family Medicine

## 2017-10-05 ENCOUNTER — Ambulatory Visit: Payer: Medicare Other | Admitting: Family Medicine

## 2017-10-05 VITALS — BP 138/70 | HR 105 | Temp 98.3°F | Wt 197.1 lb

## 2017-10-05 DIAGNOSIS — Z9889 Other specified postprocedural states: Secondary | ICD-10-CM

## 2017-10-05 DIAGNOSIS — E119 Type 2 diabetes mellitus without complications: Secondary | ICD-10-CM | POA: Insufficient documentation

## 2017-10-05 DIAGNOSIS — J45909 Unspecified asthma, uncomplicated: Secondary | ICD-10-CM | POA: Insufficient documentation

## 2017-10-05 DIAGNOSIS — R6889 Other general symptoms and signs: Secondary | ICD-10-CM

## 2017-10-05 DIAGNOSIS — R112 Nausea with vomiting, unspecified: Secondary | ICD-10-CM | POA: Insufficient documentation

## 2017-10-05 LAB — POCT INFLUENZA A/B
Influenza A, POC: NEGATIVE
Influenza B, POC: NEGATIVE

## 2017-10-05 MED ORDER — OSELTAMIVIR PHOSPHATE 75 MG PO CAPS: 75.0000 mg | ORAL_CAPSULE | Freq: Two times a day (BID) | ORAL | 0 refills | Status: DC

## 2017-10-05 NOTE — Progress Notes (Signed)
BP 138/70 (BP Location: Right Arm, Patient Position: Sitting, Cuff Size: Large)   Pulse (!) 105   Temp 98.3 F (36.8 C) (Oral)   Wt 197 lb 1.6 oz (89.4 kg)   SpO2 97%   BMI 33.83 kg/m    Subjective:    Patient ID: Brittany Hill, female    DOB: October 20, 1950, 67 y.o.   MRN: [id number]  HPI: Brittany Hill is a 67 y.o. female  Chief Complaint  Patient presents with  . Influenza    Pt states that husband has flu, just began to feel bad yesterday     HPI Patient is here for an acute visit Symptoms started yesterday afternoon, tickle in the throat Having body aches; more sweating, not chills Naprosyn for fever; 100 degrees today Nausea, but no vomiting; no diarrhea No travel Husband has been sick with flu A; he went to the doctor on Wednesday Husband taking Tamiflu  Depression screen [facility] 2/9 08/02/2017 10/27/2016 07/28/2016 06/14/2016 12/16/2015  Decreased Interest 1 0 0 0 0  Down, Depressed, Hopeless 3 0 0 0 0  PHQ - 2 Score 4 0 0 0 0  Altered sleeping 2 - - - -  Tired, decreased energy 3 - - - -  Change in appetite 2 - - - -  Feeling bad or failure about yourself  1 - - - -  Trouble concentrating 2 - - - -  Moving slowly or fidgety/restless 0 - - - -  Suicidal thoughts 0 - - - -  PHQ-9 Score 14 - - - -  Difficult doing work/chores Somewhat difficult - - - -    Relevant past medical, surgical, family and social history reviewed Past Medical History:  Diagnosis Date  . Anxiety   . Asthma    as a teenager  . Chronic constipation   . Depression   . Dysrhythmia   . Fibromyalgia    Bells Palsy  . Full code status 08/02/2017   Patient wishes to have resuscitative efforts made if collapses; but does not want to remain on life support or if condition is terminal -- see copy of living will  . Genetic testing 08/28/2017   Multi-Cancer panel (83 genes) @ [facility] - No pathogenic mutations detected  . GERD (gastroesophageal reflux disease)   . Heart murmur   .  Hyperlipidemia   . Hypertension   . Hypothyroidism   . IFG (impaired fasting glucose)   . MS (multiple sclerosis) (HCC)    hx of long-term corticosteroid use  . OA (osteoarthritis)    osteoporosis  . Osteoporosis 05/2014   managed by Rheumatologist  . Pneumonia 2017  . TMJ syndrome    Past Surgical History:  Procedure Laterality Date  . ABDOMINAL HYSTERECTOMY     complete at age 39; HRT for ~25y  . CATARACT EXTRACTION W/PHACO Left 09/27/2017   Procedure: CATARACT EXTRACTION PHACO AND INTRAOCULAR LENS PLACEMENT ([facility]);  Surgeon: [place] Crane, MD;  Location: [facility] ORS;  Service: Ophthalmology;  Laterality: Left;  Lot #[id number] H [place]:   00:27.2 AP%:  13.7 CDE:  3.75  . KNEE ARTHROSCOPY Right    x 2  . lumbarectomy with fusion  [id number]  . ROTATOR CUFF REPAIR    . tear duct      surgery  . TMJ ARTHROPLASTY    . TOTAL KNEE ARTHROPLASTY Bilateral 09/2007   Family History  Problem Relation Age of Onset  . Heart disease Mother   . Hypertension Mother   .  Lymphoma Mother 35       deceased 63  . Stroke Father   . Heart attack Father   . Heart disease Father   . Hypertension Father   . Alcohol abuse Father   . Hypertension Sister   . Colon cancer Sister 23  . Hypertension Brother   . Diabetes Brother   . Mental illness Brother   . Depression Brother   . Lung cancer Brother 36       smoker; currently 67  . Alcohol abuse Son   . Breast cancer Maternal Aunt 75       currently 30  . Breast cancer Maternal Aunt        dx 30s; mets in 86s  . AAA (abdominal aortic aneurysm) Maternal Grandmother   . Heart disease Maternal Grandmother   . Heart attack Paternal Grandfather   . Throat cancer Paternal Uncle        smoker   Social History   Tobacco Use  . Smoking status: Never Smoker  . Smokeless tobacco: Never Used  Substance Use Topics  . Alcohol use: No    Alcohol/week: 0.0 oz  . Drug use: No    Interim medical history since last visit reviewed. Allergies and  medications reviewed  Review of Systems Per HPI unless specifically indicated above     Objective:    BP 138/70 (BP Location: Right Arm, Patient Position: Sitting, Cuff Size: Large)   Pulse (!) 105   Temp 98.3 F (36.8 C) (Oral)   Wt 197 lb 1.6 oz (89.4 kg)   SpO2 97%   BMI 33.83 kg/m   Wt Readings from Last 3 Encounters:  10/05/17 197 lb 1.6 oz (89.4 kg)  09/27/17 192 lb (87.1 kg)  08/02/17 195 lb (88.5 kg)    Physical Exam  Constitutional: She appears well-developed and well-nourished.  Appears to not feel well but nontoxic  HENT:  Right Ear: No tenderness. Tympanic membrane is not erythematous.  Left Ear: No tenderness. Tympanic membrane is not erythematous.  Nose: Rhinorrhea present.  Mouth/Throat: Mucous membranes are normal. No posterior oropharyngeal edema or posterior oropharyngeal erythema.  Eyes: EOM are normal. No scleral icterus.  Cardiovascular: Normal rate and regular rhythm.  Pulmonary/Chest: Effort normal and breath sounds normal.  Neurological: She is alert.  Skin: She is not diaphoretic. No pallor.  Psychiatric: She has a normal mood and affect. Her behavior is normal.    Results for orders placed or performed in visit on 10/05/17  POCT Influenza A/B  Result Value Ref Range   Influenza A, POC Negative Negative   Influenza B, POC Negative Negative      Assessment & Plan:   Problem List Items Addressed This Visit    None    Visit Diagnoses    Flu-like symptoms    -  Primary   discussed ddx; will start tamiflu even with negative test resutls; rest, hydration, reasons to seek med care reviewed; see AVS   Relevant Orders   POCT Influenza A/B (Completed)       Follow up plan: No Follow-up on file.  An after-visit summary was printed and given to the patient at check-out.  Please see the patient instructions which may contain other information and recommendations beyond what is mentioned above in the assessment and plan.  Meds ordered this  encounter  Medications  . oseltamivir (TAMIFLU) 75 MG capsule    Sig: Take 1 capsule (75 mg total) by mouth 2 (two) times daily.  Dispense:  10 capsule    Refill:  0    Orders Placed This Encounter  Procedures  . POCT Influenza A/B

## 2017-10-05 NOTE — Patient Instructions (Addendum)
You are contagious  Influenza, Adult Influenza ("the flu") is an infection in the lungs, nose, and throat (respiratory tract). It is caused by a virus. The flu causes many common cold symptoms, as well as a high fever and body aches. It can make you feel very sick. The flu spreads easily from person to person (is contagious). Getting a flu shot (influenza vaccination) every year is the best way to prevent the flu. Follow these instructions at home:  Take over-the-counter and prescription medicines only as told by your doctor.  Use a cool mist humidifier to add moisture (humidity) to the air in your home. This can make it easier to breathe.  Rest as needed.  Drink enough fluid to keep your pee (urine) clear or pale yellow.  Cover your mouth and nose when you cough or sneeze.  Wash your hands with soap and water often, especially after you cough or sneeze. If you cannot use soap and water, use hand sanitizer.  Stay home from work or school as told by your doctor. Unless you are visiting your doctor, try to avoid leaving home until your fever has been gone for 24 hours without the use of medicine.  Keep all follow-up visits as told by your doctor. This is important. How is this prevented?  Getting a yearly (annual) flu shot is the best way to avoid getting the flu. You may get the flu shot in late summer, fall, or winter. Ask your doctor when you should get your flu shot.  Wash your hands often or use hand sanitizer often.  Avoid contact with people who are sick during cold and flu season.  Eat healthy foods.  Drink plenty of fluids.  Get enough sleep.  Exercise regularly. Contact a doctor if:  You get new symptoms.  You have: ? Chest pain. ? Watery poop (diarrhea). ? A fever.  Your cough gets worse.  You start to have more mucus.  You feel sick to your stomach (nauseous).  You throw up (vomit). Get help right away if:  You start to be short of breath or have  trouble breathing.  Your skin or nails turn a bluish color.  You have very bad pain or stiffness in your neck.  You get a sudden headache.  You get sudden pain in your face or ear.  You cannot stop throwing up. This information is not intended to replace advice given to you by your health care provider. Make sure you discuss any questions you have with your health care provider. Document Released: 05/02/2008 Document Revised: 12/30/2015 Document Reviewed: 05/18/2015 Elsevier Interactive Patient Education  2017 [employer].

## 2017-10-17 ENCOUNTER — Other Ambulatory Visit: Payer: Self-pay | Admitting: Family Medicine

## 2017-10-18 ENCOUNTER — Encounter: Payer: Self-pay | Admitting: *Deleted

## 2017-10-25 ENCOUNTER — Encounter: Payer: Self-pay | Admitting: Ophthalmology

## 2017-10-25 ENCOUNTER — Ambulatory Visit: Payer: Medicare Other | Admitting: Anesthesiology

## 2017-10-25 ENCOUNTER — Encounter
Admission: RE | Disposition: A | Discharge status: Home / Self Care | Payer: Self-pay | Source: Ambulatory Visit | Attending: Ophthalmology

## 2017-10-25 ENCOUNTER — Ambulatory Visit
Admission: RE | Admit: 2017-10-25 | Discharge: 2017-10-25 | Disposition: A | Discharge status: Home / Self Care | Payer: Medicare Other | Source: Ambulatory Visit | Attending: Ophthalmology | Admitting: Ophthalmology

## 2017-10-25 DIAGNOSIS — F329 Major depressive disorder, single episode, unspecified: Secondary | ICD-10-CM | POA: Insufficient documentation

## 2017-10-25 DIAGNOSIS — M797 Fibromyalgia: Secondary | ICD-10-CM | POA: Insufficient documentation

## 2017-10-25 DIAGNOSIS — Z79899 Other long term (current) drug therapy: Secondary | ICD-10-CM | POA: Insufficient documentation

## 2017-10-25 DIAGNOSIS — K219 Gastro-esophageal reflux disease without esophagitis: Secondary | ICD-10-CM | POA: Diagnosis not present

## 2017-10-25 DIAGNOSIS — M810 Age-related osteoporosis without current pathological fracture: Secondary | ICD-10-CM | POA: Insufficient documentation

## 2017-10-25 DIAGNOSIS — Z79890 Hormone replacement therapy (postmenopausal): Secondary | ICD-10-CM | POA: Diagnosis not present

## 2017-10-25 DIAGNOSIS — Z791 Long term (current) use of non-steroidal anti-inflammatories (NSAID): Secondary | ICD-10-CM | POA: Insufficient documentation

## 2017-10-25 DIAGNOSIS — G35 Multiple sclerosis: Secondary | ICD-10-CM | POA: Diagnosis not present

## 2017-10-25 DIAGNOSIS — E039 Hypothyroidism, unspecified: Secondary | ICD-10-CM | POA: Insufficient documentation

## 2017-10-25 DIAGNOSIS — I10 Essential (primary) hypertension: Secondary | ICD-10-CM | POA: Insufficient documentation

## 2017-10-25 DIAGNOSIS — H2511 Age-related nuclear cataract, right eye: Secondary | ICD-10-CM | POA: Insufficient documentation

## 2017-10-25 HISTORY — DX: Other complications of anesthesia, initial encounter: T88.59XA

## 2017-10-25 HISTORY — DX: Nausea with vomiting, unspecified: R11.2

## 2017-10-25 HISTORY — PX: CATARACT EXTRACTION W/PHACO: SHX586

## 2017-10-25 HISTORY — DX: Adverse effect of unspecified anesthetic, initial encounter: T41.45XA

## 2017-10-25 HISTORY — DX: Other specified postprocedural states: Z98.890

## 2017-10-25 SURGERY — PHACOEMULSIFICATION, CATARACT, WITH IOL INSERTION
Anesthesia: Monitor Anesthesia Care | Site: Eye | Laterality: Right | Wound class: "Clean "

## 2017-10-25 MED ORDER — FENTANYL CITRATE (PF) 100 MCG/2ML IJ SOLN
INTRAMUSCULAR | Status: AC
  Filled 2017-10-25: qty 2

## 2017-10-25 MED ORDER — BSS IO SOLN
INTRAOCULAR | Status: DC | PRN
  Administered 2017-10-25: 1 via INTRAOCULAR

## 2017-10-25 MED ORDER — MIDAZOLAM HCL 2 MG/2ML IJ SOLN
INTRAMUSCULAR | Status: AC
  Filled 2017-10-25: qty 2

## 2017-10-25 MED ORDER — MOXIFLOXACIN HCL 0.5 % OP SOLN
OPHTHALMIC | Status: AC
  Filled 2017-10-25: qty 3

## 2017-10-25 MED ORDER — SODIUM HYALURONATE 23 MG/ML IO SOLN
INTRAOCULAR | Status: AC
  Filled 2017-10-25: qty 0.6

## 2017-10-25 MED ORDER — ARMC OPHTHALMIC DILATING DROPS
1.0000 "application " | OPHTHALMIC | Status: AC
  Administered 2017-10-25 (×3): 1 via OPHTHALMIC

## 2017-10-25 MED ORDER — MOXIFLOXACIN HCL 0.5 % OP SOLN: 1.0000 [drp] | OPHTHALMIC | Status: DC | PRN

## 2017-10-25 MED ORDER — LIDOCAINE HCL (PF) 4 % IJ SOLN
INTRAMUSCULAR | Status: AC
  Filled 2017-10-25: qty 5

## 2017-10-25 MED ORDER — SODIUM HYALURONATE 23 MG/ML IO SOLN
INTRAOCULAR | Status: DC | PRN
  Administered 2017-10-25: 0.6 mL via INTRAOCULAR

## 2017-10-25 MED ORDER — FENTANYL CITRATE (PF) 100 MCG/2ML IJ SOLN
INTRAMUSCULAR | Status: DC | PRN
  Administered 2017-10-25: 50 ug via INTRAVENOUS

## 2017-10-25 MED ORDER — MIDAZOLAM HCL 2 MG/2ML IJ SOLN
INTRAMUSCULAR | Status: DC | PRN
  Administered 2017-10-25: 1 mg via INTRAVENOUS

## 2017-10-25 MED ORDER — MOXIFLOXACIN HCL 0.5 % OP SOLN
OPHTHALMIC | Status: DC | PRN
  Administered 2017-10-25: 0.2 mL via OPHTHALMIC

## 2017-10-25 MED ORDER — SODIUM CHLORIDE 0.9 % IV SOLN
INTRAVENOUS | Status: DC
  Administered 2017-10-25: 08:00:00 via INTRAVENOUS

## 2017-10-25 MED ORDER — SODIUM HYALURONATE 10 MG/ML IO SOLN
INTRAOCULAR | Status: DC | PRN
  Administered 2017-10-25: 0.55 mL via INTRAOCULAR

## 2017-10-25 MED ORDER — ARMC OPHTHALMIC DILATING DROPS
OPHTHALMIC | Status: AC
  Administered 2017-10-25: 1 via OPHTHALMIC
  Filled 2017-10-25: qty 0.4

## 2017-10-25 MED ORDER — EPINEPHRINE PF 1 MG/ML IJ SOLN
INTRAMUSCULAR | Status: AC
  Filled 2017-10-25: qty 1

## 2017-10-25 MED ORDER — LIDOCAINE HCL (PF) 4 % IJ SOLN
INTRAOCULAR | Status: DC | PRN
  Administered 2017-10-25: 2 mL via OPHTHALMIC

## 2017-10-25 MED ORDER — POVIDONE-IODINE 5 % OP SOLN
OPHTHALMIC | Status: DC | PRN
  Administered 2017-10-25: 1 via OPHTHALMIC

## 2017-10-25 MED ORDER — POVIDONE-IODINE 5 % OP SOLN
OPHTHALMIC | Status: AC
  Filled 2017-10-25: qty 30

## 2017-10-25 SURGICAL SUPPLY — 16 items
DISSECTOR HYDRO NUCLEUS 50X22 (MISCELLANEOUS) ×3 IMPLANT
GLOVE BIO SURGEON STRL SZ8 (GLOVE) ×3 IMPLANT
GLOVE BIOGEL M 6.5 STRL (GLOVE) ×3 IMPLANT
GLOVE SURG LX 7.5 STRW (GLOVE) ×2
GLOVE SURG LX STRL 7.5 STRW (GLOVE) ×1 IMPLANT
GOWN STRL REUS W/ TWL LRG LVL3 (GOWN DISPOSABLE) ×2 IMPLANT
GOWN STRL REUS W/TWL LRG LVL3 (GOWN DISPOSABLE) ×4
LABEL CATARACT MEDS ST (LABEL) ×3 IMPLANT
LENS IOL TECNIS SYMFONY 22.5 ×2 IMPLANT
PACK CATARACT (MISCELLANEOUS) ×3 IMPLANT
PACK CATARACT KING (MISCELLANEOUS) ×3 IMPLANT
PACK EYE AFTER SURG (MISCELLANEOUS) ×3 IMPLANT
SOL BSS BAG (MISCELLANEOUS) ×3
SOLUTION BSS BAG (MISCELLANEOUS) ×1 IMPLANT
WATER STERILE IRR 250ML POUR (IV SOLUTION) ×3 IMPLANT
WIPE NON LINTING 3.25X3.25 (MISCELLANEOUS) ×3 IMPLANT

## 2017-10-25 NOTE — Anesthesia Preprocedure Evaluation (Signed)
Anesthesia Evaluation  Patient identified by MRN, date of birth, ID band Patient awake    Reviewed: Allergy & Precautions, NPO status , Patient's Chart, lab work & pertinent test results, reviewed documented beta blocker date and time   History of Anesthesia Complications (+) PONV and history of anesthetic complications (none with last cataract)  Airway Mallampati: III       Dental   Pulmonary asthma , neg sleep apnea, neg COPD,           Cardiovascular hypertension, Pt. on medications and Pt. on home beta blockers (-) Past MI and (-) CHF + dysrhythmias (tachycardia) + Valvular Problems/Murmurs (murmur, no tx)      Neuro/Psych neg Seizures Anxiety Depression    GI/Hepatic Neg liver ROS, GERD  Medicated and Controlled,  Endo/Other  neg diabetesHypothyroidism   Renal/GU negative Renal ROS     Musculoskeletal   Abdominal   Peds  Hematology   Anesthesia Other Findings   Reproductive/Obstetrics                             Anesthesia Physical Anesthesia Plan  ASA: III  Anesthesia Plan: MAC   Post-op Pain Management:    Induction:   PONV Risk Score and Plan:   Airway Management Planned:   Additional Equipment:   Intra-op Plan:   Post-operative Plan:   Informed Consent: I have reviewed the patients History and Physical, chart, labs and discussed the procedure including the risks, benefits and alternatives for the proposed anesthesia with the patient or authorized representative who has indicated his/her understanding and acceptance.     Plan Discussed with:   Anesthesia Plan Comments:         Anesthesia Quick Evaluation

## 2017-10-25 NOTE — Op Note (Signed)
OPERATIVE NOTE  Brittany Hill [id number] 10/25/2017   PREOPERATIVE DIAGNOSIS:  Nuclear sclerotic cataract right eye.  H25.11   POSTOPERATIVE DIAGNOSIS:    Nuclear sclerotic cataract right eye.     PROCEDURE:  Phacoemusification with posterior chamber intraocular lens placement of the right eye   LENS:   Implant Name Type Inv. Item Serial No. Manufacturer Lot No. LRB No. Used  LENS IOL SYMFONY 22.5 - [id number]  LENS IOL SYMFONY 22.5 [id number] AMO  Right 1       [id number] +22.5 Symfony enhanced depth of focus lens.   ULTRASOUND TIME: 0 minutes 23.9 seconds.  CDE 2.10   SURGEON:  Willey Blade, MD, MPH  ANESTHESIOLOGIST: Anesthesiologist: Naomie Dean, MD CRNA: Henrietta Hoover, CRNA; Danelle Berry, CRNA   ANESTHESIA:  Topical with tetracaine drops augmented with 1% preservative-free intracameral lidocaine.  ESTIMATED BLOOD LOSS: less than 1 mL.   COMPLICATIONS:  None.   DESCRIPTION OF PROCEDURE:  The patient was identified in the holding room and transported to the operating room and placed in the supine position under the operating microscope.  The right eye was identified as the operative eye and it was prepped and draped in the usual sterile ophthalmic fashion.   A 1.0 millimeter clear-corneal paracentesis was made at the 10:30 position. 0.5 ml of preservative-free 1% lidocaine with epinephrine was injected into the anterior chamber.  The anterior chamber was filled with Healon 5 viscoelastic.  A 2.4 millimeter keratome was used to make a near-clear corneal incision at the 8:00 position.  A curvilinear capsulorrhexis was made with a cystotome and capsulorrhexis forceps.  Balanced salt solution was used to hydrodissect and hydrodelineate the nucleus.   Phacoemulsification was then used in stop and chop fashion to remove the lens nucleus and epinucleus.  The remaining cortex was then removed using the irrigation and aspiration handpiece. Healon was then placed into the  capsular bag to distend it for lens placement.  A lens was then injected into the capsular bag.  The remaining viscoelastic was aspirated.   Wounds were hydrated with balanced salt solution.  The anterior chamber was inflated to a physiologic pressure with balanced salt solution.   Intracameral vigamox 0.1 mL undiluted was injected into the eye and a drop placed onto the ocular surface.  No wound leaks were noted.  The patient was taken to the recovery room in stable condition without complications of anesthesia or surgery  Willey Blade 10/25/2017, 9:02 AM

## 2017-10-25 NOTE — Transfer of Care (Signed)
Immediate Anesthesia Transfer of Care Note  Patient: Brittany Hill  Procedure(s) Performed: CATARACT EXTRACTION PHACO AND INTRAOCULAR LENS PLACEMENT ([facility]) (Right Eye)  Patient Location: PACU  Anesthesia Type:MAC  Level of Consciousness: awake, alert  and oriented  Airway & Oxygen Therapy: Patient Spontanous Breathing  Post-op Assessment: Report given to RN  Post vital signs: Reviewed and stable  Last Vitals:  Vitals Value Taken Time  BP    Temp    Pulse    Resp    SpO2      Last Pain:  Vitals:   10/25/17 0744  TempSrc: Oral         Complications: No apparent anesthesia complications

## 2017-10-25 NOTE — Discharge Instructions (Signed)
AMBULATORY SURGERY  °DISCHARGE INSTRUCTIONS ° ° °1) The drugs that you were given will stay in your system until tomorrow so for the next 24 hours you should not: ° °A) Drive an automobile °B) Make any legal decisions °C) Drink any alcoholic beverage ° ° °2) You may resume regular meals tomorrow.  Today it is better to start with liquids and gradually work up to solid foods. ° °You may eat anything you prefer, but it is better to start with liquids, then soup and crackers, and gradually work up to solid foods. ° ° °3) Please notify your doctor immediately if you have any unusual bleeding, trouble breathing, redness and pain at the surgery site, drainage, fever, or pain not relieved by medication. ° ° ° °4) Additional Instructions: ° ° ° ° ° ° ° °Please contact your physician with any problems or Same Day Surgery at [phone], Monday through Friday 6 am to 4 pm, or [place] [facility] [place] Main number at [phone].Eye Surgery Discharge Instructions ° °Expect mild scratchy sensation or mild soreness. °DO NOT RUB YOUR EYE! ° °The day of surgery: °• Minimal physical activity, but bed rest is not required °• No reading, computer work, or close hand work °• No bending, lifting, or straining. °• May watch TV ° °For 24 hours: °• No driving, legal decisions, or alcoholic beverages °• Safety precautions °• Eat anything you prefer: It is better to start with liquids, then soup then solid foods. °• _____ Eye patch should be worn until postoperative exam tomorrow. °• ____ Solar shield eyeglasses should be worn for comfort in the sunlight/patch while sleeping ° °Resume all regular medications including aspirin or Coumadin if these were discontinued prior to surgery. °You may shower, bathe, shave, or wash your hair. °Tylenol may be taken for mild discomfort. ° °Call your doctor if you experience significant pain, nausea, or vomiting, fever > 101 or other signs of infection. [phone] or [phone] °Specific  instructions: ° ° °

## 2017-10-25 NOTE — Anesthesia Postprocedure Evaluation (Signed)
Anesthesia Post Note  Patient: Brittany Hill  Procedure(s) Performed: CATARACT EXTRACTION PHACO AND INTRAOCULAR LENS PLACEMENT ([place]) (Right Eye)  Patient location during evaluation: PACU Anesthesia Type: MAC Level of consciousness: awake, awake and alert and oriented Respiratory status: spontaneous breathing Cardiovascular status: blood pressure returned to baseline Postop Assessment: no headache Anesthetic complications: no     Last Vitals:  Vitals Value Taken Time  BP    Temp    Pulse    Resp    SpO2      Last Pain:  Vitals:   10/25/17 0744  TempSrc: Oral                 Philbert Riser

## 2017-10-25 NOTE — H&P (Signed)
The History and Physical notes are on paper, have been signed, and are to be scanned.   I have examined the patient and there are no changes to the H&P.   Willey Blade 10/25/2017 8:29 AM

## 2017-10-25 NOTE — Anesthesia Procedure Notes (Signed)
Procedure Name: MAC Date/Time: 10/25/2017 8:40 AM Performed by: Philbert Riser, CRNA Pre-anesthesia Checklist: Patient identified, Emergency Drugs available, Suction available, Patient being monitored and Timeout performed Patient Re-evaluated:Patient Re-evaluated prior to induction Oxygen Delivery Method: Nasal cannula Placement Confirmation: positive ETCO2

## 2017-10-25 NOTE — Anesthesia Post-op Follow-up Note (Signed)
Anesthesia QCDR form completed.        

## 2017-11-19 ENCOUNTER — Other Ambulatory Visit: Payer: Self-pay | Admitting: Family Medicine

## 2017-12-05 HISTORY — PX: COLONOSCOPY WITH ESOPHAGOGASTRODUODENOSCOPY (EGD): SHX5779

## 2017-12-18 ENCOUNTER — Other Ambulatory Visit: Payer: Self-pay | Admitting: Family Medicine

## 2018-01-15 ENCOUNTER — Other Ambulatory Visit: Payer: Self-pay | Admitting: Family Medicine

## 2018-01-15 ENCOUNTER — Encounter: Payer: Self-pay | Admitting: Family Medicine

## 2018-01-18 ENCOUNTER — Other Ambulatory Visit: Payer: Self-pay | Admitting: Family Medicine

## 2018-01-18 DIAGNOSIS — E782 Mixed hyperlipidemia: Secondary | ICD-10-CM

## 2018-01-21 NOTE — Telephone Encounter (Signed)
Left detailed voicemail

## 2018-01-21 NOTE — Telephone Encounter (Signed)
Lab Results  Component Value Date   CHOL 168 03/21/2017   HDL 55 03/21/2017   LDLCALC 85 03/21/2017   TRIG 139 03/21/2017   CHOLHDL 3.1 03/21/2017   Lab Results  Component Value Date   TSH 1.25 03/21/2017   Please ask pt to schedule an appt to see me in August; this will be for her thyroid and cholesterol; we don't want to wait until December to check those labs; I'll approve a refill; thank you

## 2018-02-15 ENCOUNTER — Telehealth: Payer: Self-pay | Admitting: Family Medicine

## 2018-02-16 DIAGNOSIS — M19072 Primary osteoarthritis, left ankle and foot: Secondary | ICD-10-CM | POA: Insufficient documentation

## 2018-02-18 NOTE — Telephone Encounter (Signed)
Patient has been using muscle relaxers regularly for many months Please ask her to schedule an appointment with me to discuss; last refill

## 2018-02-18 NOTE — Telephone Encounter (Signed)
Spoke with Brittany Hill @ 1:58pm she scheduled appt for 03-11-18. Brittany Hill did state that she has been calling the office to schedule an appt but no one would return her call. Not sure what happened there but I did apologize

## 2018-03-11 ENCOUNTER — Ambulatory Visit: Payer: Medicare Other | Admitting: Family Medicine

## 2018-03-22 ENCOUNTER — Ambulatory Visit: Payer: Medicare Other | Admitting: Family Medicine

## 2018-03-22 ENCOUNTER — Other Ambulatory Visit: Payer: Self-pay | Admitting: Family Medicine

## 2018-03-22 MED ORDER — CYCLOBENZAPRINE HCL 10 MG PO TABS: 10.0000 mg | ORAL_TABLET | Freq: Three times a day (TID) | ORAL | 0 refills | Status: DC | PRN

## 2018-03-22 NOTE — Telephone Encounter (Signed)
Pt had to be R/S for next Thursday and she is completely out of her Flexeril. Pt is requesting for this to be filled today. Please advise.

## 2018-03-22 NOTE — Telephone Encounter (Signed)
Refill request for general medication: Flexeril 10 mg  Last office visit: 10/05/2017  Last physical exam: 09/29/2015  Follow-ups on file. 03/28/2018

## 2018-03-22 NOTE — Telephone Encounter (Signed)
Rx sent as requested.

## 2018-03-28 ENCOUNTER — Ambulatory Visit: Payer: Medicare Other | Admitting: Family Medicine

## 2018-03-28 ENCOUNTER — Encounter: Payer: Self-pay | Admitting: Family Medicine

## 2018-03-28 VITALS — BP 134/72 | HR 96 | Temp 98.3°F | Ht 64.0 in | Wt 205.8 lb

## 2018-03-28 DIAGNOSIS — R7301 Impaired fasting glucose: Secondary | ICD-10-CM

## 2018-03-28 DIAGNOSIS — I10 Essential (primary) hypertension: Secondary | ICD-10-CM

## 2018-03-28 DIAGNOSIS — E876 Hypokalemia: Secondary | ICD-10-CM | POA: Diagnosis not present

## 2018-03-28 DIAGNOSIS — Z5181 Encounter for therapeutic drug level monitoring: Secondary | ICD-10-CM

## 2018-03-28 DIAGNOSIS — E032 Hypothyroidism due to medicaments and other exogenous substances: Secondary | ICD-10-CM | POA: Diagnosis not present

## 2018-03-28 DIAGNOSIS — F32A Depression, unspecified: Secondary | ICD-10-CM

## 2018-03-28 DIAGNOSIS — F419 Anxiety disorder, unspecified: Secondary | ICD-10-CM

## 2018-03-28 DIAGNOSIS — K219 Gastro-esophageal reflux disease without esophagitis: Secondary | ICD-10-CM | POA: Diagnosis not present

## 2018-03-28 DIAGNOSIS — F329 Major depressive disorder, single episode, unspecified: Secondary | ICD-10-CM

## 2018-03-28 DIAGNOSIS — E782 Mixed hyperlipidemia: Secondary | ICD-10-CM

## 2018-03-28 MED ORDER — EZETIMIBE-SIMVASTATIN 10-40 MG PO TABS: 1.0000 | ORAL_TABLET | Freq: Every day | ORAL | 0 refills | Status: DC

## 2018-03-28 MED ORDER — RANITIDINE HCL 300 MG PO TABS: ORAL_TABLET | ORAL | 3 refills | Status: DC

## 2018-03-28 MED ORDER — SERTRALINE HCL 100 MG PO TABS: 100.0000 mg | ORAL_TABLET | Freq: Every day | ORAL | 1 refills | Status: DC

## 2018-03-28 NOTE — Assessment & Plan Note (Signed)
Prediabetes; earlier note about DM does not appear correct

## 2018-03-28 NOTE — Patient Instructions (Signed)
Let's get labs today If you have not heard anything from my staff in a week about any orders/referrals/studies from today, please contact us here to follow-up [phone] Check out the information at [email or web address].[email or web address] entitled "Nutrition for Weight Loss: What You Need to Know about Fad Diets" Try to lose between 1-2 pounds per week by taking in fewer calories and burning off more calories You can succeed by limiting portions, limiting foods dense in calories and fat, becoming more active, and drinking 8 glasses of water a day (64 ounces) Don't skip meals, especially breakfast, as skipping meals may alter your metabolism Do not use over-the-counter weight loss pills or gimmicks that claim rapid weight loss A healthy BMI (or body mass index) is between 18.5 and 24.9 You can calculate your ideal BMI at the NIH website [employer] Try to limit saturated fats in your diet (bologna, hot dogs, barbeque, cheeseburgers, hamburgers, steak, bacon, sausage, cheese, etc.) and get more fresh fruits, vegetables, and whole grains

## 2018-03-28 NOTE — Progress Notes (Signed)
BP 134/72   Pulse 96   Temp 98.3 F (36.8 C)   Ht 5\' 4"  (1.626 m)   Wt 205 lb 12.8 oz (93.4 kg)   SpO2 95%   BMI 35.33 kg/m    Subjective:    Patient ID: Brittany Hill, female    DOB: Oct 10, 1950, 67 y.o.   MRN: [id number]  HPI: Brittany Hill is a 67 y.o. female  Chief Complaint  Patient presents with  . Follow-up  . Medication Refill    HPI  Patient is here for f/u  She has prediabetes; no diabetes; brother has it but from agent orange No dry mouth, no blurred vision; eyes doing well with cataract surgery Lab Results  Component Value Date   HGBA1C 6.2 (H) 03/28/2018   Allergies are bothering her; taking allegra if really needed  Hypothyroidism; energy level is not good Lab Results  Component Value Date   TSH 1.11 03/28/2018   After the last visit, she got a call from Dr. Lady Gary; had an EKG done and he wasn't happy with her BP and started spironolactone; started 6 months ago or so; thinks interfering with thyroid medicine; mood has been a little down; gained 10 pounds; taking thyroid pill first, then waits an hour  GERD; taking omeprazole as much, only if needed; usually taking H2 blocker; no blood in stool; she had a problem with food getting caught in the upper chest; sees GI and she is going to have an EGD and colonoscopy; son has had stricture; going to have that done in 8 days  Depression screen [facility] 2/9 03/28/2018 08/02/2017 10/27/2016 07/28/2016 06/14/2016  Decreased Interest 1 1 0 0 0  Down, Depressed, Hopeless 1 3 0 0 0  PHQ - 2 Score 2 4 0 0 0  Altered sleeping 2 2 - - -  Tired, decreased energy 2 3 - - -  Change in appetite 2 2 - - -  Feeling bad or failure about yourself  0 1 - - -  Trouble concentrating 1 2 - - -  Moving slowly or fidgety/restless 1 0 - - -  Suicidal thoughts 0 0 - - -  PHQ-9 Score 10 14 - - -  Difficult doing work/chores Somewhat difficult Somewhat difficult - - -    Relevant past medical, surgical, family and social history  reviewed Past Medical History:  Diagnosis Date  . Anxiety   . Asthma    as a teenager  . Chronic constipation   . Complication of anesthesia   . Depression   . Dysrhythmia   . Fibromyalgia    Bells Palsy  . Full code status 08/02/2017   Patient wishes to have resuscitative efforts made if collapses; but does not want to remain on life support or if condition is terminal -- see copy of living will  . Genetic testing 08/28/2017   Multi-Cancer panel (83 genes) @ [facility] - No pathogenic mutations detected  . GERD (gastroesophageal reflux disease)   . Heart murmur   . Hyperlipidemia   . Hypertension   . Hypothyroidism   . IFG (impaired fasting glucose)   . MS (multiple sclerosis) (HCC)    hx of long-term corticosteroid use  . OA (osteoarthritis)    osteoporosis  . Osteoporosis 05/2014   managed by Rheumatologist  . Pneumonia 2017  . PONV (postoperative nausea and vomiting)   . TMJ syndrome    Past Surgical History:  Procedure Laterality Date  . ABDOMINAL HYSTERECTOMY  complete at age 78; HRT for ~25y  . BACK SURGERY    . CATARACT EXTRACTION W/PHACO Left 09/27/2017   Procedure: CATARACT EXTRACTION PHACO AND INTRAOCULAR LENS PLACEMENT (IOC);  Surgeon: Nevada Crane, MD;  Location: [facility] ORS;  Service: Ophthalmology;  Laterality: Left;  Lot #[id number] H [place]:   00:27.2 AP%:  13.7 CDE:  3.75  . CATARACT EXTRACTION W/PHACO Right 10/25/2017   Procedure: CATARACT EXTRACTION PHACO AND INTRAOCULAR LENS PLACEMENT (IOC);  Surgeon: Nevada Crane, MD;  Location: [facility] ORS;  Service: Ophthalmology;  Laterality: Right;  Lot # [id number] H [place]:   00:23.9 AP%   :8.7 CDE:   2.10  . Cataracts Bilateral    09/27/17 and 10/25/17  . JOINT REPLACEMENT    . KNEE ARTHROSCOPY Right    x 2  . lumbarectomy with fusion  [id number]  . ROTATOR CUFF REPAIR    . tear duct      surgery  . TMJ ARTHROPLASTY    . TOTAL KNEE ARTHROPLASTY Bilateral 09/2007   Family History  Problem Relation Age of Onset    . Heart disease Mother   . Hypertension Mother   . Lymphoma Mother 36       deceased 78  . Stroke Father   . Heart attack Father   . Heart disease Father   . Hypertension Father   . Alcohol abuse Father   . Hypertension Sister   . Colon cancer Sister 19  . Hypertension Brother   . Diabetes Brother   . Mental illness Brother   . Depression Brother   . Lung cancer Brother 50       smoker; currently 73  . Alcohol abuse Son   . Breast cancer Maternal Aunt 75       currently 41  . Breast cancer Maternal Aunt        dx 46s; mets in 24s  . AAA (abdominal aortic aneurysm) Maternal Grandmother   . Heart disease Maternal Grandmother   . Heart attack Paternal Grandfather   . Throat cancer Paternal Uncle        smoker   Social History   Tobacco Use  . Smoking status: Never Smoker  . Smokeless tobacco: Never Used  Substance Use Topics  . Alcohol use: No    Alcohol/week: 0.0 standard drinks  . Drug use: No    Interim medical history since last visit reviewed. Allergies and medications reviewed  Review of Systems Per HPI unless specifically indicated above     Objective:    BP 134/72   Pulse 96   Temp 98.3 F (36.8 C)   Ht 5\' 4"  (1.626 m)   Wt 205 lb 12.8 oz (93.4 kg)   SpO2 95%   BMI 35.33 kg/m   Wt Readings from Last 3 Encounters:  03/28/18 205 lb 12.8 oz (93.4 kg)  10/25/17 192 lb (87.1 kg)  10/05/17 197 lb 1.6 oz (89.4 kg)    Physical Exam  Constitutional: She appears well-developed and well-nourished. No distress.  Weight gain noted  HENT:  Head: Normocephalic and atraumatic.  Eyes: EOM are normal. No scleral icterus.  Neck: No thyromegaly present.  Cardiovascular: Normal rate, regular rhythm and normal heart sounds.  No murmur heard. Pulmonary/Chest: Effort normal and breath sounds normal. No respiratory distress. She has no wheezes.  Abdominal: Soft. Bowel sounds are normal. She exhibits no distension.  Musculoskeletal: She exhibits no edema.   Neurological: She is alert.  Skin: Skin is warm and dry. She is not  diaphoretic. No pallor.  Psychiatric: She has a normal mood and affect. Her behavior is normal. Judgment and thought content normal.       Assessment & Plan:   Problem List Items Addressed This Visit      Cardiovascular and Mediastinum   Hypertension (Chronic)   Relevant Medications   ezetimibe-simvastatin (VYTORIN) 10-40 MG tablet     Digestive   GERD (gastroesophageal reflux disease) (Chronic)   Relevant Medications   ranitidine (ZANTAC) 300 MG tablet     Endocrine   IFG (impaired fasting glucose) - Primary (Chronic)    Prediabetes; earlier note about DM does not appear correct      Relevant Orders   Urine Microalbumin w/creat. ratio (Completed)   HgB A1c (Completed)   Hypothyroidism (Chronic)    Check TSH, given her weight gain      Relevant Orders   TSH (Completed)     Other   Depression   Relevant Medications   sertraline (ZOLOFT) 100 MG tablet   Anxiety   Relevant Medications   sertraline (ZOLOFT) 100 MG tablet   Hypokalemia   Medication monitoring encounter   Relevant Orders   COMPLETE METABOLIC PANEL WITH GFR (Completed)   CBC with Differential/Platelet (Completed)   Hyperlipidemia (Chronic)   Relevant Medications   ezetimibe-simvastatin (VYTORIN) 10-40 MG tablet   Other Relevant Orders   Lipid panel (Completed)   Hypomagnesemia   Relevant Orders   Magnesium (Completed)   Morbid obesity (HCC)    BMI > 35 with GERD, hypertension, high chol; encouraged weight loss; see AVS          Follow up plan: Return in about 6 months (around 09/28/2018).  An after-visit summary was printed and given to the patient at check-out.  Please see the patient instructions which may contain other information and recommendations beyond what is mentioned above in the assessment and plan.  Meds ordered this encounter  Medications  . sertraline (ZOLOFT) 100 MG tablet    Sig: Take 1 tablet (100 mg  total) by mouth daily.    Dispense:  90 tablet    Refill:  1    We're increasing the dose now  . ezetimibe-simvastatin (VYTORIN) 10-40 MG tablet    Sig: Take 1 tablet by mouth at bedtime.    Dispense:  90 tablet    Refill:  0  . ranitidine (ZANTAC) 300 MG tablet    Sig: One by mouth at bedtime daily    Dispense:  90 tablet    Refill:  3    Orders Placed This Encounter  Procedures  . Urine Microalbumin w/creat. ratio  . HgB A1c  . TSH  . Lipid panel  . COMPLETE METABOLIC PANEL WITH GFR  . CBC with Differential/Platelet  . Magnesium

## 2018-03-29 LAB — COMPLETE METABOLIC PANEL WITH GFR
AG Ratio: 2 (calc) (ref 1.0–2.5)
ALBUMIN MSPROF: 4.6 g/dL (ref 3.6–5.1)
ALKALINE PHOSPHATASE (APISO): 43 U/L (ref 33–130)
ALT: 22 U/L (ref 6–29)
AST: 23 U/L (ref 10–35)
BUN: 13 mg/dL (ref 7–25)
CALCIUM: 10.2 mg/dL (ref 8.6–10.4)
CO2: 25 mmol/L (ref 20–32)
CREATININE: 0.91 mg/dL (ref 0.50–0.99)
Chloride: 106 mmol/L (ref 98–110)
GFR, EST NON AFRICAN AMERICAN: 66 mL/min/{1.73_m2} (ref >=60)
GFR, Est African American: 76 mL/min/{1.73_m2} (ref >=60)
GLUCOSE: 95 mg/dL (ref 65–139)
Globulin: 2.3 g/dL (calc) (ref 1.9–3.7)
Potassium: 4.1 mmol/L (ref 3.5–5.3)
Sodium: 142 mmol/L (ref 135–146)
TOTAL PROTEIN: 6.9 g/dL (ref 6.1–8.1)
Total Bilirubin: 0.3 mg/dL (ref 0.2–1.2)

## 2018-03-29 LAB — CBC WITH DIFFERENTIAL/PLATELET
BASOS PCT: 0.5 %
Basophils Absolute: 37 cells/uL (ref 0–200)
Eosinophils Absolute: 219 cells/uL (ref 15–500)
Eosinophils Relative: 3 %
HCT: 39 % (ref 35.0–45.0)
HEMOGLOBIN: 13.1 g/dL (ref 11.7–15.5)
Lymphs Abs: 1986 cells/uL (ref 850–3900)
MCH: 29.6 pg (ref 27.0–33.0)
MCHC: 33.6 g/dL (ref 32.0–36.0)
MCV: 88.2 fL (ref 80.0–100.0)
MONOS PCT: 10.3 %
MPV: 10.7 fL (ref 7.5–12.5)
NEUTROS ABS: 4307 {cells}/uL (ref 1500–7800)
Neutrophils Relative %: 59 %
Platelets: 212 10*3/uL (ref 140–400)
RBC: 4.42 10*6/uL (ref 3.80–5.10)
RDW: 13.1 % (ref 11.0–15.0)
Total Lymphocyte: 27.2 %
WBC mixed population: 752 cells/uL (ref 200–950)
WBC: 7.3 10*3/uL (ref 3.8–10.8)

## 2018-03-29 LAB — LIPID PANEL
CHOLESTEROL: 149 mg/dL (ref <200)
HDL: 48 mg/dL — ABNORMAL LOW (ref >50)
LDL CHOLESTEROL (CALC): 63 mg/dL
Non-HDL Cholesterol (Calc): 101 mg/dL (calc) (ref <130)
Total CHOL/HDL Ratio: 3.1 (calc) (ref <5.0)
Triglycerides: 292 mg/dL — ABNORMAL HIGH (ref <150)

## 2018-03-29 LAB — HEMOGLOBIN A1C
Hgb A1c MFr Bld: 6.2 % of total Hgb — ABNORMAL HIGH (ref <5.7)
Mean Plasma Glucose: 131 (calc)
eAG (mmol/L): 7.3 (calc)

## 2018-03-29 LAB — MICROALBUMIN / CREATININE URINE RATIO
CREATININE, URINE: 65 mg/dL (ref 20–275)
MICROALB UR: 0.3 mg/dL
Microalb Creat Ratio: 5 mcg/mg creat (ref <30)

## 2018-03-29 LAB — TSH: TSH: 1.11 mIU/L (ref 0.40–4.50)

## 2018-03-29 LAB — MAGNESIUM: MAGNESIUM: 2.1 mg/dL (ref 1.5–2.5)

## 2018-04-01 NOTE — Assessment & Plan Note (Signed)
Check TSH, given her weight gain

## 2018-04-01 NOTE — Assessment & Plan Note (Signed)
BMI > 35 with GERD, hypertension, high chol; encouraged weight loss; see AVS

## 2018-04-02 ENCOUNTER — Encounter: Payer: Self-pay | Admitting: Nurse Practitioner

## 2018-04-02 DIAGNOSIS — H269 Unspecified cataract: Secondary | ICD-10-CM | POA: Insufficient documentation

## 2018-04-03 ENCOUNTER — Telehealth: Payer: Self-pay | Admitting: Family Medicine

## 2018-04-03 DIAGNOSIS — R7303 Prediabetes: Secondary | ICD-10-CM

## 2018-04-03 MED ORDER — METFORMIN HCL ER 500 MG PO TB24: 500.0000 mg | ORAL_TABLET | Freq: Every day | ORAL | 2 refills | Status: DC

## 2018-04-03 NOTE — Telephone Encounter (Signed)
Pt.notified

## 2018-04-03 NOTE — Telephone Encounter (Signed)
Sent to pharmacy. Start by taking one pill a day for one week then take start taking 2 pills a day after that. Common side effects are nausea, stomach upset and diarrhea. Starting on a lower dose and slowly increasing it helps prevent these symptoms. These symptoms often go away in a few days to weeks as your body becomes accustomed to this medications. Please let us know if you have any other questions or concerns or if you have any issues with this medication.

## 2018-04-03 NOTE — Telephone Encounter (Signed)
Copied from CRM [id number]. Topic: Quick Communication - See Telephone Encounter >> Apr 03, 2018  3:38 PM Oneal Grout wrote: CRM for notification. See Telephone encounter for: 04/03/18. Patient would like to move forward with starting metformin. [facility] Please advise

## 2018-04-04 ENCOUNTER — Encounter: Payer: Self-pay | Admitting: Family Medicine

## 2018-04-14 NOTE — Telephone Encounter (Signed)
Continue per NP plan

## 2018-04-17 ENCOUNTER — Other Ambulatory Visit: Payer: Self-pay | Admitting: Family Medicine

## 2018-04-17 DIAGNOSIS — E032 Hypothyroidism due to medicaments and other exogenous substances: Secondary | ICD-10-CM

## 2018-04-17 DIAGNOSIS — E782 Mixed hyperlipidemia: Secondary | ICD-10-CM

## 2018-04-17 NOTE — Telephone Encounter (Signed)
Lab Results  Component Value Date   TSH 1.11 03/28/2018   She should not be out of statin/zetia combo

## 2018-05-01 ENCOUNTER — Encounter: Payer: Self-pay | Admitting: Nurse Practitioner

## 2018-05-14 ENCOUNTER — Other Ambulatory Visit: Payer: Self-pay | Admitting: Family Medicine

## 2018-05-16 ENCOUNTER — Ambulatory Visit (INDEPENDENT_AMBULATORY_CARE_PROVIDER_SITE_OTHER): Payer: Medicare Other

## 2018-05-16 DIAGNOSIS — Z23 Encounter for immunization: Secondary | ICD-10-CM

## 2018-06-12 ENCOUNTER — Other Ambulatory Visit: Payer: Self-pay | Admitting: Family Medicine

## 2018-06-12 DIAGNOSIS — F32A Depression, unspecified: Secondary | ICD-10-CM

## 2018-06-12 DIAGNOSIS — F329 Major depressive disorder, single episode, unspecified: Secondary | ICD-10-CM

## 2018-06-12 DIAGNOSIS — F419 Anxiety disorder, unspecified: Secondary | ICD-10-CM

## 2018-07-16 ENCOUNTER — Other Ambulatory Visit: Payer: Self-pay | Admitting: Family Medicine

## 2018-07-16 DIAGNOSIS — E782 Mixed hyperlipidemia: Secondary | ICD-10-CM

## 2018-07-16 NOTE — Telephone Encounter (Signed)
Lab Results  Component Value Date   ALT 22 03/28/2018

## 2018-08-06 ENCOUNTER — Ambulatory Visit (INDEPENDENT_AMBULATORY_CARE_PROVIDER_SITE_OTHER): Payer: Medicare Other

## 2018-08-06 VITALS — BP 132/76 | HR 94 | Temp 98.5°F | Resp 16 | Ht 64.0 in | Wt 199.8 lb

## 2018-08-06 DIAGNOSIS — Z1231 Encounter for screening mammogram for malignant neoplasm of breast: Secondary | ICD-10-CM | POA: Diagnosis not present

## 2018-08-06 DIAGNOSIS — Z0000 Encounter for general adult medical examination without abnormal findings: Secondary | ICD-10-CM | POA: Diagnosis not present

## 2018-08-06 NOTE — Patient Instructions (Signed)
Brittany Hill , Thank you for taking time to come for your Medicare Wellness Visit. I appreciate your ongoing commitment to your health goals. Please review the following plan we discussed and let me know if I can assist you in the future.   Screening recommendations/referrals: Colonoscopy: done 01/10/18 repeat in 5 years Mammogram: done 08/20/17 Please call [phone] to schedule your mammogram.  Bone Density: done 09/21/16  Recommended yearly ophthalmology/optometry visit for glaucoma screening and checkup Recommended yearly dental visit for hygiene and checkup  Vaccinations: Influenza vaccine: done 05/16/18 Pneumococcal vaccine: done 08/02/17 Tdap vaccine: done 03/12/10 Shingles vaccine: Shingrix discussed. Please contact your pharmacy for coverage information.     Advanced directives: Please bring a copy of your health care power of attorney and living will to the office at your convenience.  Conditions/risks identified: Keep up the great work!   Next appointment: 09/27/18 11:00 Dr. Sherie DonLada for 6 month follow up   Preventive Care 65 Years and Older, Female Preventive care refers to lifestyle choices and visits with your health care provider that can promote health and wellness. What does preventive care include?  A yearly physical exam. This is also called an annual well check.  Dental exams once or twice a year.  Routine eye exams. Ask your health care provider how often you should have your eyes checked.  Personal lifestyle choices, including:  Daily care of your teeth and gums.  Regular physical activity.  Eating a healthy diet.  Avoiding tobacco and drug use.  Limiting alcohol use.  Practicing safe sex.  Taking low-dose aspirin every day.  Taking vitamin and mineral supplements as recommended by your health care provider. What happens during an annual well check? The services and screenings done by your health care provider during your annual well check will depend  on your age, overall health, lifestyle risk factors, and family history of disease. Counseling  Your health care provider may ask you questions about your:  Alcohol use.  Tobacco use.  Drug use.  Emotional well-being.  Home and relationship well-being.  Sexual activity.  Eating habits.  History of falls.  Memory and ability to understand (cognition).  Work and work Astronomerenvironment.  Reproductive health. Screening  You may have the following tests or measurements:  Height, weight, and BMI.  Blood pressure.  Lipid and cholesterol levels. These may be checked every 5 years, or more frequently if you are over 67 years old.  Skin check.  Lung cancer screening. You may have this screening every year starting at age 67 if you have a 30-pack-year history of smoking and currently smoke or have quit within the past 15 years.  Fecal occult blood test (FOBT) of the stool. You may have this test every year starting at age 67.  Flexible sigmoidoscopy or colonoscopy. You may have a sigmoidoscopy every 5 years or a colonoscopy every 10 years starting at age 67.  Hepatitis C blood test.  Hepatitis B blood test.  Sexually transmitted disease (STD) testing.  Diabetes screening. This is done by checking your blood sugar (glucose) after you have not eaten for a while (fasting). You may have this done every 1-3 years.  Bone density scan. This is done to screen for osteoporosis. You may have this done starting at age 67.  Mammogram. This may be done every 1-2 years. Talk to your health care provider about how often you should have regular mammograms. Talk with your health care provider about your test results, treatment options, and if necessary,  the need for more tests. Vaccines  Your health care provider may recommend certain vaccines, such as:  Influenza vaccine. This is recommended every year.  Tetanus, diphtheria, and acellular pertussis (Tdap, Td) vaccine. You may need a Td  booster every 10 years.  Zoster vaccine. You may need this after age 53.  Pneumococcal 13-valent conjugate (PCV13) vaccine. One dose is recommended after age 70.  Pneumococcal polysaccharide (PPSV23) vaccine. One dose is recommended after age 14. Talk to your health care provider about which screenings and vaccines you need and how often you need them. This information is not intended to replace advice given to you by your health care provider. Make sure you discuss any questions you have with your health care provider. Document Released: 08/20/2015 Document Revised: 04/12/2016 Document Reviewed: 05/25/2015 Elsevier Interactive Patient Education  2017 [place] Prevention in the Home Falls can cause injuries. They can happen to people of all ages. There are many things you can do to make your home safe and to help prevent falls. What can I do on the outside of my home?  Regularly fix the edges of walkways and driveways and fix any cracks.  Remove anything that might make you trip as you walk through a door, such as a raised step or threshold.  Trim any bushes or trees on the path to your home.  Use bright outdoor lighting.  Clear any walking paths of anything that might make someone trip, such as rocks or tools.  Regularly check to see if handrails are loose or broken. Make sure that both sides of any steps have handrails.  Any raised decks and porches should have guardrails on the edges.  Have any leaves, snow, or ice cleared regularly.  Use sand or salt on walking paths during winter.  Clean up any spills in your garage right away. This includes oil or grease spills. What can I do in the bathroom?  Use night lights.  Install grab bars by the toilet and in the tub and shower. Do not use towel bars as grab bars.  Use non-skid mats or decals in the tub or shower.  If you need to sit down in the shower, use a plastic, non-slip stool.  Keep the floor dry. Clean up  any water that spills on the floor as soon as it happens.  Remove soap buildup in the tub or shower regularly.  Attach bath mats securely with double-sided non-slip rug tape.  Do not have throw rugs and other things on the floor that can make you trip. What can I do in the bedroom?  Use night lights.  Make sure that you have a light by your bed that is easy to reach.  Do not use any sheets or blankets that are too big for your bed. They should not hang down onto the floor.  Have a firm chair that has side arms. You can use this for support while you get dressed.  Do not have throw rugs and other things on the floor that can make you trip. What can I do in the kitchen?  Clean up any spills right away.  Avoid walking on wet floors.  Keep items that you use a lot in easy-to-reach places.  If you need to reach something above you, use a strong step stool that has a grab bar.  Keep electrical cords out of the way.  Do not use floor polish or wax that makes floors slippery. If you must use  wax, use non-skid floor wax.  Do not have throw rugs and other things on the floor that can make you trip. What can I do with my stairs?  Do not leave any items on the stairs.  Make sure that there are handrails on both sides of the stairs and use them. Fix handrails that are broken or loose. Make sure that handrails are as long as the stairways.  Check any carpeting to make sure that it is firmly attached to the stairs. Fix any carpet that is loose or worn.  Avoid having throw rugs at the top or bottom of the stairs. If you do have throw rugs, attach them to the floor with carpet tape.  Make sure that you have a light switch at the top of the stairs and the bottom of the stairs. If you do not have them, ask someone to add them for you. What else can I do to help prevent falls?  Wear shoes that:  Do not have high heels.  Have rubber bottoms.  Are comfortable and fit you well.  Are  closed at the toe. Do not wear sandals.  If you use a stepladder:  Make sure that it is fully opened. Do not climb a closed stepladder.  Make sure that both sides of the stepladder are locked into place.  Ask someone to hold it for you, if possible.  Clearly mark and make sure that you can see:  Any grab bars or handrails.  First and last steps.  Where the edge of each step is.  Use tools that help you move around (mobility aids) if they are needed. These include:  Canes.  Walkers.  Scooters.  Crutches.  Turn on the lights when you go into a dark area. Replace any light bulbs as soon as they burn out.  Set up your furniture so you have a clear path. Avoid moving your furniture around.  If any of your floors are uneven, fix them.  If there are any pets around you, be aware of where they are.  Review your medicines with your doctor. Some medicines can make you feel dizzy. This can increase your chance of falling. Ask your doctor what other things that you can do to help prevent falls. This information is not intended to replace advice given to you by your health care provider. Make sure you discuss any questions you have with your health care provider. Document Released: 05/20/2009 Document Revised: 12/30/2015 Document Reviewed: 08/28/2014 Elsevier Interactive Patient Education  2017 [employer].

## 2018-08-06 NOTE — Progress Notes (Signed)
Subjective:   Brittany Hill is a 67 y.o. female who presents for Medicare Annual (Subsequent) preventive examination.  Review of Systems:   Cardiac Risk Factors include: advanced age (>69men, >50 women);hypertension;obesity (BMI >30kg/m2);dyslipidemia     Objective:     Vitals: BP 132/76 (BP Location: Left Arm, Patient Position: Sitting, Cuff Size: Normal)   Pulse 94   Temp 98.5 F (36.9 C) (Oral)   Resp 16   Ht 5\' 4"  (1.626 m)   Wt 199 lb 12.8 oz (90.6 kg)   SpO2 98%   BMI 34.30 kg/m   Body mass index is 34.3 kg/m.  Advanced Directives 08/06/2018 10/25/2017 09/27/2017 10/27/2016 07/28/2016 06/14/2016 12/02/2015  Does Patient Have a Medical Advance Directive? Yes Yes Yes Yes No No No  Type of Estate agent of [place];Living will Healthcare Power of [place];Living will Healthcare Power of [place];Living will - - - -  Does patient want to make changes to medical advance directive? - No - Patient declined No - Patient declined - - - -  Copy of Healthcare Power of Attorney in Chart? No - copy requested No - copy requested No - copy requested - - - -  Would patient like information on creating a medical advance directive? - - - - - No - patient declined information No - patient declined information    Tobacco Social History   Tobacco Use  Smoking Status Never Smoker  Smokeless Tobacco Never Used     Counseling given: Not Answered   Clinical Intake:  Pre-visit preparation completed: Yes  Pain : No/denies pain     Nutritional Status: BMI > 30  Obese Nutritional Risks: None Diabetes: No  How often do you need to have someone help you when you read instructions, pamphlets, or other written materials from your doctor or pharmacy?: 1 - Never What is the last grade level you completed in school?: master's degree  Interpreter Needed?: No  Information entered by :: Reather Littler LPN  Past Medical History:  Diagnosis Date  . Allergy   . Anxiety    . Asthma    as a teenager  . Chronic constipation   . Complication of anesthesia   . Depression   . Dysrhythmia   . Fibromyalgia    Bells Palsy  . Full code status 08/02/2017   Patient wishes to have resuscitative efforts made if collapses; but does not want to remain on life support or if condition is terminal -- see copy of living will  . Genetic testing 08/28/2017   Multi-Cancer panel (83 genes) @ [facility] - No pathogenic mutations detected  . GERD (gastroesophageal reflux disease)   . Heart murmur   . Hyperlipidemia   . Hypertension   . Hypothyroidism   . IFG (impaired fasting glucose)   . MS (multiple sclerosis) (HCC)    hx of long-term corticosteroid use  . OA (osteoarthritis)    osteoporosis  . Osteoporosis 05/2014   managed by Rheumatologist  . Pneumonia 2017  . PONV (postoperative nausea and vomiting)   . TMJ syndrome    Past Surgical History:  Procedure Laterality Date  . ABDOMINAL HYSTERECTOMY     complete at age 1; HRT for ~25y  . BACK SURGERY    . CATARACT EXTRACTION W/PHACO Left 09/27/2017   Procedure: CATARACT EXTRACTION PHACO AND INTRAOCULAR LENS PLACEMENT (IOC);  Surgeon: Nevada Crane, MD;  Location: [facility] ORS;  Service: Ophthalmology;  Laterality: Left;  Lot #[id number] H [place]:   00:27.2 AP%:  13.7 CDE:  3.75  . CATARACT EXTRACTION W/PHACO Right 10/25/2017   Procedure: CATARACT EXTRACTION PHACO AND INTRAOCULAR LENS PLACEMENT (IOC);  Surgeon: Nevada Crane, MD;  Location: [facility] ORS;  Service: Ophthalmology;  Laterality: Right;  Lot # [id number] H [place]:   00:23.9 AP%   :8.7 CDE:   2.10  . Cataracts Bilateral    09/27/17 and 10/25/17  . COLONOSCOPY WITH ESOPHAGOGASTRODUODENOSCOPY (EGD)  12/2017   [facility]  . JOINT REPLACEMENT    . KNEE ARTHROSCOPY Right    x 2  . lumbarectomy with fusion  [id number]  . ROTATOR CUFF REPAIR    . tear duct      surgery  . TMJ ARTHROPLASTY    . TOTAL KNEE ARTHROPLASTY Bilateral 09/2007   Family History    Problem Relation Age of Onset  . Heart disease Mother   . Hypertension Mother   . Lymphoma Mother 33       deceased 62  . Stroke Father   . Heart attack Father   . Heart disease Father   . Hypertension Father   . Alcohol abuse Father   . Hypertension Sister   . Colon cancer Sister 59  . Hypertension Brother   . Diabetes Brother   . Mental illness Brother   . Depression Brother   . Lung cancer Brother 22       smoker; currently 73  . Alcohol abuse Son   . Breast cancer Maternal Aunt 75       currently 78  . Breast cancer Maternal Aunt        dx 49s; mets in 94s  . AAA (abdominal aortic aneurysm) Maternal Grandmother   . Heart disease Maternal Grandmother   . Heart attack Paternal Grandfather   . Throat cancer Paternal Uncle        smoker   Social History   Socioeconomic History  . Marital status: Married    Spouse name: Not on file  . Number of children: 2  . Years of education: Not on file  . Highest education level: Master's degree (e.g., MA, MS, MEng, MEd, MSW, MBA)  Occupational History  . Occupation: retired  Engineer, production  . Financial resource strain: Not hard at all  . Food insecurity:    Worry: Never true    Inability: Never true  . Transportation needs:    Medical: No    Non-medical: No  Tobacco Use  . Smoking status: Never Smoker  . Smokeless tobacco: Never Used  Substance and Sexual Activity  . Alcohol use: No    Alcohol/week: 0.0 standard drinks  . Drug use: No  . Sexual activity: Not Currently  Lifestyle  . Physical activity:    Days per week: 2 days    Minutes per session: 60 min  . Stress: To some extent  Relationships  . Social connections:    Talks on phone: More than three times a week    Gets together: More than three times a week    Attends religious service: More than 4 times per year    Active member of club or organization: No    Attends meetings of clubs or organizations: Never    Relationship status: Married  Other Topics  Concern  . Not on file  Social History Narrative  . Not on file    Outpatient Encounter Medications as of 08/06/2018  Medication Sig  . calcium carbonate (CALCIUM 600) 600 MG TABS tablet Take 600 mg by mouth daily.  Marland Kitchen  cyclobenzaprine (FLEXERIL) 10 MG tablet Take 1 tablet (10 mg total) by mouth 3 (three) times daily as needed for muscle spasms.  Marland Kitchen EPINEPHrine 0.3 mg/0.3 mL IJ SOAJ injection Inject 0.3 mg into the muscle once.   . ezetimibe-simvastatin (VYTORIN) 10-40 MG tablet Take 1 tablet by mouth at bedtime.  . fexofenadine (ALLEGRA) 180 MG tablet Take 180 mg by mouth daily as needed for allergies or rhinitis.  Marland Kitchen levothyroxine (SYNTHROID) 100 MCG tablet Take 1 tablet (100 mcg total) by mouth daily before breakfast.  . metFORMIN (GLUCOPHAGE-XR) 500 MG 24 hr tablet Take 1 tablet (500 mg total) by mouth daily with breakfast. Take 1 tab a day for one week then take 2 tabs daily after that.  . metoprolol succinate (TOPROL-XL) 25 MG 24 hr tablet Take 1 tablet (25 mg total) by mouth daily. (Patient taking differently: Take 25 mg by mouth at bedtime. )  . naproxen sodium (ALEVE) 220 MG tablet Take 220-440 mg by mouth See admin instructions. Take 220 mg by mouth in the morning and take 440 mg by mouth at bedtime  . omeprazole (PRILOSEC) 20 MG capsule Take by mouth.  . sertraline (ZOLOFT) 100 MG tablet Take 1 tablet (100 mg total) by mouth daily.  Marland Kitchen spironolactone (ALDACTONE) 25 MG tablet Take 25 mg by mouth daily. With breakfast  . Vitamin D, Cholecalciferol, 50 MCG (2000 UT) CAPS Take by mouth.  . [DISCONTINUED] albuterol (PROVENTIL HFA;VENTOLIN HFA) 108 (90 Base) MCG/ACT inhaler Inhale 2 puffs into the lungs every 4 (four) hours as needed for wheezing or shortness of breath. (Patient not taking: Reported on 10/18/2017)  . [DISCONTINUED] azelastine (ASTELIN) 0.1 % nasal spray Place 1 spray into both nostrils 2 (two) times daily as needed for rhinitis.  . [DISCONTINUED] omeprazole (PRILOSEC) 20 MG  capsule Take 20 mg by mouth daily as needed (for acid reflux).  . [DISCONTINUED] ranitidine (ZANTAC) 300 MG tablet One by mouth at bedtime daily   No facility-administered encounter medications on file as of 08/06/2018.     Activities of Daily Living In your present state of health, do you have any difficulty performing the following activities: 08/06/2018 03/28/2018  Hearing? N N  Vision? N N  Difficulty concentrating or making decisions? N N  Walking or climbing stairs? N N  Dressing or bathing? N N  Doing errands, shopping? N N  Preparing Food and eating ? N -  Using the Toilet? N -  In the past six months, have you accidently leaked urine? Y -  Comment wears pads for protection -  Do you have problems with loss of bowel control? N -  Managing your Medications? N -  Managing your Finances? N -  Housekeeping or managing your Housekeeping? N -  Some recent data might be hidden    Patient Care Team: Lada, Janit Bern, MD as PCP - General (Family Medicine) Bettey Mare, MD as Referring Physician (Neurosurgery) Lady Gary Darlin Priestly, MD as Consulting Physician (Cardiology) Barbie Banner Cliffton Asters (Inactive) as Referring Physician    Assessment:   This is a routine wellness examination for Brittany Hill.  Exercise Activities and Dietary recommendations Current Exercise Habits: Structured exercise class, Type of exercise: Other - see comments(water aerobics), Time (Minutes): 60, Frequency (Times/Week): 2, Weekly Exercise (Minutes/Week): 120, Intensity: Moderate, Exercise limited by: neurologic condition(s);orthopedic condition(s)  Goals    . Weight (lb) < 185 lb (83.9 kg)     Over the next year    . Weight (lb) < 200 lb (90.7 kg)  Pt would like to lose 10 lbs over the next year       Fall Risk Fall Risk  08/06/2018 03/28/2018 08/02/2017 10/27/2016 07/28/2016  Falls in the past year? 0 No No No No  Number falls in past yr: 0 - - - -   FALL RISK PREVENTION PERTAINING TO THE HOME:  Any  stairs in or around the home WITH handrails? Yes  Home free of loose throw rugs in walkways, pet beds, electrical cords, etc? Yes  Adequate lighting in your home to reduce risk of falls? Yes   ASSISTIVE DEVICES UTILIZED TO PREVENT FALLS:  Life alert? No  Use of a cane, walker or w/c? No  Grab bars in the bathroom? Yes  Shower chair or bench in shower? Yes  Elevated toilet seat or a handicapped toilet? Yes   DME ORDERS:  DME order needed?  No   TIMED UP AND GO:  Was the test performed? Yes .  Length of time to ambulate 10 feet: 5 sec.   GAIT:  Appearance of gait: Gait stead-fast and without the use of an assistive device.   Education: Fall risk prevention has been discussed.  Intervention(s) required? No   Depression Screen PHQ 2/9 Scores 08/06/2018 03/28/2018 08/02/2017 10/27/2016  PHQ - 2 Score 0 2 4 0  PHQ- 9 Score 5 10 14  -     Cognitive Function     6CIT Screen 08/06/2018  What Year? 0 points  What month? 0 points  What time? 0 points  Count back from 20 0 points  Months in reverse 0 points  Repeat phrase 0 points  Total Score 0    Immunization History  Administered Date(s) Administered  . Influenza, High Dose Seasonal PF 05/16/2018  . Influenza,inj,Quad PF,6+ Mos 06/03/2015  . Influenza-Unspecified 05/17/2012, 05/10/2017  . Pneumococcal Polysaccharide-23 08/02/2017  . Td 06/19/1999  . Tdap 03/12/2010  . Zoster 08/08/2011    Qualifies for Shingles Vaccine? Yes  Zostavax completed 2013. Due for Shingrix. Education has been provided regarding the importance of this vaccine. Pt has been advised to call insurance company to determine out of pocket expense. Advised may also receive vaccine at local pharmacy or Health Dept. Verbalized acceptance and understanding.  Tdap: Up to date  Flu Vaccine: Up to date  Pneumococcal Vaccine: Up to date    Screening Tests Health Maintenance  Topic Date Due  . FOOT EXAM  05/09/1961  . OPHTHALMOLOGY EXAM  07/25/2018   . MAMMOGRAM  08/20/2018  . DEXA SCAN  09/21/2018  . HEMOGLOBIN A1C  09/28/2018  . URINE MICROALBUMIN  03/29/2019  . TETANUS/TDAP  03/12/2020  . COLONOSCOPY  01/11/2023  . INFLUENZA VACCINE  Completed  . PNA vac Low Risk Adult  Completed  . Hepatitis C Screening  Addressed    Cancer Screenings:  Colorectal Screening: Completed 01/10/18. Repeat every 5 years  Mammogram: Completed 08/20/17. Repeat every year; Ordered today. Pt provided with contact information and advised to call to schedule appt.   Bone Density: Completed 09/21/16. Results reflect  OSTEOPOROSIS. Repeat every 2 years. Pt currently on treatment for osteoporosis and decline bone density for next year.   Lung Cancer Screening: (Low Dose CT Chest recommended if Age 68-80 years, 30 pack-year currently smoking OR have quit w/in 15years.) does not qualify.     Additional Screening:  Hepatitis C Screening: does qualify; Completed 05/07/14  Vision Screening: Recommended annual ophthalmology exams for early detection of glaucoma and other disorders of the eye. Is the  patient up to date with their annual eye exam?  Yes  Who is the provider or what is the name of the office in which the pt attends annual eye exams? [facility]  Dental Screening: Recommended annual dental exams for proper oral hygiene  Community Resource Referral:  CRR required this visit?  No      Plan:    I have personally reviewed and addressed the Medicare Annual Wellness questionnaire and have noted the following in the patient's chart:  A. Medical and social history B. Use of alcohol, tobacco or illicit drugs  C. Current medications and supplements D. Functional ability and status E.  Nutritional status F.  Physical activity G. Advance directives H. List of other physicians I.  Hospitalizations, surgeries, and ER visits in previous 12 months J.  Vitals K. Screenings such as hearing and vision if needed, cognitive and  depression L. Referrals and appointments   In addition, I have reviewed and discussed with patient certain preventive protocols, quality metrics, and best practice recommendations. A written personalized care plan for preventive services as well as general preventive health recommendations were provided to patient.   Signed,  Reather Littler, LPN Nurse Health Advisor   Nurse Notes: none

## 2018-08-09 ENCOUNTER — Other Ambulatory Visit: Payer: Self-pay

## 2018-08-09 ENCOUNTER — Ambulatory Visit: Payer: Medicare Other | Admitting: Nurse Practitioner

## 2018-08-09 ENCOUNTER — Encounter: Payer: Self-pay | Admitting: Nurse Practitioner

## 2018-08-09 ENCOUNTER — Other Ambulatory Visit: Payer: Self-pay | Admitting: Family Medicine

## 2018-08-09 VITALS — BP 132/78 | HR 93 | Temp 98.1°F | Resp 16 | Ht 64.0 in | Wt 203.2 lb

## 2018-08-09 DIAGNOSIS — R7303 Prediabetes: Secondary | ICD-10-CM

## 2018-08-09 DIAGNOSIS — J0101 Acute recurrent maxillary sinusitis: Secondary | ICD-10-CM | POA: Diagnosis not present

## 2018-08-09 DIAGNOSIS — R059 Cough, unspecified: Secondary | ICD-10-CM

## 2018-08-09 DIAGNOSIS — R05 Cough: Secondary | ICD-10-CM | POA: Diagnosis not present

## 2018-08-09 MED ORDER — DOXYCYCLINE HYCLATE 100 MG PO TABS: 100.0000 mg | ORAL_TABLET | Freq: Two times a day (BID) | ORAL | 0 refills | Status: DC

## 2018-08-09 MED ORDER — METFORMIN HCL ER 500 MG PO TB24: 500.0000 mg | ORAL_TABLET | Freq: Two times a day (BID) | ORAL | 2 refills | Status: DC

## 2018-08-09 MED ORDER — BENZONATATE 200 MG PO CAPS: 200.0000 mg | ORAL_CAPSULE | Freq: Two times a day (BID) | ORAL | 0 refills | Status: DC | PRN

## 2018-08-09 NOTE — Patient Instructions (Addendum)
-   Please start taking antibiotic (doxycycline) twice a day with food. I do recommend taking a probiotic as well to replenish good gut health. Complete the entire course even if your symptoms are improving before it is completed to prevent antibiotic resistance - Continue corcidin HBP, can additionally take allegra to dry out sinuses, and musinex to help expectorate - If cough is not controlled with corcidin you can additionally take the perls sent to the pharmacy or stop corcidin and switch.  - Rest, drink plenty of water ( at least 64 ounces), good nutrition with plenty of vitamin C

## 2018-08-09 NOTE — Telephone Encounter (Signed)
Lab Results  Component Value Date   CREATININE 0.91 03/28/2018

## 2018-08-09 NOTE — Progress Notes (Signed)
Name: Brittany Hill   MRN: [id number]    DOB: 07/11/1951   Date:08/09/2018       Progress Note  Subjective  Chief Complaint  Chief Complaint  Patient presents with  . Sore Throat    OTC [place] HBP - started out last Sunday  . Cough    greenish sputum  . Chest Pain    from coughing     HPI Patient endorses sore throat started 2 weeks ago, then progressed to productive cough with green sputum. Endorses strong, frequent cough, now causing chest soreness with coughing. States has headache but has improved with medicine. Nasal congestion. Using humidifier. Feels she has fever because she breaks out in sweats shortly after taking tylenol. Bilateral ear pain, sore throat. Had sinus surgery 4 years ago- states amoxicillin has not worked in the past.   Has tried OTC coricidin cold medicine, humidifier.  Denies shortness of breath, no muffled sounds, jaw pain.   Patient Active Problem List   Diagnosis Date Noted  . Cataract, left 04/02/2018  . Morbid obesity (HCC) 04/01/2018  . Asthma without status asthmaticus 10/05/2017  . PONV (postoperative nausea and vomiting) 10/05/2017  . Genetic testing 08/28/2017  . Need for 23-polyvalent pneumococcal polysaccharide vaccine 08/02/2017  . Family hx-breast malignancy 08/02/2017  . Full code status 08/02/2017  . Dense breast tissue on mammogram 07/28/2016  . Screening mammogram, encounter for 07/28/2016  . Left leg pain 12/19/2015  . Left sided sciatica 12/19/2015  . Atelectasis of both lungs 12/16/2015  . Left lumbar radiculopathy 12/16/2015  . Hypomagnesemia 12/06/2015  . Medication monitoring encounter 12/02/2015  . Hypokalemia 12/02/2015  . Preventative health care 07/28/2015  . Osteoporosis   . Fibromyalgia   . MS (multiple sclerosis) (HCC)   . Chronic constipation   . OA (osteoarthritis)   . Hypothyroidism   . IFG (impaired fasting glucose)   . Hypertension   . Hyperlipidemia   . GERD (gastroesophageal reflux disease)   .  Depression   . Anxiety   . TMJ syndrome   . Age-related osteoporosis without current pathological fracture 12/07/2014    Past Medical History:  Diagnosis Date  . Allergy   . Anxiety   . Asthma    as a teenager  . Chronic constipation   . Complication of anesthesia   . Depression   . Dysrhythmia   . Fibromyalgia    Bells Palsy  . Full code status 08/02/2017   Patient wishes to have resuscitative efforts made if collapses; but does not want to remain on life support or if condition is terminal -- see copy of living will  . Genetic testing 08/28/2017   Multi-Cancer panel (83 genes) @ [facility] - No pathogenic mutations detected  . GERD (gastroesophageal reflux disease)   . Heart murmur   . Hyperlipidemia   . Hypertension   . Hypothyroidism   . IFG (impaired fasting glucose)   . MS (multiple sclerosis) (HCC)    hx of long-term corticosteroid use  . OA (osteoarthritis)    osteoporosis  . Osteoporosis 05/2014   managed by Rheumatologist  . Pneumonia 2017  . PONV (postoperative nausea and vomiting)   . TMJ syndrome     Past Surgical History:  Procedure Laterality Date  . ABDOMINAL HYSTERECTOMY     complete at age 68; HRT for ~25y  . BACK SURGERY    . CATARACT EXTRACTION W/PHACO Left 09/27/2017   Procedure: CATARACT EXTRACTION PHACO AND INTRAOCULAR LENS PLACEMENT (IOC);  Surgeon: Willey BladeKing, Bradley  Loraine Leriche, MD;  Location: [facility] ORS;  Service: Ophthalmology;  Laterality: Left;  Lot #[id number] H [place]:   00:27.2 AP%:  13.7 CDE:  3.75  . CATARACT EXTRACTION W/PHACO Right 10/25/2017   Procedure: CATARACT EXTRACTION PHACO AND INTRAOCULAR LENS PLACEMENT (IOC);  Surgeon: Nevada Crane, MD;  Location: [facility];  Service: Ophthalmology;  Laterality: Right;  Lot # [id number] H [place]:   00:23.9 AP%   :8.7 CDE:   2.10  . Cataracts Bilateral    09/27/17 and 10/25/17  . COLONOSCOPY WITH ESOPHAGOGASTRODUODENOSCOPY (EGD)  12/2017   [facility]  . JOINT REPLACEMENT    . KNEE ARTHROSCOPY Right     x 2  . lumbarectomy with fusion  [id number]  . ROTATOR CUFF REPAIR    . tear duct      surgery  . TMJ ARTHROPLASTY    . TOTAL KNEE ARTHROPLASTY Bilateral 09/2007    Social History   Tobacco Use  . Smoking status: Never Smoker  . Smokeless tobacco: Never Used  Substance Use Topics  . Alcohol use: No    Alcohol/week: 0.0 standard drinks     Current Outpatient Medications:  .  calcium carbonate (CALCIUM 600) 600 MG TABS tablet, Take 600 mg by mouth daily., Disp: , Rfl:  .  cyclobenzaprine (FLEXERIL) 10 MG tablet, Take 1 tablet (10 mg total) by mouth 3 (three) times daily as needed for muscle spasms., Disp: 30 tablet, Rfl: 2 .  EPINEPHrine 0.3 mg/0.3 mL IJ SOAJ injection, Inject 0.3 mg into the muscle once. , Disp: , Rfl:  .  ezetimibe-simvastatin (VYTORIN) 10-40 MG tablet, Take 1 tablet by mouth at bedtime., Disp: 90 tablet, Rfl: 1 .  fexofenadine (ALLEGRA) 180 MG tablet, Take 180 mg by mouth daily as needed for allergies or rhinitis., Disp: , Rfl:  .  levothyroxine (SYNTHROID) 100 MCG tablet, Take 1 tablet (100 mcg total) by mouth daily before breakfast., Disp: 90 tablet, Rfl: 3 .  metFORMIN (GLUCOPHAGE-XR) 500 MG 24 hr tablet, Take 1 tablet (500 mg total) by mouth daily with breakfast. Take 1 tab a day for one week then take 2 tabs daily after that., Disp: 60 tablet, Rfl: 2 .  metoprolol succinate (TOPROL-XL) 25 MG 24 hr tablet, Take 1 tablet (25 mg total) by mouth daily. (Patient taking differently: Take 25 mg by mouth at bedtime. ), Disp: 30 tablet, Rfl: 11 .  naproxen sodium (ALEVE) 220 MG tablet, Take 220-440 mg by mouth See admin instructions. Take 220 mg by mouth in the morning and take 440 mg by mouth at bedtime, Disp: , Rfl:  .  omeprazole (PRILOSEC) 20 MG capsule, Take by mouth., Disp: , Rfl:  .  sertraline (ZOLOFT) 100 MG tablet, Take 1 tablet (100 mg total) by mouth daily., Disp: 90 tablet, Rfl: 1 .  spironolactone (ALDACTONE) 25 MG tablet, Take 25 mg by mouth daily. With  breakfast, Disp: , Rfl:  .  Vitamin D, Cholecalciferol, 50 MCG (2000 UT) CAPS, Take by mouth., Disp: , Rfl:   Allergies  Allergen Reactions  . Shellfish Allergy Rash and Swelling  . Codeine Nausea And Vomiting  . Sulfa Antibiotics Swelling    ROS   No other specific complaints in a complete review of systems (except as listed in HPI above).  Objective  Vitals:   08/09/18 0942  BP: 132/78  Pulse: 93  Resp: 16  Temp: 98.1 F (36.7 C)  TempSrc: Oral  SpO2: 99%  Weight: 203 lb 3.2 oz (92.2 kg)  Height: 5'  4" (1.626 m)    Body mass index is 34.88 kg/m.  Nursing Note and Vital Signs reviewed.  Physical Exam Constitutional:      Appearance: Normal appearance. She is well-developed.  HENT:     Head: Normocephalic and atraumatic.     Right Ear: Hearing and ear canal normal. No swelling or tenderness. A middle ear effusion is present. Tympanic membrane is not erythematous.     Left Ear: Hearing and ear canal normal. No swelling or tenderness. A middle ear effusion is present. Tympanic membrane is not erythematous.     Nose: Congestion present.     Right Sinus: Maxillary sinus tenderness (increased tenderness) and frontal sinus tenderness (mild) present.     Left Sinus: Maxillary sinus tenderness (mild) and frontal sinus tenderness (mild) present.     Mouth/Throat:     Mouth: Mucous membranes are dry.     Pharynx: Oropharynx is clear.  Eyes:     Conjunctiva/sclera: Conjunctivae normal.  Cardiovascular:     Rate and Rhythm: Normal rate and regular rhythm.     Heart sounds: Normal heart sounds.  Pulmonary:     Effort: Pulmonary effort is normal.     Breath sounds: Normal breath sounds.  Musculoskeletal: Normal range of motion.  Neurological:     Mental Status: She is alert and oriented to person, place, and time.  Psychiatric:        Speech: Speech normal.        Behavior: Behavior normal. Behavior is cooperative.        Thought Content: Thought content normal.         Judgment: Judgment normal.       No results found for this or any previous visit (from the past 48 hour(s)).  Assessment & Plan  1. Cough - doxycycline (VIBRA-TABS) 100 MG tablet; Take 1 tablet (100 mg total) by mouth 2 (two) times daily.  Dispense: 20 tablet; Refill: 0 - benzonatate (TESSALON) 200 MG capsule; Take 1 capsule (200 mg total) by mouth 2 (two) times daily as needed for cough.  Dispense: 20 capsule; Refill: 0  2. Acute recurrent maxillary sinusitis Illness ongoing for 13 days with no improvement and worsening of symptoms. - Please start taking antibiotic (doxycycline) twice a day with food. I do recommend taking a probiotic as well to replenish good gut health. Complete the entire course even if your symptoms are improving before it is completed to prevent antibiotic resistance - Continue corcidin HBP, can additionally take allegra to dry out sinuses, and musinex to help expectorate - If cough is not controlled with corcidin you can additionally take the perls sent to the pharmacy or stop corcidin and switch.  - Rest, drink plenty of water ( at least 64 ounces), good nutrition with plenty of vitamin C - doxycycline (VIBRA-TABS) 100 MG tablet; Take 1 tablet (100 mg total) by mouth 2 (two) times daily.  Dispense: 20 tablet; Refill: 0

## 2018-09-09 ENCOUNTER — Other Ambulatory Visit: Payer: Self-pay | Admitting: Family Medicine

## 2018-09-09 DIAGNOSIS — M549 Dorsalgia, unspecified: Secondary | ICD-10-CM

## 2018-09-12 ENCOUNTER — Other Ambulatory Visit: Payer: Self-pay | Admitting: Family Medicine

## 2018-09-12 MED ORDER — CYCLOBENZAPRINE HCL 5 MG PO TABS: 5.0000 mg | ORAL_TABLET | Freq: Three times a day (TID) | ORAL | 0 refills | Status: DC | PRN

## 2018-09-12 NOTE — Telephone Encounter (Signed)
Pt checking status on med refill. Did inform that the note stated requested to soon. But pt is out and did not have any refills on medication. Please verify.

## 2018-09-12 NOTE — Addendum Note (Signed)
Addended by: Kalyna Paolella, Janit Bern on: 09/12/2018 04:12 PM   Modules accepted: Orders

## 2018-09-12 NOTE — Telephone Encounter (Signed)
I'm going to decrease her dose from 10 mg to 5 mg and will ask her to start working with physical therapy for whatever area is bothering her to warrant long-term use of a muscle relaxer; please REFER to PT using her specific complaint (back pain or shoulder pain or whatever) She has been on this medicine for a long time If it's for MS-related disease, please ask her to contact her neurologist for future refills after this one

## 2018-09-12 NOTE — Addendum Note (Signed)
Addended by: Davene Costain on: 09/12/2018 03:10 PM   Modules accepted: Orders

## 2018-09-13 NOTE — Addendum Note (Signed)
Addended by: Davene Costain on: 09/13/2018 09:08 AM   Modules accepted: Orders

## 2018-09-13 NOTE — Telephone Encounter (Signed)
Patient notified, referral enetered

## 2018-09-27 ENCOUNTER — Ambulatory Visit: Payer: Medicare Other | Admitting: Family Medicine

## 2018-09-27 ENCOUNTER — Encounter: Payer: Self-pay | Admitting: Family Medicine

## 2018-09-27 VITALS — BP 124/76 | HR 75 | Temp 98.1°F | Resp 12 | Ht 64.0 in | Wt 199.3 lb

## 2018-09-27 DIAGNOSIS — G35 Multiple sclerosis: Secondary | ICD-10-CM

## 2018-09-27 DIAGNOSIS — I10 Essential (primary) hypertension: Secondary | ICD-10-CM

## 2018-09-27 DIAGNOSIS — K5909 Other constipation: Secondary | ICD-10-CM

## 2018-09-27 DIAGNOSIS — E66811 Obesity, class 1: Secondary | ICD-10-CM

## 2018-09-27 DIAGNOSIS — M810 Age-related osteoporosis without current pathological fracture: Secondary | ICD-10-CM

## 2018-09-27 DIAGNOSIS — R7301 Impaired fasting glucose: Secondary | ICD-10-CM | POA: Diagnosis not present

## 2018-09-27 DIAGNOSIS — E039 Hypothyroidism, unspecified: Secondary | ICD-10-CM

## 2018-09-27 DIAGNOSIS — E669 Obesity, unspecified: Secondary | ICD-10-CM | POA: Insufficient documentation

## 2018-09-27 MED ORDER — TIZANIDINE HCL 2 MG PO TABS: 2.0000 mg | ORAL_TABLET | Freq: Every day | ORAL | 0 refills | Status: DC

## 2018-09-27 NOTE — Progress Notes (Signed)
BP 124/76   Pulse 75   Temp 98.1 F (36.7 C) (Oral)   Resp 12   Ht 5\' 4"  (1.626 m)   Wt 199 lb 4.8 oz (90.4 kg)   SpO2 97%   BMI 34.21 kg/m    Subjective:    Patient ID: Brittany Hill, female    DOB: June 18, 1951, 68 y.o.   MRN: [id number]  HPI: Brittany Hill is a 68 y.o. female  Chief Complaint  Patient presents with  . Follow-up    HPI Patient is here for follow-up  She has prediabetes; because of MS, cannot feel the feet; takes good care of her feet, regular pedicures; trying to avoid sugary drinks; whole wheat bread; not much white rice  Lab Results  Component Value Date   HGBA1C 6.2 (H) 03/28/2018   High cholesterol, with low HDL and high TG; not doing sugary drinks; does like candy; not much fried food; this runs in the family  Lab Results  Component Value Date   CHOL 149 03/28/2018   HDL 48 (L) 03/28/2018   LDLCALC 63 03/28/2018   TRIG 292 (H) 03/28/2018   CHOLHDL 3.1 03/28/2018   Obesity; lost weight since the last visit; her ideal weight would be 165 pounds; stress eating, sweets; trying to get enough steps; 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 Lady Gary and was having pressure on the chest; it was anxiety; stress with family; hypertension is controlled with meds  Hypothyroidism; some weight change; chronic constipation  Lab Results  Component Value Date   TSH 1.11 03/28/2018   Osteoporosis, Dr. Lavenia Atlas managing this; doing Reclast;  getting calcium and vit D  Depression screen [facility] 2/9 09/27/2018 08/06/2018 03/28/2018 08/02/2017 10/27/2016  Decreased Interest 0 0 1 1 0  Down, Depressed, Hopeless 0 0 1 3 0  PHQ - 2 Score 0 0 2 4 0  Altered sleeping 0 1 2 2  -  Tired, decreased energy 0 3 2 3  -  Change in appetite 0 1 2 2  -  Feeling bad or failure about yourself  0 0 0 1 -  Trouble concentrating 0 0 1 2 -  Moving slowly or fidgety/restless 0 0 1 0 -  Suicidal thoughts 0 0 0 0 -  PHQ-9 Score 0 5 10 14  -  Difficult doing work/chores Not difficult at all Not difficult at all Somewhat difficult Somewhat difficult -   Fall Risk  09/27/2018 08/09/2018 08/06/2018 03/28/2018 08/02/2017  Falls in the past year? 0 0 0 No No  Number falls in past yr: 0 0 0 - -  Injury with Fall? 0 0 - - -    Relevant past medical, surgical, family and social history reviewed Past Medical History:  Diagnosis Date  . Allergy   . Anxiety   . Asthma    as a teenager  . Chronic constipation   . Complication of anesthesia   . Depression   .  Dysrhythmia   . Fibromyalgia    Bells Palsy  . Full code status 08/02/2017   Patient wishes to have resuscitative efforts made if collapses; but does not want to remain on life support or if condition is terminal -- see copy of living will  . Genetic testing 08/28/2017   Multi-Cancer panel (83 genes) @ [facility] - No pathogenic mutations detected  . GERD (gastroesophageal reflux disease)   . Heart murmur   . Hyperlipidemia   . Hypertension   . Hypothyroidism   . IFG (impaired fasting glucose)   . MS (multiple sclerosis) (HCC)    hx of long-term corticosteroid use  . OA (osteoarthritis)    osteoporosis  . Osteoporosis 05/2014   managed by Rheumatologist  . Pneumonia 2017  . PONV (postoperative nausea and vomiting)   . TMJ syndrome    Past Surgical History:  Procedure Laterality Date  . ABDOMINAL HYSTERECTOMY     complete at age 45; HRT for ~25y  . BACK SURGERY    . CATARACT EXTRACTION W/PHACO  Left 09/27/2017   Procedure: CATARACT EXTRACTION PHACO AND INTRAOCULAR LENS PLACEMENT (IOC);  Surgeon: Nevada Crane, MD;  Location: [facility] ORS;  Service: Ophthalmology;  Laterality: Left;  Lot #[id number] H [place]:   00:27.2 AP%:  13.7 CDE:  3.75  . CATARACT EXTRACTION W/PHACO Right 10/25/2017   Procedure: CATARACT EXTRACTION PHACO AND INTRAOCULAR LENS PLACEMENT (IOC);  Surgeon: Nevada Crane, MD;  Location: [facility] ORS;  Service: Ophthalmology;  Laterality: Right;  Lot # [id number] H [place]:   00:23.9 AP%   :8.7 CDE:   2.10  . Cataracts Bilateral    09/27/17 and 10/25/17  . COLONOSCOPY WITH ESOPHAGOGASTRODUODENOSCOPY ([facility])  [facility]   . JOINT REPLACEMENT    . KNEE ARTHROSCOPY Right    x 2  . lumbarectomy with fusion  [id number]  . ROTATOR CUFF REPAIR    . tear duct      surgery  . TMJ ARTHROPLASTY    . TOTAL KNEE ARTHROPLASTY Bilateral 09/2007   Family History  Problem Relation Age of Onset  . Heart disease Mother   . Hypertension Mother   . Lymphoma Mother 3       deceased 70  . Stroke Father   . Heart attack Father   . Heart disease Father   . Hypertension Father   . Alcohol abuse Father   . Hypertension Sister   . Colon cancer Sister 8  . Hypertension Brother   . Diabetes Brother   . Mental illness Brother   . Depression Brother   . Lung cancer Brother 77       smoker; currently 36  . Alcohol abuse Son   . Breast cancer Maternal Aunt 75       currently 83  . Breast cancer Maternal Aunt        dx 24s; mets in 71s  . AAA (abdominal aortic aneurysm) Maternal Grandmother   . Heart disease Maternal Grandmother   . Heart attack Paternal Grandfather   . Throat cancer Paternal Uncle        smoker   Social History   Tobacco Use  . Smoking status: Never Smoker  . Smokeless tobacco: Never Used  Substance Use Topics  . Alcohol use: No    Alcohol/week: 0.0 standard drinks  . Drug use: No     [facility] Visit from 09/27/2018 in [facility]  AUDIT-C Score  0      Interim medical  history since last visit reviewed. Allergies and medications reviewed  Review of Systems Per HPI unless specifically indicated above     Objective:    BP 124/76   Pulse 75   Temp 98.1 F (36.7 C) (Oral)   Resp 12   Ht 5\' 4"  (1.626 m)   Wt 199 lb 4.8 oz (90.4 kg)   SpO2 97%   BMI 34.21 kg/m   Wt Readings from Last 3 Encounters:  09/27/18 199 lb 4.8 oz (90.4 kg)  08/09/18 203 lb 3.2 oz (92.2 kg)  08/06/18 199 lb 12.8 oz (90.6 kg)    Physical Exam Constitutional:      General: She is not in acute distress.    Appearance: She is well-developed. She is obese. She is not diaphoretic.  HENT:     Head: Normocephalic and atraumatic.  Eyes:     General: No scleral icterus. Neck:     Thyroid: No thyromegaly.  Cardiovascular:     Rate and Rhythm: Normal rate and regular rhythm.     Heart sounds: Normal heart sounds. No murmur.  Pulmonary:     Effort: Pulmonary effort is normal. No respiratory distress.     Breath sounds: Normal breath sounds. No wheezing.  Abdominal:     General: Bowel sounds are normal. There is no distension.     Palpations: Abdomen is soft.  Skin:    General: Skin is warm and dry.     Coloration: Skin is not pale.  Neurological:     Mental Status: She is alert.  Psychiatric:        Behavior: Behavior normal.        Thought Content: Thought content normal.        Judgment: Judgment normal.    Diabetic Foot Form - Detailed   Diabetic Foot Exam - detailed Diabetic Foot exam was performed with the following findings:  Yes 09/27/2018 11:20 AM  Visual Foot Exam completed.:  Yes  Pulse Foot Exam completed.:  Yes  Right Dorsalis Pedis:  Present Left Dorsalis Pedis:  Present  Sensory Foot Exam Completed.:  Yes Semmes-Weinstein Monofilament Test R Site 1-Great Toe:  Pos L Site 1-Great Toe:  Pos        Results for orders placed or performed in visit on 04/02/18  HM DIABETES EYE EXAM  Result Value Ref  Range   HM Diabetic Eye Exam No Retinopathy No Retinopathy  she confirms she had this exam     Assessment & Plan:   Problem List Items Addressed This Visit      Cardiovascular and Mediastinum   Hypertension (Chronic)    Seeing cardiologist; now controlled with meds; try to limit excess salt and follow the DASH guidelines      Relevant Medications   metoprolol succinate (TOPROL-XL) 50 MG 24 hr tablet     Digestive   Chronic constipation (Chronic)    Likely MS, but will check TSH and adjust med if needed        Endocrine   IFG (impaired fasting glucose) (Chronic)    Check A1c next week along with fasting glucose; limit sweets when possible, walk and working on weight loss      Hypothyroidism (Chronic)    Check TSH next week      Relevant Medications   metoprolol succinate (TOPROL-XL) 50 MG 24 hr tablet     Nervous and Auditory   MS (multiple sclerosis) (HCC) (Chronic)    She does not want to take medicines, does  not want to see neurologist; we discussed risk of flexeril and she agrees to try different medicine; call with any problems        Musculoskeletal and Integument   Osteoporosis - Primary (Chronic)    Managed by rheumatologist; Reclast maintaining bone; practice fall precautions; calcium and vit D      Relevant Orders   DG Bone Density     Other   Obesity (BMI 30.0-34.9)    Praise given; keep working on this; offered nutritionist referral; she knows what to do          Follow up plan: Return in about 6 months (around 04/07/2019) for follow-up visit with Dr. Sherie DonLada and labs.  An after-visit summary was printed and given to the patient at check-out.  Please see the patient instructions which may contain other information and recommendations beyond what is mentioned above in the assessment and plan.  Meds ordered this encounter  Medications  . tiZANidine (ZANAFLEX) 2 MG tablet    Sig: Take 1 tablet (2 mg total) by mouth at bedtime. Instead of  cyclobenzaprine    Dispense:  30 tablet    Refill:  0    Orders Placed This Encounter  Procedures  . DG Bone Density

## 2018-09-27 NOTE — Assessment & Plan Note (Signed)
Managed by rheumatologist; Reclast maintaining bone; practice fall precautions; calcium and vit D

## 2018-09-27 NOTE — Assessment & Plan Note (Signed)
Check TSH next week

## 2018-09-27 NOTE — Assessment & Plan Note (Signed)
Check A1c next week along with fasting glucose; limit sweets when possible, walk and working on weight loss

## 2018-09-27 NOTE — Patient Instructions (Addendum)
Please do call to schedule your mammogram; the number to schedule one at either [facility] or [facility] is [phone]  Check out the information at [email or web address] entitled "Nutrition for Weight Loss: What You Need to Know about Fad Diets" Try to lose between 1-2 pounds per week by taking in fewer calories and burning off more calories You can succeed by limiting portions, limiting foods dense in calories and fat, becoming more active, and drinking 8 glasses of water a day (64 ounces) Don't skip meals, especially breakfast, as skipping meals may alter your metabolism Do not use over-the-counter weight loss pills or gimmicks that claim rapid weight loss A healthy BMI (or body mass index) is between 18.5 and 24.9 You can calculate your ideal BMI at the NIH website JobEconomics.hu  Return next week for labs Stop the cyclobenzaprine (Flexeril) Start the new medicine instead and let me know if not effective   Obesity, Adult Obesity is the condition of having too much total body fat. Being overweight or obese means that your weight is greater than what is considered healthy for your body size. Obesity is determined by a measurement called BMI. BMI is an estimate of body fat and is calculated from height and weight. For adults, a BMI of 30 or higher is considered obese. Obesity can eventually lead to other health concerns and major illnesses, including:  Stroke.  Coronary artery disease (CAD).  Type 2 diabetes.  Some types of cancer, including cancers of the colon, breast, uterus, and gallbladder.  Osteoarthritis.  High blood pressure (hypertension).  High cholesterol.  Sleep apnea.  Gallbladder stones.  Infertility problems. What are the causes? The main cause of obesity is taking in (consuming) more calories than your body uses for energy. Other factors that contribute to this condition may  include:  Being born with genes that make you more likely to become obese.  Having a medical condition that causes obesity. These conditions include: ? Hypothyroidism. ? Polycystic ovarian syndrome (PCOS). ? Binge-eating disorder. ? Cushing syndrome.  Taking certain medicines, such as steroids, antidepressants, and seizure medicines.  Not being physically active (sedentary lifestyle).  Living where there are limited places to exercise safely or buy healthy foods.  Not getting enough sleep. What increases the risk? The following factors may increase your risk of this condition:  Having a family history of obesity.  Being a woman of African-American descent.  Being a man of Hispanic descent. What are the signs or symptoms? Having excessive body fat is the main symptom of this condition. How is this diagnosed? This condition may be diagnosed based on:  Your symptoms.  Your medical history.  A physical exam. Your health care provider may measure: ? Your BMI. If you are an adult with a BMI between 25 and less than 30, you are considered overweight. If you are an adult with a BMI of 30 or higher, you are considered obese. ? The distances around your hips and your waist (circumferences). These may be compared to each other to help diagnose your condition. ? Your skinfold thickness. Your health care provider may gently pinch a fold of your skin and measure it. How is this treated? Treatment for this condition often includes changing your lifestyle. Treatment may include some or all of the following:  Dietary changes. Work with your health care provider and a dietitian to set a weight-loss goal that is healthy and reasonable for you. Dietary changes may include eating: ? Smaller portions. A  portion size is the amount of a particular food that is healthy for you to eat at one time. This varies from person to person. ? Low-calorie or low-fat options. ? More whole grains, fruits, and  vegetables.  Regular physical activity. This may include aerobic activity (cardio) and strength training.  Medicine to help you lose weight. Your health care provider may prescribe medicine if you are unable to lose 1 pound a week after 6 weeks of eating more healthily and doing more physical activity.  Surgery. Surgical options may include gastric banding and gastric bypass. Surgery may be done if: ? Other treatments have not helped to improve your condition. ? You have a BMI of 40 or higher. ? You have life-threatening health problems related to obesity. Follow these instructions at home:  Eating and drinking   Follow recommendations from your health care provider about what you eat and drink. Your health care provider may advise you to: ? Limit fast foods, sweets, and processed snack foods. ? Choose low-fat options, such as low-fat milk instead of whole milk. ? Eat 5 or more servings of fruits or vegetables every day. ? Eat at home more often. This gives you more control over what you eat. ? Choose healthy foods when you eat out. ? Learn what a healthy portion size is. ? Keep low-fat snacks on hand. ? Avoid sugary drinks, such as soda, fruit juice, iced tea sweetened with sugar, and flavored milk. ? Eat a healthy breakfast.  Drink enough water to keep your urine clear or pale yellow.  Do not go without eating for long periods of time (do not fast) or follow a fad diet. Fasting and fad diets can be unhealthy and even dangerous. Physical Activity  Exercise regularly, as told by your health care provider. Ask your health care provider what types of exercise are safe for you and how often you should exercise.  Warm up and stretch before being active.  Cool down and stretch after being active.  Rest between periods of activity. Lifestyle  Limit the time that you spend in front of your TV, computer, or video game system.  Find ways to reward yourself that do not involve  food.  Limit alcohol intake to no more than 1 drink a day for nonpregnant women and 2 drinks a day for men. One drink equals 12 oz of beer, 5 oz of wine, or 1 oz of hard liquor. General instructions  Keep a weight loss journal to keep track of the food you eat and how much you exercise you get.  Take over-the-counter and prescription medicines only as told by your health care provider.  Take vitamins and supplements only as told by your health care provider.  Consider joining a support group. Your health care provider may be able to recommend a support group.  Keep all follow-up visits as told by your health care provider. This is important. Contact a health care provider if:  You are unable to meet your weight loss goal after 6 weeks of dietary and lifestyle changes. This information is not intended to replace advice given to you by your health care provider. Make sure you discuss any questions you have with your health care provider. Document Released: 08/31/2004 Document Revised: 12/27/2015 Document Reviewed: 05/12/2015 Elsevier Interactive Patient Education  2019 Elsevier Inc.  Preventing Unhealthy Kinder Morgan EnergyWeight Gain, Adult Staying at a healthy weight is important to your overall health. When fat builds up in your body, you may become overweight or  obese. Being overweight or obese increases your risk of developing certain health problems, such as heart disease, diabetes, sleeping problems, joint problems, and some types of cancer. Unhealthy weight gain is often the result of making unhealthy food choices or not getting enough exercise. You can make changes to your lifestyle to prevent obesity and stay as healthy as possible. What nutrition changes can be made?   Eat only as much as your body needs. To do this: ? Pay attention to signs that you are hungry or full. Stop eating as soon as you feel full. ? If you feel hungry, try drinking water first before eating. Drink enough water so your  urine is clear or pale yellow. ? Eat smaller portions. Pay attention to portion sizes when eating out. ? Look at serving sizes on food labels. Most foods contain more than one serving per container. ? Eat the recommended number of calories for your gender and activity level. For most active people, a daily total of 2,000 calories is appropriate. If you are trying to lose weight or are not very active, you may need to eat fewer calories. Talk with your health care provider or a diet and nutrition specialist (dietitian) about how many calories you need each day.  Choose healthy foods, such as: ? Fruits and vegetables. At each meal, try to fill at least half of your plate with fruits and vegetables. ? Whole grains, such as whole-wheat bread, brown rice, and quinoa. ? Lean meats, such as chicken or fish. ? Other healthy proteins, such as beans, eggs, or tofu. ? Healthy fats, such as nuts, seeds, fatty fish, and olive oil. ? Low-fat or fat-free dairy products.  Check food labels, and avoid food and drinks that: ? Are high in calories. ? Have added sugar. ? Are high in sodium. ? Have saturated fats or trans fats.  Cook foods in healthier ways, such as by baking, broiling, or grilling.  Make a meal plan for the week, and shop with a grocery list to help you stay on track with your purchases. Try to avoid going to the grocery store when you are hungry.  When grocery shopping, try to shop around the outside of the store first, where the fresh foods are. Doing this helps you to avoid prepackaged foods, which can be high in sugar, salt (sodium), and fat. What lifestyle changes can be made?   Exercise for 30 or more minutes on 5 or more days each week. Exercising may include brisk walking, yard work, biking, running, swimming, and team sports like basketball and soccer. Ask your health care provider which exercises are safe for you.  Do muscle-strengthening activities, such as lifting weights or  using resistance bands, on 2 or more days a week.  Do not use any products that contain nicotine or tobacco, such as cigarettes and e-cigarettes. If you need help quitting, ask your health care provider.  Limit alcohol intake to no more than 1 drink a day for nonpregnant women and 2 drinks a day for men. One drink equals 12 oz of beer, 5 oz of wine, or 1 oz of hard liquor.  Try to get 7-9 hours of sleep each night. What other changes can be made?  Keep a food and activity journal to keep track of: ? What you ate and how many calories you had. Remember to count the calories in sauces, dressings, and side dishes. ? Whether you were active, and what exercises you did. ? Your calorie, weight,  and activity goals.  Check your weight regularly. Track any changes. If you notice you have gained weight, make changes to your diet or activity routine.  Avoid taking weight-loss medicines or supplements. Talk to your health care provider before starting any new medicine or supplement.  Talk to your health care provider before trying any new diet or exercise plan. Why are these changes important? Eating healthy, staying active, and having healthy habits can help you to prevent obesity. Those changes also:  Help you manage stress and emotions.  Help you connect with friends and family.  Improve your self-esteem.  Improve your sleep.  Prevent long-term health problems. What can happen if changes are not made? Being obese or overweight can cause you to develop joint or bone problems, which can make it hard for you to stay active or do activities you enjoy. Being obese or overweight also puts stress on your heart and lungs and can lead to health problems like diabetes, heart disease, and some cancers. Where to find more information Talk with your health care provider or a dietitian about healthy eating and healthy lifestyle choices. You may also find information from:  U.[place]. Department of  Agriculture, MyPlate: https://ball-collins.biz/  American Heart Association: www.heart.org  Centers for Disease Control and Prevention: FootballExhibition.com.br Summary  Staying at a healthy weight is important to your overall health. It helps you to prevent certain diseases and health problems, such as heart disease, diabetes, joint problems, sleep disorders, and some types of cancer.  Being obese or overweight can cause you to develop joint or bone problems, which can make it hard for you to stay active or do activities you enjoy.  You can prevent unhealthy weight gain by eating a healthy diet, exercising regularly, not smoking, limiting alcohol, and getting enough sleep.  Talk with your health care provider or a dietitian for guidance about healthy eating and healthy lifestyle choices. This information is not intended to replace advice given to you by your health care provider. Make sure you discuss any questions you have with your health care provider. Document Released: 07/25/2016 Document Revised: 05/04/2017 Document Reviewed: 08/30/2016 Elsevier Interactive Patient Education  2019 [employer].

## 2018-09-27 NOTE — Assessment & Plan Note (Signed)
Seeing cardiologist; now controlled with meds; try to limit excess salt and follow the DASH guidelines

## 2018-09-27 NOTE — Assessment & Plan Note (Signed)
Likely MS, but will check TSH and adjust med if needed

## 2018-09-27 NOTE — Assessment & Plan Note (Signed)
Praise given; keep working on this; offered nutritionist referral; she knows what to do

## 2018-10-01 ENCOUNTER — Ambulatory Visit: Payer: Medicare Other | Admitting: Physical Therapy

## 2018-10-01 ENCOUNTER — Other Ambulatory Visit: Payer: Self-pay | Admitting: Family Medicine

## 2018-10-01 DIAGNOSIS — Z1231 Encounter for screening mammogram for malignant neoplasm of breast: Secondary | ICD-10-CM

## 2018-10-02 NOTE — Assessment & Plan Note (Signed)
She does not want to take medicines, does not want to see neurologist; we discussed risk of flexeril and she agrees to try different medicine; call with any problems

## 2018-10-11 ENCOUNTER — Other Ambulatory Visit: Payer: Self-pay

## 2018-10-11 ENCOUNTER — Other Ambulatory Visit: Payer: Self-pay | Admitting: Family Medicine

## 2018-10-11 DIAGNOSIS — E782 Mixed hyperlipidemia: Secondary | ICD-10-CM

## 2018-10-11 DIAGNOSIS — E66811 Obesity, class 1: Secondary | ICD-10-CM

## 2018-10-11 DIAGNOSIS — E876 Hypokalemia: Secondary | ICD-10-CM

## 2018-10-11 DIAGNOSIS — I10 Essential (primary) hypertension: Secondary | ICD-10-CM

## 2018-10-11 DIAGNOSIS — M810 Age-related osteoporosis without current pathological fracture: Secondary | ICD-10-CM

## 2018-10-11 DIAGNOSIS — Z5181 Encounter for therapeutic drug level monitoring: Secondary | ICD-10-CM

## 2018-10-11 DIAGNOSIS — E669 Obesity, unspecified: Secondary | ICD-10-CM

## 2018-10-11 NOTE — Progress Notes (Signed)
LABS ORDERED.

## 2018-10-12 LAB — TSH: TSH: 1.86 mIU/L (ref 0.40–4.50)

## 2018-10-12 LAB — COMPLETE METABOLIC PANEL WITH GFR
AG Ratio: 1.6 (calc) (ref 1.0–2.5)
ALT: 15 U/L (ref 6–29)
AST: 21 U/L (ref 10–35)
Albumin: 4.5 g/dL (ref 3.6–5.1)
Alkaline phosphatase (APISO): 40 U/L (ref 37–153)
BUN: 12 mg/dL (ref 7–25)
CO2: 25 mmol/L (ref 20–32)
Calcium: 10.1 mg/dL (ref 8.6–10.4)
Chloride: 104 mmol/L (ref 98–110)
Creat: 0.85 mg/dL (ref 0.50–0.99)
GFR, Est African American: 82 mL/min/{1.73_m2} (ref >=60)
GFR, Est Non African American: 71 mL/min/{1.73_m2} (ref >=60)
Globulin: 2.8 g/dL (calc) (ref 1.9–3.7)
Glucose, Bld: 96 mg/dL (ref 65–139)
Potassium: 4.4 mmol/L (ref 3.5–5.3)
Sodium: 141 mmol/L (ref 135–146)
Total Bilirubin: 0.4 mg/dL (ref 0.2–1.2)
Total Protein: 7.3 g/dL (ref 6.1–8.1)

## 2018-10-12 LAB — LIPID PANEL
Cholesterol: 161 mg/dL (ref <200)
HDL: 52 mg/dL (ref >=50)
LDL CHOLESTEROL (CALC): 86 mg/dL
Non-HDL Cholesterol (Calc): 109 mg/dL (calc) (ref <130)
Total CHOL/HDL Ratio: 3.1 (calc) (ref <5.0)
Triglycerides: 130 mg/dL (ref <150)

## 2018-10-12 LAB — HEMOGLOBIN A1C
Hgb A1c MFr Bld: 6.1 % of total Hgb — ABNORMAL HIGH (ref <5.7)
Mean Plasma Glucose: 128 (calc)
eAG (mmol/L): 7.1 (calc)

## 2018-10-12 LAB — MAGNESIUM: Magnesium: 2 mg/dL (ref 1.5–2.5)

## 2018-10-12 LAB — VITAMIN D 25 HYDROXY (VIT D DEFICIENCY, FRACTURES): Vit D, 25-Hydroxy: 38 ng/mL (ref 30–100)

## 2018-10-17 ENCOUNTER — Other Ambulatory Visit: Payer: Self-pay

## 2018-10-17 ENCOUNTER — Ambulatory Visit
Admission: RE | Admit: 2018-10-17 | Discharge: 2018-10-17 | Disposition: A | Discharge status: Home / Self Care | Payer: Medicare Other | Source: Ambulatory Visit | Attending: Family Medicine | Admitting: Family Medicine

## 2018-10-17 DIAGNOSIS — M810 Age-related osteoporosis without current pathological fracture: Secondary | ICD-10-CM

## 2018-10-17 DIAGNOSIS — Z1231 Encounter for screening mammogram for malignant neoplasm of breast: Secondary | ICD-10-CM | POA: Diagnosis present

## 2018-10-18 ENCOUNTER — Ambulatory Visit: Payer: Medicare Other | Admitting: Family Medicine

## 2018-10-18 ENCOUNTER — Encounter: Payer: Self-pay | Admitting: Family Medicine

## 2018-10-18 VITALS — BP 126/78 | HR 85 | Temp 97.8°F | Resp 14 | Ht 64.0 in | Wt 195.5 lb

## 2018-10-18 DIAGNOSIS — J0100 Acute maxillary sinusitis, unspecified: Secondary | ICD-10-CM

## 2018-10-18 DIAGNOSIS — R109 Unspecified abdominal pain: Secondary | ICD-10-CM

## 2018-10-18 DIAGNOSIS — J40 Bronchitis, not specified as acute or chronic: Secondary | ICD-10-CM

## 2018-10-18 LAB — POCT URINALYSIS DIPSTICK
Bilirubin, UA: NEGATIVE
Blood, UA: NEGATIVE
GLUCOSE UA: NEGATIVE
Ketones, UA: NEGATIVE
Leukocytes, UA: NEGATIVE
Nitrite, UA: NEGATIVE
Protein, UA: NEGATIVE
Spec Grav, UA: 1.01 (ref 1.010–1.025)
Urobilinogen, UA: NEGATIVE E.U./dL — AB
pH, UA: 6.5 (ref 5.0–8.0)

## 2018-10-18 MED ORDER — PREDNISONE 10 MG PO TABS: ORAL_TABLET | ORAL | 0 refills | Status: AC

## 2018-10-18 MED ORDER — DOXYCYCLINE HYCLATE 100 MG PO TABS: 100.0000 mg | ORAL_TABLET | Freq: Two times a day (BID) | ORAL | 0 refills | Status: AC

## 2018-10-18 MED ORDER — ALBUTEROL SULFATE HFA 108 (90 BASE) MCG/ACT IN AERS: 2.0000 | INHALATION_SPRAY | Freq: Four times a day (QID) | RESPIRATORY_TRACT | 1 refills | Status: DC | PRN

## 2018-10-18 MED ORDER — PROMETHAZINE-DM 6.25-15 MG/5ML PO SYRP: 5.0000 mL | ORAL_SOLUTION | Freq: Four times a day (QID) | ORAL | 0 refills | Status: DC | PRN

## 2018-10-18 NOTE — Progress Notes (Signed)
Name: Brittany Hill   MRN: [id number]    DOB: 1951-03-30   Date:10/20/2018       Progress Note  Subjective  Chief Complaint  Chief Complaint  Patient presents with  . URI    Chest congestion, cough, onset 3 days ago.     HPI  PT presents with concern for chest congestion, productive cough for about 3 days.  She has been dealing with "allergies" and nasal congestion for about 3-4 weeks, took allegra, then flonase which seemed to be helping, but then stopped working 3 days ago.  She also notes chest tightness and some wheezing.  Has history of asthma, but has not had issues for decades. Shortness of breath is at night only.  Has had some nausea with post-nasal drainage.  Denies vomiting.  Did have fever about 6 days ago, but this has since resolved. Having LEFT flank pain but no hematuria, dysuria, or urinary frequency, no history of kidney stones. No recent travel, and no known exposure to COVID-19.  Patient Active Problem List   Diagnosis Date Noted  . Obesity (BMI 30.0-34.9) 09/27/2018  . Cataract, left 04/02/2018  . Asthma without status asthmaticus 10/05/2017  . PONV (postoperative nausea and vomiting) 10/05/2017  . Genetic testing 08/28/2017  . Need for 23-polyvalent pneumococcal polysaccharide vaccine 08/02/2017  . Family hx-breast malignancy 08/02/2017  . Full code status 08/02/2017  . Dense breast tissue on mammogram 07/28/2016  . Screening mammogram, encounter for 07/28/2016  . Left leg pain 12/19/2015  . Left sided sciatica 12/19/2015  . Atelectasis of both lungs 12/16/2015  . Left lumbar radiculopathy 12/16/2015  . Hypomagnesemia 12/06/2015  . Medication monitoring encounter 12/02/2015  . Hypokalemia 12/02/2015  . Preventative health care 07/28/2015  . Osteoporosis   . Fibromyalgia   . MS (multiple sclerosis) (HCC)   . Chronic constipation   . OA (osteoarthritis)   . Hypothyroidism   . IFG (impaired fasting glucose)   . Hypertension   . Hyperlipidemia   .  GERD (gastroesophageal reflux disease)   . Depression   . Anxiety   . TMJ syndrome   . Age-related osteoporosis without current pathological fracture 12/07/2014    Social History   Tobacco Use  . Smoking status: Never Smoker  . Smokeless tobacco: Never Used  Substance Use Topics  . Alcohol use: No    Alcohol/week: 0.0 standard drinks     Current Outpatient Medications:  .  calcium carbonate (CALCIUM 600) 600 MG TABS tablet, Take 600 mg by mouth as needed. , Disp: , Rfl:  .  EPINEPHrine 0.3 mg/0.3 mL IJ SOAJ injection, Inject 0.3 mg into the muscle once. , Disp: , Rfl:  .  ezetimibe-simvastatin (VYTORIN) 10-40 MG tablet, Take 1 tablet by mouth at bedtime., Disp: 90 tablet, Rfl: 1 .  fluticasone (FLONASE) 50 MCG/ACT nasal spray, Place 2 sprays into both nostrils daily., Disp: , Rfl:  .  levothyroxine (SYNTHROID) 100 MCG tablet, Take 1 tablet (100 mcg total) by mouth daily before breakfast., Disp: 90 tablet, Rfl: 3 .  metFORMIN (GLUCOPHAGE-XR) 500 MG 24 hr tablet, Take 1 tablet (500 mg total) by mouth 2 (two) times daily., Disp: 60 tablet, Rfl: 2 .  metoprolol succinate (TOPROL-XL) 50 MG 24 hr tablet, Take 1 tablet (50 mg total) by mouth at bedtime. Take with or immediately following a meal., Disp: , Rfl:  .  naproxen sodium (ALEVE) 220 MG tablet, Take 220-440 mg by mouth See admin instructions. Take 220 mg by mouth in  the morning and take 440 mg by mouth at bedtime, Disp: , Rfl:  .  omeprazole (PRILOSEC) 20 MG capsule, Take 20 mg by mouth daily. , Disp: , Rfl:  .  sertraline (ZOLOFT) 100 MG tablet, Take 1 tablet (100 mg total) by mouth daily., Disp: 90 tablet, Rfl: 1 .  spironolactone (ALDACTONE) 25 MG tablet, Take 25 mg by mouth daily. With breakfast, Disp: , Rfl:  .  tiZANidine (ZANAFLEX) 2 MG tablet, Take 1 tablet (2 mg total) by mouth at bedtime. Instead of cyclobenzaprine, Disp: 30 tablet, Rfl: 0 .  Vitamin D, Cholecalciferol, 50 MCG (2000 UT) CAPS, Take 2,000 Units by mouth daily.  , Disp: , Rfl:  .  albuterol (PROVENTIL HFA;VENTOLIN HFA) 108 (90 Base) MCG/ACT inhaler, Inhale 2 puffs into the lungs every 6 (six) hours as needed for wheezing or shortness of breath., Disp: 1 Inhaler, Rfl: 1 .  doxycycline (VIBRA-TABS) 100 MG tablet, Take 1 tablet (100 mg total) by mouth 2 (two) times daily for 10 days., Disp: 20 tablet, Rfl: 0 .  fexofenadine (ALLEGRA) 180 MG tablet, Take 180 mg by mouth daily as needed for allergies or rhinitis., Disp: , Rfl:  .  predniSONE (DELTASONE) 10 MG tablet, Take 5 tablets (50 mg total) by mouth daily with breakfast for 1 day, THEN 4 tablets (40 mg total) daily with breakfast for 1 day, THEN 3 tablets (30 mg total) daily with breakfast for 1 day, THEN 2 tablets (20 mg total) daily with breakfast for 1 day, THEN 1 tablet (10 mg total) daily with breakfast for 1 day., Disp: 15 tablet, Rfl: 0 .  promethazine-dextromethorphan (PROMETHAZINE-DM) 6.25-15 MG/5ML syrup, Take 5 mLs by mouth 4 (four) times daily as needed., Disp: 118 mL, Rfl: 0  Allergies  Allergen Reactions  . Shellfish Allergy Rash and Swelling  . Codeine Nausea And Vomiting  . Sulfa Antibiotics Swelling    I personally reviewed active problem list, medication list, allergies, notes from last encounter, lab results with the patient/caregiver today.  ROS  Ten systems reviewed and is negative except as mentioned in HPI  Objective  Vitals:   10/18/18 1053  BP: 126/78  Pulse: 85  Resp: 14  Temp: 97.8 F (36.6 C)  SpO2: 95%  Weight: 195 lb 8 oz (88.7 kg)  Height: 5\' 4"  (1.626 m)   Body mass index is 33.56 kg/m.  Nursing Note and Vital Signs reviewed.  Physical Exam  Constitutional: Patient appears well-developed and well-nourished. No distress.  HEENT: head atraumatic, normocephalic, pupils equal and reactive to light, Bilateral TM's without erythema or effusion,  bilateral maxillary sinuses are tender and LEFT frontal sinus is tender. Neck supple but does exhibit mild  submandibular lymphadenopathy, throat within normal limits - no erythema or exudate, no tonsillar swelling Cardiovascular: Normal rate, regular rhythm and normal heart sounds.  No murmur heard. No BLE edema. Pulmonary/Chest: Effort normal and breath sounds clear bilaterally. No respiratory distress.  There is a moderately congested cough throughout examination. Psychiatric: Patient has a normal mood and affect. behavior is normal. Judgment and thought content normal. Neurological: Pt is alert and oriented to person, place, and time. No cranial nerve deficit. Coordination, balance, strength, speech and gait are normal.  Skin: Skin is warm and dry. No rash noted. No erythema.  Psychiatric: Patient has a normal mood and affect. behavior is normal. Judgment and thought content normal.   Results for orders placed or performed in visit on 10/18/18 (from the past 72 hour(s))  POCT urinalysis  dipstick     Status: Abnormal   Collection Time: 10/18/18 11:25 AM  Result Value Ref Range   Color, UA Yellow    Clarity, UA Clear    Glucose, UA Negative Negative   Bilirubin, UA Negative    Ketones, UA Negative    Spec Grav, UA 1.010 1.010 - 1.025   Blood, UA Negative    pH, UA 6.5 5.0 - 8.0   Protein, UA Negative Negative   Urobilinogen, UA negative (A) 0.2 or 1.0 E.U./dL   Nitrite, UA Negative    Leukocytes, UA Negative Negative   Appearance     Odor      Assessment & Plan  1. Acute non-recurrent maxillary sinusitis - doxycycline (VIBRA-TABS) 100 MG tablet; Take 1 tablet (100 mg total) by mouth 2 (two) times daily for 10 days.  Dispense: 20 tablet; Refill: 0 - Symptoms ongoing for several weeks with second sickening occurring about 3 days ago - suspect bacterial sinusitis with concurrent bronchitis.  2. Bronchitis - promethazine-dextromethorphan (PROMETHAZINE-DM) 6.25-15 MG/5ML syrup; Take 5 mLs by mouth 4 (four) times daily as needed.  Dispense: 118 mL; Refill: 0 - predniSONE (DELTASONE) 10 MG  tablet; Take 5 tablets (50 mg total) by mouth daily with breakfast for 1 day, THEN 4 tablets (40 mg total) daily with breakfast for 1 day, THEN 3 tablets (30 mg total) daily with breakfast for 1 day, THEN 2 tablets (20 mg total) daily with breakfast for 1 day, THEN 1 tablet (10 mg total) daily with breakfast for 1 day.  Dispense: 15 tablet; Refill: 0 - albuterol (PROVENTIL HFA;VENTOLIN HFA) 108 (90 Base) MCG/ACT inhaler; Inhale 2 puffs into the lungs every 6 (six) hours as needed for wheezing or shortness of breath.  Dispense: 1 Inhaler; Refill: 1  3. Right flank pain - POCT urinalysis dipstick  -Red flags and when to present for emergency care or RTC including fever >101.23F, chest pain, shortness of breath, new/worsening/un-resolving symptoms, reviewed with patient at time of visit. Follow up and care instructions discussed and provided in AVS.

## 2018-10-20 ENCOUNTER — Encounter: Payer: Self-pay | Admitting: Family Medicine

## 2018-10-20 DIAGNOSIS — M8580 Other specified disorders of bone density and structure, unspecified site: Secondary | ICD-10-CM | POA: Insufficient documentation

## 2018-10-30 ENCOUNTER — Other Ambulatory Visit: Payer: Self-pay | Admitting: Family Medicine

## 2018-11-07 ENCOUNTER — Other Ambulatory Visit: Payer: Self-pay | Admitting: Family Medicine

## 2018-11-07 DIAGNOSIS — R7303 Prediabetes: Secondary | ICD-10-CM

## 2018-11-07 NOTE — Telephone Encounter (Signed)
Lab Results  Component Value Date   CREATININE 0.85 10/11/2018   Lab Results  Component Value Date   HGBA1C 6.1 (H) 10/11/2018

## 2018-12-25 ENCOUNTER — Other Ambulatory Visit: Payer: Self-pay | Admitting: Family Medicine

## 2018-12-25 DIAGNOSIS — F419 Anxiety disorder, unspecified: Secondary | ICD-10-CM

## 2018-12-25 DIAGNOSIS — F329 Major depressive disorder, single episode, unspecified: Secondary | ICD-10-CM

## 2018-12-25 DIAGNOSIS — F32A Depression, unspecified: Secondary | ICD-10-CM

## 2019-01-01 ENCOUNTER — Other Ambulatory Visit: Payer: Self-pay | Admitting: Family Medicine

## 2019-01-08 ENCOUNTER — Other Ambulatory Visit: Payer: Self-pay | Admitting: Family Medicine

## 2019-01-08 DIAGNOSIS — E782 Mixed hyperlipidemia: Secondary | ICD-10-CM

## 2019-02-10 ENCOUNTER — Other Ambulatory Visit: Payer: Self-pay | Admitting: Family Medicine

## 2019-02-10 DIAGNOSIS — R7303 Prediabetes: Secondary | ICD-10-CM

## 2019-02-12 ENCOUNTER — Telehealth: Payer: Self-pay | Admitting: Family Medicine

## 2019-02-12 DIAGNOSIS — R7303 Prediabetes: Secondary | ICD-10-CM

## 2019-02-13 NOTE — Telephone Encounter (Signed)
Pt stated that she contacted her pharmacy for a refill of metFORMIN (GLUCOPHAGE-XR) 500 MG 24 hr tablet but the office keeps denying the refill. She would like to know what she needs to do to receive refill. Requesting callback [phone]

## 2019-02-13 NOTE — Telephone Encounter (Signed)
Called in.

## 2019-02-19 ENCOUNTER — Ambulatory Visit: Payer: Medicare Other | Admitting: Family Medicine

## 2019-02-19 ENCOUNTER — Encounter: Payer: Self-pay | Admitting: Family Medicine

## 2019-02-19 ENCOUNTER — Other Ambulatory Visit: Payer: Self-pay

## 2019-02-19 VITALS — BP 146/72 | HR 74 | Temp 96.2°F | Resp 16 | Ht 64.0 in | Wt 189.1 lb

## 2019-02-19 DIAGNOSIS — R1013 Epigastric pain: Secondary | ICD-10-CM

## 2019-02-19 DIAGNOSIS — R1011 Right upper quadrant pain: Secondary | ICD-10-CM | POA: Diagnosis not present

## 2019-02-19 DIAGNOSIS — I10 Essential (primary) hypertension: Secondary | ICD-10-CM | POA: Diagnosis not present

## 2019-02-19 MED ORDER — OMEPRAZOLE 40 MG PO CPDR: 40.0000 mg | DELAYED_RELEASE_CAPSULE | Freq: Every day | ORAL | 0 refills | Status: DC

## 2019-02-19 NOTE — Progress Notes (Signed)
Name: Brittany Hill   MRN: [id number]    DOB: 08/17/1950   Date:02/19/2019       Progress Note  Subjective  Chief Complaint  Chief Complaint  Patient presents with  . Diarrhea    Onset- Thursday, ate some greasy food and since then she has gotten better every day since  . Nausea    States she has had several waves of nausea but no emesis.   . Abdominal Pain    Burning in her middle of her stomach and sharp pain in her right lower abdomen  . Shoulder Pain    Inbetween her shoulder pain    HPI  RUQ pain: she states she had a greasy snack last Thursday and when she got home she developed RUQ described as a pinching/throbbing pain that radiated to the middle of her shoulder blades. She did felt nauseated but no vomiting or fever. She also had multiple episodes of loose stools that were orange in color and had a strong odor. Symptoms resolved after a few days. She is now afraid to eat, and has been on a very plain diet  Dyspepsia: she has noticed epigastric burning sensation and some indigestion for since COVID-19 started. She is also the executor of her uncle's will and his children are giving her a hard time, she has been very stressed about it.  HTN: sees cardiologist, bp slightly up but has follow up in August with them.No chest pain or palpitation   Patient Active Problem List   Diagnosis Date Noted  . Osteopenia 10/20/2018  . Obesity (BMI 30.0-34.9) 09/27/2018  . Cataract, left 04/02/2018  . Asthma without status asthmaticus 10/05/2017  . PONV (postoperative nausea and vomiting) 10/05/2017  . Genetic testing 08/28/2017  . Need for 23-polyvalent pneumococcal polysaccharide vaccine 08/02/2017  . Family hx-breast malignancy 08/02/2017  . Full code status 08/02/2017  . Dense breast tissue on mammogram 07/28/2016  . Screening mammogram, encounter for 07/28/2016  . Left leg pain 12/19/2015  . Left sided sciatica 12/19/2015  . Atelectasis of both lungs 12/16/2015  . Left lumbar  radiculopathy 12/16/2015  . Hypomagnesemia 12/06/2015  . Medication monitoring encounter 12/02/2015  . Hypokalemia 12/02/2015  . Preventative health care 07/28/2015  . Fibromyalgia   . MS (multiple sclerosis) (HCC)   . Chronic constipation   . OA (osteoarthritis)   . Hypothyroidism   . IFG (impaired fasting glucose)   . Hypertension   . Hyperlipidemia   . GERD (gastroesophageal reflux disease)   . Depression   . Anxiety   . TMJ syndrome     Past Surgical History:  Procedure Laterality Date  . ABDOMINAL HYSTERECTOMY     complete at age 68; HRT for ~25y  . BACK SURGERY    . CATARACT EXTRACTION W/PHACO Left 09/27/2017   Procedure: CATARACT EXTRACTION PHACO AND INTRAOCULAR LENS PLACEMENT (IOC);  Surgeon: [place] CraneKing, Bradley Mark, MD;  Location: [facility] ORS;  Service: Ophthalmology;  Laterality: Left;  Lot #[id number] H US:   00:27.2 AP%:  13.7 CDE:  3.75  . CATARACT EXTRACTION W/PHACO Right 10/25/2017   Procedure: CATARACT EXTRACTION PHACO AND INTRAOCULAR LENS PLACEMENT (IOC);  Surgeon: [place] CraneKing, Bradley Mark, MD;  Location: [facility] OR[facility];  Service: Ophthalmology;  Laterality: Right;  Lot # [id number] H US:   00:23.9 AP%   :8.7 CDE:   2.10  . Cataracts Bilateral    09/27/17 and 10/25/17  . COLONOSCOPY WITH ESOPHAGOGASTRODUODENOSCOPY (EGD)  12/2017   [facility]  . JOINT REPLACEMENT    .  KNEE ARTHROSCOPY Right    x 2  . lumbarectomy with fusion  [id number]  . ROTATOR CUFF REPAIR    . tear duct      surgery  . TMJ ARTHROPLASTY    . TOTAL KNEE ARTHROPLASTY Bilateral 09/2007    Family History  Problem Relation Age of Onset  . Heart disease Mother   . Hypertension Mother   . Lymphoma Mother 43       deceased 69  . Stroke Father   . Heart attack Father   . Heart disease Father   . Hypertension Father   . Alcohol abuse Father   . Hypertension Sister   . Colon cancer Sister 76  . Hypertension Brother   . Diabetes Brother   . Mental illness Brother   . Depression Brother   . Lung  cancer Brother 74       smoker; currently 57  . Alcohol abuse Son   . Breast cancer Maternal Aunt 75       currently 28  . Breast cancer Maternal Aunt        dx 21s; mets in 23s  . AAA (abdominal aortic aneurysm) Maternal Grandmother   . Heart disease Maternal Grandmother   . Heart attack Paternal Grandfather   . Throat cancer Paternal Uncle        smoker    Social History   Socioeconomic History  . Marital status: Married    Spouse name: Dorene Sorrow  . Number of children: 2  . Years of education: Not on file  . Highest education level: Master's degree (e.g., MA, MS, MEng, MEd, MSW, MBA)  Occupational History  . Occupation: retired  Engineer, production  . Financial resource strain: Not hard at all  . Food insecurity    Worry: Never true    Inability: Never true  . Transportation needs    Medical: No    Non-medical: No  Tobacco Use  . Smoking status: Never Smoker  . Smokeless tobacco: Never Used  Substance and Sexual Activity  . Alcohol use: No    Alcohol/week: 0.0 standard drinks  . Drug use: No  . Sexual activity: Not Currently  Lifestyle  . Physical activity    Days per week: 2 days    Minutes per session: 60 min  . Stress: To some extent  Relationships  . Social connections    Talks on phone: More than three times a week    Gets together: More than three times a week    Attends religious service: More than 4 times per year    Active member of club or organization: No    Attends meetings of clubs or organizations: Never    Relationship status: Married  . Intimate partner violence    Fear of current or ex partner: No    Emotionally abused: No    Physically abused: No    Forced sexual activity: No  Other Topics Concern  . Not on file  Social History Narrative  . Not on file     Current Outpatient Medications:  .  albuterol (PROVENTIL HFA;VENTOLIN HFA) 108 (90 Base) MCG/ACT inhaler, Inhale 2 puffs into the lungs every 6 (six) hours as needed for wheezing or  shortness of breath., Disp: 1 Inhaler, Rfl: 1 .  calcium carbonate (CALCIUM 600) 600 MG TABS tablet, Take 600 mg by mouth as needed. , Disp: , Rfl:  .  EPINEPHrine 0.3 mg/0.3 mL IJ SOAJ injection, Inject 0.3 mg into the muscle once. ,  Disp: , Rfl:  .  ezetimibe-simvastatin (VYTORIN) 10-40 MG tablet, Take 1 tablet by mouth at bedtime., Disp: 90 tablet, Rfl: 0 .  fexofenadine (ALLEGRA) 180 MG tablet, Take 180 mg by mouth daily as needed for allergies or rhinitis., Disp: , Rfl:  .  fluticasone (FLONASE) 50 MCG/ACT nasal spray, Place 2 sprays into both nostrils daily., Disp: , Rfl:  .  levothyroxine (SYNTHROID) 100 MCG tablet, Take 1 tablet (100 mcg total) by mouth daily before breakfast., Disp: 90 tablet, Rfl: 3 .  metFORMIN (GLUCOPHAGE-XR) 500 MG 24 hr tablet, Take 1 tablet (500 mg total) by mouth 2 (two) times daily., Disp: 180 tablet, Rfl: 1 .  metoprolol succinate (TOPROL-XL) 50 MG 24 hr tablet, Take 1 tablet (50 mg total) by mouth at bedtime. Take with or immediately following a meal., Disp: , Rfl:  .  naproxen sodium (ALEVE) 220 MG tablet, Take 220-440 mg by mouth See admin instructions. Take 220 mg by mouth in the morning and take 440 mg by mouth at bedtime, Disp: , Rfl:  .  omeprazole (PRILOSEC) 20 MG capsule, Take 20 mg by mouth daily. , Disp: , Rfl:  .  sertraline (ZOLOFT) 100 MG tablet, Take 1 tablet (100 mg total) by mouth daily., Disp: 90 tablet, Rfl: 0 .  spironolactone (ALDACTONE) 50 MG tablet, Take 25 mg by mouth daily. With breakfast, Disp: , Rfl:  .  tiZANidine (ZANAFLEX) 2 MG tablet, Take 1 tablet (2 mg total) by mouth at bedtime. Instead of cyclobenzaprine, Disp: 30 tablet, Rfl: 0 .  Vitamin D, Cholecalciferol, 50 MCG (2000 UT) CAPS, Take 2,000 Units by mouth daily. , Disp: , Rfl:  .  promethazine-dextromethorphan (PROMETHAZINE-DM) 6.25-15 MG/5ML syrup, Take 5 mLs by mouth 4 (four) times daily as needed. (Patient not taking: Reported on 02/19/2019), Disp: 118 mL, Rfl: 0  Allergies   Allergen Reactions  . Shellfish Allergy Rash and Swelling  . Codeine Nausea And Vomiting  . Sulfa Antibiotics Swelling    I personally reviewed active problem list, medication list, allergies, family history, social history with the patient/caregiver today.   ROS  Ten systems reviewed and is negative except as mentioned in HPI   Objective  Vitals:   02/19/19 1418  BP: (!) 146/72  Pulse: 74  Resp: 16  Temp: (!) 96.2 F (35.7 C)  TempSrc: Temporal  SpO2: 99%  Weight: 189 lb 1.6 oz (85.8 kg)  Height: 5\' 4"  (1.626 m)    Body mass index is 32.46 kg/m.  Physical Exam  Constitutional: Patient appears well-developed and well-nourished. Obese  No distress.  HEENT: head atraumatic, normocephalic, pupils equal and reactive to light,neck supple Cardiovascular: Normal rate, regular rhythm and normal heart sounds.  No murmur heard. No BLE edema. Pulmonary/Chest: Effort normal and breath sounds normal. No respiratory distress. Abdominal: Soft.  There is tenderness on epigastric and ruq, no guarding or rebound Psychiatric: Patient has a normal mood and affect. behavior is normal. Judgment and thought content normal.   PHQ2/9: Depression screen [facility] 2/9 02/19/2019 10/18/2018 09/27/2018 08/06/2018 03/28/2018  Decreased Interest 1 0 0 0 1  Down, Depressed, Hopeless 1 0 0 0 1  PHQ - 2 Score 2 0 0 0 2  Altered sleeping 2 0 0 1 2  Tired, decreased energy 2 0 0 3 2  Change in appetite 1 0 0 1 2  Feeling bad or failure about yourself  0 0 0 0 0  Trouble concentrating 0 0 0 0 1  Moving slowly or  fidgety/restless 0 0 0 0 1  Suicidal thoughts 0 0 0 0 0  PHQ-9 Score 7 0 0 5 10  Difficult doing work/chores Not difficult at all Not difficult at all Not difficult at all Not difficult at all Somewhat difficult    phq 9 is positive   Fall Risk: Fall Risk  02/19/2019 10/18/2018 09/27/2018 08/09/2018 08/06/2018  Falls in the past year? - 0 0 0 0  Number falls in past yr: 0 - 0 0 0  Injury with  Fall? 0 - 0 0 -    Functional Status Survey: Is the patient deaf or have difficulty hearing?: No Does the patient have difficulty seeing, even when wearing glasses/contacts?: Yes Does the patient have difficulty concentrating, remembering, or making decisions?: No Does the patient have difficulty walking or climbing stairs?: No Does the patient have difficulty dressing or bathing?: No Does the patient have difficulty doing errands alone such as visiting a doctor's office or shopping?: No    Assessment & Plan   1. RUQ pain  - COMPLETE METABOLIC PANEL WITH GFR - CBC with Differential/Platelet - US Abdomen Limited RUQ; Future  2. Dyspepsia  - COMPLETE METABOLIC PANEL WITH GFR - CBC with Differential/Platelet - H. pylori breath test - omeprazole (PRILOSEC) 40 MG capsule; Take 1 capsule (40 mg total) by mouth daily.  Dispense: 90 capsule; Refill: 0  3. Essential hypertension

## 2019-02-20 LAB — COMPLETE METABOLIC PANEL WITH GFR
AG Ratio: 1.8 (calc) (ref 1.0–2.5)
ALT: 19 U/L (ref 6–29)
AST: 20 U/L (ref 10–35)
Albumin: 4.8 g/dL (ref 3.6–5.1)
Alkaline phosphatase (APISO): 33 U/L — ABNORMAL LOW (ref 37–153)
BUN: 14 mg/dL (ref 7–25)
CO2: 24 mmol/L (ref 20–32)
Calcium: 10.3 mg/dL (ref 8.6–10.4)
Chloride: 102 mmol/L (ref 98–110)
Creat: 0.8 mg/dL (ref 0.50–0.99)
GFR, Est African American: 88 mL/min/{1.73_m2} (ref >=60)
GFR, Est Non African American: 76 mL/min/{1.73_m2} (ref >=60)
Globulin: 2.7 g/dL (calc) (ref 1.9–3.7)
Glucose, Bld: 96 mg/dL (ref 65–99)
Potassium: 4.4 mmol/L (ref 3.5–5.3)
Sodium: 137 mmol/L (ref 135–146)
Total Bilirubin: 0.3 mg/dL (ref 0.2–1.2)
Total Protein: 7.5 g/dL (ref 6.1–8.1)

## 2019-02-20 LAB — CBC WITH DIFFERENTIAL/PLATELET
Absolute Monocytes: 655 cells/uL (ref 200–950)
Basophils Absolute: 43 cells/uL (ref 0–200)
Basophils Relative: 0.6 %
Eosinophils Absolute: 202 cells/uL (ref 15–500)
Eosinophils Relative: 2.8 %
HCT: 39.5 % (ref 35.0–45.0)
Hemoglobin: 13.5 g/dL (ref 11.7–15.5)
Lymphs Abs: 1872 cells/uL (ref 850–3900)
MCH: 30.1 pg (ref 27.0–33.0)
MCHC: 34.2 g/dL (ref 32.0–36.0)
MCV: 88.2 fL (ref 80.0–100.0)
MPV: 10.4 fL (ref 7.5–12.5)
Monocytes Relative: 9.1 %
Neutro Abs: 4428 cells/uL (ref 1500–7800)
Neutrophils Relative %: 61.5 %
Platelets: 227 10*3/uL (ref 140–400)
RBC: 4.48 10*6/uL (ref 3.80–5.10)
RDW: 13.2 % (ref 11.0–15.0)
Total Lymphocyte: 26 %
WBC: 7.2 10*3/uL (ref 3.8–10.8)

## 2019-02-20 LAB — SPECIMEN COMPROMISED

## 2019-02-20 LAB — H. PYLORI BREATH TEST: H. pylori Breath Test: NOT DETECTED

## 2019-02-26 ENCOUNTER — Encounter: Payer: Self-pay | Admitting: Family Medicine

## 2019-02-28 ENCOUNTER — Other Ambulatory Visit: Payer: Self-pay | Admitting: Family Medicine

## 2019-03-03 ENCOUNTER — Other Ambulatory Visit: Payer: Self-pay | Admitting: Family Medicine

## 2019-03-03 ENCOUNTER — Other Ambulatory Visit: Payer: Self-pay

## 2019-03-03 ENCOUNTER — Encounter: Payer: Self-pay | Admitting: Family Medicine

## 2019-03-03 ENCOUNTER — Ambulatory Visit
Admission: RE | Admit: 2019-03-03 | Discharge: 2019-03-03 | Disposition: A | Discharge status: Home / Self Care | Payer: Medicare Other | Source: Ambulatory Visit | Attending: Family Medicine | Admitting: Family Medicine

## 2019-03-03 DIAGNOSIS — R1011 Right upper quadrant pain: Secondary | ICD-10-CM | POA: Insufficient documentation

## 2019-03-07 ENCOUNTER — Encounter: Payer: Self-pay | Admitting: Family Medicine

## 2019-03-18 ENCOUNTER — Encounter: Payer: Self-pay | Admitting: Family Medicine

## 2019-03-20 ENCOUNTER — Other Ambulatory Visit: Payer: Self-pay | Admitting: Family Medicine

## 2019-03-20 ENCOUNTER — Other Ambulatory Visit: Payer: Self-pay

## 2019-03-20 ENCOUNTER — Ambulatory Visit
Admission: RE | Admit: 2019-03-20 | Discharge: 2019-03-20 | Disposition: A | Discharge status: Home / Self Care | Payer: Medicare Other | Source: Ambulatory Visit | Attending: Family Medicine | Admitting: Family Medicine

## 2019-03-20 DIAGNOSIS — R1011 Right upper quadrant pain: Secondary | ICD-10-CM | POA: Diagnosis not present

## 2019-03-20 DIAGNOSIS — R1013 Epigastric pain: Secondary | ICD-10-CM

## 2019-03-20 MED ORDER — TECHNETIUM TC 99M MEBROFENIN IV KIT: 5.0000 | PACK | Freq: Once | INTRAVENOUS | Status: DC | PRN

## 2019-03-28 ENCOUNTER — Other Ambulatory Visit: Payer: Self-pay

## 2019-03-28 ENCOUNTER — Encounter: Payer: Self-pay | Admitting: Nurse Practitioner

## 2019-03-28 ENCOUNTER — Ambulatory Visit: Payer: Medicare Other | Admitting: Nurse Practitioner

## 2019-03-28 VITALS — BP 108/72 | HR 69 | Temp 96.9°F | Resp 14 | Ht 64.0 in | Wt 188.8 lb

## 2019-03-28 DIAGNOSIS — I10 Essential (primary) hypertension: Secondary | ICD-10-CM

## 2019-03-28 DIAGNOSIS — J4520 Mild intermittent asthma, uncomplicated: Secondary | ICD-10-CM | POA: Diagnosis not present

## 2019-03-28 DIAGNOSIS — G35D Multiple sclerosis, unspecified: Secondary | ICD-10-CM

## 2019-03-28 DIAGNOSIS — K219 Gastro-esophageal reflux disease without esophagitis: Secondary | ICD-10-CM

## 2019-03-28 DIAGNOSIS — E039 Hypothyroidism, unspecified: Secondary | ICD-10-CM | POA: Diagnosis not present

## 2019-03-28 DIAGNOSIS — M542 Cervicalgia: Secondary | ICD-10-CM

## 2019-03-28 DIAGNOSIS — G35 Multiple sclerosis: Secondary | ICD-10-CM

## 2019-03-28 DIAGNOSIS — Z5181 Encounter for therapeutic drug level monitoring: Secondary | ICD-10-CM

## 2019-03-28 DIAGNOSIS — F3342 Major depressive disorder, recurrent, in full remission: Secondary | ICD-10-CM

## 2019-03-28 DIAGNOSIS — M8589 Other specified disorders of bone density and structure, multiple sites: Secondary | ICD-10-CM

## 2019-03-28 DIAGNOSIS — K760 Fatty (change of) liver, not elsewhere classified: Secondary | ICD-10-CM

## 2019-03-28 DIAGNOSIS — R7301 Impaired fasting glucose: Secondary | ICD-10-CM

## 2019-03-28 DIAGNOSIS — E782 Mixed hyperlipidemia: Secondary | ICD-10-CM

## 2019-03-28 DIAGNOSIS — M5416 Radiculopathy, lumbar region: Secondary | ICD-10-CM

## 2019-03-28 DIAGNOSIS — F419 Anxiety disorder, unspecified: Secondary | ICD-10-CM

## 2019-03-28 MED ORDER — MELOXICAM 15 MG PO TABS: 7.5000 mg | ORAL_TABLET | Freq: Every day | ORAL | 0 refills | Status: DC

## 2019-03-28 MED ORDER — DULOXETINE HCL 20 MG PO CPEP: 20.0000 mg | ORAL_CAPSULE | Freq: Every day | ORAL | 1 refills | Status: DC

## 2019-03-28 NOTE — Progress Notes (Signed)
Name: Brittany Hill   MRN: [id number]    DOB: 05/21/1951   Date:03/28/2019       Progress Note  Subjective  Chief Complaint  Chief Complaint  Patient presents with  . Follow-up    with labs  . Arm Pain    right, pt thinks coming from neck    HPI  patient has neck pain shoots down right arm ongoing for 2-3 weeks now, taking tylenol and naproxen with some relief. wheres splint at night. Has orthopedic appointment next week. Denies injury.  Hypertension Patient is on metoprolol 50mg  daily and spironolactone 25mg  daily.  Takes medications as prescribed with no missed doses a month.  She is compliant with low-salt diet.  She checks blood pressures at home with range of 138-145/70-80's Denies chest pain, headaches, blurry vision. Denies lightheadedness or dizziness with position changes.  BP Readings from Last 3 Encounters:  03/28/19 108/72  02/19/19 (!) 146/72  10/18/18 126/78    Hyperlipidemia Patient rx zetia 10mg  and simvastatin 40mg  nightly Takes medications as prescribed with no missed doses a month.  Diet: vegetables daily, avoids fried foods- causing gall bladder pain  Denies myalgias Lab Results  Component Value Date   CHOL 161 10/11/2018   HDL 52 10/11/2018   LDLCALC 86 10/11/2018   TRIG 130 10/11/2018   CHOLHDL 3.1 10/11/2018    Hypothyroidism Patient rx levothryoxine 100 mcg daily. Has been on this dose for few years. Patient denies fatigue/palpitations, insomnia, constipation/diarrhea, hot/cold intolerances,   Lab Results  Component Value Date   TSH 1.86 10/11/2018    Asthma As a child, last asthma attack was in her 20's   Osteopenia Takes vitamin D and calcium supplementation Weight bearing exercises: walking Assistive devices: none Last DEXA scan 10/17/2018 - osteopenia of l1-l2 and right femoral neck  Gastroesophageal reflux disease without esophagitis  takes prilosec 40mg  daily states has improved recently worsening abdominal pain.   Triggers- tomatoes, vinegar   Prediabetes Takes metformin 500mg  BID Lab Results  Component Value Date   HGBA1C 6.1 (H) 10/11/2018    MS (multiple sclerosis)  Has not seen neurologist in a long-time States she is not up for follow-ing and does not want any medication for this at this time. Has some fatigue and muscle spasms.   Anxiety & Depression  She is taking zoloft 100mg  daily- states this helps her but feels she needs more has increased dose in the past without further improvement. Came off of it in the past and was crying all the time. She has additionally tried prozac in the past.    PHQ2/9: Depression screen [facility] 03/28/2019 02/19/2019 10/18/2018 09/27/2018 08/06/2018  Decreased Interest 1 1 0 0 0  Down, Depressed, Hopeless 3 1 0 0 0  PHQ - 2 Score 4 2 0 0 0  Altered sleeping 3 2 0 0 1  Tired, decreased energy 3 2 0 0 3  Change in appetite 0 1 0 0 1  Feeling bad or failure about yourself  0 0 0 0 0  Trouble concentrating 1 0 0 0 0  Moving slowly or fidgety/restless 0 0 0 0 0  Suicidal thoughts 0 0 0 0 0  PHQ-9 Score 11 7 0 0 5  Difficult doing work/chores Somewhat difficult Not difficult at all Not difficult at all Not difficult at all Not difficult at all  Some recent data might be hidden    PHQ reviewed. Positive  Patient Active Problem List   Diagnosis Date Noted  .  Osteopenia 10/20/2018  . Obesity (BMI 30.0-34.9) 09/27/2018  . Cataract, left 04/02/2018  . Osteoarthritis of left ankle 02/16/2018  . Asthma without status asthmaticus 10/05/2017  . Genetic testing 08/28/2017  . Family hx-breast malignancy 08/02/2017  . Dense breast tissue on mammogram 07/28/2016  . Left sided sciatica 12/19/2015  . Left lumbar radiculopathy 12/16/2015  . Hypomagnesemia 12/06/2015  . Hypokalemia 12/02/2015  . Fibromyalgia   . MS (multiple sclerosis) (HCC)   . Chronic constipation   . OA (osteoarthritis)   . Hypothyroidism   . IFG (impaired fasting glucose)   .  Hypertension   . Hyperlipidemia   . GERD (gastroesophageal reflux disease)   . MDD (major depressive disorder), recurrent, in full remission (HCC)   . Anxiety   . TMJ syndrome     Past Medical History:  Diagnosis Date  . Allergy   . Anxiety   . Asthma    as a teenager  . Chronic constipation   . Complication of anesthesia   . Depression   . Dysrhythmia   . Fibromyalgia    Bells Palsy  . Full code status 08/02/2017   Patient wishes to have resuscitative efforts made if collapses; but does not want to remain on life support or if condition is terminal -- see copy of living will  . Genetic testing 08/28/2017   Multi-Cancer panel (83 genes) @ [facility] - No pathogenic mutations detected  . GERD (gastroesophageal reflux disease)   . Heart murmur   . Hyperlipidemia   . Hypertension   . Hypothyroidism   . IFG (impaired fasting glucose)   . MS (multiple sclerosis) (HCC)    hx of long-term corticosteroid use  . OA (osteoarthritis)    osteoporosis  . Osteoporosis 05/2014   managed by Rheumatologist  . Pneumonia 2017  . PONV (postoperative nausea and vomiting)   . TMJ syndrome     Past Surgical History:  Procedure Laterality Date  . ABDOMINAL HYSTERECTOMY     complete at age 41; HRT for ~25y  . BACK SURGERY    . CATARACT EXTRACTION W/PHACO Left 09/27/2017   Procedure: CATARACT EXTRACTION PHACO AND INTRAOCULAR LENS PLACEMENT (IOC);  Surgeon: Nevada Crane, MD;  Location: [facility] ORS;  Service: Ophthalmology;  Laterality: Left;  Lot #[id number] H [place]:   00:27.2 AP%:  13.7 CDE:  3.75  . CATARACT EXTRACTION W/PHACO Right 10/25/2017   Procedure: CATARACT EXTRACTION PHACO AND INTRAOCULAR LENS PLACEMENT (IOC);  Surgeon: Nevada Crane, MD;  Location: [facility] ORS;  Service: Ophthalmology;  Laterality: Right;  Lot # [id number] H [place]:   00:23.9 AP%   :8.7 CDE:   2.10  . Cataracts Bilateral    09/27/17 and 10/25/17  . COLONOSCOPY WITH ESOPHAGOGASTRODUODENOSCOPY (EGD)  12/2017   [facility]  . JOINT REPLACEMENT    . KNEE ARTHROSCOPY Right    x 2  . lumbarectomy with fusion  [id number]  . ROTATOR CUFF REPAIR    . tear duct      surgery  . TMJ ARTHROPLASTY    . TOTAL KNEE ARTHROPLASTY Bilateral 09/2007    Social History   Tobacco Use  . Smoking status: Never Smoker  . Smokeless tobacco: Never Used  Substance Use Topics  . Alcohol use: No    Alcohol/week: 0.0 standard drinks     Current Outpatient Medications:  .  calcium carbonate (CALCIUM 600) 600 MG TABS tablet, Take 600 mg by mouth as needed. , Disp: , Rfl:  .  EPINEPHrine 0.3 mg/0.3 mL  IJ SOAJ injection, Inject 0.3 mg into the muscle once. , Disp: , Rfl:  .  ezetimibe-simvastatin (VYTORIN) 10-40 MG tablet, Take 1 tablet by mouth at bedtime., Disp: 90 tablet, Rfl: 0 .  fexofenadine (ALLEGRA) 180 MG tablet, Take 180 mg by mouth daily as needed for allergies or rhinitis., Disp: , Rfl:  .  fluticasone (FLONASE) 50 MCG/ACT nasal spray, Place 2 sprays into both nostrils daily., Disp: , Rfl:  .  levothyroxine (SYNTHROID) 100 MCG tablet, Take 1 tablet (100 mcg total) by mouth daily before breakfast., Disp: 90 tablet, Rfl: 3 .  metFORMIN (GLUCOPHAGE-XR) 500 MG 24 hr tablet, Take 1 tablet (500 mg total) by mouth 2 (two) times daily., Disp: 180 tablet, Rfl: 1 .  metoprolol succinate (TOPROL-XL) 50 MG 24 hr tablet, Take 1 tablet (50 mg total) by mouth at bedtime. Take with or immediately following a meal., Disp: , Rfl:  .  naproxen sodium (ALEVE) 220 MG tablet, Take 220-440 mg by mouth See admin instructions. Take 220 mg by mouth in the morning and take 440 mg by mouth at bedtime, Disp: , Rfl:  .  omeprazole (PRILOSEC) 40 MG capsule, Take 1 capsule (40 mg total) by mouth daily., Disp: 90 capsule, Rfl: 0 .  sertraline (ZOLOFT) 100 MG tablet, Take 1 tablet (100 mg total) by mouth daily., Disp: 90 tablet, Rfl: 0 .  spironolactone (ALDACTONE) 50 MG tablet, Take 25 mg by mouth daily. With breakfast, Disp: , Rfl:  .   tiZANidine (ZANAFLEX) 2 MG tablet, Take 1 tablet (2 mg total) by mouth at bedtime. Instead of cyclobenzaprine, Disp: 30 tablet, Rfl: 0 .  Vitamin D, Cholecalciferol, 50 MCG (2000 UT) CAPS, Take 2,000 Units by mouth daily. , Disp: , Rfl:  .  albuterol (PROVENTIL HFA;VENTOLIN HFA) 108 (90 Base) MCG/ACT inhaler, Inhale 2 puffs into the lungs every 6 (six) hours as needed for wheezing or shortness of breath. (Patient not taking: Reported on 03/28/2019), Disp: 1 Inhaler, Rfl: 1 .  promethazine-dextromethorphan (PROMETHAZINE-DM) 6.25-15 MG/5ML syrup, Take 5 mLs by mouth 4 (four) times daily as needed. (Patient not taking: Reported on 03/28/2019), Disp: 118 mL, Rfl: 0  Allergies  Allergen Reactions  . Shellfish Allergy Rash and Swelling  . Codeine Nausea And Vomiting  . Sulfa Antibiotics Swelling    ROS   No other specific complaints in a complete review of systems (except as listed in HPI above).  Objective  Vitals:   03/28/19 0907  BP: 108/72  Pulse: 69  Resp: 14  Temp: (!) 96.9 F (36.1 C)  TempSrc: Oral  SpO2: 98%  Weight: 188 lb 12.8 oz (85.6 kg)  Height: 5\' 4"  (1.626 m)    Body mass index is 32.41 kg/m.  Nursing Note and Vital Signs reviewed.  Physical Exam Vitals signs reviewed.  Constitutional:      Appearance: She is well-developed.  HENT:     Head: Normocephalic and atraumatic.  Neck:     Musculoskeletal: Normal range of motion and neck supple.     Vascular: No carotid bruit.  Cardiovascular:     Heart sounds: Normal heart sounds.  Pulmonary:     Effort: Pulmonary effort is normal.     Breath sounds: Normal breath sounds.  Abdominal:     General: Bowel sounds are normal.     Palpations: Abdomen is soft.     Tenderness: There is no abdominal tenderness.  Musculoskeletal:     Cervical back: She exhibits decreased range of motion and tenderness. She  exhibits no bony tenderness, no swelling, no edema and no deformity.  Skin:    General: Skin is warm and dry.      Capillary Refill: Capillary refill takes less than 2 seconds.  Neurological:     Mental Status: She is alert and oriented to person, place, and time.     GCS: GCS eye subscore is 4. GCS verbal subscore is 5. GCS motor subscore is 6.     Sensory: No sensory deficit.  Psychiatric:        Speech: Speech normal.        Behavior: Behavior normal.        Thought Content: Thought content normal.        Judgment: Judgment normal.        No results found for this or any previous visit (from the past 48 hour(s)).  Assessment & Plan  1. Essential hypertension Low normal today, patient is asymptomatic states has not drank anything will monitor at home and we can adjust meds as needed.   2. Mild intermittent asthma without status asthmaticus without complication No complications in the last decades  3. Gastroesophageal reflux disease without esophagitis Improved, has follow up with GI   4. Hypothyroidism, unspecified type stable  5. IFG (impaired fasting glucose) Continue metformin   6. Left lumbar radiculopathy - DULoxetine (CYMBALTA) 20 MG capsule; Take 1 capsule (20 mg total) by mouth daily.  Dispense: 90 capsule; Refill: 1  7. MS (multiple sclerosis) (HCC) stable  8. Anxiety See check out for titration instructions  - DULoxetine (CYMBALTA) 20 MG capsule; Take 1 capsule (20 mg total) by mouth daily.  Dispense: 90 capsule; Refill: 1  9. Mixed hyperlipidemia Continue meds, recheck labs at follow-up   10. Medication monitoring encounter   11. MDD (major depressive disorder), recurrent, in full remission ([place]) See check out for titration instructions  - DULoxetine (CYMBALTA) 20 MG capsule; Take 1 capsule (20 mg total) by mouth daily.  Dispense: 90 capsule; Refill: 1  12. Osteopenia of multiple sites Continue supplementation   13. Fatty liver disease, nonalcoholic Discussed weight loss   14. Neck pain Follow-up with ortho  - meloxicam (MOBIC) 15 MG tablet; Take 0.5-1  tablets (7.5-15 mg total) by mouth daily.  Dispense: 30 tablet; Refill: 0

## 2019-03-28 NOTE — Patient Instructions (Addendum)
- start taking 50mg  of zoloft daily with 20mg  of duloxetine daily for 4 days  - Stop zoloft completely, start taking duloxetine 40mg  daily there after; we can increase to 60mg  of duloxetine if not noticing improvements in 3 weeks.   - can take meloxicam with food while awaiting orthopedic appointment- Do NOT take other NSAIDs wit this and take this with meals only.   Nonalcoholic Fatty Liver Disease Diet, Adult Nonalcoholic fatty liver disease is a condition that causes fat to build up in and around the liver. The disease makes it harder for the liver to work the way that it should. Following a healthy diet can help to keep nonalcoholic fatty liver disease under control. It can also help to prevent or improve conditions that are associated with the disease, such as heart disease, diabetes, high blood pressure, and abnormal cholesterol levels. Along with regular exercise, this diet:  Promotes weight loss.  Helps to control blood sugar levels.  Helps to improve the way that the body uses insulin. What are tips for following this plan? Reading food labels Always check food labels for:  The amount of saturated fat in a food. You should limit your intake of saturated fat. Saturated fat is found in foods that come from animals, including meat and dairy products such as butter, cheese, and whole milk.  The amount of fiber in a food. You should choose high-fiber foods such as fruits, vegetables, and whole grains. Try to get 25-30 grams (g) of fiber a day.  Cooking  When cooking, use heart-healthy oils that are high in monounsaturated fats. These include olive oil, canola oil, and avocado oil.  Limit frying or deep-frying foods. Cook foods using healthy methods such as baking, boiling, steaming, and grilling instead. Meal planning  You may want to keep track of how many calories you take in. Eating the right amount of calories will help you achieve a healthy weight. Meeting with a registered  dietitian can help you get started.  Limit how often you eat takeout and fast food. These foods are usually very high in fat, salt, and sugar.  Use the glycemic index (GI) to plan your meals. The index tells you how quickly a food will raise your blood sugar. Choose low-GI foods (GI less than 55). These foods take a longer time to raise blood sugar. A registered dietitian can help you identify foods lower on the GI scale. Lifestyle  You may want to follow a Mediterranean diet. This diet includes a lot of vegetables, lean meats or fish, whole grains, fruits, and healthy oils and fats. What foods can I eat? Fruits Bananas. Apples. Oranges. Grapes. Papaya. Mango. Pomegranate. Kiwi. Grapefruit. Cherries. Vegetables Lettuce. Spinach. Peas. Beets. Cauliflower. Cabbage. Broccoli. Carrots. Tomatoes. Squash. Eggplant. Herbs. Peppers. Onions. Cucumbers. Brussels sprouts. Yams and sweet potatoes. Beans. Lentils. Grains Whole wheat or whole-grain foods, including breads, crackers, cereals, and pasta. Stone-ground whole wheat. Unsweetened oatmeal. Bulgur. Barley. Quinoa. Brown or wild rice. Corn or whole wheat flour tortillas. Meats and other proteins Lean meats. Poultry. Tofu. Seafood and shellfish. Dairy Low-fat or fat-free dairy products, such as yogurt, cottage cheese, or cheese. Beverages Water. Sugar-free drinks. Tea. Coffee. Low-fat or skim milk. Milk alternatives, such as soy or almond milk. Real fruit juice. Fats and oils Avocado. Canola or olive oil. Nuts and nut butters. Seeds. Seasonings and condiments Mustard. Relish. Low-fat, low-sugar ketchup and barbecue sauce. Low-fat or fat-free mayonnaise. Sweets and desserts Sugar-free sweets. The items listed above may not be  a complete list of foods and beverages you can eat. Contact a dietitian for more information. What foods should I limit or avoid? Meats and other proteins Limit red meat to 1-2 times a week. Dairy [employer]Full-fat dairy. Fats  and oils Palm oil and coconut oil. Fried foods. Other foods Processed foods. Foods that contain a lot of salt or sodium. Sweets and desserts Sweets that contain sugar. Beverages Sweetened drinks, such as sweet tea, milkshakes, iced sweet drinks, and sodas. Alcohol. The items listed above may not be a complete list of foods and beverages you should avoid. Contact a dietitian for more information. Where to find more information The [employer] of Diabetes and Digestive and Kidney Diseases: [employer].siniddk.nih.gov Summary  Nonalcoholic fatty liver disease is a condition that causes fat to build up in and around the liver.  Following a healthy diet can help to keep nonalcoholic fatty liver disease under control. Your diet should be rich in fruits, vegetables, whole grains, and lean proteins.  Limit your intake of saturated fat. Saturated fat is found in foods that come from animals, including meat and dairy products such as butter, cheese, and whole milk.  This diet promotes weight loss, helps to control blood sugar levels, and helps to improve the way that the body uses insulin. This information is not intended to replace advice given to you by your health care provider. Make sure you discuss any questions you have with your health care provider. Document Released: 12/08/2014 Document Revised: 11/15/2018 Document Reviewed: 08/15/2018 Elsevier Patient Education  2020 [employer]

## 2019-04-14 ENCOUNTER — Other Ambulatory Visit: Payer: Self-pay | Admitting: Family Medicine

## 2019-04-14 DIAGNOSIS — E032 Hypothyroidism due to medicaments and other exogenous substances: Secondary | ICD-10-CM

## 2019-04-21 ENCOUNTER — Other Ambulatory Visit: Payer: Self-pay | Admitting: Family Medicine

## 2019-04-21 NOTE — Telephone Encounter (Signed)
Requested medication (s) are due for refill today: yes  Requested medication (s) are on the active medication list: yes    Last refill: 02/28/2019  #30 0 refills  Future visit scheduled yes 05/02/2019  Notes to clinic:not delegated  Requested Prescriptions  Pending Prescriptions Disp Refills   tiZANidine (ZANAFLEX) 2 MG tablet [Pharmacy Med Name: TIZANIDINE HCL 2 MG TABLET] 30 tablet 0    Sig: Take 1 tablet (2 mg total) by mouth at bedtime. Instead of cyclobenzaprine     Not Delegated - Cardiovascular:  Alpha-2 Agonists - tizanidine Failed - 04/21/2019  6:03 PM      Failed - This refill cannot be delegated      Passed - Valid encounter within last 6 months    Recent Outpatient Visits          3 weeks ago Essential hypertension   [place], NP   2 months ago RUQ pain   [facility] Steele Sizer, MD   6 months ago Acute non-recurrent maxillary sinusitis   [place], FNP   6 months ago Osteoporosis, unspecified osteoporosis type, unspecified pathological fracture presence   [place], Satira Anis, MD   8 months ago Cough   [place], NP      Future Appointments            In 1 week Delsa Grana, PA-C [facility], [place]   In 3 months  [place] [facility], [facility]

## 2019-04-23 ENCOUNTER — Other Ambulatory Visit: Payer: Self-pay | Admitting: Family Medicine

## 2019-04-23 ENCOUNTER — Ambulatory Visit (INDEPENDENT_AMBULATORY_CARE_PROVIDER_SITE_OTHER): Payer: Medicare Other | Admitting: Gastroenterology

## 2019-04-23 ENCOUNTER — Encounter: Payer: Self-pay | Admitting: Gastroenterology

## 2019-04-23 ENCOUNTER — Other Ambulatory Visit: Payer: Self-pay

## 2019-04-23 VITALS — Temp 96.9°F | Ht 64.0 in | Wt 189.2 lb

## 2019-04-23 DIAGNOSIS — R110 Nausea: Secondary | ICD-10-CM

## 2019-04-23 NOTE — Progress Notes (Signed)
Gastroenterology Consultation  Referring Provider:     Steele Sizer, MD Primary Care Physician:  Steele Sizer, MD Primary Gastroenterologist:  Dr. Allen Norris     Reason for Consultation:     Right side abdominal pain with nausea        HPI:   Brittany Hill is a 68 y.o. y/o female referred for consultation & management of right side abdominal pain with nausea by Dr. Ancil Boozer, Drue Stager, MD.  This patient comes in today with a history of following Dr. Comer Locket in the room for gastroneurology for some time.  The patient had an EGD and colonoscopy back in August of last year.  The patient now reports that her nausea is only helping Zofran he states she had a work-up including a gallbladder emptying study that showed her gallbladder to be working at 66% but she does report feeling much is when she took the carbohydrate drink.  The patient denies any unexplained weight loss fevers chills black stools or bloody stools.  The patient does have a history of heartburn and there was concern about her taking a PPI and she was told by her previous gastroneurologist that she should continue it. The patient also has a history of polyps with last colonoscopy being negative but her previous colonoscopy showing adenomas.  The patient has also had a history of chronic heartburn and dysphasia in the past.  She reports that since she is on Prilosec her symptoms have decreased as far as her heartburn is concerned but not the right lower quadrant abdominal pain. The patient reports that she is seeing me today only because I am local and she was very happy with her previous gastroenterologist.  Past Medical History:  Diagnosis Date  . Allergy   . Anxiety   . Asthma    as a teenager  . Chronic constipation   . Complication of anesthesia   . Depression   . Dysrhythmia   . Fibromyalgia    Bells Palsy  . Full code status 08/02/2017   Patient wishes to have resuscitative efforts made if collapses; but does not want  to remain on life support or if condition is terminal -- see copy of living will  . Genetic testing 08/28/2017   Multi-Cancer panel (83 genes) @ [facility] - No pathogenic mutations detected  . GERD (gastroesophageal reflux disease)   . Heart murmur   . Hyperlipidemia   . Hypertension   . Hypothyroidism   . IFG (impaired fasting glucose)   . MS (multiple sclerosis) (HCC)    hx of long-term corticosteroid use  . OA (osteoarthritis)    osteoporosis  . Osteoporosis 05/2014   managed by Rheumatologist  . Pneumonia 2017  . PONV (postoperative nausea and vomiting)   . TMJ syndrome     Past Surgical History:  Procedure Laterality Date  . ABDOMINAL HYSTERECTOMY     complete at age 30; HRT for ~25y  . BACK SURGERY    . CATARACT EXTRACTION W/PHACO Left 09/27/2017   Procedure: CATARACT EXTRACTION PHACO AND INTRAOCULAR LENS PLACEMENT (IOC);  Surgeon: Eulogio Bear, MD;  Location: [facility] ORS;  Service: Ophthalmology;  Laterality: Left;  Lot #[id number] H [place]:   00:27.2 AP%:  13.7 CDE:  3.75  . CATARACT EXTRACTION W/PHACO Right 10/25/2017   Procedure: CATARACT EXTRACTION PHACO AND INTRAOCULAR LENS PLACEMENT (IOC);  Surgeon: Eulogio Bear, MD;  Location: [facility] ORS;  Service: Ophthalmology;  Laterality: Right;  Lot # [id number] H [place]:   00:23.9 AP%   :  8.7 CDE:   2.10  . Cataracts Bilateral    09/27/17 and 10/25/17  . COLONOSCOPY WITH ESOPHAGOGASTRODUODENOSCOPY (EGD)  12/2017   [facility]  . JOINT REPLACEMENT    . KNEE ARTHROSCOPY Right    x 2  . lumbarectomy with fusion  [id number]  . ROTATOR CUFF REPAIR    . tear duct      surgery  . TMJ ARTHROPLASTY    . TOTAL KNEE ARTHROPLASTY Bilateral 09/2007    Prior to Admission medications   Medication Sig Start Date End Date Taking? Authorizing Provider  calcium carbonate (CALCIUM 600) 600 MG TABS tablet Take 600 mg by mouth as needed.     [provider]  DULoxetine (CYMBALTA) 20 MG capsule Take 1 capsule (20 mg total) by  mouth daily. 03/28/19   Poulose, Percell BeltElizabeth E, NP  EPINEPHrine 0.3 mg/0.3 mL IJ SOAJ injection Inject 0.3 mg into the muscle once.     [provider]  ezetimibe-simvastatin (VYTORIN) 10-40 MG tablet Take 1 tablet by mouth at bedtime. 01/08/19   Poulose, Percell BeltElizabeth E, NP  fexofenadine (ALLEGRA) 180 MG tablet Take 180 mg by mouth daily as needed for allergies or rhinitis.    [provider]  fluticasone (FLONASE) 50 MCG/ACT nasal spray Place 2 sprays into both nostrils daily.    [provider]  meloxicam (MOBIC) 15 MG tablet Take 0.5-1 tablets (7.5-15 mg total) by mouth daily. 03/28/19   Poulose, Percell BeltElizabeth E, NP  metFORMIN (GLUCOPHAGE-XR) 500 MG 24 hr tablet Take 1 tablet (500 mg total) by mouth 2 (two) times daily. 11/07/18   Kerman PasseyLada, Melinda P, MD  metoprolol succinate (TOPROL-XL) 50 MG 24 hr tablet Take 1 tablet (50 mg total) by mouth at bedtime. Take with or immediately following a meal. 09/27/18   Lada, Janit BernMelinda P, MD  naproxen sodium (ALEVE) 220 MG tablet Take 220-440 mg by mouth See admin instructions. Take 220 mg by mouth in the morning and take 440 mg by mouth at bedtime    [provider]  omeprazole (PRILOSEC) 40 MG capsule Take 1 capsule (40 mg total) by mouth daily. 02/19/19   Alba CorySowles, Krichna, MD  spironolactone (ALDACTONE) 50 MG tablet Take 25 mg by mouth daily. With breakfast    Dalia HeadingFath, Kenneth A, MD  SYNTHROID 100 MCG tablet Take 1 tablet (100 mcg total) by mouth daily before breakfast. 04/15/19   Alba CorySowles, Krichna, MD  tiZANidine (ZANAFLEX) 2 MG tablet Take 1 tablet (2 mg total) by mouth at bedtime. Instead of cyclobenzaprine 02/28/19   Alba CorySowles, Krichna, MD  Vitamin D, Cholecalciferol, 50 MCG (2000 UT) CAPS Take 2,000 Units by mouth daily.     [provider]    Family History  Problem Relation Age of Onset  . Heart disease Mother   . Hypertension Mother   . Lymphoma Mother [id number]       deceased [phone]  . Stroke Father   . Heart attack Father   . Heart disease  Father   . Hypertension Father   . Alcohol abuse Father   . Hypertension Sister   . Colon cancer Sister 8051  . Hypertension Brother   . Diabetes Brother   . Mental illness Brother   . Depression Brother   . Lung cancer Brother [phone]       smoker; currently 5072  . Alcohol abuse Son   . Breast cancer Maternal Aunt 75       currently 7185  . Breast cancer Maternal Aunt  dx 50s; mets in 70s  . AAA (abdominal aortic aneurysm) Maternal Grandmother   . Heart disease Maternal Grandmother   . Heart attack Paternal Grandfather   . Throat cancer Paternal Uncle        smoker     Social History   Tobacco Use  . Smoking status: Never Smoker  . Smokeless tobacco: Never Used  Substance Use Topics  . Alcohol use: No    Alcohol/week: 0.0 standard drinks  . Drug use: No    Allergies as of 04/23/2019 - Review Complete 03/28/2019  Allergen Reaction Noted  . Shellfish allergy Rash and Swelling 12/02/2015  . Codeine Nausea And Vomiting 04/01/2015  . Sulfa antibiotics Swelling 04/01/2015    Review of Systems:    All systems reviewed and negative except where noted in HPI.   Physical Exam:  There were no vitals taken for this visit. No LMP recorded. Patient has had a hysterectomy. General:   Alert,  Well-developed, well-nourished, pleasant and cooperative in NAD Head:  Normocephalic and atraumatic. Eyes:  Sclera clear, no icterus.   Conjunctiva pink. Ears:  Normal auditory acuity. Nose:  No deformity, discharge, or lesions. Mouth:  No deformity or lesions,oropharynx pink & moist. Neck:  Supple; no masses or thyromegaly. Lungs:  Respirations even and unlabored.  Clear throughout to auscultation.   No wheezes, crackles, or rhonchi. No acute distress. Heart:  Regular rate and rhythm; no murmurs, clicks, rubs, or gallops. Abdomen:  Normal bowel sounds.  No bruits.  Soft, positive tenderness to finger palpation while flexing the abdominal wall muscles in the right lower quadrant and  non-distended without masses, hepatosplenomegaly or hernias noted.  No guarding or rebound tenderness.  Positive Carnett sign.   Rectal:  Deferred.  Msk:  Symmetrical without gross deformities.  Good, equal movement & strength bilaterally. Pulses:  Normal pulses noted. Extremities:  No clubbing or edema.  No cyanosis. Neurologic:  Alert and oriented x3;  grossly normal neurologically. Skin:  Intact without significant lesions or rashes.  No jaundice. Lymph Nodes:  No significant cervical adenopathy. Psych:  Alert and cooperative. Normal mood and affect.  Imaging Studies: No results found.  Assessment and Plan:   Brittany Hill is a 68 y.o. y/o female who comes in today with abdominal quadrant that is clearly musculoskeletal.  The patient's nausea has been chronic and she is being treated with Zofran.  The patient can have nausea from many possible causes being both GI and non-GI in nature.  If the symptoms are being caused by reflux then her medication may not be completely eliminating the acid problem.  She will be started on a trial of Dexilant 60 mg once a day for 2 weeks.  If her symptoms do not improve she has been told to contact me for further instructions.  Is also been informed that the right lower quadrant pain is muscular in nature.  The patient has been explained the plan and agrees with it.  Midge Minium, MD. Clementeen Graham    Note: This dictation was prepared with Dragon dictation along with smaller phrase technology. Any transcriptional errors that result from this process are unintentional.

## 2019-04-25 ENCOUNTER — Other Ambulatory Visit: Payer: Self-pay

## 2019-04-25 DIAGNOSIS — M542 Cervicalgia: Secondary | ICD-10-CM

## 2019-04-25 MED ORDER — MELOXICAM 15 MG PO TABS: 7.5000 mg | ORAL_TABLET | Freq: Every day | ORAL | 0 refills | Status: DC

## 2019-05-02 ENCOUNTER — Encounter: Payer: Self-pay | Admitting: Family Medicine

## 2019-05-02 ENCOUNTER — Ambulatory Visit: Payer: Medicare Other | Admitting: Family Medicine

## 2019-05-02 ENCOUNTER — Other Ambulatory Visit: Payer: Self-pay

## 2019-05-02 VITALS — BP 124/72 | HR 87 | Temp 97.6°F | Resp 14 | Ht 64.0 in | Wt 183.7 lb

## 2019-05-02 DIAGNOSIS — M542 Cervicalgia: Secondary | ICD-10-CM

## 2019-05-02 DIAGNOSIS — M797 Fibromyalgia: Secondary | ICD-10-CM

## 2019-05-02 DIAGNOSIS — Z23 Encounter for immunization: Secondary | ICD-10-CM | POA: Diagnosis not present

## 2019-05-02 DIAGNOSIS — F3342 Major depressive disorder, recurrent, in full remission: Secondary | ICD-10-CM

## 2019-05-02 MED ORDER — MELOXICAM 15 MG PO TABS: 7.5000 mg | ORAL_TABLET | Freq: Every day | ORAL | 0 refills | Status: DC

## 2019-05-02 MED ORDER — DULOXETINE HCL 20 MG PO CPEP: 40.0000 mg | ORAL_CAPSULE | Freq: Every day | ORAL | 3 refills | Status: DC

## 2019-05-02 MED ORDER — PREGABALIN 25 MG PO CAPS: 25.0000 mg | ORAL_CAPSULE | Freq: Two times a day (BID) | ORAL | 2 refills | Status: DC

## 2019-05-02 MED ORDER — DICLOFENAC SODIUM 1 % TD GEL: 2.0000 g | Freq: Four times a day (QID) | TRANSDERMAL | 1 refills | Status: DC

## 2019-05-02 NOTE — Patient Instructions (Signed)
Lets keep cymbalta at 40 mg.  Please let me know if you feel a dose increase for your moods.    Try lyrica 25 mg twice a day for your pain.  If it makes you too tired just do the night time dose.  Let us know if your specialist will manage or if you need [place] to refills lyrica every month.

## 2019-05-02 NOTE — Progress Notes (Signed)
Patient ID: Donavan Foilhelma K Herbold, female    DOB: 10/18/1950, 68 y.o.   MRN: [id number]  PCP: Alba CorySowles, Krichna, MD  Chief Complaint  Patient presents with  . Follow-up    antidepresant medication changed    Subjective:   Donavan Foilhelma K Oyervides is a 68 y.o. female, presents to clinic with CC of the following:  HPI   Patient here for follow-up on med changes about one month ago, she did a cross taper coming off of Zoloft and getting onto Cymbalta started out with 20 mg Cymbalta is currently on 40 mg daily and reports that she is feeling so much better she is no longer crying all the time, she feels like her mood is pretty good she is not having anxiety or panic attacks.  She inquires about increasing to 60 mg for her refill or staying the same.  Determined that since she is improved so much will remain at 40 mg dosing and adjust if she feels she needs to increase later.  Recent Visit with Sharyon CableElizabeth Poulose NP HPI copied below: Anxiety & Depression  She is taking zoloft 100mg  daily- states this helps her but feels she needs more has increased dose in the past without further improvement. Came off of it in the past and was crying all the time. She has additionally tried prozac in the past.  [facility] 05/02/2019 03/28/2019 02/19/2019  Decreased Interest 0 1 1  Down, Depressed, Hopeless 1 3 1   PHQ - 2 Score 1 4 2   Altered sleeping 2 3 2   Tired, decreased energy 1 3 2   Change in appetite 1 0 1  Feeling bad or failure about yourself  0 0 0  Trouble concentrating 0 1 0  Moving slowly or fidgety/restless 0 0 0  Suicidal thoughts 0 0 0  PHQ-9 Score 5 11 7   Difficult doing work/chores Not difficult at all Somewhat difficult Not difficult at all  Some recent data might be hidden  PHQ 9 - positive today, but improved from last visit  Neck pain -  She complains of acutely worsening neck pain, has long history of degenerative disc disease and stenosis and cervical radiculopathy she is  [facility] and did recently have an injection in her neck and it helped for a few days but she is having severe pain is waking her up at night, and PCP at this office was giving her Mobic she is inquiring today if she should go back on Mobic or stop it and take Aleve or naproxen.  She was taking Mobic 15 mg every night at bedtime.  She states that over the last couple days pain to neck and right arm have worsened, encouraged her to f/up with specialist about mangement, pain currently 4/10 and every night pain wakes her up at night at about 2-3 am.  She does have a brace for her right arm and she also sleeps with a heating pad. She does have chronic pain all day as well, has been prescribed oxycodone from specialists - it only helps for an hour or two and she feels awful and nauseous.  She did try lyrica in the past and she did well with it and it helped about the time of a prior back surgeon.  She would like to try Lyrica again.  She does not take narcotic pain medicine very frequently because of all of the side effects  Patient Active Problem List   Diagnosis Date  Noted  . Fatty liver disease, nonalcoholic 03/28/2019  . Osteopenia 10/20/2018  . Obesity (BMI 30.0-34.9) 09/27/2018  . Cataract, left 04/02/2018  . Osteoarthritis of left ankle 02/16/2018  . Asthma without status asthmaticus 10/05/2017  . Genetic testing 08/28/2017  . Family hx-breast malignancy 08/02/2017  . Dense breast tissue on mammogram 07/28/2016  . Left sided sciatica 12/19/2015  . Left lumbar radiculopathy 12/16/2015  . Hypomagnesemia 12/06/2015  . Hypokalemia 12/02/2015  . Fibromyalgia   . MS (multiple sclerosis) (HCC)   . Chronic constipation   . OA (osteoarthritis)   . Hypothyroidism   . IFG (impaired fasting glucose)   . Hypertension   . Hyperlipidemia   . GERD (gastroesophageal reflux disease)   . MDD (major depressive disorder), recurrent, in full remission (HCC)   . Anxiety   . TMJ  syndrome       Current Outpatient Medications:  .  calcium carbonate (CALCIUM 600) 600 MG TABS tablet, Take 600 mg by mouth as needed. , Disp: , Rfl:  .  DULoxetine (CYMBALTA) 20 MG capsule, Take 1 capsule (20 mg total) by mouth daily., Disp: 90 capsule, Rfl: 1 .  EPINEPHrine 0.3 mg/0.3 mL IJ SOAJ injection, Inject 0.3 mg into the muscle once. , Disp: , Rfl:  .  ezetimibe-simvastatin (VYTORIN) 10-40 MG tablet, Take 1 tablet by mouth at bedtime., Disp: 90 tablet, Rfl: 0 .  fexofenadine (ALLEGRA) 180 MG tablet, Take 180 mg by mouth daily as needed for allergies or rhinitis., Disp: , Rfl:  .  fluticasone (FLONASE) 50 MCG/ACT nasal spray, Place 2 sprays into both nostrils daily., Disp: , Rfl:  .  meloxicam (MOBIC) 15 MG tablet, Take 0.5-1 tablets (7.5-15 mg total) by mouth daily., Disp: 30 tablet, Rfl: 0 .  metFORMIN (GLUCOPHAGE-XR) 500 MG 24 hr tablet, Take 1 tablet (500 mg total) by mouth 2 (two) times daily., Disp: 180 tablet, Rfl: 1 .  metoprolol succinate (TOPROL-XL) 50 MG 24 hr tablet, Take 1 tablet (50 mg total) by mouth at bedtime. Take with or immediately following a meal., Disp: , Rfl:  .  naproxen sodium (ALEVE) 220 MG tablet, Take 220-440 mg by mouth See admin instructions. Take 220 mg by mouth in the morning and take 440 mg by mouth at bedtime, Disp: , Rfl:  .  omeprazole (PRILOSEC) 40 MG capsule, Take 1 capsule (40 mg total) by mouth daily., Disp: 90 capsule, Rfl: 0 .  oxyCODONE-acetaminophen (PERCOCET/ROXICET) 5-325 MG tablet, Take by mouth., Disp: , Rfl:  .  spironolactone (ALDACTONE) 50 MG tablet, Take 25 mg by mouth daily. With breakfast, Disp: , Rfl:  .  SYNTHROID 100 MCG tablet, Take 1 tablet (100 mcg total) by mouth daily before breakfast., Disp: 30 tablet, Rfl: 0 .  tiZANidine (ZANAFLEX) 2 MG tablet, Take 1 tablet (2 mg total) by mouth at bedtime. Instead of cyclobenzaprine, Disp: 30 tablet, Rfl: 0 .  Vitamin D, Cholecalciferol, 50 MCG (2000 UT) CAPS, Take 2,000 Units by mouth  daily. , Disp: , Rfl:    Allergies  Allergen Reactions  . Shellfish Allergy Rash and Swelling  . Codeine Nausea And Vomiting  . Sulfa Antibiotics Swelling     Family History  Problem Relation Age of Onset  . Heart disease Mother   . Hypertension Mother   . Lymphoma Mother 2780       deceased [phone]  . Stroke Father   . Heart attack Father   . Heart disease Father   . Hypertension Father   .  Alcohol abuse Father   . Hypertension Sister   . Colon cancer Sister 18  . Hypertension Brother   . Diabetes Brother   . Mental illness Brother   . Depression Brother   . Lung cancer Brother 56       smoker; currently 32  . Alcohol abuse Son   . Breast cancer Maternal Aunt 75       currently 62  . Breast cancer Maternal Aunt        dx 69s; mets in 64s  . AAA (abdominal aortic aneurysm) Maternal Grandmother   . Heart disease Maternal Grandmother   . Heart attack Paternal Grandfather   . Throat cancer Paternal Uncle        smoker     Social History   Socioeconomic History  . Marital status: Married    Spouse name: Dorene Sorrow  . Number of children: 2  . Years of education: Not on file  . Highest education level: Master's degree (e.g., MA, MS, MEng, MEd, MSW, MBA)  Occupational History  . Occupation: retired  Engineer, production  . Financial resource strain: Not hard at all  . Food insecurity    Worry: Never true    Inability: Never true  . Transportation needs    Medical: No    Non-medical: No  Tobacco Use  . Smoking status: Never Smoker  . Smokeless tobacco: Never Used  Substance and Sexual Activity  . Alcohol use: No    Alcohol/week: 0.0 standard drinks  . Drug use: No  . Sexual activity: Not Currently  Lifestyle  . Physical activity    Days per week: 2 days    Minutes per session: 60 min  . Stress: To some extent  Relationships  . Social connections    Talks on phone: More than three times a week    Gets together: More than three times a week    Attends religious  service: More than 4 times per year    Active member of club or organization: No    Attends meetings of clubs or organizations: Never    Relationship status: Married  . Intimate partner violence    Fear of current or ex partner: No    Emotionally abused: No    Physically abused: No    Forced sexual activity: No  Other Topics Concern  . Not on file  Social History Narrative  . Not on file    I personally reviewed active problem list, medication list, allergies, notes from last encounter, lab results with the patient/caregiver today.  Review of Systems  Constitutional: Negative.   HENT: Negative.   Eyes: Negative.   Respiratory: Negative.   Cardiovascular: Negative.   Gastrointestinal: Negative.   Endocrine: Negative.   Genitourinary: Negative.   Musculoskeletal: Negative.   Skin: Negative.   Allergic/Immunologic: Negative.   Neurological: Negative.   Hematological: Negative.   Psychiatric/Behavioral: Negative.   All other systems reviewed and are negative.      Objective:   Vitals:   05/02/19 0907  BP: 124/72  Pulse: 87  Resp: 14  Temp: 97.6 F (36.4 C)  TempSrc: Oral  SpO2: 97%  Weight: 183 lb 11.2 oz (83.3 kg)  Height: 5\' 4"  (1.626 m)    Body mass index is 31.53 kg/m.  Physical Exam Vitals signs and nursing note reviewed.  Constitutional:      General: She is not in acute distress.    Appearance: Normal appearance. She is well-developed. She is not ill-appearing, toxic-appearing  or diaphoretic.  HENT:     Head: Normocephalic and atraumatic.     Nose: Nose normal.  Eyes:     General:        Right eye: No discharge.        Left eye: No discharge.     Conjunctiva/sclera: Conjunctivae normal.  Neck:     Trachea: No tracheal deviation.  Cardiovascular:     Rate and Rhythm: Normal rate and regular rhythm.     Pulses: Normal pulses.     Heart sounds: Normal heart sounds.  Pulmonary:     Effort: Pulmonary effort is normal. No respiratory distress.      Breath sounds: Normal breath sounds. No stridor.  Musculoskeletal: Normal range of motion.  Skin:    General: Skin is warm and dry.     Coloration: Skin is not jaundiced or pale.     Findings: No rash.  Neurological:     Mental Status: She is alert.     Motor: Weakness (4/5 grip strength in right arm, 5/5 in left) present. No abnormal muscle tone.  Psychiatric:        Mood and Affect: Mood normal.        Behavior: Behavior normal.      Results for orders placed or performed in visit on 02/19/19  COMPLETE METABOLIC PANEL WITH GFR  Result Value Ref Range   Glucose, Bld 96 65 - 99 mg/dL   BUN 14 7 - 25 mg/dL   Creat 7.98 9.21 - 1.94 mg/dL   GFR, Est Non African American 76 > OR = 60 mL/min/1.49m2   GFR, Est African American 88 > OR = 60 mL/min/1.96m2   BUN/Creatinine Ratio NOT APPLICABLE 6 - 22 (calc)   Sodium 137 135 - 146 mmol/L   Potassium 4.4 3.5 - 5.3 mmol/L   Chloride 102 98 - 110 mmol/L   CO2 24 20 - 32 mmol/L   Calcium 10.3 8.6 - 10.4 mg/dL   Total Protein 7.5 6.1 - 8.1 g/dL   Albumin 4.8 3.6 - 5.1 g/dL   Globulin 2.7 1.9 - 3.7 g/dL (calc)   AG Ratio 1.8 1.0 - 2.5 (calc)   Total Bilirubin 0.3 0.2 - 1.2 mg/dL   Alkaline phosphatase (APISO) 33 (L) 37 - 153 U/L   AST 20 10 - 35 U/L   ALT 19 6 - 29 U/L  CBC with Differential/Platelet  Result Value Ref Range   WBC 7.2 3.8 - 10.8 Thousand/uL   RBC 4.48 3.80 - 5.10 Million/uL   Hemoglobin 13.5 11.7 - 15.5 g/dL   HCT 17.4 08.1 - 44.8 %   MCV 88.2 80.0 - 100.0 fL   MCH 30.1 27.0 - 33.0 pg   MCHC 34.2 32.0 - 36.0 g/dL   RDW 18.5 63.1 - 49.7 %   Platelets 227 140 - 400 Thousand/uL   MPV 10.4 7.5 - 12.5 fL   Neutro Abs 4,428 1,500 - 7,800 cells/uL   Lymphs Abs 1,872 850 - 3,900 cells/uL   Absolute Monocytes 655 200 - 950 cells/uL   Eosinophils Absolute 202 15 - 500 cells/uL   Basophils Absolute 43 0 - 200 cells/uL   Neutrophils Relative % 61.5 %   Total Lymphocyte 26.0 %   Monocytes Relative 9.1 %   Eosinophils  Relative 2.8 %   Basophils Relative 0.6 %  H. pylori breath test  Result Value Ref Range   H. pylori Breath Test NOT DETECTED NOT DETECT  SPECIMEN COMPROMISED  Result Value  Ref Range   Specimen Integrity          Assessment & Plan:      ICD-10-CM   1. MDD (major depressive disorder), recurrent, in full remission ([place])  F33.42 DULoxetine (CYMBALTA) 20 MG capsule   Improved with med changes continue Cymbalta 40 mg daily  2. Neck pain  M54.2 meloxicam (MOBIC) 15 MG tablet    diclofenac sodium (VOLTAREN) 1 % GEL    pregabalin (LYRICA) 25 MG capsule   f/up with [facility] specialist - lyrica rx'd for her acute on chronic muscle skeletal pain also diagnosis of fibromyalgia but will help with sleep  3. Fibromyalgia  M79.7 pregabalin (LYRICA) 25 MG capsule   See above  4. Need for influenza vaccination  Z23 Flu Vaccine QUAD High Dose(Fluad)   Flu shot done today        Delsa Grana, PA-C 05/02/19 9:15 AM

## 2019-05-14 DIAGNOSIS — M5020 Other cervical disc displacement, unspecified cervical region: Secondary | ICD-10-CM | POA: Insufficient documentation

## 2019-05-20 ENCOUNTER — Other Ambulatory Visit: Payer: Self-pay | Admitting: Family Medicine

## 2019-05-20 DIAGNOSIS — R7303 Prediabetes: Secondary | ICD-10-CM

## 2019-05-20 NOTE — Telephone Encounter (Signed)
Medication Refill - Medication: metFORMIN (GLUCOPHAGE-XR) 500 MG 24 hr tablet    Has the patient contacted their pharmacy? Yes.   (Agent: If no, request that the patient contact the pharmacy for the refill.) (Agent: If yes, when and what did the pharmacy advise?)  Preferred Pharmacy (with phone number or street name):  [place], [place] [phone] (Phone) [phone] (Fax)     Agent: Please be advised that RX refills may take up to 3 business days. We ask that you follow-up with your pharmacy.

## 2019-05-21 MED ORDER — METFORMIN HCL ER 500 MG PO TB24: 500.0000 mg | ORAL_TABLET | Freq: Two times a day (BID) | ORAL | 1 refills | Status: DC

## 2019-06-23 ENCOUNTER — Other Ambulatory Visit: Payer: Self-pay | Admitting: Family Medicine

## 2019-06-26 ENCOUNTER — Other Ambulatory Visit: Payer: Self-pay | Admitting: Family Medicine

## 2019-06-26 ENCOUNTER — Other Ambulatory Visit: Payer: Self-pay

## 2019-06-26 DIAGNOSIS — R1013 Epigastric pain: Secondary | ICD-10-CM

## 2019-06-26 DIAGNOSIS — M542 Cervicalgia: Secondary | ICD-10-CM

## 2019-06-27 ENCOUNTER — Other Ambulatory Visit: Payer: Self-pay | Admitting: Family Medicine

## 2019-06-27 DIAGNOSIS — M542 Cervicalgia: Secondary | ICD-10-CM

## 2019-06-27 DIAGNOSIS — M797 Fibromyalgia: Secondary | ICD-10-CM

## 2019-06-27 MED ORDER — MELOXICAM 15 MG PO TABS: 7.5000 mg | ORAL_TABLET | Freq: Every day | ORAL | 0 refills | Status: DC

## 2019-06-27 NOTE — Telephone Encounter (Signed)
pregabalin (LYRICA) 25 MG capsule    Patient is requesting a refill. She was advised by Dr. Ashley Mariner at [facility] to take this medication x2 a night and x1 in the morning. She is requesting this be changed by her PCP.    Pharmacy:  [place], [place] [phone] (Phone) [phone] (Fax)

## 2019-06-30 MED ORDER — PREGABALIN 25 MG PO CAPS: ORAL_CAPSULE | ORAL | 0 refills | Status: DC

## 2019-06-30 NOTE — Telephone Encounter (Signed)
New directions by another dr. See below?

## 2019-07-09 ENCOUNTER — Encounter: Payer: Self-pay | Admitting: Family Medicine

## 2019-07-09 ENCOUNTER — Other Ambulatory Visit: Payer: Self-pay

## 2019-07-09 ENCOUNTER — Ambulatory Visit (INDEPENDENT_AMBULATORY_CARE_PROVIDER_SITE_OTHER): Payer: Medicare Other | Admitting: Family Medicine

## 2019-07-09 VITALS — BP 120/79 | HR 80 | Ht 64.0 in | Wt 183.0 lb

## 2019-07-09 DIAGNOSIS — G8928 Other chronic postprocedural pain: Secondary | ICD-10-CM

## 2019-07-09 DIAGNOSIS — E782 Mixed hyperlipidemia: Secondary | ICD-10-CM

## 2019-07-09 DIAGNOSIS — E119 Type 2 diabetes mellitus without complications: Secondary | ICD-10-CM | POA: Diagnosis not present

## 2019-07-09 DIAGNOSIS — M5412 Radiculopathy, cervical region: Secondary | ICD-10-CM

## 2019-07-09 DIAGNOSIS — M810 Age-related osteoporosis without current pathological fracture: Secondary | ICD-10-CM

## 2019-07-09 DIAGNOSIS — F3342 Major depressive disorder, recurrent, in full remission: Secondary | ICD-10-CM

## 2019-07-09 DIAGNOSIS — E039 Hypothyroidism, unspecified: Secondary | ICD-10-CM

## 2019-07-09 DIAGNOSIS — I10 Essential (primary) hypertension: Secondary | ICD-10-CM

## 2019-07-09 DIAGNOSIS — E032 Hypothyroidism due to medicaments and other exogenous substances: Secondary | ICD-10-CM

## 2019-07-09 DIAGNOSIS — G8929 Other chronic pain: Secondary | ICD-10-CM

## 2019-07-09 DIAGNOSIS — M549 Dorsalgia, unspecified: Secondary | ICD-10-CM

## 2019-07-09 DIAGNOSIS — M797 Fibromyalgia: Secondary | ICD-10-CM

## 2019-07-09 MED ORDER — LEVOTHYROXINE SODIUM 100 MCG PO TABS: 100.0000 ug | ORAL_TABLET | Freq: Every day | ORAL | 1 refills | Status: DC

## 2019-07-09 MED ORDER — TIZANIDINE HCL 4 MG PO TABS: 4.0000 mg | ORAL_TABLET | Freq: Three times a day (TID) | ORAL | 2 refills | Status: DC | PRN

## 2019-07-09 MED ORDER — EZETIMIBE-SIMVASTATIN 10-40 MG PO TABS: 1.0000 | ORAL_TABLET | Freq: Every day | ORAL | 1 refills | Status: DC

## 2019-07-09 MED ORDER — PREGABALIN 25 MG PO CAPS: ORAL_CAPSULE | ORAL | 0 refills | Status: DC

## 2019-07-09 NOTE — Progress Notes (Signed)
Name: Brittany Hill   MRN: [id number]    DOB: 07-09-1951   Date:07/09/2019       Progress Note  Subjective:    Chief Complaint  Chief Complaint  Patient presents with  . Follow-up  . Neck Pain    had recent surgery, having pain since able to do more now.  She was told to up lyrica which you have already done so.  Also increase muscle relaxer dose and they want pcp to do so?    I connected with  Brittany Hill  on 07/09/19 at 11:40 AM EST by a video enabled telemedicine application and verified that I am speaking with the correct person using two identifiers.  I discussed the limitations of evaluation and management by telemedicine and the availability of in person appointments. The patient expressed understanding and agreed to proceed. Staff also discussed with the patient that there may be a patient responsible charge related to this service. Patient Location: [facility] parking lot Provider Location: [facility] clinic Additional Individuals present: none  HPI  Neck pain/cervical DDD/post op pain/fibromyalgia: Pt with complicated neck ddd/surgery and pain, recent surgery May 14, 2019 with [facility] specialists. Per specialists they recommend managing pain with PCP for lyrica and muscle relaxer titration/dose increase.   Also on cymbalta 40 mg which she feels she is doing really well with Lyrica 50 mg po bedtime 25 mg in am - started this dose about 1-2 weeks ago.  Refills were already sent to the pharmacy but even at this increased dose she is still having severe nerve pain especially after her last follow-up visit.   11/20 was her last visit f/up with her specialist - did 6 weeks rest and then at that visit she was given a large handout of exercises to do at home.  As she is attempted to do these she had severe muscle spasms neck pain that radiates to her arms even feels like shooting burning pain through her fingernails.  She said that her specialist already discharged her and he just said to call  them if in 6 more weeks she still having severe pain then he will order physical therapy for her. She does feel that she needs to increase the Lyrica dose and needs to increase the muscle relaxer dose.  Lyrica no longer makes her tired at all she is currently taking 50 mg at night and 25 mg in the morning.  Her muscle relaxers tizanidine 2 mg she has been on Flexeril 10 mg 3 times a day for a long time before and she does feel like after doing her home exercises she needs a much higher dose of muscle relaxer.  Is also taking Mobic at night   DM - metformin 500 mg BID - no SE or concerns, CBGs - she doesn't check. Lab Results  Component Value Date   HGBA1C 6.1 (H) 10/11/2018  She states that she has had at diagnosis of prediabetes for like 20 years and was never told that she had diabetes.   HTN:   Pt states her BP is well controlled, but she is trying to work on lifestyle changes and weight loss she is hoping to decrease her medicines and get off of them she is currently on Spironolactone 50 mg, which she states is managed by Dr. Lady Gary, her cardiologist.  She recently went to the ER with chest pain and did office visit with him and she does have follow-up in the next couple months.  Hypothyroid 100  mcg dose daily Takes medicine daily in the am Current Symptoms: denies fatigue, weight changes, heat/cold intolerance, bowel/skin changes or CVS symptoms Most recent results are below; we will be repeating labs today. Lab Results  Component Value Date   TSH 1.86 10/11/2018     Patient Active Problem List   Diagnosis Date Noted  . Fatty liver disease, nonalcoholic 92/06/9416  . Osteopenia 10/20/2018  . Obesity (BMI 30.0-34.9) 09/27/2018  . Cataract, left 04/02/2018  . Osteoarthritis of left ankle 02/16/2018  . Asthma without status asthmaticus 10/05/2017  . Genetic testing 08/28/2017  . Family hx-breast malignancy 08/02/2017  . Dense breast tissue on mammogram 07/28/2016  . Left sided  sciatica 12/19/2015  . Left lumbar radiculopathy 12/16/2015  . Hypomagnesemia 12/06/2015  . Hypokalemia 12/02/2015  . Fibromyalgia   . MS (multiple sclerosis) ([place])   . Chronic constipation   . OA (osteoarthritis)   . Hypothyroidism   . IFG (impaired fasting glucose)   . Hypertension   . Hyperlipidemia   . GERD (gastroesophageal reflux disease)   . MDD (major depressive disorder), recurrent, in full remission ([place])   . Anxiety   . TMJ syndrome     Past Surgical History:  Procedure Laterality Date  . ABDOMINAL HYSTERECTOMY     complete at age 78; HRT for ~25y  . BACK SURGERY    . CATARACT EXTRACTION W/PHACO Left 09/27/2017   Procedure: CATARACT EXTRACTION PHACO AND INTRAOCULAR LENS PLACEMENT (IOC);  Surgeon: Eulogio Bear, MD;  Location: [facility] ORS;  Service: Ophthalmology;  Laterality: Left;  Lot #[id number] H [place]:   00:27.2 AP%:  13.7 CDE:  3.75  . CATARACT EXTRACTION W/PHACO Right 10/25/2017   Procedure: CATARACT EXTRACTION PHACO AND INTRAOCULAR LENS PLACEMENT (IOC);  Surgeon: Eulogio Bear, MD;  Location: [facility] ORS;  Service: Ophthalmology;  Laterality: Right;  Lot # [id number] H [place]:   00:23.9 AP%   :8.7 CDE:   2.10  . Cataracts Bilateral    09/27/17 and 10/25/17  . COLONOSCOPY WITH ESOPHAGOGASTRODUODENOSCOPY (EGD)  12/2017   [facility] [place] ARTHROSCOPY Right    x 2  . lumbarectomy with fusion  [id number]  . ROTATOR CUFF REPAIR    . tear duct      surgery  . TMJ ARTHROPLASTY    . TOTAL KNEE ARTHROPLASTY Bilateral 09/2007    Family History  Problem Relation Age of Onset  . Heart disease Mother   . Hypertension Mother   . Lymphoma Mother 57       deceased 66  . Stroke Father   . Heart attack Father   . Heart disease Father   . Hypertension Father   . Alcohol abuse Father   . Hypertension Sister   . Colon cancer Sister 46  . Hypertension Brother   . Diabetes Brother   . Mental illness Brother   . Depression Brother   .  Lung cancer Brother 81       smoker; currently 9  . Alcohol abuse Son   . Breast cancer Maternal Aunt 75       currently 40  . Breast cancer Maternal Aunt        dx 81s; mets in 66s  . AAA (abdominal aortic aneurysm) Maternal Grandmother   . Heart disease Maternal Grandmother   . Heart attack Paternal Grandfather   . Throat cancer Paternal Uncle        smoker    Social History   Socioeconomic  History  . Marital status: Married    Spouse name: Dorene SorrowJerry  . Number of children: 2  . Years of education: Not on file  . Highest education level: Master's degree (e.g., MA, MS, MEng, MEd, MSW, MBA)  Occupational History  . Occupation: retired  Engineer, productionocial Needs  . Financial resource strain: Not hard at all  . Food insecurity    Worry: Never true    Inability: Never true  . Transportation needs    Medical: No    Non-medical: No  Tobacco Use  . Smoking status: Never Smoker  . Smokeless tobacco: Never Used  Substance and Sexual Activity  . Alcohol use: No    Alcohol/week: 0.0 standard drinks  . Drug use: No  . Sexual activity: Not Currently  Lifestyle  . Physical activity    Days per week: 2 days    Minutes per session: 60 min  . Stress: To some extent  Relationships  . Social connections    Talks on phone: More than three times a week    Gets together: More than three times a week    Attends religious service: More than 4 times per year    Active member of club or organization: No    Attends meetings of clubs or organizations: Never    Relationship status: Married  . Intimate partner violence    Fear of current or ex partner: No    Emotionally abused: No    Physically abused: No    Forced sexual activity: No  Other Topics Concern  . Not on file  Social History Narrative  . Not on file     Current Outpatient Medications:  .  calcium carbonate (CALCIUM 600) 600 MG TABS tablet, Take 600 mg by mouth as needed. , Disp: , Rfl:  .  DULoxetine (CYMBALTA) 20 MG capsule, Take 2  capsules (40 mg total) by mouth daily., Disp: 180 capsule, Rfl: 3 .  EPINEPHrine 0.3 mg/0.3 mL IJ SOAJ injection, Inject 0.3 mg into the muscle once. , Disp: , Rfl:  .  ezetimibe-simvastatin (VYTORIN) 10-40 MG tablet, Take 1 tablet by mouth at bedtime., Disp: 90 tablet, Rfl: 0 .  fluticasone (FLONASE) 50 MCG/ACT nasal spray, Place 2 sprays into both nostrils daily., Disp: , Rfl:  .  meloxicam (MOBIC) 15 MG tablet, Take 0.5-1 tablets (7.5-15 mg total) by mouth daily., Disp: 30 tablet, Rfl: 0 .  metFORMIN (GLUCOPHAGE-XR) 500 MG 24 hr tablet, Take 1 tablet (500 mg total) by mouth 2 (two) times daily., Disp: 180 tablet, Rfl: 1 .  metoprolol succinate (TOPROL-XL) 50 MG 24 hr tablet, Take 1 tablet (50 mg total) by mouth at bedtime. Take with or immediately following a meal., Disp: , Rfl:  .  naproxen sodium (ALEVE) 220 MG tablet, Take 220-440 mg by mouth See admin instructions. Take 220 mg by mouth in the morning and take 440 mg by mouth at bedtime, Disp: , Rfl:  .  omeprazole (PRILOSEC) 40 MG capsule, Take 1 capsule (40 mg total) by mouth daily., Disp: 90 capsule, Rfl: 0 .  pregabalin (LYRICA) 25 MG capsule, Take 50 mg PO at bedtime, and 25 mg PO qam daily, Disp: 90 capsule, Rfl: 0 .  spironolactone (ALDACTONE) 50 MG tablet, Take 25 mg by mouth daily. With breakfast, Disp: , Rfl:  .  SYNTHROID 100 MCG tablet, Take 1 tablet (100 mcg total) by mouth daily before breakfast., Disp: 30 tablet, Rfl: 0 .  tiZANidine (ZANAFLEX) 2 MG tablet, Take 1 tablet (  2 mg total) by mouth at bedtime. Instead of cyclobenzaprine, Disp: 30 tablet, Rfl: 0 .  Vitamin D, Cholecalciferol, 50 MCG (2000 UT) CAPS, Take 2,000 Units by mouth daily. , Disp: , Rfl:   Allergies  Allergen Reactions  . Shellfish Allergy Rash and Swelling  . Codeine Nausea And Vomiting  . Sulfa Antibiotics Swelling    I personally reviewed active problem list, medication list, allergies, family history, social history, health maintenance, notes from last  encounter, lab results, imaging with the patient/caregiver today.  Review of Systems  Constitutional: Negative.   HENT: Negative.   Eyes: Negative.   Respiratory: Negative.   Cardiovascular: Negative.   Gastrointestinal: Negative.   Endocrine: Negative.   Genitourinary: Negative.   Musculoskeletal: Negative.   Skin: Negative.   Allergic/Immunologic: Negative.   Neurological: Negative.   Hematological: Negative.   Psychiatric/Behavioral: Negative.   All other systems reviewed and are negative.     Objective:    Virtual encounter, vitals limited, only able to obtain the following Today's Vitals   07/09/19 1123 07/09/19 1124  BP: 120/79   Pulse: 80   Weight: 183 lb (83 kg)   Height: 5\' 4"  (1.626 m)   PainSc:  4    Body mass index is 31.41 kg/m. Nursing Note and Vital Signs reviewed.  Physical Exam Vitals signs and nursing note reviewed.  Constitutional:      General: She is not in acute distress.    Appearance: She is well-developed. She is obese. She is not ill-appearing, toxic-appearing or diaphoretic.  HENT:     Head: Normocephalic and atraumatic.     Nose: Nose normal.  Eyes:     General:        Right eye: No discharge.        Left eye: No discharge.     Conjunctiva/sclera: Conjunctivae normal.  Neck:     Musculoskeletal: Neck rigidity present.     Trachea: No tracheal deviation.     Comments: Normal phonation Pulmonary:     Effort: Pulmonary effort is normal. No respiratory distress.     Breath sounds: No stridor.  Musculoskeletal: Normal range of motion.  Skin:    Coloration: Skin is not jaundiced or pale.     Findings: No rash.  Neurological:     Mental Status: She is alert.  Psychiatric:        Mood and Affect: Mood normal.        Behavior: Behavior normal.     PE limited by telephone encounter  No results found for this or any previous visit (from the past 72 hour(s)).  PHQ2/9: [facility] 07/09/2019 05/02/2019 03/28/2019  02/19/2019 10/18/2018  Decreased Interest 0 0 1 1 0  Down, Depressed, Hopeless 0 1 3 1  0  PHQ - 2 Score 0 1 4 2  0  Altered sleeping 0 2 3 2  0  Tired, decreased energy 0 1 3 2  0  Change in appetite 0 1 0 1 0  Feeling bad or failure about yourself  0 0 0 0 0  Trouble concentrating 0 0 1 0 0  Moving slowly or fidgety/restless 0 0 0 0 0  Suicidal thoughts 0 0 0 0 0  PHQ-9 Score 0 5 11 7  0  Difficult doing work/chores Not difficult at all Not difficult at all Somewhat difficult Not difficult at all Not difficult at all  Some recent data might be hidden   PHQ-2/9 Result is negative.  Reviewed today  Fall Risk:  Fall Risk  07/09/2019 05/02/2019 03/28/2019 02/19/2019 10/18/2018  Falls in the past year? 0 0 0 - 0  Number falls in past yr: 0 0 0 0 -  Injury with Fall? 0 0 0 0 -  Follow up - Falls evaluation completed - - -     Assessment and Plan:     ICD-10-CM   1. Essential hypertension  I10 CMP w GFR   BP controlled, pt sees Dr. Lady Gary who is managing metoprolol and spironolactone   2. Mixed hyperlipidemia  E78.2 CMP w GFR    Lipid Panel    ezetimibe-simvastatin (VYTORIN) 10-40 MG tablet   tolerating meds, recheck labs  3. Hypothyroidism, unspecified type  E03.9 TSH   no sx concerning for chemical hypothyroid, recheck TSH, last was over a year ago  4. Type 2 diabetes mellitus without complication, without long-term current use of insulin (HCC)  E11.9 CMP w GFR    A1C   pt endorses 20 years of prediabetes, on metformin, last A1C 6.1, likely w/o meds would be >6.5 and truly have dx of DM, tolerating meds, due for labs  5. Osteoporosis, unspecified osteoporosis type, unspecified pathological fracture presence  M81.0 CMP w GFR   per endocrinology   6. Hypomagnesemia  E83.42 CMP w GFR    Mag   recheck  7. MDD (major depressive disorder), recurrent, in full remission (HCC)  F33.42    well controlled with cymbalta 40 mg, would like to maintain the same dose  8. Cervical radiculopathy  M54.12     per specialists with recent surgery but pt states she has already been released, likely needs Pt with post op pain, radiculopathy, increase lyrica and tizanidin  9. Chronic post-operative pain  G89.28    encouraged her to ask surgeon for PT to guide her post op recovery, exercises at home cause severe pain - may consider physiatry referral  10. Chronic back pain, unspecified back location, unspecified back pain laterality  M54.9    G89.29    Disease to lumbar spine and cervical spine with operations, on Cymbalta, Lyrica, Mobic, muscle relaxers  11. Fibromyalgia  M79.7    managed by cymbalta, lyrica  12. Mixed hyperlipidemia  E78.2 CMP w GFR    Lipid Panel    ezetimibe-simvastatin (VYTORIN) 10-40 MG tablet   no concerns or SE with meds, recheck labs  13. Hypothyroidism due to medication  E03.2 levothyroxine (SYNTHROID) 100 MCG tablet   no sx of chemical hypothyroid    I discussed the assessment and treatment plan with the patient. The patient was provided an opportunity to ask questions and all were answered. The patient agreed with the plan and demonstrated an understanding of the instructions.  The patient was advised to call back or seek an in-person evaluation if the symptoms worsen or if the condition fails to improve as anticipated.  I provided 18 minutes of non-face-to-face time during this encounter.  Danelle Berry, PA-C 07/09/2011:11 PM

## 2019-07-12 LAB — HEMOGLOBIN A1C
Hgb A1c MFr Bld: 5.8 % of total Hgb — ABNORMAL HIGH (ref <5.7)
Mean Plasma Glucose: 120 (calc)
eAG (mmol/L): 6.6 (calc)

## 2019-07-12 LAB — COMPLETE METABOLIC PANEL WITH GFR
AG Ratio: 2.1 (calc) (ref 1.0–2.5)
ALT: 13 U/L (ref 6–29)
AST: 19 U/L (ref 10–35)
Albumin: 4.8 g/dL (ref 3.6–5.1)
Alkaline phosphatase (APISO): 37 U/L (ref 37–153)
BUN: 8 mg/dL (ref 7–25)
CO2: 27 mmol/L (ref 20–32)
Calcium: 10 mg/dL (ref 8.6–10.4)
Chloride: 103 mmol/L (ref 98–110)
Creat: 0.76 mg/dL (ref 0.50–0.99)
GFR, Est African American: 93 mL/min/{1.73_m2} (ref >=60)
GFR, Est Non African American: 81 mL/min/{1.73_m2} (ref >=60)
Globulin: 2.3 g/dL (calc) (ref 1.9–3.7)
Glucose, Bld: 96 mg/dL (ref 65–99)
Potassium: 4.3 mmol/L (ref 3.5–5.3)
Sodium: 138 mmol/L (ref 135–146)
Total Bilirubin: 0.3 mg/dL (ref 0.2–1.2)
Total Protein: 7.1 g/dL (ref 6.1–8.1)

## 2019-07-12 LAB — LIPID PANEL
Cholesterol: 150 mg/dL (ref <200)
HDL: 47 mg/dL — ABNORMAL LOW (ref >=50)
LDL Cholesterol (Calc): 78 mg/dL (calc)
Non-HDL Cholesterol (Calc): 103 mg/dL (calc) (ref <130)
Total CHOL/HDL Ratio: 3.2 (calc) (ref <5.0)
Triglycerides: 148 mg/dL (ref <150)

## 2019-07-12 LAB — TSH: TSH: 0.28 mIU/L — ABNORMAL LOW (ref 0.40–4.50)

## 2019-07-12 LAB — MAGNESIUM: Magnesium: 2 mg/dL (ref 1.5–2.5)

## 2019-07-14 ENCOUNTER — Encounter: Payer: Self-pay | Admitting: Family Medicine

## 2019-07-14 DIAGNOSIS — M549 Dorsalgia, unspecified: Secondary | ICD-10-CM

## 2019-07-14 DIAGNOSIS — G8928 Other chronic postprocedural pain: Secondary | ICD-10-CM

## 2019-07-14 DIAGNOSIS — M5412 Radiculopathy, cervical region: Secondary | ICD-10-CM

## 2019-07-14 DIAGNOSIS — G8929 Other chronic pain: Secondary | ICD-10-CM

## 2019-07-14 DIAGNOSIS — M797 Fibromyalgia: Secondary | ICD-10-CM

## 2019-07-14 MED ORDER — PREGABALIN 50 MG PO CAPS: 50.0000 mg | ORAL_CAPSULE | Freq: Three times a day (TID) | ORAL | 0 refills | Status: DC

## 2019-07-31 ENCOUNTER — Other Ambulatory Visit: Payer: Self-pay | Admitting: Emergency Medicine

## 2019-07-31 ENCOUNTER — Telehealth: Payer: Self-pay | Admitting: Family Medicine

## 2019-07-31 DIAGNOSIS — M542 Cervicalgia: Secondary | ICD-10-CM

## 2019-07-31 MED ORDER — MELOXICAM 15 MG PO TABS: 7.5000 mg | ORAL_TABLET | Freq: Every day | ORAL | 0 refills | Status: DC

## 2019-07-31 NOTE — Telephone Encounter (Signed)
meloxicam (MOBIC) 15 MG tablet    Patient is requesting refill. Patient is out of medication.     [place], [place] - [place] Phone:  [phone]  Fax:  [phone]

## 2019-08-14 ENCOUNTER — Other Ambulatory Visit: Payer: Self-pay

## 2019-08-14 ENCOUNTER — Ambulatory Visit (INDEPENDENT_AMBULATORY_CARE_PROVIDER_SITE_OTHER): Payer: Medicare PPO

## 2019-08-14 VITALS — BP 120/70 | HR 68 | Temp 97.1°F | Resp 16 | Ht 64.0 in | Wt 179.6 lb

## 2019-08-14 DIAGNOSIS — Z0000 Encounter for general adult medical examination without abnormal findings: Secondary | ICD-10-CM | POA: Diagnosis not present

## 2019-08-14 DIAGNOSIS — Z1231 Encounter for screening mammogram for malignant neoplasm of breast: Secondary | ICD-10-CM | POA: Diagnosis not present

## 2019-08-14 NOTE — Progress Notes (Signed)
Subjective:   Brittany Hill is a 69 y.o. female who presents for Medicare Annual (Subsequent) preventive examination.  Review of Systems:   Cardiac Risk Factors include: advanced age (>28men, >50 women);dyslipidemia;hypertension;obesity (BMI >30kg/m2);diabetes mellitus     Objective:     Vitals: BP 120/70 (BP Location: Left Arm, Patient Position: Sitting, Cuff Size: Normal)   Pulse 68   Temp (!) 97.1 F (36.2 C) (Temporal)   Resp 16   Ht 5\' 4"  (1.626 m)   Wt 179 lb 9.6 oz (81.5 kg)   SpO2 96%   BMI 30.83 kg/m   Body mass index is 30.83 kg/m.  Advanced Directives 08/14/2019 08/06/2018 10/25/2017 09/27/2017 10/27/2016 07/28/2016 06/14/2016  Does Patient Have a Medical Advance Directive? Yes Yes Yes Yes Yes No No  Type of 13/03/2016 of [place];Living will Healthcare Power of [place];Living will Healthcare Power of [place];Living will Healthcare Power of [place];Living will - - -  Does patient want to make changes to medical advance directive? - - No - Patient declined No - Patient declined - - -  Copy of Healthcare Power of Attorney in Chart? No - copy requested No - copy requested No - copy requested No - copy requested - - -  Would patient like information on creating a medical advance directive? - - - - - - No - patient declined information    Tobacco Social History   Tobacco Use  Smoking Status Never Smoker  Smokeless Tobacco Never Used     Counseling given: Not Answered   Clinical Intake:  Pre-visit preparation completed: Yes  Pain : No/denies pain     BMI - recorded: 30.83 Nutritional Status: BMI > 30  Obese Nutritional Risks: None Diabetes: Yes CBG done?: No Did pt. bring in CBG monitor from home?: No   Nutrition Risk Assessment:  Has the patient had any N/V/D within the last 2 months?  No  Does the patient have any non-healing wounds?  No  Has the patient had any unintentional weight loss or weight gain?  No    Diabetes:  Is the patient diabetic?  Yes  If diabetic, was a CBG obtained today?  No  Did the patient bring in their glucometer from home?  No  How often do you monitor your CBG's? Pt does not actively check blood sugar.   Financial Strains and Diabetes Management:  Are you having any financial strains with the device, your supplies or your medication? No .  Does the patient want to be seen by Chronic Care Management for management of their diabetes?  No  Would the patient like to be referred to a Nutritionist or for Diabetic Management?  No   Diabetic Exams:  Diabetic Eye Exam: Completed 07/25/17. Overdue for diabetic eye exam. Pt has been advised about the importance in completing this exam. Pt's appt postponed in 2020 due to Covid-19.  Diabetic Foot Exam: Completed 09/27/18.   How often do you need to have someone help you when you read instructions, pamphlets, or other written materials from your doctor or pharmacy?: 1 - Never  Interpreter Needed?: No  Information entered by :: [email or web address] LPN  Past Medical History:  Diagnosis Date  . Allergy   . Anxiety   . Asthma    as a teenager  . Chronic constipation   . Complication of anesthesia   . Depression   . Dysrhythmia   . Fibromyalgia    Bells Palsy  . Full code status 08/02/2017  Patient wishes to have resuscitative efforts made if collapses; but does not want to remain on life support or if condition is terminal -- see copy of living will  . Genetic testing 08/28/2017   Multi-Cancer panel (83 genes) @ [facility] - No pathogenic mutations detected  . GERD (gastroesophageal reflux disease)   . Heart murmur   . Hyperlipidemia   . Hypertension   . Hypothyroidism   . IFG (impaired fasting glucose)   . MS (multiple sclerosis) (HCC)    hx of long-term corticosteroid use  . OA (osteoarthritis)    osteoporosis  . Osteoporosis 05/2014   managed by Rheumatologist  . Pneumonia 2017  . PONV (postoperative nausea and  vomiting)   . TMJ syndrome    Past Surgical History:  Procedure Laterality Date  . ABDOMINAL HYSTERECTOMY     complete at age 67; HRT for ~25y  . BACK SURGERY    . CATARACT EXTRACTION W/PHACO Left 09/27/2017   Procedure: CATARACT EXTRACTION PHACO AND INTRAOCULAR LENS PLACEMENT (IOC);  Surgeon: Nevada Crane, MD;  Location: [facility] ORS;  Service: Ophthalmology;  Laterality: Left;  Lot #[id number] H [place]:   00:27.2 AP%:  13.7 CDE:  3.75  . CATARACT EXTRACTION W/PHACO Right 10/25/2017   Procedure: CATARACT EXTRACTION PHACO AND INTRAOCULAR LENS PLACEMENT (IOC);  Surgeon: Nevada Crane, MD;  Location: [facility] ORS;  Service: Ophthalmology;  Laterality: Right;  Lot # [id number] H [place]:   00:23.9 AP%   :8.7 CDE:   2.10  . Cataracts Bilateral    09/27/17 and 10/25/17  . COLONOSCOPY WITH ESOPHAGOGASTRODUODENOSCOPY (EGD)  12/2017   [facility]  . JOINT REPLACEMENT    . KNEE ARTHROSCOPY Right    x 2  . lumbarectomy with fusion  [id number]  . ROTATOR CUFF REPAIR    . tear duct      surgery  . TMJ ARTHROPLASTY    . TOTAL KNEE ARTHROPLASTY Bilateral 09/2007   Family History  Problem Relation Age of Onset  . Heart disease Mother   . Hypertension Mother   . Lymphoma Mother 78       deceased 110  . Stroke Father   . Heart attack Father   . Heart disease Father   . Hypertension Father   . Alcohol abuse Father   . Hypertension Sister   . Colon cancer Sister 25  . Hypertension Brother   . Diabetes Brother   . Mental illness Brother   . Depression Brother   . Lung cancer Brother 21       smoker; currently 55  . Alcohol abuse Son   . Breast cancer Maternal Aunt 75       currently 40  . Breast cancer Maternal Aunt        dx 80s; mets in 38s  . AAA (abdominal aortic aneurysm) Maternal Grandmother   . Heart disease Maternal Grandmother   . Heart attack Paternal Grandfather   . Throat cancer Paternal Uncle        smoker   Social History   Socioeconomic History  . Marital status:  Married    Spouse name: Dorene Sorrow  . Number of children: 2  . Years of education: Not on file  . Highest education level: Master's degree (e.g., MA, MS, MEng, MEd, MSW, MBA)  Occupational History  . Occupation: retired  Tobacco Use  . Smoking status: Never Smoker  . Smokeless tobacco: Never Used  Substance and Sexual Activity  . Alcohol use: No    Alcohol/week: 0.0  standard drinks  . Drug use: No  . Sexual activity: Not Currently  Other Topics Concern  . Not on file  Social History Narrative  . Not on file   Social Determinants of Health   Financial Resource Strain: Low Risk   . Difficulty of Paying Living Expenses: Not hard at all  Food Insecurity: No Food Insecurity  . Worried About Charity fundraiser in the Last Year: Never true  . Ran Out of Food in the Last Year: Never true  Transportation Needs: No Transportation Needs  . Lack of Transportation (Medical): No  . Lack of Transportation (Non-Medical): No  Physical Activity: Inactive  . Days of Exercise per Week: 0 days  . Minutes of Exercise per Session: 0 min  Stress: Stress Concern Present  . Feeling of Stress : To some extent  Social Connections: Slightly Isolated  . Frequency of Communication with Friends and Family: More than three times a week  . Frequency of Social Gatherings with Friends and Family: More than three times a week  . Attends Religious Services: More than 4 times per year  . Active Member of Clubs or Organizations: No  . Attends Archivist Meetings: Never  . Marital Status: Married    Outpatient Encounter Medications as of 08/14/2019  Medication Sig  . acetaminophen (TYLENOL) 500 MG tablet Take 1,000 mg by mouth every 6 (six) hours as needed.  . calcium carbonate (CALCIUM 600) 600 MG TABS tablet Take 600 mg by mouth as needed.   . DULoxetine (CYMBALTA) 20 MG capsule Take 2 capsules (40 mg total) by mouth daily.  Marland Kitchen EPINEPHrine 0.3 mg/0.3 mL IJ SOAJ injection Inject 0.3 mg into the muscle  once.   . ezetimibe-simvastatin (VYTORIN) 10-40 MG tablet Take 1 tablet by mouth at bedtime.  . fluticasone (FLONASE) 50 MCG/ACT nasal spray Place 2 sprays into both nostrils daily.  Marland Kitchen ibuprofen (ADVIL) 200 MG tablet Take 400 mg by mouth every 8 (eight) hours as needed.  Marland Kitchen levothyroxine (SYNTHROID) 100 MCG tablet Take 1 tablet (100 mcg total) by mouth daily before breakfast.  . meloxicam (MOBIC) 15 MG tablet Take 0.5-1 tablets (7.5-15 mg total) by mouth daily.  . metFORMIN (GLUCOPHAGE-XR) 500 MG 24 hr tablet Take 1 tablet (500 mg total) by mouth 2 (two) times daily.  . metoprolol succinate (TOPROL-XL) 50 MG 24 hr tablet Take 1 tablet (50 mg total) by mouth at bedtime. Take with or immediately following a meal.  . omeprazole (PRILOSEC) 40 MG capsule Take 1 capsule (40 mg total) by mouth daily.  . pregabalin (LYRICA) 50 MG capsule Take 1 capsule (50 mg total) by mouth 3 (three) times daily. (Patient taking differently: Take 50 mg by mouth 3 (three) times daily. Pt taking 50 mg qam and 100 mg qhs)  . spironolactone (ALDACTONE) 50 MG tablet Take 25 mg by mouth daily. With breakfast  . tiZANidine (ZANAFLEX) 4 MG tablet Take 1-1.5 tablets (4-6 mg total) by mouth every 8 (eight) hours as needed for muscle spasms.  . Vitamin D, Cholecalciferol, 50 MCG (2000 UT) CAPS Take 2,000 Units by mouth daily.   . [DISCONTINUED] naproxen sodium (ALEVE) 220 MG tablet Take 220-440 mg by mouth See admin instructions. Take 220 mg by mouth in the morning and take 440 mg by mouth at bedtime   No facility-administered encounter medications on file as of 08/14/2019.    Activities of Daily Living In your present state of health, do you have any difficulty performing the  following activities: 08/14/2019 07/09/2019  Hearing? N N  Vision? N N  Difficulty concentrating or making decisions? N N  Walking or climbing stairs? N N  Dressing or bathing? N N  Doing errands, shopping? N N  Preparing Food and eating ? N -  Using the  Toilet? N -  In the past six months, have you accidently leaked urine? N -  Do you have problems with loss of bowel control? N -  Managing your Medications? N -  Managing your Finances? N -  Housekeeping or managing your Housekeeping? N -  Some recent data might be hidden    Patient Care Team: Danelle Berry, PA-C as PCP - General (Family Medicine) Bettey Mare, MD as Referring Physician (Neurosurgery) Lady Gary Darlin Priestly, MD as Consulting Physician (Cardiology) Barbie Banner Cliffton Asters (Inactive) as Referring Physician    Assessment:   This is a routine wellness examination for [place].  Exercise Activities and Dietary recommendations Current Exercise Habits: The patient does not participate in regular exercise at present, Exercise limited by: orthopedic condition(s)  Goals    . Weight (lb) < 185 lb (83.9 kg)     Over the next year       Fall Risk Fall Risk  08/14/2019 07/09/2019 05/02/2019 03/28/2019 02/19/2019  Falls in the past year? 0 0 0 0 -  Number falls in past yr: 0 0 0 0 0  Injury with Fall? 0 0 0 0 0  Risk for fall due to : No Fall Risks - - - -  Follow up Falls prevention discussed - Falls evaluation completed - -   FALL RISK PREVENTION PERTAINING TO THE HOME:  Any stairs in or around the home? Yes  If so, do they handrails? Yes   Home free of loose throw rugs in walkways, pet beds, electrical cords, etc? Yes  Adequate lighting in your home to reduce risk of falls? Yes   ASSISTIVE DEVICES UTILIZED TO PREVENT FALLS:  Life alert? No  Use of a cane, walker or w/c? No  Grab bars in the bathroom? Yes  Shower chair or bench in shower? Yes  Elevated toilet seat or a handicapped toilet? Yes   DME ORDERS:  DME order needed?  No   TIMED UP AND GO:  Was the test performed? Yes .  Length of time to ambulate 10 feet: 5 sec.   GAIT:  Appearance of gait: Gait stead-fast and without the use of an assistive device.   Education: Fall risk prevention has been discussed.   Intervention(s) required? No   Depression Screen PHQ 2/9 Scores 08/14/2019 07/09/2019 05/02/2019 03/28/2019  PHQ - 2 Score 0 0 1 4  PHQ- 9 Score - 0 5 11     Cognitive Function 6CIT deferred for 2020 AWV. Pt has no memory issues.      6CIT Screen 08/06/2018  What Year? 0 points  What month? 0 points  What time? 0 points  Count back from 20 0 points  Months in reverse 0 points  Repeat phrase 0 points  Total Score 0    Immunization History  Administered Date(s) Administered  . Fluad Quad(high Dose 65+) 05/02/2019  . Influenza, High Dose Seasonal PF 05/16/2018  . Influenza,inj,Quad PF,6+ Mos 06/03/2015  . Influenza-Unspecified 05/17/2012, 05/10/2017  . Pneumococcal Polysaccharide-23 08/02/2017  . Td 06/19/1999  . Tdap 03/12/2010  . Zoster 08/08/2011    Qualifies for Shingles Vaccine? Yes  Zostavax completed 2013. Due for Shingrix. Education has been provided regarding the importance  of this vaccine. Pt has been advised to call insurance company to determine out of pocket expense. Advised may also receive vaccine at local pharmacy or Health Dept. Verbalized acceptance and understanding.  Tdap: Up to date  Flu Vaccine: Up to date  Pneumococcal Vaccine: Up to date   Screening Tests Health Maintenance  Topic Date Due  . OPHTHALMOLOGY EXAM  07/25/2018  . URINE MICROALBUMIN  03/29/2019  . FOOT EXAM  09/28/2019  . MAMMOGRAM  10/17/2019  . HEMOGLOBIN A1C  01/09/2020  . TETANUS/TDAP  03/12/2020  . DEXA SCAN  10/16/2020  . COLONOSCOPY  01/11/2023  . INFLUENZA VACCINE  Completed  . PNA vac Low Risk Adult  Completed  . Hepatitis C Screening  Addressed    Cancer Screenings:  Colorectal Screening: Completed 01/10/18. Repeat every 5 years  Mammogram: Completed 10/17/18. Repeat every year. Ordered today. Pt provided with contact information and advised to call to schedule appt.   Bone Density: Completed 10/17/18. Results reflect OSTEOPENIA. Repeat every 2 years.   Lung Cancer  Screening: (Low Dose CT Chest recommended if Age 25-80 years, 30 pack-year currently smoking OR have quit w/in 15years.) does not qualify.   Additional Screening:  Hepatitis C Screening: does qualify; Completed 05/07/14  Vision Screening: Recommended annual ophthalmology exams for early detection of glaucoma and other disorders of the eye. Is the patient up to date with their annual eye exam?  No  - postponed due to covid Who is the provider or what is the name of the office in which the pt attends annual eye exams? [facility]  Dental Screening: Recommended annual dental exams for proper oral hygiene  Community Resource Referral:  CRR required this visit?  No      Plan:     I have personally reviewed and addressed the Medicare Annual Wellness questionnaire and have noted the following in the patient's chart:  A. Medical and social history B. Use of alcohol, tobacco or illicit drugs  C. Current medications and supplements D. Functional ability and status E.  Nutritional status F.  Physical activity G. Advance directives H. List of other physicians I.  Hospitalizations, surgeries, and ER visits in previous 12 months J.  Vitals K. Screenings such as hearing and vision if needed, cognitive and depression L. Referrals and appointments   In addition, I have reviewed and discussed with patient certain preventive protocols, quality metrics, and best practice recommendations. A written personalized care plan for preventive services as well as general preventive health recommendations were provided to patient.   Signed,  Reather Littler, LPN Nurse Health Advisor   Nurse Notes: none

## 2019-08-14 NOTE — Patient Instructions (Signed)
Brittany Hill , Thank you for taking time to come for your Medicare Wellness Visit. I appreciate your ongoing commitment to your health goals. Please review the following plan we discussed and let me know if I can assist you in the future.   Screening recommendations/referrals: Colonoscopy: done 01/10/18. Repeat in 2024. Mammogram: done 10/17/18. Please call [phone] to schedule your mammogram.  Bone Density: done 10/17/18 Recommended yearly ophthalmology/optometry visit for glaucoma screening and checkup Recommended yearly dental visit for hygiene and checkup  Vaccinations: Influenza vaccine: done 05/02/19 Pneumococcal vaccine: done 08/02/17 Tdap vaccine: done 03/12/10 Shingles vaccine: Shingrix discussed. Please contact your pharmacy for coverage information.   Advanced directives: Please bring a copy of your health care power of attorney and living will to the office at your convenience.  Conditions/risks identified: Recommend increasing physical activity as tolerated  Next appointment: Please follow up in one year for your Medicare Annual Wellness visit.     Preventive Care 64 Years and Older, Female Preventive care refers to lifestyle choices and visits with your health care provider that can promote health and wellness. What does preventive care include?  A yearly physical exam. This is also called an annual well check.  Dental exams once or twice a year.  Routine eye exams. Ask your health care provider how often you should have your eyes checked.  Personal lifestyle choices, including:  Daily care of your teeth and gums.  Regular physical activity.  Eating a healthy diet.  Avoiding tobacco and drug use.  Limiting alcohol use.  Practicing safe sex.  Taking low-dose aspirin every day.  Taking vitamin and mineral supplements as recommended by your health care provider. What happens during an annual well check? The services and screenings done by your health care  provider during your annual well check will depend on your age, overall health, lifestyle risk factors, and family history of disease. Counseling  Your health care provider may ask you questions about your:  Alcohol use.  Tobacco use.  Drug use.  Emotional well-being.  Home and relationship well-being.  Sexual activity.  Eating habits.  History of falls.  Memory and ability to understand (cognition).  Work and work Astronomer.  Reproductive health. Screening  You may have the following tests or measurements:  Height, weight, and BMI.  Blood pressure.  Lipid and cholesterol levels. These may be checked every 5 years, or more frequently if you are over 23 years old.  Skin check.  Lung cancer screening. You may have this screening every year starting at age 82 if you have a 30-pack-year history of smoking and currently smoke or have quit within the past 15 years.  Fecal occult blood test (FOBT) of the stool. You may have this test every year starting at age 20.  Flexible sigmoidoscopy or colonoscopy. You may have a sigmoidoscopy every 5 years or a colonoscopy every 10 years starting at age 31.  Hepatitis C blood test.  Hepatitis B blood test.  Sexually transmitted disease (STD) testing.  Diabetes screening. This is done by checking your blood sugar (glucose) after you have not eaten for a while (fasting). You may have this done every 1-3 years.  Bone density scan. This is done to screen for osteoporosis. You may have this done starting at age 84.  Mammogram. This may be done every 1-2 years. Talk to your health care provider about how often you should have regular mammograms. Talk with your health care provider about your test results, treatment options, and if  necessary, the need for more tests. Vaccines  Your health care provider may recommend certain vaccines, such as:  Influenza vaccine. This is recommended every year.  Tetanus, diphtheria, and acellular  pertussis (Tdap, Td) vaccine. You may need a Td booster every 10 years.  Zoster vaccine. You may need this after age 47.  Pneumococcal 13-valent conjugate (PCV13) vaccine. One dose is recommended after age 57.  Pneumococcal polysaccharide (PPSV23) vaccine. One dose is recommended after age 72. Talk to your health care provider about which screenings and vaccines you need and how often you need them. This information is not intended to replace advice given to you by your health care provider. Make sure you discuss any questions you have with your health care provider. Document Released: 08/20/2015 Document Revised: 04/12/2016 Document Reviewed: 05/25/2015 Elsevier Interactive Patient Education  2017 [place] Prevention in the Home Falls can cause injuries. They can happen to people of all ages. There are many things you can do to make your home safe and to help prevent falls. What can I do on the outside of my home?  Regularly fix the edges of walkways and driveways and fix any cracks.  Remove anything that might make you trip as you walk through a door, such as a raised step or threshold.  Trim any bushes or trees on the path to your home.  Use bright outdoor lighting.  Clear any walking paths of anything that might make someone trip, such as rocks or tools.  Regularly check to see if handrails are loose or broken. Make sure that both sides of any steps have handrails.  Any raised decks and porches should have guardrails on the edges.  Have any leaves, snow, or ice cleared regularly.  Use sand or salt on walking paths during winter.  Clean up any spills in your garage right away. This includes oil or grease spills. What can I do in the bathroom?  Use night lights.  Install grab bars by the toilet and in the tub and shower. Do not use towel bars as grab bars.  Use non-skid mats or decals in the tub or shower.  If you need to sit down in the shower, use a plastic,  non-slip stool.  Keep the floor dry. Clean up any water that spills on the floor as soon as it happens.  Remove soap buildup in the tub or shower regularly.  Attach bath mats securely with double-sided non-slip rug tape.  Do not have throw rugs and other things on the floor that can make you trip. What can I do in the bedroom?  Use night lights.  Make sure that you have a light by your bed that is easy to reach.  Do not use any sheets or blankets that are too big for your bed. They should not hang down onto the floor.  Have a firm chair that has side arms. You can use this for support while you get dressed.  Do not have throw rugs and other things on the floor that can make you trip. What can I do in the kitchen?  Clean up any spills right away.  Avoid walking on wet floors.  Keep items that you use a lot in easy-to-reach places.  If you need to reach something above you, use a strong step stool that has a grab bar.  Keep electrical cords out of the way.  Do not use floor polish or wax that makes floors slippery. If you must  use wax, use non-skid floor wax.  Do not have throw rugs and other things on the floor that can make you trip. What can I do with my stairs?  Do not leave any items on the stairs.  Make sure that there are handrails on both sides of the stairs and use them. Fix handrails that are broken or loose. Make sure that handrails are as long as the stairways.  Check any carpeting to make sure that it is firmly attached to the stairs. Fix any carpet that is loose or worn.  Avoid having throw rugs at the top or bottom of the stairs. If you do have throw rugs, attach them to the floor with carpet tape.  Make sure that you have a light switch at the top of the stairs and the bottom of the stairs. If you do not have them, ask someone to add them for you. What else can I do to help prevent falls?  Wear shoes that:  Do not have high heels.  Have rubber  bottoms.  Are comfortable and fit you well.  Are closed at the toe. Do not wear sandals.  If you use a stepladder:  Make sure that it is fully opened. Do not climb a closed stepladder.  Make sure that both sides of the stepladder are locked into place.  Ask someone to hold it for you, if possible.  Clearly mark and make sure that you can see:  Any grab bars or handrails.  First and last steps.  Where the edge of each step is.  Use tools that help you move around (mobility aids) if they are needed. These include:  Canes.  Walkers.  Scooters.  Crutches.  Turn on the lights when you go into a dark area. Replace any light bulbs as soon as they burn out.  Set up your furniture so you have a clear path. Avoid moving your furniture around.  If any of your floors are uneven, fix them.  If there are any pets around you, be aware of where they are.  Review your medicines with your doctor. Some medicines can make you feel dizzy. This can increase your chance of falling. Ask your doctor what other things that you can do to help prevent falls. This information is not intended to replace advice given to you by your health care provider. Make sure you discuss any questions you have with your health care provider. Document Released: 05/20/2009 Document Revised: 12/30/2015 Document Reviewed: 08/28/2014 Elsevier Interactive Patient Education  2017 [employer].

## 2019-08-22 ENCOUNTER — Ambulatory Visit (INDEPENDENT_AMBULATORY_CARE_PROVIDER_SITE_OTHER): Payer: Medicare PPO | Admitting: Family Medicine

## 2019-08-22 ENCOUNTER — Encounter: Payer: Self-pay | Admitting: Family Medicine

## 2019-08-22 DIAGNOSIS — J0110 Acute frontal sinusitis, unspecified: Secondary | ICD-10-CM

## 2019-08-22 DIAGNOSIS — R058 Other specified cough: Secondary | ICD-10-CM

## 2019-08-22 DIAGNOSIS — R05 Cough: Secondary | ICD-10-CM | POA: Diagnosis not present

## 2019-08-22 MED ORDER — PROMETHAZINE-DM 6.25-15 MG/5ML PO SYRP: 5.0000 mL | ORAL_SOLUTION | Freq: Four times a day (QID) | ORAL | 0 refills | Status: DC | PRN

## 2019-08-22 MED ORDER — AMOXICILLIN-POT CLAVULANATE 875-125 MG PO TABS: 1.0000 | ORAL_TABLET | Freq: Two times a day (BID) | ORAL | 0 refills | Status: AC

## 2019-08-22 NOTE — Progress Notes (Signed)
Name: Brittany Hill   MRN: [id number]    DOB: [birth year]   Date:08/22/2019       Progress Note  Subjective  Chief Complaint  Chief Complaint  Patient presents with  . Sinus Problem    I connected with  [name] on 08/22/19 at  7:40 AM EST by telephone and verified that I am speaking with the correct person using two identifiers.   I discussed the limitations, risks, security and privacy concerns of performing an evaluation and management service by telephone and the availability of in person appointments. Staff also discussed with the patient that there may be a patient responsible charge related to this service. Patient Location: Home Provider Location: Office Additional Individuals present: None  HPI  Pt presents with concern for possible sinusitis - symptoms for over two weeks.  She notes that she gets this about once a year. She notes scant amount of blood when she blows her nose, also has been coughing out some green phlegm.  Denies chest pain, shortness of breath, fevers/chills, GI upset, loss of taste or smell.  Using humidifier, flonase, tylenol PRN, tried an antibiotic nasal cream borrowed from her husband.  She does endorse frontal sinus tenderness L>R.    Patient Active Problem List   Diagnosis Date Noted  . Displacement of cervical intervertebral disc 05/14/2019  . Fatty liver disease, nonalcoholic 03/28/2019  . Osteopenia 10/20/2018  . Obesity (BMI 30.0-34.9) 09/27/2018  . Cataract, left 04/02/2018  . Osteoarthritis of left ankle 02/16/2018  . Asthma without status asthmaticus 10/05/2017  . Genetic testing 08/28/2017  . Family hx-breast malignancy 08/02/2017  . Dense breast tissue on mammogram 07/28/2016  . Left sided sciatica 12/19/2015  . Left lumbar radiculopathy 12/16/2015  . Hypomagnesemia 12/06/2015  . Hypokalemia 12/02/2015  . Fibromyalgia   . MS (multiple sclerosis) (HCC)   . Chronic constipation   . OA (osteoarthritis)   . Hypothyroidism    . IFG (impaired fasting glucose)   . Hypertension   . Hyperlipidemia   . GERD (gastroesophageal reflux disease)   . MDD (major depressive disorder), recurrent, in full remission (HCC)   . Anxiety   . TMJ syndrome     Social History   Tobacco Use  . Smoking status: Never Smoker  . Smokeless tobacco: Never Used  Substance Use Topics  . Alcohol use: No    Alcohol/week: 0.0 standard drinks     Current Outpatient Medications:  .  acetaminophen (TYLENOL) 500 MG tablet, Take 1,000 mg by mouth every 6 (six) hours as needed., Disp: , Rfl:  .  calcium carbonate (CALCIUM 600) 600 MG TABS tablet, Take 600 mg by mouth as needed. , Disp: , Rfl:  .  DULoxetine (CYMBALTA) 20 MG capsule, Take 2 capsules (40 mg total) by mouth Brittany., Disp: 180 capsule, Rfl: 3 .  EPINEPHrine 0.3 mg/0.3 mL IJ SOAJ injection, Inject 0.3 mg into the muscle once. , Disp: , Rfl:  .  ezetimibe-simvastatin (VYTORIN) 10-40 MG tablet, Take 1 tablet by mouth at bedtime., Disp: 90 tablet, Rfl: 1 .  fluticasone (FLONASE) 50 MCG/ACT nasal spray, Place 2 sprays into both nostrils Brittany., Disp: , Rfl:  .  ibuprofen (ADVIL) 200 MG tablet, Take 400 mg by mouth every 8 (eight) hours as needed., Disp: , Rfl:  .  levothyroxine (SYNTHROID) 100 MCG tablet, Take 1 tablet (100 mcg total) by mouth Brittany before breakfast., Disp: 90 tablet, Rfl: 1 .  meloxicam (MOBIC) 15 MG tablet, Take 0.5-1 tablets (  7.5-15 mg total) by mouth Brittany., Disp: 30 tablet, Rfl: 0 .  metFORMIN (GLUCOPHAGE-XR) 500 MG 24 hr tablet, Take 1 tablet (500 mg total) by mouth 2 (two) times Brittany., Disp: 180 tablet, Rfl: 1 .  metoprolol succinate (TOPROL-XL) 50 MG 24 hr tablet, Take 1 tablet (50 mg total) by mouth at bedtime. Take with or immediately following a meal., Disp: , Rfl:  .  pregabalin (LYRICA) 50 MG capsule, Take 1 capsule (50 mg total) by mouth 3 (three) times Brittany. (Patient taking differently: Take 50 mg by mouth 3 (three) times Brittany. Pt taking 50 mg qam and  100 mg qhs), Disp: 90 capsule, Rfl: 0 .  spironolactone (ALDACTONE) 50 MG tablet, Take 50 mg by mouth Brittany. With breakfast, Disp: , Rfl:  .  tiZANidine (ZANAFLEX) 4 MG tablet, Take 1-1.5 tablets (4-6 mg total) by mouth every 8 (eight) hours as needed for muscle spasms., Disp: 90 tablet, Rfl: 2 .  Vitamin D, Cholecalciferol, 50 MCG (2000 UT) CAPS, Take 2,000 Units by mouth Brittany. , Disp: , Rfl:  .  amoxicillin-clavulanate (AUGMENTIN) 875-125 MG tablet, Take 1 tablet by mouth 2 (two) times Brittany for 10 days., Disp: 20 tablet, Rfl: 0 .  omeprazole (PRILOSEC) 40 MG capsule, Take 1 capsule (40 mg total) by mouth Brittany., Disp: 90 capsule, Rfl: 0 .  promethazine-dextromethorphan (PROMETHAZINE-DM) 6.25-15 MG/5ML syrup, Take 5 mLs by mouth 4 (four) times Brittany as needed., Disp: 118 mL, Rfl: 0  Allergies  Allergen Reactions  . Shellfish Allergy Rash and Swelling  . Codeine Nausea And Vomiting  . Sulfa Antibiotics Swelling    I personally reviewed active problem list, medication list, allergies, notes from last encounter, lab results with the patient/caregiver today.  ROS  Ten systems reviewed and is negative except as mentioned in HPI  Objective  Virtual encounter, vitals not obtained.  There is no height or weight on file to calculate BMI.  Nursing Note and Vital Signs reviewed.  Physical Exam  Pulmonary/Chest: Effort normal. No respiratory distress. Speaking in complete sentences Neurological: Pt is alert and oriented to person, place, and time.  Speech is normal Psychiatric: Patient has a normal mood and affect. behavior is normal. Judgment and thought content normal.  No results found for this or any previous visit (from the past 72 hour(s)).  Assessment & Plan  1. Acute non-recurrent frontal sinusitis - amoxicillin-clavulanate (AUGMENTIN) 875-125 MG tablet; Take 1 tablet by mouth 2 (two) times Brittany for 10 days.  Dispense: 20 tablet; Refill: 0 - promethazine-dextromethorphan  (PROMETHAZINE-DM) 6.25-15 MG/5ML syrup; Take 5 mLs by mouth 4 (four) times Brittany as needed.  Dispense: 118 mL; Refill: 0  2. Productive cough - amoxicillin-clavulanate (AUGMENTIN) 875-125 MG tablet; Take 1 tablet by mouth 2 (two) times Brittany for 10 days.  Dispense: 20 tablet; Refill: 0 - promethazine-dextromethorphan (PROMETHAZINE-DM) 6.25-15 MG/5ML syrup; Take 5 mLs by mouth 4 (four) times Brittany as needed.  Dispense: 118 mL; Refill: 0    -Red flags and when to present for emergency care or RTC including fever >101.43F, chest pain, shortness of breath, new/worsening/un-resolving symptoms, reviewed with patient at time of visit. Follow up and care instructions discussed and provided in AVS. - I discussed the assessment and treatment plan with the patient. The patient was provided an opportunity to ask questions and all were answered. The patient agreed with the plan and demonstrated an understanding of the instructions.  - The patient was advised to call back or seek an in-person evaluation if the  symptoms worsen or if the condition fails to improve as anticipated.  I provided 16 minutes of non-face-to-face time during this encounter.  Hubbard Hartshorn, FNP

## 2019-08-25 ENCOUNTER — Encounter: Payer: Self-pay | Admitting: Family Medicine

## 2019-08-25 ENCOUNTER — Other Ambulatory Visit: Payer: Self-pay | Admitting: Family Medicine

## 2019-08-25 DIAGNOSIS — G8928 Other chronic postprocedural pain: Secondary | ICD-10-CM

## 2019-08-25 DIAGNOSIS — G8929 Other chronic pain: Secondary | ICD-10-CM

## 2019-08-25 DIAGNOSIS — M5412 Radiculopathy, cervical region: Secondary | ICD-10-CM

## 2019-08-25 DIAGNOSIS — M797 Fibromyalgia: Secondary | ICD-10-CM

## 2019-08-25 DIAGNOSIS — E039 Hypothyroidism, unspecified: Secondary | ICD-10-CM

## 2019-08-25 DIAGNOSIS — Z5181 Encounter for therapeutic drug level monitoring: Secondary | ICD-10-CM

## 2019-08-25 MED ORDER — PREGABALIN 100 MG PO CAPS: ORAL_CAPSULE | ORAL | 2 refills | Status: DC

## 2019-09-05 ENCOUNTER — Encounter: Payer: Self-pay | Admitting: Family Medicine

## 2019-09-05 ENCOUNTER — Ambulatory Visit: Payer: Medicare PPO | Admitting: Family Medicine

## 2019-09-05 ENCOUNTER — Other Ambulatory Visit: Payer: Self-pay

## 2019-09-05 VITALS — BP 122/70 | HR 83 | Temp 98.1°F | Resp 12 | Ht 64.0 in | Wt 179.5 lb

## 2019-09-05 DIAGNOSIS — M549 Dorsalgia, unspecified: Secondary | ICD-10-CM

## 2019-09-05 DIAGNOSIS — M797 Fibromyalgia: Secondary | ICD-10-CM

## 2019-09-05 DIAGNOSIS — M542 Cervicalgia: Secondary | ICD-10-CM

## 2019-09-05 DIAGNOSIS — E039 Hypothyroidism, unspecified: Secondary | ICD-10-CM | POA: Diagnosis not present

## 2019-09-05 DIAGNOSIS — M5412 Radiculopathy, cervical region: Secondary | ICD-10-CM | POA: Insufficient documentation

## 2019-09-05 DIAGNOSIS — G8928 Other chronic postprocedural pain: Secondary | ICD-10-CM | POA: Insufficient documentation

## 2019-09-05 DIAGNOSIS — G8929 Other chronic pain: Secondary | ICD-10-CM

## 2019-09-05 LAB — TSH: TSH: 0.19 mIU/L — ABNORMAL LOW (ref 0.40–4.50)

## 2019-09-05 MED ORDER — PREGABALIN 100 MG PO CAPS: ORAL_CAPSULE | ORAL | 0 refills | Status: DC

## 2019-09-05 MED ORDER — MELOXICAM 15 MG PO TABS: 15.0000 mg | ORAL_TABLET | Freq: Every day | ORAL | 0 refills | Status: DC

## 2019-09-05 NOTE — Progress Notes (Signed)
Name: Brittany Hill   MRN: [id number]    DOB: July 03, 1951   Date:09/05/2019       Progress Note  Chief Complaint  Patient presents with  . Hypothyroidism  . Neck Pain     Subjective:   Brittany Hill is a 69 y.o. female, presents to clinic for routine follow up on the conditions listed above.  Hypothyroid - taking levothyroxine 100 mcg for 6/7 d a week and one day 1/2 dose.  She felt better on the day with the lower dose when we first decreased meds, but she feels things have "leveled off" and she feel good in general now. Her palpitations have resolved.  She denies any swelling, weight changes, diarrhea, constipation, hair or skin changes, mood or energy changes. Lab Results  Component Value Date   TSH 0.28 (L) 07/11/2019  Last TSH was very low, she is due for repeat of TSH today after dose change.  Pain management - doing well with lyrica 100 qam and 200 qam and cymbalta 40 mg daily.  Taking the Lyrica at this higher doses allowed her to participate in her home exercise program given to her after her recent neck surgery.    Her other chronic conditions were just addressed last month with labs done, not needed to follow-up on them today we will have her return in several months to do her routine follow-up on everything.  Patient Active Problem List   Diagnosis Date Noted  . Displacement of cervical intervertebral disc 05/14/2019  . Fatty liver disease, nonalcoholic 03/28/2019  . Osteopenia 10/20/2018  . Obesity (BMI 30.0-34.9) 09/27/2018  . Cataract, left 04/02/2018  . Osteoarthritis of left ankle 02/16/2018  . Asthma without status asthmaticus 10/05/2017  . Genetic testing 08/28/2017  . Family hx-breast malignancy 08/02/2017  . Dense breast tissue on mammogram 07/28/2016  . Left sided sciatica 12/19/2015  . Left lumbar radiculopathy 12/16/2015  . Hypomagnesemia 12/06/2015  . Hypokalemia 12/02/2015  . Fibromyalgia   . MS (multiple sclerosis) (HCC)   . Chronic  constipation   . OA (osteoarthritis)   . Hypothyroidism   . IFG (impaired fasting glucose)   . Hypertension   . Hyperlipidemia   . GERD (gastroesophageal reflux disease)   . MDD (major depressive disorder), recurrent, in full remission (HCC)   . Anxiety   . TMJ syndrome     Past Surgical History:  Procedure Laterality Date  . ABDOMINAL HYSTERECTOMY     complete at age 77; HRT for ~25y  . BACK SURGERY    . CATARACT EXTRACTION W/PHACO Left 09/27/2017   Procedure: CATARACT EXTRACTION PHACO AND INTRAOCULAR LENS PLACEMENT (IOC);  Surgeon: Nevada Crane, MD;  Location: [facility] ORS;  Service: Ophthalmology;  Laterality: Left;  Lot #[id number] H [place]:   00:27.2 AP%:  13.7 CDE:  3.75  . CATARACT EXTRACTION W/PHACO Right 10/25/2017   Procedure: CATARACT EXTRACTION PHACO AND INTRAOCULAR LENS PLACEMENT (IOC);  Surgeon: Nevada Crane, MD;  Location: [facility] ORS;  Service: Ophthalmology;  Laterality: Right;  Lot # [id number] H [place]:   00:23.9 AP%   :8.7 CDE:   2.10  . Cataracts Bilateral    09/27/17 and 10/25/17  . COLONOSCOPY WITH ESOPHAGOGASTRODUODENOSCOPY (EGD)  12/2017   [facility]  . JOINT REPLACEMENT    . KNEE ARTHROSCOPY Right    x 2  . lumbarectomy with fusion  [id number]  . ROTATOR CUFF REPAIR    . tear duct      surgery  . TMJ ARTHROPLASTY    .  TOTAL KNEE ARTHROPLASTY Bilateral 09/2007    Family History  Problem Relation Age of Onset  . Heart disease Mother   . Hypertension Mother   . Lymphoma Mother 21       deceased 61  . Stroke Father   . Heart attack Father   . Heart disease Father   . Hypertension Father   . Alcohol abuse Father   . Hypertension Sister   . Colon cancer Sister 22  . Hypertension Brother   . Diabetes Brother   . Mental illness Brother   . Depression Brother   . Lung cancer Brother 32       smoker; currently 28  . Alcohol abuse Son   . Breast cancer Maternal Aunt 75       currently 44  . Breast cancer Maternal Aunt        dx 18s; mets in  31s  . AAA (abdominal aortic aneurysm) Maternal Grandmother   . Heart disease Maternal Grandmother   . Heart attack Paternal Grandfather   . Throat cancer Paternal Uncle        smoker    Social History   Socioeconomic History  . Marital status: Married    Spouse name: Dorene Sorrow  . Number of children: 2  . Years of education: Not on file  . Highest education level: Master's degree (e.g., MA, MS, MEng, MEd, MSW, MBA)  Occupational History  . Occupation: retired  Tobacco Use  . Smoking status: Never Smoker  . Smokeless tobacco: Never Used  Substance and Sexual Activity  . Alcohol use: No    Alcohol/week: 0.0 standard drinks  . Drug use: No  . Sexual activity: Not Currently  Other Topics Concern  . Not on file  Social History Narrative  . Not on file   Social Determinants of Health   Financial Resource Strain: Low Risk   . Difficulty of Paying Living Expenses: Not hard at all  Food Insecurity: No Food Insecurity  . Worried About Programme researcher, broadcasting/film/video in the Last Year: Never true  . Ran Out of Food in the Last Year: Never true  Transportation Needs: No Transportation Needs  . Lack of Transportation (Medical): No  . Lack of Transportation (Non-Medical): No  Physical Activity: Inactive  . Days of Exercise per Week: 0 days  . Minutes of Exercise per Session: 0 min  Stress: Stress Concern Present  . Feeling of Stress : To some extent  Social Connections: Slightly Isolated  . Frequency of Communication with Friends and Family: More than three times a week  . Frequency of Social Gatherings with Friends and Family: More than three times a week  . Attends Religious Services: More than 4 times per year  . Active Member of Clubs or Organizations: No  . Attends Banker Meetings: Never  . Marital Status: Married  Catering manager Violence: Not At Risk  . Fear of Current or Ex-Partner: No  . Emotionally Abused: No  . Physically Abused: No  . Sexually Abused: No      Current Outpatient Medications:  .  acetaminophen (TYLENOL) 500 MG tablet, Take 1,000 mg by mouth every 6 (six) hours as needed., Disp: , Rfl:  .  calcium carbonate (CALCIUM 600) 600 MG TABS tablet, Take 600 mg by mouth as needed. , Disp: , Rfl:  .  DULoxetine (CYMBALTA) 20 MG capsule, Take 2 capsules (40 mg total) by mouth daily., Disp: 180 capsule, Rfl: 3 .  EPINEPHrine 0.3 mg/0.3 mL  IJ SOAJ injection, Inject 0.3 mg into the muscle once. , Disp: , Rfl:  .  ezetimibe-simvastatin (VYTORIN) 10-40 MG tablet, Take 1 tablet by mouth at bedtime., Disp: 90 tablet, Rfl: 1 .  fluticasone (FLONASE) 50 MCG/ACT nasal spray, Place 2 sprays into both nostrils daily., Disp: , Rfl:  .  ibuprofen (ADVIL) 200 MG tablet, Take 400 mg by mouth every 8 (eight) hours as needed., Disp: , Rfl:  .  levothyroxine (SYNTHROID) 100 MCG tablet, Take 1 tablet (100 mcg total) by mouth daily before breakfast., Disp: 90 tablet, Rfl: 1 .  meloxicam (MOBIC) 15 MG tablet, Take 0.5-1 tablets (7.5-15 mg total) by mouth daily., Disp: 30 tablet, Rfl: 0 .  metFORMIN (GLUCOPHAGE-XR) 500 MG 24 hr tablet, Take 1 tablet (500 mg total) by mouth 2 (two) times daily., Disp: 180 tablet, Rfl: 1 .  metoprolol succinate (TOPROL-XL) 50 MG 24 hr tablet, Take 1 tablet (50 mg total) by mouth at bedtime. Take with or immediately following a meal., Disp: , Rfl:  .  omeprazole (PRILOSEC) 40 MG capsule, Take 1 capsule (40 mg total) by mouth daily., Disp: 90 capsule, Rfl: 0 .  pregabalin (LYRICA) 100 MG capsule, Take 100 mg po QAM and 200 mg PO QPM daily for chronic pain, Disp: 90 capsule, Rfl: 2 .  spironolactone (ALDACTONE) 50 MG tablet, Take 50 mg by mouth daily. With breakfast, Disp: , Rfl:  .  tiZANidine (ZANAFLEX) 4 MG tablet, Take 1-1.5 tablets (4-6 mg total) by mouth every 8 (eight) hours as needed for muscle spasms., Disp: 90 tablet, Rfl: 2 .  Vitamin D, Cholecalciferol, 50 MCG (2000 UT) CAPS, Take 2,000 Units by mouth daily. , Disp: , Rfl:  .   pregabalin (LYRICA) 50 MG capsule, Take 1 capsule (50 mg total) by mouth 3 (three) times daily. (Patient taking differently: Take 50 mg by mouth 3 (three) times daily. Pt taking 50 mg qam and 100 mg qhs), Disp: 90 capsule, Rfl: 0  Allergies  Allergen Reactions  . Shellfish Allergy Rash and Swelling  . Codeine Nausea And Vomiting  . Sulfa Antibiotics Swelling    Chart Review Today: I personally reviewed active problem list, medication list, allergies, family history, social history, health maintenance, notes from last encounter, lab results, imaging with the patient/caregiver today.   Review of Systems  Constitutional: Negative.   HENT: Negative.   Eyes: Negative.   Respiratory: Negative.   Cardiovascular: Negative.   Gastrointestinal: Negative.   Endocrine: Negative.   Genitourinary: Negative.   Musculoskeletal: Negative.   Skin: Negative.   Allergic/Immunologic: Negative.   Neurological: Negative.   Hematological: Negative.   Psychiatric/Behavioral: Negative.   All other systems reviewed and are negative.    Objective:    Vitals:   09/05/19 0949  BP: 122/70  Pulse: 83  Resp: 12  Temp: 98.1 F (36.7 C)  SpO2: 97%  Weight: 179 lb 8 oz (81.4 kg)  Height: 5\' 4"  (1.626 m)    Body mass index is 30.81 kg/m.  Physical Exam Vitals and nursing note reviewed.  Constitutional:      General: She is not in acute distress.    Appearance: Normal appearance. She is well-developed. She is not ill-appearing, toxic-appearing or diaphoretic.     Interventions: Face mask in place.     Comments: Well-appearing  HENT:     Head: Normocephalic and atraumatic.     Right Ear: External ear normal.     Left Ear: External ear normal.  Eyes:  General: Lids are normal. No scleral icterus.       Right eye: No discharge.        Left eye: No discharge.     Conjunctiva/sclera: Conjunctivae normal.  Neck:     Trachea: Phonation normal. No tracheal deviation.  Cardiovascular:      Rate and Rhythm: Normal rate and regular rhythm.     Pulses: Normal pulses.          Radial pulses are 2+ on the right side and 2+ on the left side.       Posterior tibial pulses are 2+ on the right side and 2+ on the left side.     Heart sounds: Normal heart sounds. No murmur. No friction rub. No gallop.   Pulmonary:     Effort: Pulmonary effort is normal. No respiratory distress.     Breath sounds: Normal breath sounds. No stridor. No wheezing, rhonchi or rales.  Chest:     Chest wall: No tenderness.  Abdominal:     General: Bowel sounds are normal. There is no distension.     Palpations: Abdomen is soft.     Tenderness: There is no abdominal tenderness. There is no guarding or rebound.  Musculoskeletal:        General: No deformity. Normal range of motion.     Cervical back: Normal range of motion and neck supple.     Right lower leg: No edema.     Left lower leg: No edema.  Lymphadenopathy:     Cervical: No cervical adenopathy.  Skin:    General: Skin is warm and dry.     Capillary Refill: Capillary refill takes less than 2 seconds.     Coloration: Skin is not jaundiced or pale.     Findings: No rash.  Neurological:     Mental Status: She is alert and oriented to person, place, and time.     Motor: No abnormal muscle tone.     Gait: Gait normal.  Psychiatric:        Speech: Speech normal.        Behavior: Behavior normal.       Diabetic Foot Exam: Diabetic Foot Exam - Simple   No data filed      PHQ2/9: Depression screen [facility] 2/9 09/05/2019 08/22/2019 08/14/2019 07/09/2019 05/02/2019  Decreased Interest 0 0 0 0 0  Down, Depressed, Hopeless 0 0 0 0 1  PHQ - 2 Score 0 0 0 0 1  Altered sleeping 1 1 - 0 2  Tired, decreased energy 1 1 - 0 1  Change in appetite 0 0 - 0 1  Feeling bad or failure about yourself  0 0 - 0 0  Trouble concentrating 0 0 - 0 0  Moving slowly or fidgety/restless 0 0 - 0 0  Suicidal thoughts 0 0 - 0 0  PHQ-9 Score 2 2 - 0 5  Difficult doing  work/chores Not difficult at all Not difficult at all - Not difficult at all Not difficult at all  Some recent data might be hidden    phq 9 is neg, reviewed  Fall Risk: Fall Risk  09/05/2019 08/14/2019 07/09/2019 05/02/2019 03/28/2019  Falls in the past year? 0 0 0 0 0  Number falls in past yr: 0 0 0 0 0  Injury with Fall? 0 0 0 0 0  Risk for fall due to : - No Fall Risks - - -  Follow up - Falls prevention discussed - Falls  evaluation completed -    Functional Status Survey: Is the patient deaf or have difficulty hearing?: No Does the patient have difficulty seeing, even when wearing glasses/contacts?: No Does the patient have difficulty concentrating, remembering, or making decisions?: No Does the patient have difficulty walking or climbing stairs?: No Does the patient have difficulty dressing or bathing?: No Does the patient have difficulty doing errands alone such as visiting a doctor's office or shopping?: No   Assessment & Plan:    1. Hypothyroidism, unspecified type Recheck TSH today Adjust dose of levothyroxine as needed    All pt's pain dx:   She appears much better today than previous appointments, she looks healthier, happier, does not have the appearance that she is in constant pain and her movement and mobility looks much better as well.  We will continue to manage her pain with maximum dose of Lyrica 100 mg in the morning and 200 mg in the evening, Mobic and muscle relaxers, she is continuing her home exercise program 2. Chronic post-operative pain 3. Neck pain 4. Chronic back pain, unspecified back location, unspecified back pain laterality 5. Cervical radiculopathy 6. Fibromyalgia   Return in about 4 months (around 01/03/2020) for Routine follow-up.   Danelle Berry, PA-C 09/05/19 10:09 AM

## 2019-09-09 NOTE — Addendum Note (Signed)
Addended by: Danelle Berry on: 09/09/2019 10:21 AM   Modules accepted: Orders

## 2019-10-08 ENCOUNTER — Telehealth: Payer: Self-pay | Admitting: Family Medicine

## 2019-10-08 NOTE — Telephone Encounter (Signed)
Copied from CRM [id number]. Topic: General - Other >> Oct 08, 2019  5:02 PM Laural Benes, [place] C wrote: Reason for CRM: pt called in to be advised / scheduling. Pt say that she had a visit with Annye Asa and was prescribed some medication for allergies. Pt says that she feels that it has now turned into bronchitis and would like to know what should she do? Please assist.

## 2019-10-09 ENCOUNTER — Ambulatory Visit (INDEPENDENT_AMBULATORY_CARE_PROVIDER_SITE_OTHER): Payer: Medicare PPO | Admitting: Internal Medicine

## 2019-10-09 ENCOUNTER — Encounter: Payer: Self-pay | Admitting: Internal Medicine

## 2019-10-09 VITALS — BP 120/71 | Ht 64.0 in | Wt 177.0 lb

## 2019-10-09 DIAGNOSIS — J40 Bronchitis, not specified as acute or chronic: Secondary | ICD-10-CM | POA: Diagnosis not present

## 2019-10-09 MED ORDER — AZITHROMYCIN 250 MG PO TABS: ORAL_TABLET | ORAL | 0 refills | Status: DC

## 2019-10-09 NOTE — Telephone Encounter (Signed)
Pt made today with Dorris Fetch

## 2019-10-09 NOTE — Telephone Encounter (Signed)
Please schedule virtual visit soon or she will need to go to urgent care

## 2019-10-09 NOTE — Progress Notes (Signed)
Name: Brittany Hill   MRN: [id number]    DOB: Mar 24, 1951   Date:10/09/2019       Progress Note  Subjective  Chief Complaint  Chief Complaint  Patient presents with  . Cough    coughing up "green mess"  . Headache    I connected with  [name]  on 10/09/19 at  2:00 PM EST by a video enabled telemedicine application and verified that I am speaking with the correct person using two identifiers.  I discussed the limitations of evaluation and management by telemedicine and the availability of in person appointments. The patient expressed understanding and agreed to proceed. Staff also discussed with the patient that there may be a patient responsible charge related to this service. Patient Location: Home Provider Location: [facility] Additional Individuals present: none Video call got interrupted due to a poor connection at about 4 minutes, and finished with the phone call.  HPI Patient is a 69 year old female with multiple medical problems in the active problem list as noted, and recently saw cardiology for follow-up of her atypical chest pain which is not worse with physical exertion and continuing to monitor, and also recently had cervical spine surgery with her pain recently noted to be controlled with Lyrica and Cymbalta on a recent visit with Danelle Berry who presents today with thinks have bronchitis  + cough X 2 weeks, + production of green mucus several times a day, no blood except when blows nose, and then mucus is bloody, minimal sinus pain - HA and dull, had surgery years ago, + chest congestion and tight feeling No marked SOB, at times catching breath No fever,  Min sore throat.  No loss of smell, loss of taste No N/V Min muscle aches No marked loose stools/diarrhea No increasing CP,  No confusion, passing out episodes Getting worse in the last few days Had 2nd covid vaccine Feb 10, no covid tests recent past, no covid exposures Comorbid conditions reviewed + asthma  teenager and young adult and resolved post allergy/No COPD hx,  No h/o DM, CKD, Has been using alb inhaler and helpful, cough syrup - promethazine DM, zyrtec   Never smoker Patient Active Problem List   Diagnosis Date Noted  . Chronic post-operative pain 09/05/2019  . Neck pain 09/05/2019  . Cervical radiculopathy 09/05/2019  . Displacement of cervical intervertebral disc 05/14/2019  . Fatty liver disease, nonalcoholic 03/28/2019  . Osteopenia 10/20/2018  . Obesity (BMI 30.0-34.9) 09/27/2018  . Cataract, left 04/02/2018  . Osteoarthritis of left ankle 02/16/2018  . Asthma without status asthmaticus 10/05/2017  . Genetic testing 08/28/2017  . Family hx-breast malignancy 08/02/2017  . Dense breast tissue on mammogram 07/28/2016  . Left sided sciatica 12/19/2015  . Left lumbar radiculopathy 12/16/2015  . Hypomagnesemia 12/06/2015  . Hypokalemia 12/02/2015  . Fibromyalgia   . MS (multiple sclerosis) (HCC)   . Chronic constipation   . OA (osteoarthritis)   . Hypothyroidism   . IFG (impaired fasting glucose)   . Hypertension   . Hyperlipidemia   . GERD (gastroesophageal reflux disease)   . MDD (major depressive disorder), recurrent, in full remission (HCC)   . Anxiety   . TMJ syndrome     Past Surgical History:  Procedure Laterality Date  . ABDOMINAL HYSTERECTOMY     complete at age 80; HRT for ~25y  . BACK SURGERY    . CATARACT EXTRACTION W/PHACO Left 09/27/2017   Procedure: CATARACT EXTRACTION PHACO AND INTRAOCULAR LENS PLACEMENT (IOC);  Surgeon: Nevada Crane, MD;  Location: [facility] ORS;  Service: Ophthalmology;  Laterality: Left;  Lot #[id number] H [place]:   00:27.2 AP%:  13.7 CDE:  3.75  . CATARACT EXTRACTION W/PHACO Right 10/25/2017   Procedure: CATARACT EXTRACTION PHACO AND INTRAOCULAR LENS PLACEMENT (IOC);  Surgeon: Nevada Crane, MD;  Location: [facility] ORS;  Service: Ophthalmology;  Laterality: Right;  Lot # [id number] H [place]:   00:23.9 AP%   :8.7 CDE:   2.10  . Cataracts  Bilateral    09/27/17 and 10/25/17  . COLONOSCOPY WITH ESOPHAGOGASTRODUODENOSCOPY (EGD)  12/2017   [facility]  . JOINT REPLACEMENT    . KNEE ARTHROSCOPY Right    x 2  . lumbarectomy with fusion  [id number]  . ROTATOR CUFF REPAIR    . tear duct      surgery  . TMJ ARTHROPLASTY    . TOTAL KNEE ARTHROPLASTY Bilateral 09/2007    Family History  Problem Relation Age of Onset  . Heart disease Mother   . Hypertension Mother   . Lymphoma Mother 27       deceased 7  . Stroke Father   . Heart attack Father   . Heart disease Father   . Hypertension Father   . Alcohol abuse Father   . Hypertension Sister   . Colon cancer Sister 22  . Hypertension Brother   . Diabetes Brother   . Mental illness Brother   . Depression Brother   . Lung cancer Brother 64       smoker; currently 55  . Alcohol abuse Son   . Breast cancer Maternal Aunt 75       currently 55  . Breast cancer Maternal Aunt        dx 82s; mets in 23s  . AAA (abdominal aortic aneurysm) Maternal Grandmother   . Heart disease Maternal Grandmother   . Heart attack Paternal Grandfather   . Throat cancer Paternal Uncle        smoker    Social History   Tobacco Use  . Smoking status: Never Smoker  . Smokeless tobacco: Never Used  Substance Use Topics  . Alcohol use: No    Alcohol/week: 0.0 standard drinks     Current Outpatient Medications:  .  acetaminophen (TYLENOL) 500 MG tablet, Take 1,000 mg by mouth every 6 (six) hours as needed., Disp: , Rfl:  .  albuterol (VENTOLIN HFA) 108 (90 Base) MCG/ACT inhaler, Inhale into the lungs every 6 (six) hours as needed for wheezing or shortness of breath., Disp: , Rfl:  .  calcium carbonate (CALCIUM 600) 600 MG TABS tablet, Take 600 mg by mouth as needed. , Disp: , Rfl:  .  cetirizine (ZYRTEC) 10 MG tablet, Take 10 mg by mouth daily., Disp: , Rfl:  .  DULoxetine (CYMBALTA) 20 MG capsule, Take 2 capsules (40 mg total) by mouth daily., Disp: 180 capsule, Rfl: 3 .   ezetimibe-simvastatin (VYTORIN) 10-40 MG tablet, Take 1 tablet by mouth at bedtime., Disp: 90 tablet, Rfl: 1 .  ibuprofen (ADVIL) 200 MG tablet, Take 400 mg by mouth every 8 (eight) hours as needed., Disp: , Rfl:  .  levothyroxine (SYNTHROID) 100 MCG tablet, Take 1 tablet (100 mcg total) by mouth daily before breakfast., Disp: 90 tablet, Rfl: 1 .  meloxicam (MOBIC) 15 MG tablet, Take 1 tablet (15 mg total) by mouth daily., Disp: 90 tablet, Rfl: 0 .  metFORMIN (GLUCOPHAGE-XR) 500 MG 24 hr tablet, Take 1 tablet (500 mg total) by  mouth 2 (two) times daily., Disp: 180 tablet, Rfl: 1 .  metoprolol succinate (TOPROL-XL) 50 MG 24 hr tablet, Take 1 tablet (50 mg total) by mouth at bedtime. Take with or immediately following a meal., Disp: , Rfl:  .  omeprazole (PRILOSEC) 40 MG capsule, Take 1 capsule (40 mg total) by mouth daily., Disp: 90 capsule, Rfl: 0 .  pregabalin (LYRICA) 100 MG capsule, Take 100 mg po QAM and 200 mg PO QPM daily for chronic pain, Disp: 90 capsule, Rfl: 0 .  [START ON 10/16/2019] pregabalin (LYRICA) 100 MG capsule, Take 100 mg po QAM and 200 mg PO QPM daily for chronic pain, Disp: 90 capsule, Rfl: 0 .  PROMETHAZINE-DM PO, Take by mouth., Disp: , Rfl:  .  spironolactone (ALDACTONE) 50 MG tablet, Take 50 mg by mouth daily. With breakfast, Disp: , Rfl:  .  tiZANidine (ZANAFLEX) 4 MG tablet, Take 1-1.5 tablets (4-6 mg total) by mouth every 8 (eight) hours as needed for muscle spasms., Disp: 90 tablet, Rfl: 2 .  Vitamin D, Cholecalciferol, 50 MCG (2000 UT) CAPS, Take 2,000 Units by mouth daily. , Disp: , Rfl:  .  EPINEPHrine 0.3 mg/0.3 mL IJ SOAJ injection, Inject 0.3 mg into the muscle once. , Disp: , Rfl:  .  fluticasone (FLONASE) 50 MCG/ACT nasal spray, Place 2 sprays into both nostrils daily., Disp: , Rfl:   Allergies  Allergen Reactions  . Shellfish Allergy Rash and Swelling  . Codeine Nausea And Vomiting  . Sulfa Antibiotics Swelling    With staff assistance, above reviewed with  the patient today.   ROS: As per HPI, otherwise no specific complaints on a limited and focused system review   Objective  Virtual encounter, vitals not obtained.  Body mass index is 30.38 kg/m.  Physical Exam  Patient appears in NAD, occasional cough on call, pleasant Pulmonary/Chest: Effort normal. No respiratory distress. Speaking in complete sentences Neurological: Pt is alert and oriented,  Speech is normal.  Psychiatric: Patient has a normal mood and affect, behavior is normal. Very appropriate with conversation, judgment and thought content normal.   No results found for this or any previous visit (from the past 72 hour(s)).  PHQ2/9: Depression screen [facility] 2/9 10/09/2019 09/05/2019 08/22/2019 08/14/2019 07/09/2019  Decreased Interest 0 0 0 0 0  Down, Depressed, Hopeless 0 0 0 0 0  PHQ - 2 Score 0 0 0 0 0  Altered sleeping 1 1 1  - 0  Tired, decreased energy 1 1 1  - 0  Change in appetite 0 0 0 - 0  Feeling bad or failure about yourself  0 0 0 - 0  Trouble concentrating 0 0 0 - 0  Moving slowly or fidgety/restless 0 0 0 - 0  Suicidal thoughts 0 0 0 - 0  PHQ-9 Score 2 2 2  - 0  Difficult doing work/chores Not difficult at all Not difficult at all Not difficult at all - Not difficult at all  Some recent data might be hidden   PHQ-2/9 Result reviewed - neg  Fall Risk: Fall Risk  10/09/2019 09/05/2019 08/14/2019 07/09/2019 05/02/2019  Falls in the past year? 0 0 0 0 0  Number falls in past yr: 0 0 0 0 0  Injury with Fall? 0 0 0 0 0  Risk for fall due to : - - No Fall Risks - -  Follow up - - Falls prevention discussed - Falls evaluation completed     Assessment & Plan  1. Bronchitis  Discussed that her symptoms are seemingly most consistent with a bronchitis.  Cannot completely exclude Covid at this time, although reassuring she has had the vaccine, also not losing her taste or smell.  Discussed potentially getting a test for Covid acutely.  Mutually agreed to manage for bronchitis  acutely, and if not improving potentially moving forward with that. We will add an antibiotic, azithromycin, will take to the first day then 1 a day for 4 more days - azithromycin (ZITHROMAX) 250 MG tablet; Take as directed  Dispense: 6 tablet; Refill: 0 Also continue the albuterol inhaler, can take 3 times a day routinely, and use more as needed as improving.  This will help with clearance Recommended also continuing the cough syrup she has as needed. Can also take a plain Mucinex or Robitussin type product as an expectorant to help. Symptomatic measures with rest and staying well-hydrated recommended.  She is to follow-up if not improving or more problematic.  Also emphasized if more severe symptoms arise with shortness of breath, higher fevers, increasing chest pains, she needs to go to the emergency room to be seen and she was understanding of that.  I discussed the assessment and treatment plan with the patient. The patient was provided an opportunity to ask questions and all were answered. The patient agreed with the plan and demonstrated an understanding of the instructions.  The patient was advised to call back or seek an in-person evaluation if the symptoms worsen or if the condition fails to improve as anticipated.  I provided 20 minutes of non-face-to-face time during this encounter that included discussing at length patient's sx/history, pertinent pmhx, medications, treatment and follow up plan. This time also included the necessary documentation, orders, and chart review.

## 2019-10-21 ENCOUNTER — Telehealth: Payer: Self-pay

## 2019-10-21 ENCOUNTER — Other Ambulatory Visit: Payer: Self-pay | Admitting: Physician Assistant

## 2019-10-21 DIAGNOSIS — R059 Cough, unspecified: Secondary | ICD-10-CM

## 2019-10-21 DIAGNOSIS — J40 Bronchitis, not specified as acute or chronic: Secondary | ICD-10-CM

## 2019-10-21 DIAGNOSIS — J4521 Mild intermittent asthma with (acute) exacerbation: Secondary | ICD-10-CM

## 2019-10-21 DIAGNOSIS — J324 Chronic pansinusitis: Secondary | ICD-10-CM

## 2019-10-21 DIAGNOSIS — R05 Cough: Secondary | ICD-10-CM

## 2019-10-21 MED ORDER — PREDNISONE 20 MG PO TABS: 40.0000 mg | ORAL_TABLET | Freq: Every day | ORAL | 0 refills | Status: AC

## 2019-10-21 MED ORDER — MONTELUKAST SODIUM 10 MG PO TABS: 10.0000 mg | ORAL_TABLET | Freq: Every day | ORAL | 3 refills | Status: DC

## 2019-10-21 MED ORDER — ALBUTEROL SULFATE HFA 108 (90 BASE) MCG/ACT IN AERS: 2.0000 | INHALATION_SPRAY | RESPIRATORY_TRACT | 1 refills | Status: DC | PRN

## 2019-10-21 MED ORDER — BENZONATATE 100 MG PO CAPS: 100.0000 mg | ORAL_CAPSULE | Freq: Three times a day (TID) | ORAL | 0 refills | Status: DC | PRN

## 2019-10-21 NOTE — Telephone Encounter (Signed)
Is it ok to schedule her?

## 2019-10-21 NOTE — Telephone Encounter (Signed)
Copied from CRM [id number]. Topic: General - Other >> Oct 20, 2019  8:58 AM Brittany Hill wrote: Reason for CRM: Patient called to say that after 2 virtual visits she still have Hill cough and feel something deep in her chest. She would like to come in to be seen and evaluated in person Please advise. Ph# [phone] >> Oct 21, 2019  3:48 PM Brittany Hill wrote: Patient called to inform Dr Angelica Chessman that she went to see the ENT and was told that the issues that she is having is in the chest and not the sinuses. Per patient states that they will do Hill CT of her sinus just to make seur that is not what is causing the issues. Please call  Ph# [phone]

## 2019-10-22 NOTE — Telephone Encounter (Signed)
Tried to call pt, no answer and no option for VM

## 2019-10-22 NOTE — Telephone Encounter (Signed)
Please schedule her a in person visit

## 2019-10-24 ENCOUNTER — Encounter: Payer: Self-pay | Admitting: Family Medicine

## 2019-10-24 ENCOUNTER — Other Ambulatory Visit: Payer: Self-pay

## 2019-10-24 ENCOUNTER — Ambulatory Visit: Payer: Medicare PPO | Admitting: Family Medicine

## 2019-10-24 ENCOUNTER — Ambulatory Visit
Admission: RE | Admit: 2019-10-24 | Discharge: 2019-10-24 | Disposition: A | Discharge status: Home / Self Care | Payer: Medicare PPO | Source: Ambulatory Visit | Attending: Family Medicine | Admitting: Family Medicine

## 2019-10-24 VITALS — BP 140/78 | HR 101 | Temp 98.1°F | Resp 14 | Ht 64.0 in | Wt 178.0 lb

## 2019-10-24 DIAGNOSIS — R1013 Epigastric pain: Secondary | ICD-10-CM | POA: Diagnosis not present

## 2019-10-24 DIAGNOSIS — J4541 Moderate persistent asthma with (acute) exacerbation: Secondary | ICD-10-CM | POA: Diagnosis not present

## 2019-10-24 DIAGNOSIS — R05 Cough: Secondary | ICD-10-CM | POA: Diagnosis present

## 2019-10-24 DIAGNOSIS — R059 Cough, unspecified: Secondary | ICD-10-CM

## 2019-10-24 DIAGNOSIS — J4521 Mild intermittent asthma with (acute) exacerbation: Secondary | ICD-10-CM

## 2019-10-24 MED ORDER — IPRATROPIUM-ALBUTEROL 0.5-2.5 (3) MG/3ML IN SOLN: 3.0000 mL | Freq: Three times a day (TID) | RESPIRATORY_TRACT | 1 refills | Status: DC | PRN

## 2019-10-24 MED ORDER — HYDROCOD POLST-CPM POLST ER 10-8 MG/5ML PO SUER: 5.0000 mL | Freq: Two times a day (BID) | ORAL | 0 refills | Status: DC | PRN

## 2019-10-24 MED ORDER — OMEPRAZOLE 40 MG PO CPDR: 40.0000 mg | DELAYED_RELEASE_CAPSULE | Freq: Every day | ORAL | 0 refills | Status: DC

## 2019-10-24 MED ORDER — NEBULIZER/TUBING/MOUTHPIECE KIT: PACK | 0 refills | Status: DC

## 2019-10-24 NOTE — Progress Notes (Signed)
Patient ID: Brittany Hill, female    DOB: Jul 02, 1951, 69 y.o.   MRN: [id number]  PCP: Delsa Grana, PA-C  Chief Complaint  Patient presents with  . Cough    on going  . Gastroesophageal Reflux    Subjective:   Brittany Hill is a 69 y.o. female, presents to clinic with CC of the following:  HPI  Pt presents for continued productive cough and wheeze. Recently seen by colleague here for same, appt was about 2 weeks ago, HPI copied from Dr. Roxan Hockey: Patient is a 69 year old female with multiple medical problems in the active problem list as noted, and recently saw cardiology for follow-up of her atypical chest pain which is not worse with physical exertion and continuing to monitor, and also recently had cervical spine surgery with her pain recently noted to be controlled with Lyrica and Cymbalta on a recent visit with Delsa Grana who presents today with thinks have bronchitis  + cough X 2 weeks, + production of green mucus several times a day, no blood except when blows nose, and then mucus is bloody, minimal sinus pain - HA and dull, had surgery years ago, + chest congestion and tight feeling No marked SOB, at times catching breath No fever,  Min sore throat.  No loss of smell, loss of taste No N/V Min muscle aches No marked loose stools/diarrhea No increasing CP,  No confusion, passing out episodes Getting worse in the last few days Had 2nd covid vaccine Feb 10, no covid tests recent past, no covid exposures Comorbid conditions reviewed + asthma teenager and young adult and resolved post allergy/No COPD hx,  No h/o DM, CKD, Has been using alb inhaler and helpful, cough syrup - promethazine DM, zyrtec   Never smoker  She called [place] a few days ago complaining of continued sx despite tx with Zpak and cough meds by Dr. Lemmie Evens.  She has a hx of asthmatic bronchitis and multiple allergies. She had some improvement of her cough while she was taking a Z-Pak she states  that her sputum became less thick and it stopped appearing green after she was completed and again became more congested in her chest more purulent and green in appearance.  She endorses generalized fatigue malaise, frequent coughing with wheeze and shortness of breath also experiencing night sweats though she continues to be afebrile.  She ran out of cough medication and she is only using her albuterol inhaler 2 puffs every 6 hours  She called [place] 3 days ago and messages were taken by the call center and routed to [place]: Copied from [place] [phone]. Topic: General - Other >> Oct 20, 2019  8:58 AM Leward Quan A wrote: Reason for CRM: Patient called to say that after 2 virtual visits she still have a cough and feel something deep in her chest. She would like to come in to be seen and evaluated in person Please advise. Ph# [phone] >> Oct 21, 2019  3:48 PM Leward Quan A wrote: Patient called to inform Dr Lucio Edward that she went to see the ENT and was told that the issues that she is having is in the chest and not the sinuses. Per patient states that they will do a CT of her sinus just to make seur that is not what is causing the issues. Please call  Ph# [phone]  I responded to inquiry about pt asking to be seen in person- Comment: Yes I am comfortable seeing her in person she  has been seen multiple times she has both Covid vaccinations  I am going to put in a chest x-ray and send in some steroids for her to take she should also use Mucinex and her inhaler more frequently and we can check on her in person as soon as we can squeeze her in for an appointment  She was able to schedule an appt, but unfortunately she was unable to see my comments or plan regarding the medicines prescribed and the chest x-ray.  We reviewed those today in the exam room.  Patient continues to deny any fever, sore throat, body aches, near syncope, palpitations, lower extremity edema, exertional chest pain or pressure.  She reports  that she is extremely fatigued but her husband also just had knee surgery and she is not sleeping well with her coughing and hearing him get up at night.   Patient Active Problem List   Diagnosis Date Noted  . Chronic post-operative pain 09/05/2019  . Neck pain 09/05/2019  . Cervical radiculopathy 09/05/2019  . Displacement of cervical intervertebral disc 05/14/2019  . Fatty liver disease, nonalcoholic 41/28/7867  . Osteopenia 10/20/2018  . Obesity (BMI 30.0-34.9) 09/27/2018  . Cataract, left 04/02/2018  . Osteoarthritis of left ankle 02/16/2018  . Asthma without status asthmaticus 10/05/2017  . Genetic testing 08/28/2017  . Family hx-breast malignancy 08/02/2017  . Dense breast tissue on mammogram 07/28/2016  . Left sided sciatica 12/19/2015  . Left lumbar radiculopathy 12/16/2015  . Hypomagnesemia 12/06/2015  . Hypokalemia 12/02/2015  . Fibromyalgia   . MS (multiple sclerosis) ([place])   . Chronic constipation   . OA (osteoarthritis)   . Hypothyroidism   . IFG (impaired fasting glucose)   . Hypertension   . Hyperlipidemia   . GERD (gastroesophageal reflux disease)   . MDD (major depressive disorder), recurrent, in full remission ([place])   . Anxiety   . TMJ syndrome       Current Outpatient Medications:  .  acetaminophen (TYLENOL) 500 MG tablet, Take 1,000 mg by mouth every 6 (six) hours as needed., Disp: , Rfl:  .  albuterol (VENTOLIN HFA) 108 (90 Base) MCG/ACT inhaler, Inhale 2 puffs into the lungs every 4 (four) hours as needed for wheezing or shortness of breath., Disp: 18 g, Rfl: 1 .  benzonatate (TESSALON) 100 MG capsule, Take 1 capsule (100 mg total) by mouth 3 (three) times daily as needed for cough., Disp: 30 capsule, Rfl: 0 .  calcium carbonate (CALCIUM 600) 600 MG TABS tablet, Take 600 mg by mouth as needed. , Disp: , Rfl:  .  cetirizine (ZYRTEC) 10 MG tablet, Take 10 mg by mouth daily., Disp: , Rfl:  .  DULoxetine (CYMBALTA) 20 MG capsule, Take 2 capsules (40 mg  total) by mouth daily., Disp: 180 capsule, Rfl: 3 .  EPINEPHrine 0.3 mg/0.3 mL IJ SOAJ injection, Inject 0.3 mg into the muscle once. , Disp: , Rfl:  .  ezetimibe-simvastatin (VYTORIN) 10-40 MG tablet, Take 1 tablet by mouth at bedtime., Disp: 90 tablet, Rfl: 1 .  ibuprofen (ADVIL) 200 MG tablet, Take 400 mg by mouth every 8 (eight) hours as needed., Disp: , Rfl:  .  levothyroxine (SYNTHROID) 100 MCG tablet, Take 1 tablet (100 mcg total) by mouth daily before breakfast., Disp: 90 tablet, Rfl: 1 .  meloxicam (MOBIC) 15 MG tablet, Take 1 tablet (15 mg total) by mouth daily., Disp: 90 tablet, Rfl: 0 .  metFORMIN (GLUCOPHAGE-XR) 500 MG 24 hr tablet, Take 1 tablet (500 mg  total) by mouth 2 (two) times daily., Disp: 180 tablet, Rfl: 1 .  metoprolol succinate (TOPROL-XL) 50 MG 24 hr tablet, Take 1 tablet (50 mg total) by mouth at bedtime. Take with or immediately following a meal., Disp: , Rfl:  .  montelukast (SINGULAIR) 10 MG tablet, Take 1 tablet (10 mg total) by mouth at bedtime., Disp: 30 tablet, Rfl: 3 .  pregabalin (LYRICA) 100 MG capsule, Take 100 mg po QAM and 200 mg PO QPM daily for chronic pain, Disp: 90 capsule, Rfl: 0 .  PROMETHAZINE-DM PO, Take by mouth., Disp: , Rfl:  .  spironolactone (ALDACTONE) 50 MG tablet, Take 50 mg by mouth daily. With breakfast, Disp: , Rfl:  .  tiZANidine (ZANAFLEX) 4 MG tablet, Take 1-1.5 tablets (4-6 mg total) by mouth every 8 (eight) hours as needed for muscle spasms., Disp: 90 tablet, Rfl: 2 .  azithromycin (ZITHROMAX) 250 MG tablet, Take as directed (Patient not taking: Reported on 10/24/2019), Disp: 6 tablet, Rfl: 0 .  fluticasone (FLONASE) 50 MCG/ACT nasal spray, Place 2 sprays into both nostrils daily., Disp: , Rfl:  .  ipratropium-albuterol (DUONEB) 0.5-2.5 (3) MG/3ML SOLN, Take 3 mLs by nebulization 3 (three) times daily as needed., Disp: 180 mL, Rfl: 1 .  omeprazole (PRILOSEC) 40 MG capsule, Take 1 capsule (40 mg total) by mouth daily., Disp: 90 capsule,  Rfl: 0 .  predniSONE (DELTASONE) 20 MG tablet, Take 2 tablets (40 mg total) by mouth daily with breakfast for 5 days. (Patient not taking: Reported on 10/24/2019), Disp: 10 tablet, Rfl: 0 .  pregabalin (LYRICA) 100 MG capsule, Take 100 mg po QAM and 200 mg PO QPM daily for chronic pain (Patient not taking: Reported on 10/24/2019), Disp: 90 capsule, Rfl: 0 .  Respiratory Therapy Supplies (NEBULIZER/TUBING/MOUTHPIECE) KIT, Disp one nebulizer machine, tubing set and mouthpiece kit, Disp: 1 kit, Rfl: 0 .  Vitamin D, Cholecalciferol, 50 MCG (2000 UT) CAPS, Take 2,000 Units by mouth daily. , Disp: , Rfl:    Allergies  Allergen Reactions  . Shellfish Allergy Rash and Swelling  . Codeine Nausea And Vomiting  . Sulfa Antibiotics Swelling     Family History  Problem Relation Age of Onset  . Heart disease Mother   . Hypertension Mother   . Lymphoma Mother 10       deceased 85  . Stroke Father   . Heart attack Father   . Heart disease Father   . Hypertension Father   . Alcohol abuse Father   . Hypertension Sister   . Colon cancer Sister 42  . Hypertension Brother   . Diabetes Brother   . Mental illness Brother   . Depression Brother   . Lung cancer Brother 50       smoker; currently 65  . Alcohol abuse Son   . Breast cancer Maternal Aunt 75       currently 63  . Breast cancer Maternal Aunt        dx 11s; mets in 94s  . AAA (abdominal aortic aneurysm) Maternal Grandmother   . Heart disease Maternal Grandmother   . Heart attack Paternal Grandfather   . Throat cancer Paternal Uncle        smoker     Social History   Socioeconomic History  . Marital status: Married    Spouse name: Sonia Side  . Number of children: 2  . Years of education: Not on file  . Highest education level: Master's degree (e.g., MA, MS, MEng, MEd,  MSW, MBA)  Occupational History  . Occupation: retired  Tobacco Use  . Smoking status: Never Smoker  . Smokeless tobacco: Never Used  Substance and Sexual Activity   . Alcohol use: No    Alcohol/week: 0.0 standard drinks  . Drug use: No  . Sexual activity: Not Currently  Other Topics Concern  . Not on file  Social History Narrative  . Not on file   Social Determinants of Health   Financial Resource Strain: Low Risk   . Difficulty of Paying Living Expenses: Not hard at all  Food Insecurity: No Food Insecurity  . Worried About Charity fundraiser in the Last Year: Never true  . Ran Out of Food in the Last Year: Never true  Transportation Needs: No Transportation Needs  . Lack of Transportation (Medical): No  . Lack of Transportation (Non-Medical): No  Physical Activity: Inactive  . Days of Exercise per Week: 0 days  . Minutes of Exercise per Session: 0 min  Stress: Stress Concern Present  . Feeling of Stress : To some extent  Social Connections: Slightly Isolated  . Frequency of Communication with Friends and Family: More than three times a week  . Frequency of Social Gatherings with Friends and Family: More than three times a week  . Attends Religious Services: More than 4 times per year  . Active Member of Clubs or Organizations: No  . Attends Archivist Meetings: Never  . Marital Status: Married  Human resources officer Violence: Not At Risk  . Fear of Current or Ex-Partner: No  . Emotionally Abused: No  . Physically Abused: No  . Sexually Abused: No    Chart Review Today: I personally reviewed active problem list, medication list, allergies, family history, social history, health maintenance, notes from last encounter, lab results, imaging with the patient/caregiver today.   Review of Systems  Constitutional: Negative.   HENT: Negative.   Eyes: Negative.   Respiratory: Negative.   Cardiovascular: Negative.   Gastrointestinal: Negative.   Endocrine: Negative.   Genitourinary: Negative.   Musculoskeletal: Negative.   Skin: Negative.   Allergic/Immunologic: Negative.   Neurological: Negative.   Hematological: Negative.     Psychiatric/Behavioral: Negative.   All other systems reviewed and are negative.      Objective:   Vitals:   10/24/19 1547  BP: 140/78  Pulse: (!) 101  Resp: 14  Temp: 98.1 F (36.7 C)  SpO2: 97%  Weight: 178 lb (80.7 kg)  Height: '5\' 4"'  (1.626 m)    Body mass index is 30.55 kg/m.  Physical Exam Vitals and nursing note reviewed.  Constitutional:      General: She is not in acute distress.    Appearance: Normal appearance. She is well-developed. She is obese. She is not ill-appearing, toxic-appearing or diaphoretic.     Interventions: Face mask in place.     Comments: Tired, but non-toxic appearing, NAD  HENT:     Head: Normocephalic and atraumatic.     Salivary Glands: Right salivary gland is not diffusely enlarged or tender. Left salivary gland is not diffusely enlarged or tender.     Right Ear: External ear normal.     Left Ear: External ear normal.  Eyes:     General: Lids are normal. No scleral icterus.       Right eye: No discharge.        Left eye: No discharge.     Conjunctiva/sclera: Conjunctivae normal.  Neck:     Trachea: Phonation  normal. No tracheal deviation.  Cardiovascular:     Rate and Rhythm: Normal rate and regular rhythm.     Pulses: Normal pulses.          Radial pulses are 2+ on the right side and 2+ on the left side.       Posterior tibial pulses are 2+ on the right side and 2+ on the left side.     Heart sounds: Normal heart sounds. No murmur. No friction rub. No gallop.   Pulmonary:     Effort: Pulmonary effort is normal. No respiratory distress.     Breath sounds: Normal breath sounds. No stridor. No wheezing, rhonchi or rales.     Comments: Scattered rhonchi, frequent coughing No tachypnea, accessory muscle use or retractions He does have end expiration wheeze and is very tight sounding particularly to the bilateral lower lung fields much tighter and wheezy on the right than on the left No rales auscultated Chest:     Chest wall: No  tenderness.  Abdominal:     General: Bowel sounds are normal. There is no distension.     Palpations: Abdomen is soft.     Tenderness: There is no abdominal tenderness. There is no guarding or rebound.  Musculoskeletal:        General: No deformity. Normal range of motion.     Cervical back: Normal range of motion and neck supple.     Right lower leg: No edema.     Left lower leg: No edema.  Lymphadenopathy:     Cervical: No cervical adenopathy.  Skin:    General: Skin is warm and dry.     Capillary Refill: Capillary refill takes less than 2 seconds.     Coloration: Skin is not jaundiced or pale.     Findings: No rash.  Neurological:     Mental Status: She is alert and oriented to person, place, and time.     Motor: No abnormal muscle tone.     Gait: Gait normal.  Psychiatric:        Speech: Speech normal.        Behavior: Behavior normal.      Results for orders placed or performed in visit on 08/25/19  TSH  Result Value Ref Range   TSH 0.19 (L) 0.40 - 4.50 mIU/L        Assessment & Plan:      ICD-10-CM   1. Mild intermittent asthma without status asthmaticus with acute exacerbation  J45.21 Respiratory Therapy Supplies (NEBULIZER/TUBING/MOUTHPIECE) KIT    ipratropium-albuterol (DUONEB) 0.5-2.5 (3) MG/3ML SOLN    chlorpheniramine-HYDROcodone (TUSSIONEX PENNKINETIC ER) 10-8 MG/5ML SUER  2. Dyspepsia  R10.13 omeprazole (PRILOSEC) 40 MG capsule  3. Moderate persistent asthmatic bronchitis with acute exacerbation  J45.41 Respiratory Therapy Supplies (NEBULIZER/TUBING/MOUTHPIECE) KIT    ipratropium-albuterol (DUONEB) 0.5-2.5 (3) MG/3ML SOLN    chlorpheniramine-HYDROcodone (TUSSIONEX PENNKINETIC ER) 10-8 MG/5ML SUER    Meds previously prescribed and received by the pharmacy : benzonatate (TESSALON) 100 MG capsule 30 capsule 0 10/21/2019   Sig - Route:  Take 1 capsule (100 mg total) by mouth 3 (three) times daily as needed for cough. - Oral  Class:  Normal  Authorizing  Provider:  Delsa Grana, PA-C  montelukast (SINGULAIR) 10 MG tablet 30 tablet 3 10/21/2019   Sig - Route:  Take 1 tablet (10 mg total) by mouth at bedtime. - Oral  Class:  Normal  Authorizing Provider:  Delsa Grana, PA-C  predniSONE (DELTASONE) 20 MG tablet  10 tablet 0 10/21/2019 10/26/2019  Sig - Route:  Take 2 tablets (40 mg total) by mouth daily with breakfast for 5 days. - Oral  Class:  Normal  Authorizing Provider:  Delsa Grana, PA-C  albuterol (VENTOLIN HFA) 108 (90 Base) MCG/ACT inhaler 18 g 1 10/21/2019   Sig - Route:  Inhale 2 puffs into the lungs every 4 (four) hours as needed for wheezing or shortness of breath. - Inhalation  Class:  Normal  DAW:  No  Comment:  Asthma and acute bronchitis and asthma exacerbation  Authorizing Provider:  Delsa Grana, PA-C   DG Chest 2 View -patient sent across the street to the outpatient imaging center to get her chest x-ray before they closed today  Did encourage patient to continue to use over-the-counter medications like Delsym, Robitussin and Mucinex She will need to start steroids, can use Tessalon as needed and we did discuss a stronger cough medicine and she is going to try Tussionex She has been using her albuterol inhaler only every 6 hours and not using it more frequently for coughing fits, chest tightness wheeze or shortness of breath I encouraged her to use it up to 2 puffs and repeat in 20 minutes x 3 if having severe worsening of her respiratory symptoms, may need to use it more frequently in the next couple days and then should be able to decrease use as if the medications are working particularly steroids with her history of asthmatic bronchitis  Do think that patient will benefit from nebulizer treatments so nebulizer machine order was given to her today and signed we discussed places she can go get it from, I sent DuoNebs to her pharmacy  Pending x-ray-right now I do not hear pneumonia so we will wait for x-ray results before calling in  any other antibiotics do think this is likely a severe asthma exacerbation for her  She was instructed to follow-up early next week if not feeling better, she was also instructed to follow-up if she feels suddenly worse when she is done with steroids, may need to do a longer taper or get her on a maintenance inhaler?  She requested a refill of her omeprazole-which I also sent in, I did advise her that when taking steroids can be very hard on her stomach she may need to double up on her omeprazole or use other medications with it such as Pepcid with any GI irritation/gastritis  Delsa Grana, PA-C 10/24/19 4:49 PM

## 2019-10-27 ENCOUNTER — Ambulatory Visit
Admission: RE | Admit: 2019-10-27 | Discharge: 2019-10-27 | Disposition: A | Discharge status: Home / Self Care | Payer: Medicare PPO | Source: Ambulatory Visit | Attending: Physician Assistant | Admitting: Physician Assistant

## 2019-10-27 ENCOUNTER — Other Ambulatory Visit: Payer: Self-pay

## 2019-10-27 DIAGNOSIS — J324 Chronic pansinusitis: Secondary | ICD-10-CM | POA: Diagnosis present

## 2019-10-29 ENCOUNTER — Ambulatory Visit
Admission: RE | Admit: 2019-10-29 | Discharge: 2019-10-29 | Disposition: A | Discharge status: Home / Self Care | Payer: Medicare PPO | Source: Ambulatory Visit | Attending: Family Medicine | Admitting: Family Medicine

## 2019-10-29 DIAGNOSIS — Z1231 Encounter for screening mammogram for malignant neoplasm of breast: Secondary | ICD-10-CM | POA: Insufficient documentation

## 2019-11-19 ENCOUNTER — Other Ambulatory Visit: Payer: Self-pay

## 2019-11-19 DIAGNOSIS — R7303 Prediabetes: Secondary | ICD-10-CM

## 2019-11-19 NOTE — Telephone Encounter (Signed)
Refill request for general medication.  Last office visit:07/09/19  No follow-ups on file.  Lab Results  Component Value Date   HGBA1C 5.8 (H) 07/11/2019

## 2019-11-20 MED ORDER — METFORMIN HCL ER 500 MG PO TB24: 500.0000 mg | ORAL_TABLET | Freq: Two times a day (BID) | ORAL | 3 refills | Status: DC

## 2019-12-02 ENCOUNTER — Encounter: Payer: Self-pay | Admitting: Family Medicine

## 2019-12-05 ENCOUNTER — Other Ambulatory Visit: Payer: Self-pay | Admitting: Family Medicine

## 2019-12-05 DIAGNOSIS — M542 Cervicalgia: Secondary | ICD-10-CM

## 2019-12-05 MED ORDER — MELOXICAM 15 MG PO TABS: 15.0000 mg | ORAL_TABLET | Freq: Every day | ORAL | 0 refills | Status: DC

## 2019-12-05 NOTE — Telephone Encounter (Signed)
Medication Refill - Medication: meloxicam   Has the patient contacted their pharmacy? Yes.   (Agent: If no, request that the patient contact the pharmacy for the refill.) (Agent: If yes, when and what did the pharmacy advise?)  Preferred Pharmacy (with phone number or street name):  [facility] - [place], [place] - [address]  [address] [place] [address]  Phone: [phone] Fax: [phone]  Not a 24 hour pharmacy; exact hours not known.     Agent: Please be advised that RX refills may take up to 3 business days. We ask that you follow-up with your pharmacy.

## 2019-12-24 LAB — TSH: TSH: 1.36 mIU/L (ref 0.40–4.50)

## 2019-12-25 ENCOUNTER — Other Ambulatory Visit: Payer: Self-pay | Admitting: Family Medicine

## 2019-12-25 DIAGNOSIS — E039 Hypothyroidism, unspecified: Secondary | ICD-10-CM

## 2019-12-25 MED ORDER — LEVOTHYROXINE SODIUM 75 MCG PO TABS: 75.0000 ug | ORAL_TABLET | Freq: Every day | ORAL | 3 refills | Status: DC

## 2020-01-06 DIAGNOSIS — M1811 Unilateral primary osteoarthritis of first carpometacarpal joint, right hand: Secondary | ICD-10-CM | POA: Diagnosis not present

## 2020-01-06 DIAGNOSIS — M65331 Trigger finger, right middle finger: Secondary | ICD-10-CM | POA: Diagnosis not present

## 2020-01-06 DIAGNOSIS — M152 Bouchard's nodes (with arthropathy): Secondary | ICD-10-CM | POA: Insufficient documentation

## 2020-01-06 DIAGNOSIS — M7989 Other specified soft tissue disorders: Secondary | ICD-10-CM | POA: Diagnosis not present

## 2020-01-06 DIAGNOSIS — M65341 Trigger finger, right ring finger: Secondary | ICD-10-CM | POA: Diagnosis not present

## 2020-01-06 DIAGNOSIS — M6530 Trigger finger, unspecified finger: Secondary | ICD-10-CM | POA: Insufficient documentation

## 2020-01-06 DIAGNOSIS — M25541 Pain in joints of right hand: Secondary | ICD-10-CM | POA: Diagnosis not present

## 2020-01-13 ENCOUNTER — Telehealth: Payer: Self-pay

## 2020-01-13 DIAGNOSIS — M5412 Radiculopathy, cervical region: Secondary | ICD-10-CM

## 2020-01-13 DIAGNOSIS — M542 Cervicalgia: Secondary | ICD-10-CM

## 2020-01-13 DIAGNOSIS — M549 Dorsalgia, unspecified: Secondary | ICD-10-CM

## 2020-01-13 DIAGNOSIS — G8928 Other chronic postprocedural pain: Secondary | ICD-10-CM

## 2020-01-13 DIAGNOSIS — M797 Fibromyalgia: Secondary | ICD-10-CM

## 2020-01-13 MED ORDER — TIZANIDINE HCL 4 MG PO TABS: 4.0000 mg | ORAL_TABLET | Freq: Three times a day (TID) | ORAL | 0 refills | Status: DC | PRN

## 2020-01-13 MED ORDER — PREGABALIN 100 MG PO CAPS: ORAL_CAPSULE | ORAL | 0 refills | Status: DC

## 2020-01-13 NOTE — Telephone Encounter (Signed)
Last OV for pain management Jan, with 4 month f/up OV advised- none since then  Pt will be notified she need OV for routine conditions and for continued pain management and refills on controlled substances and sedating meds.

## 2020-01-14 NOTE — Telephone Encounter (Signed)
No answer when I called to schedule an appt

## 2020-01-17 ENCOUNTER — Other Ambulatory Visit: Payer: Self-pay | Admitting: Family Medicine

## 2020-01-17 DIAGNOSIS — E782 Mixed hyperlipidemia: Secondary | ICD-10-CM

## 2020-01-19 NOTE — Telephone Encounter (Signed)
Patient wanted to ensure PCP received request for pharmacy for ezetimibe-simvastatin (VYTORIN) 10-40 MG tablet, informed patient please allow 48 to 72 hour turn around time.   [facility] - [place], [place] - [address] Phone:  [phone]  Fax:  [phone]

## 2020-02-17 ENCOUNTER — Other Ambulatory Visit: Payer: Self-pay | Admitting: Family Medicine

## 2020-02-17 DIAGNOSIS — R1013 Epigastric pain: Secondary | ICD-10-CM

## 2020-02-18 ENCOUNTER — Other Ambulatory Visit: Payer: Self-pay

## 2020-02-18 DIAGNOSIS — G8929 Other chronic pain: Secondary | ICD-10-CM

## 2020-02-18 DIAGNOSIS — M5412 Radiculopathy, cervical region: Secondary | ICD-10-CM

## 2020-02-18 DIAGNOSIS — G8928 Other chronic postprocedural pain: Secondary | ICD-10-CM

## 2020-02-18 DIAGNOSIS — M542 Cervicalgia: Secondary | ICD-10-CM

## 2020-02-18 DIAGNOSIS — M797 Fibromyalgia: Secondary | ICD-10-CM

## 2020-02-18 MED ORDER — PREGABALIN 100 MG PO CAPS: ORAL_CAPSULE | ORAL | 0 refills | Status: DC

## 2020-03-03 ENCOUNTER — Other Ambulatory Visit: Payer: Self-pay | Admitting: Family Medicine

## 2020-03-03 DIAGNOSIS — M542 Cervicalgia: Secondary | ICD-10-CM

## 2020-03-18 DIAGNOSIS — F411 Generalized anxiety disorder: Secondary | ICD-10-CM | POA: Diagnosis not present

## 2020-03-18 DIAGNOSIS — E119 Type 2 diabetes mellitus without complications: Secondary | ICD-10-CM | POA: Diagnosis not present

## 2020-03-18 DIAGNOSIS — E782 Mixed hyperlipidemia: Secondary | ICD-10-CM | POA: Diagnosis not present

## 2020-03-18 DIAGNOSIS — R079 Chest pain, unspecified: Secondary | ICD-10-CM | POA: Diagnosis not present

## 2020-03-18 DIAGNOSIS — E669 Obesity, unspecified: Secondary | ICD-10-CM | POA: Diagnosis not present

## 2020-03-18 DIAGNOSIS — I10 Essential (primary) hypertension: Secondary | ICD-10-CM | POA: Diagnosis not present

## 2020-03-22 ENCOUNTER — Other Ambulatory Visit: Payer: Self-pay | Admitting: Family Medicine

## 2020-03-22 ENCOUNTER — Ambulatory Visit: Payer: Medicare PPO | Admitting: Family Medicine

## 2020-03-22 ENCOUNTER — Other Ambulatory Visit: Payer: Self-pay

## 2020-03-22 ENCOUNTER — Encounter: Payer: Self-pay | Admitting: Family Medicine

## 2020-03-22 ENCOUNTER — Ambulatory Visit
Admission: RE | Admit: 2020-03-22 | Discharge: 2020-03-22 | Disposition: A | Discharge status: Home / Self Care | Payer: Medicare PPO | Source: Ambulatory Visit | Attending: Family Medicine | Admitting: Family Medicine

## 2020-03-22 VITALS — BP 124/78 | HR 76 | Temp 98.2°F | Resp 16 | Ht 64.0 in | Wt 193.2 lb

## 2020-03-22 DIAGNOSIS — K8050 Calculus of bile duct without cholangitis or cholecystitis without obstruction: Secondary | ICD-10-CM | POA: Insufficient documentation

## 2020-03-22 DIAGNOSIS — M5412 Radiculopathy, cervical region: Secondary | ICD-10-CM

## 2020-03-22 DIAGNOSIS — I10 Essential (primary) hypertension: Secondary | ICD-10-CM

## 2020-03-22 DIAGNOSIS — J4521 Mild intermittent asthma with (acute) exacerbation: Secondary | ICD-10-CM

## 2020-03-22 DIAGNOSIS — F3342 Major depressive disorder, recurrent, in full remission: Secondary | ICD-10-CM

## 2020-03-22 DIAGNOSIS — R7303 Prediabetes: Secondary | ICD-10-CM | POA: Insufficient documentation

## 2020-03-22 DIAGNOSIS — R110 Nausea: Secondary | ICD-10-CM | POA: Insufficient documentation

## 2020-03-22 DIAGNOSIS — E039 Hypothyroidism, unspecified: Secondary | ICD-10-CM | POA: Diagnosis not present

## 2020-03-22 DIAGNOSIS — R1011 Right upper quadrant pain: Secondary | ICD-10-CM | POA: Insufficient documentation

## 2020-03-22 DIAGNOSIS — G35 Multiple sclerosis: Secondary | ICD-10-CM

## 2020-03-22 DIAGNOSIS — G8928 Other chronic postprocedural pain: Secondary | ICD-10-CM | POA: Diagnosis not present

## 2020-03-22 DIAGNOSIS — E119 Type 2 diabetes mellitus without complications: Secondary | ICD-10-CM | POA: Insufficient documentation

## 2020-03-22 DIAGNOSIS — E782 Mixed hyperlipidemia: Secondary | ICD-10-CM | POA: Diagnosis not present

## 2020-03-22 DIAGNOSIS — M797 Fibromyalgia: Secondary | ICD-10-CM

## 2020-03-22 DIAGNOSIS — K760 Fatty (change of) liver, not elsewhere classified: Secondary | ICD-10-CM | POA: Diagnosis not present

## 2020-03-22 DIAGNOSIS — K7689 Other specified diseases of liver: Secondary | ICD-10-CM | POA: Diagnosis not present

## 2020-03-22 DIAGNOSIS — K219 Gastro-esophageal reflux disease without esophagitis: Secondary | ICD-10-CM

## 2020-03-22 MED ORDER — PANTOPRAZOLE SODIUM 40 MG PO TBEC: 40.0000 mg | DELAYED_RELEASE_TABLET | Freq: Every day | ORAL | 3 refills | Status: DC

## 2020-03-22 MED ORDER — FAMOTIDINE 20 MG PO TABS: 20.0000 mg | ORAL_TABLET | Freq: Two times a day (BID) | ORAL | 0 refills | Status: DC

## 2020-03-22 MED ORDER — ONDANSETRON 4 MG PO TBDP: 4.0000 mg | ORAL_TABLET | Freq: Three times a day (TID) | ORAL | 1 refills | Status: AC | PRN

## 2020-03-22 NOTE — Progress Notes (Signed)
Patient ID: Brittany Hill, female    DOB: 05/08/1951, 69 y.o.   MRN: [id number]  PCP: Delsa Grana, PA-C  Chief Complaint  Patient presents with  . Abdominal Pain    nausea, RUQ pain    Subjective:   Brittany Hill is a 69 y.o. female, presents to clinic with CC of the following:  Onset about 1.5 years ago, comes in waves of big episodes of pain associated with nausea. Onset again about one week ago, pain severe epigastric to RUQ, radiated to back, almost made her go to the hospital.  Lasted severe for about 30 min. Occurred after eating hushpuppies which she normally doesn't eat.  She has had continued but less severe pain since then, nausea and pain with eating, she has severe N when taking prilosec.  Hx of severe GERD and some dysphagia managed by [facility] doctor.  She notes associated bloating, indigestion, reflux.  Denies change to bowels, denies urinary sx. Last year had work up for same with neg RUQ [place] and HIDA scan.  Had past EGD and Colonscopy (3-4 years ago)   Abdominal Pain This is a recurrent problem. The current episode started in the past 7 days (onset over one year ago). The onset quality is sudden. The problem occurs intermittently. The problem has been waxing and waning. The pain is located in the epigastric region and RUQ. The pain is severe. The quality of the pain is colicky, cramping and aching (pinch). The abdominal pain radiates to the RUQ and back. Associated symptoms include belching, constipation and nausea. Pertinent negatives include no anorexia, arthralgias, diarrhea, dysuria, fever, flatus, frequency, headaches, hematochezia, hematuria, melena, myalgias, vomiting or weight loss. The pain is aggravated by eating. The pain is relieved by nothing. She has tried proton pump inhibitors for the symptoms. Prior diagnostic workup includes upper endoscopy, ultrasound and GI consult. Her past medical history is significant for GERD and irritable bowel syndrome. There  is no history of abdominal surgery, colon cancer, Crohn's disease, gallstones, pancreatitis, PUD or ulcerative colitis.      Patient Active Problem List   Diagnosis Date Noted  . Type 2 diabetes mellitus without complication, without long-term current use of insulin ([place]) 03/22/2020  . Chronic post-operative pain 09/05/2019  . Neck pain 09/05/2019  . Cervical radiculopathy 09/05/2019  . Displacement of cervical intervertebral disc 05/14/2019  . Fatty liver disease, nonalcoholic 34/19/3790  . Osteopenia 10/20/2018  . Obesity (BMI 30.0-34.9) 09/27/2018  . Cataract, left 04/02/2018  . Osteoarthritis of left ankle 02/16/2018  . Asthma without status asthmaticus 10/05/2017  . Genetic testing 08/28/2017  . Family hx-breast malignancy 08/02/2017  . Dense breast tissue on mammogram 07/28/2016  . Left sided sciatica 12/19/2015  . Left lumbar radiculopathy 12/16/2015  . Hypomagnesemia 12/06/2015  . Hypokalemia 12/02/2015  . Fibromyalgia   . MS (multiple sclerosis) ([place])   . Chronic constipation   . OA (osteoarthritis)   . Hypothyroidism   . IFG (impaired fasting glucose)   . Hypertension   . Hyperlipidemia   . GERD (gastroesophageal reflux disease)   . MDD (major depressive disorder), recurrent, in full remission ([place])   . Anxiety   . TMJ syndrome       Current Outpatient Medications:  .  acetaminophen (TYLENOL) 500 MG tablet, Take 1,000 mg by mouth every 6 (six) hours as needed., Disp: , Rfl:  .  calcium carbonate (CALCIUM 600) 600 MG TABS tablet, Take 600 mg by mouth as needed. , Disp: ,  Rfl:  .  cetirizine (ZYRTEC) 10 MG tablet, Take 10 mg by mouth daily., Disp: , Rfl:  .  DULoxetine (CYMBALTA) 20 MG capsule, Take 2 capsules (40 mg total) by mouth daily., Disp: 180 capsule, Rfl: 3 .  EPINEPHrine 0.3 mg/0.3 mL IJ SOAJ injection, Inject 0.3 mg into the muscle once. , Disp: , Rfl:  .  ezetimibe-simvastatin (VYTORIN) 10-40 MG tablet, Take 1 tablet by mouth at bedtime., Disp: 90  tablet, Rfl: 0 .  levothyroxine (SYNTHROID) 75 MCG tablet, Take 1 tablet (75 mcg total) by mouth daily., Disp: 90 tablet, Rfl: 3 .  meloxicam (MOBIC) 15 MG tablet, Take 1 tablet (15 mg total) by mouth daily., Disp: 90 tablet, Rfl: 0 .  metFORMIN (GLUCOPHAGE-XR) 500 MG 24 hr tablet, Take 1 tablet (500 mg total) by mouth 2 (two) times daily., Disp: 180 tablet, Rfl: 3 .  metoprolol succinate (TOPROL-XL) 50 MG 24 hr tablet, Take 1 tablet (50 mg total) by mouth at bedtime. Take with or immediately following a meal., Disp: , Rfl:  .  omeprazole (PRILOSEC) 40 MG capsule, Take 1 capsule (40 mg total) by mouth daily., Disp: 90 capsule, Rfl: 0 .  pregabalin (LYRICA) 100 MG capsule, Take 100 mg po QAM and 200 mg PO QPM daily for chronic pain, Disp: 270 capsule, Rfl: 0 .  spironolactone (ALDACTONE) 50 MG tablet, Take 50 mg by mouth daily. With breakfast, Disp: , Rfl:  .  tiZANidine (ZANAFLEX) 4 MG tablet, Take 1-1.5 tablets (4-6 mg total) by mouth every 8 (eight) hours as needed for muscle spasms., Disp: 90 tablet, Rfl: 0 .  Vitamin D, Cholecalciferol, 50 MCG (2000 UT) CAPS, Take 2,000 Units by mouth daily. , Disp: , Rfl:  .  albuterol (VENTOLIN HFA) 108 (90 Base) MCG/ACT inhaler, Inhale 2 puffs into the lungs every 4 (four) hours as needed for wheezing or shortness of breath. (Patient not taking: Reported on 03/22/2020), Disp: 18 g, Rfl: 1 .  azithromycin (ZITHROMAX) 250 MG tablet, Take as directed (Patient not taking: Reported on 10/24/2019), Disp: 6 tablet, Rfl: 0 .  fluticasone (FLONASE) 50 MCG/ACT nasal spray, Place 2 sprays into both nostrils daily. (Patient not taking: Reported on 03/22/2020), Disp: , Rfl:  .  ipratropium-albuterol (DUONEB) 0.5-2.5 (3) MG/3ML SOLN, Take 3 mLs by nebulization 3 (three) times daily as needed. (Patient not taking: Reported on 03/22/2020), Disp: 180 mL, Rfl: 1 .  montelukast (SINGULAIR) 10 MG tablet, Take 1 tablet (10 mg total) by mouth at bedtime. (Patient not taking: Reported on  03/22/2020), Disp: 30 tablet, Rfl: 3 .  pregabalin (LYRICA) 100 MG capsule, Take 100 mg po QAM and 200 mg PO QPM daily for chronic pain (Patient not taking: Reported on 10/24/2019), Disp: 90 capsule, Rfl: 0 .  PROMETHAZINE-DM PO, Take by mouth. (Patient not taking: Reported on 03/22/2020), Disp: , Rfl:  .  Respiratory Therapy Supplies (NEBULIZER/TUBING/MOUTHPIECE) KIT, Disp one nebulizer machine, tubing set and mouthpiece kit (Patient not taking: Reported on 03/22/2020), Disp: 1 kit, Rfl: 0   Allergies  Allergen Reactions  . Shellfish Allergy Rash and Swelling  . Codeine Nausea And Vomiting  . Sulfa Antibiotics Swelling     Social History   Tobacco Use  . Smoking status: Never Smoker  . Smokeless tobacco: Never Used  Vaping Use  . Vaping Use: Never used  Substance Use Topics  . Alcohol use: No    Alcohol/week: 0.0 standard drinks  . Drug use: No      Chart Review Today:  I personally reviewed active problem list, medication list, allergies, family history, social history, health maintenance, notes from last encounter, lab results, imaging with the patient/caregiver today.  Reviewed past [place] and HIDA scan, labs   Review of Systems  Constitutional: Negative.  Negative for activity change, appetite change, chills, diaphoresis, fatigue, fever and weight loss.  HENT: Negative.   Eyes: Negative.   Respiratory: Negative.   Cardiovascular: Negative.   Gastrointestinal: Positive for abdominal distention, abdominal pain, constipation and nausea. Negative for anal bleeding, anorexia, blood in stool, diarrhea, flatus, hematochezia, melena, rectal pain and vomiting.  Endocrine: Negative.   Genitourinary: Negative.  Negative for dysuria, frequency and hematuria.  Musculoskeletal: Negative.  Negative for arthralgias and myalgias.  Skin: Negative.   Allergic/Immunologic: Negative.   Neurological: Negative.  Negative for headaches.  Hematological: Negative.   Psychiatric/Behavioral:  Negative.   All other systems reviewed and are negative.      Objective:   Vitals:   03/22/20 0806  BP: 124/78  Pulse: 76  Resp: 16  Temp: 98.2 F (36.8 C)  TempSrc: Oral  SpO2: 99%  Weight: 193 lb 3.2 oz (87.6 kg)  Height: _0  (1.626 m)    Body mass index is 33.16 kg/m.  Physical Exam Vitals and nursing note reviewed.  Constitutional:      General: She is not in acute distress.    Appearance: Normal appearance. She is well-developed. She is not ill-appearing, toxic-appearing or diaphoretic.  HENT:     Head: Normocephalic and atraumatic.     Nose: Nose normal.  Eyes:     General:        Right eye: No discharge.        Left eye: No discharge.     Conjunctiva/sclera: Conjunctivae normal.  Neck:     Trachea: No tracheal deviation.  Cardiovascular:     Rate and Rhythm: Normal rate and regular rhythm.     Pulses: Normal pulses.     Heart sounds: Normal heart sounds. No murmur heard.  No friction rub. No gallop.   Pulmonary:     Effort: Pulmonary effort is normal. No respiratory distress.     Breath sounds: Normal breath sounds. No stridor. No wheezing, rhonchi or rales.  Abdominal:     General: Bowel sounds are normal. There is no distension.     Palpations: Abdomen is soft. There is no mass or pulsatile mass.     Tenderness: There is abdominal tenderness in the right upper quadrant and epigastric area. There is guarding. There is no right CVA tenderness, left CVA tenderness or rebound. Positive signs include Murphy's sign. Negative signs include Rovsing's sign.  Skin:    General: Skin is warm and dry.     Coloration: Skin is not jaundiced or pale.     Findings: No rash.  Neurological:     Mental Status: She is alert.     Motor: No abnormal muscle tone.     Coordination: Coordination normal.  Psychiatric:        Mood and Affect: Mood normal.        Behavior: Behavior normal.      Results for orders placed or performed in visit on 12/01/19  HM DIABETES EYE  EXAM  Result Value Ref Range   HM Diabetic Eye Exam No Retinopathy No Retinopathy       Assessment & Plan:     ICD-10-CM   1. RUQ abdominal pain  R10.11 CBC with Differential/Platelet    COMPLETE METABOLIC PANEL  WITH GFR    Urinalysis, Routine w reflex microscopic    Urine Culture    Lipase    US Abdomen Limited RUQ    pantoprazole (PROTONIX) 40 MG tablet    ondansetron (ZOFRAN ODT) 4 MG disintegrating tablet    famotidine (PEPCID) 20 MG tablet   see plan below  2. Biliary colic symptom  Q11.94 CBC with Differential/Platelet    COMPLETE METABOLIC PANEL WITH GFR    Urinalysis, Routine w reflex microscopic    Urine Culture    Lipase    US Abdomen Limited RUQ   another episode of epigastric and RUQ pain, severe, occured after eating, radiated to right back area, past testing neg - may need GI or surgeon eval, [place] today  3. Chronic post-operative pain  G89.28    managed with lyrica 100 am and 200 pm, muscle relaxers, completed PT, sx better than pain prior to surgery  4. Cervical radiculopathy  M54.12    per spine surgeon - managed with lyrica 100 am and 200 pm, muscle relaxers, completed PT, sx better than pain prior to surgery  5. Fibromyalgia  M79.7    stable sx, well controlled, pain is managable   6. Mild intermittent asthma without status asthmaticus with acute exacerbation  J45.21   7. MS (multiple sclerosis) ([place])  G35    MS not on meds  8. Gastroesophageal reflux disease without esophagitis  K21.9 pantoprazole (PROTONIX) 40 MG tablet    ondansetron (ZOFRAN ODT) 4 MG disintegrating tablet    famotidine (PEPCID) 20 MG tablet  9. Hypothyroidism, unspecified type  E03.9    last repeated labs TSH was in normal range   10. Essential hypertension  R74 COMPLETE METABOLIC PANEL WITH GFR   BP stable and at goal today  11. Fatty liver disease, nonalcoholic  Y81.4 COMPLETE METABOLIC PANEL WITH GFR   noted on RUQ [place] - she has had normal LFTs  12. Mixed hyperlipidemia  E78.2  COMPLETE METABOLIC PANEL WITH GFR  13. Type 2 diabetes mellitus without complication, without long-term current use of insulin ([facility])  G81.8 COMPLETE METABOLIC PANEL WITH GFR    Microalbumin, urine    Hemoglobin A1c    CANCELED: Hemoglobin A1c   labs obtained today - will do OV in next 1-2 weeks for routine conditions  14. MDD (major depressive disorder), recurrent, in full remission ([place]) Chronic F33.42   15. Nausea  R11.0 CBC with Differential/Platelet    COMPLETE METABOLIC PANEL WITH GFR    Microalbumin, urine    Urinalysis, Routine w reflex microscopic    Lipase    US Abdomen Limited RUQ    pantoprazole (PROTONIX) 40 MG tablet    ondansetron (ZOFRAN ODT) 4 MG disintegrating tablet    famotidine (PEPCID) 20 MG tablet   zofran prn, avoid fatty fried foods, start pepcid bid, and take protonix at bedtime on empty stomach, d/c prilosec - possibly GERD/PUD? GI f/up   R/o cholecystitis - possibly passing of gallstone? Vs GERD/gastritis/PUD - N with prilosec - instructed to start pepcid bid and change from prilosec to protonix in the evening - f/up with GI  RUQ limited US arranged today due to tenderness on exam - did not suspect acute abd, she has been tolerating PO's, denies urinary sx, bowels alternate from diarrhea to constipation - no change to her baseline.  She will need to do close f/up on abd pain if not into GI, and she is overdue for routine f/up - labs obtained today, but  will need to do visit for DM/HTN separately   Pt completed labs and went to for stat US      Delsa Grana, PA-C 03/22/20 8:21 AM

## 2020-03-22 NOTE — Patient Instructions (Addendum)
Stop prilosec  Start taking pepcid twice a day during the day time (hold first dose for 4 hours after you take your thyroid meds) hopefully pepcid will help your stomach and allow you to take protonix later in the evening on an empty stomach  You can try zofran as needed for nausea  Still try and get follow up with you [facility] doctor  We will call you today with your ultrasound results.  We MAY need to get further imaging or refer you to a surgeon - we will have to see what your results are  I would like to recheck you soon and do your routine follow up if you can in the next 2-3 weeks.

## 2020-03-23 ENCOUNTER — Telehealth: Payer: Self-pay | Admitting: Family Medicine

## 2020-03-23 NOTE — Telephone Encounter (Signed)
Copied from CRM [id number]. Topic: General - Other >> Mar 23, 2020  9:24 AM Tamela Oddi wrote: Reason for CRM: Patient would like the nurse or doctor to call her to discuss her ultrasound results.  She stated she has a few questions.  CB# [phone]

## 2020-03-24 LAB — URINE CULTURE
MICRO NUMBER:: 10831650
SPECIMEN QUALITY:: ADEQUATE

## 2020-03-24 LAB — HEMOGLOBIN A1C
Hgb A1c MFr Bld: 6.1 % of total Hgb — ABNORMAL HIGH (ref <5.7)
Mean Plasma Glucose: 128 (calc)
eAG (mmol/L): 7.1 (calc)

## 2020-03-24 LAB — MICROALBUMIN, URINE: Microalb, Ur: 0.9 mg/dL

## 2020-03-24 LAB — CBC WITH DIFFERENTIAL/PLATELET
Absolute Monocytes: 539 cells/uL (ref 200–950)
Basophils Absolute: 17 cells/uL (ref 0–200)
Basophils Relative: 0.3 %
Eosinophils Absolute: 191 cells/uL (ref 15–500)
Eosinophils Relative: 3.3 %
HCT: 38.5 % (ref 35.0–45.0)
Hemoglobin: 13 g/dL (ref 11.7–15.5)
Lymphs Abs: 1717 cells/uL (ref 850–3900)
MCH: 29.3 pg (ref 27.0–33.0)
MCHC: 33.8 g/dL (ref 32.0–36.0)
MCV: 86.9 fL (ref 80.0–100.0)
MPV: 10.5 fL (ref 7.5–12.5)
Monocytes Relative: 9.3 %
Neutro Abs: 3335 cells/uL (ref 1500–7800)
Neutrophils Relative %: 57.5 %
Platelets: 219 10*3/uL (ref 140–400)
RBC: 4.43 10*6/uL (ref 3.80–5.10)
RDW: 13.3 % (ref 11.0–15.0)
Total Lymphocyte: 29.6 %
WBC: 5.8 10*3/uL (ref 3.8–10.8)

## 2020-03-24 LAB — URINALYSIS, ROUTINE W REFLEX MICROSCOPIC
Bacteria, UA: NONE SEEN /HPF
Bilirubin Urine: NEGATIVE
Glucose, UA: NEGATIVE
Hgb urine dipstick: NEGATIVE
Hyaline Cast: NONE SEEN /LPF
Ketones, ur: NEGATIVE
Nitrite: NEGATIVE
Protein, ur: NEGATIVE
RBC / HPF: NONE SEEN /HPF (ref 0–2)
Specific Gravity, Urine: 1.018 (ref 1.001–1.03)
pH: 7 (ref 5.0–8.0)

## 2020-03-24 LAB — COMPLETE METABOLIC PANEL WITH GFR
AG Ratio: 1.9 (calc) (ref 1.0–2.5)
ALT: 18 U/L (ref 6–29)
AST: 21 U/L (ref 10–35)
Albumin: 4.7 g/dL (ref 3.6–5.1)
Alkaline phosphatase (APISO): 39 U/L (ref 37–153)
BUN: 11 mg/dL (ref 7–25)
CO2: 28 mmol/L (ref 20–32)
Calcium: 10.1 mg/dL (ref 8.6–10.4)
Chloride: 102 mmol/L (ref 98–110)
Creat: 0.9 mg/dL (ref 0.50–0.99)
GFR, Est African American: 76 mL/min/{1.73_m2} (ref >=60)
GFR, Est Non African American: 66 mL/min/{1.73_m2} (ref >=60)
Globulin: 2.5 g/dL (calc) (ref 1.9–3.7)
Glucose, Bld: 100 mg/dL — ABNORMAL HIGH (ref 65–99)
Potassium: 4.6 mmol/L (ref 3.5–5.3)
Sodium: 137 mmol/L (ref 135–146)
Total Bilirubin: 0.5 mg/dL (ref 0.2–1.2)
Total Protein: 7.2 g/dL (ref 6.1–8.1)

## 2020-03-24 LAB — LIPASE: Lipase: 23 U/L (ref 7–60)

## 2020-03-24 NOTE — Telephone Encounter (Signed)
Pt has concerns for polyp on gallbladder? What does this mean?

## 2020-03-25 DIAGNOSIS — D2262 Melanocytic nevi of left upper limb, including shoulder: Secondary | ICD-10-CM | POA: Diagnosis not present

## 2020-03-25 DIAGNOSIS — L820 Inflamed seborrheic keratosis: Secondary | ICD-10-CM | POA: Diagnosis not present

## 2020-03-25 DIAGNOSIS — D225 Melanocytic nevi of trunk: Secondary | ICD-10-CM | POA: Diagnosis not present

## 2020-03-25 DIAGNOSIS — D2261 Melanocytic nevi of right upper limb, including shoulder: Secondary | ICD-10-CM | POA: Diagnosis not present

## 2020-03-25 DIAGNOSIS — L309 Dermatitis, unspecified: Secondary | ICD-10-CM | POA: Diagnosis not present

## 2020-03-25 DIAGNOSIS — L821 Other seborrheic keratosis: Secondary | ICD-10-CM | POA: Diagnosis not present

## 2020-03-25 DIAGNOSIS — D2271 Melanocytic nevi of right lower limb, including hip: Secondary | ICD-10-CM | POA: Diagnosis not present

## 2020-03-25 DIAGNOSIS — D2272 Melanocytic nevi of left lower limb, including hip: Secondary | ICD-10-CM | POA: Diagnosis not present

## 2020-03-25 DIAGNOSIS — R208 Other disturbances of skin sensation: Secondary | ICD-10-CM | POA: Diagnosis not present

## 2020-03-26 NOTE — Telephone Encounter (Signed)
Sorry where would that be located? I did not see under imaging, or on lab reports or in telephone encounter?

## 2020-03-31 NOTE — Progress Notes (Signed)
Name: KYLEE UMANA   MRN: [id number]    DOB: [birth year]   Date:04/01/2020       Progress Note  Subjective:    I connected with  [name]  on 04/01/20 at 10:20 AM EDT by a video enabled telemedicine application and verified that I am speaking with the correct person using two identifiers.  I discussed the limitations of evaluation and management by telemedicine and the availability of in person appointments. The patient expressed understanding and agreed to proceed. Staff also discussed with the patient that there may be a patient responsible charge related to this service. Patient Location: Home  Provider Location: [facility] Additional Individuals present: none  Chief Complaint  Patient presents with  . Follow-up  . Abdominal Pain    RUQ pain pt states improving  . Hypertension  . Hyperlipidemia  . Diabetes    KAHDIJAH ERRICKSON is a 69 y.o. female, presents for virtual visit for routine follow up on the conditions listed above.  F/up on abd pain - RUQ [place] was negative for gallstones, small gallbladder polyp incidentally found, no recommended f/up, pt change PPI to protonix 40 mg q bedtime and pepcid BID.  Some improvement in her abd sx (pain, bloating) but not completely resolved yet.  Still having some intermittent episodes of abd pain/bloating she notices with too big of meals or portions.  Hyperlipidemia: Currently treated with vytorin, pt reports good med compliance Last Lipids: Lab Results  Component Value Date   CHOL 150 07/11/2019   HDL 47 (L) 07/11/2019   LDLCALC 78 07/11/2019   TRIG 148 07/11/2019   CHOLHDL 3.2 07/11/2019  Last lipids showed LDL at goal - Denies: Chest pain, shortness of breath, myalgias, claudication  DM:   Pt managing DM with Metformin 500 mg BID Reports Good  med compliance Pt has no SE from meds. Blood sugars - no checking Denies: Polyuria, polydipsia, vision changes, neuropathy, hypoglycemia Recent pertinent labs:    Labs obtained 2 weeks ago with acute OV - DM well controlled  Lab Results  Component Value Date   HGBA1C 6.1 (H) 03/22/2020   HGBA1C 5.8 (H) 07/11/2019   HGBA1C 6.1 (H) 10/11/2018   Standard of care and health maintenance: Urine Microalbumin:  03/22/20 Foot exam:  09/27/18 -Needs to be done at next in person office visit - pt keeps a real close watch on feet, no problems with skin, sensation or nail health DM eye exam:  done ACEI/ARB: - not on ? - prediabetes tx was started never  Statin: yes  Hypertension:  Currently managed on Metoprolol 50 mg and spironolactone 50 Pt reports good med compliance and denies any SE.   Blood pressure today is elevated with home reading - she hasn't rechecked it - BP varies the other day BP was 133/72 -140's BP Readings from Last 3 Encounters:  04/01/20 (!) 142/82  03/22/20 124/78  10/24/19 140/78  Pt denies CP, SOB, exertional sx, LE edema, palpitation, Ha's, visual disturbances, lightheadedness, hypotension, syncope. Dietary efforts for BP?  Tries to limit salt intake   Hypothyroid - taking 75 mcg levothyroxine in the morning on empty stomach Lab Results  Component Value Date   TSH 1.36 12/24/2019  We made sure PPI was in the evening She feels moods/weights/energy stable     Patient Active Problem List   Diagnosis Date Noted  . Type 2 diabetes mellitus without complication, without long-term current use of insulin (HCC) 03/22/2020  . Chronic post-operative pain 09/05/2019  .  Neck pain 09/05/2019  . Cervical radiculopathy 09/05/2019  . Displacement of cervical intervertebral disc 05/14/2019  . Fatty liver disease, nonalcoholic 03/28/2019  . Osteopenia 10/20/2018  . Obesity (BMI 30.0-34.9) 09/27/2018  . Cataract, left 04/02/2018  . Osteoarthritis of left ankle 02/16/2018  . Asthma without status asthmaticus 10/05/2017  . Genetic testing 08/28/2017  . Family hx-breast malignancy 08/02/2017  . Dense breast tissue on mammogram 07/28/2016    . Left sided sciatica 12/19/2015  . Left lumbar radiculopathy 12/16/2015  . Hypomagnesemia 12/06/2015  . Hypokalemia 12/02/2015  . Fibromyalgia   . MS (multiple sclerosis) (HCC)   . Chronic constipation   . OA (osteoarthritis)   . Hypothyroidism   . IFG (impaired fasting glucose)   . Hypertension   . Hyperlipidemia   . GERD (gastroesophageal reflux disease)   . MDD (major depressive disorder), recurrent, in full remission (HCC)   . Anxiety   . TMJ syndrome     Current Outpatient Medications:  .  acetaminophen (TYLENOL) 500 MG tablet, Take 1,000 mg by mouth every 6 (six) hours as needed., Disp: , Rfl:  .  calcium carbonate (CALCIUM 600) 600 MG TABS tablet, Take 600 mg by mouth as needed. , Disp: , Rfl:  .  cetirizine (ZYRTEC) 10 MG tablet, Take 10 mg by mouth daily., Disp: , Rfl:  .  cholecalciferol (VITAMIN D3) 25 MCG (1000 UNIT) tablet, Take 1,000 Units by mouth daily., Disp: , Rfl:  .  DULoxetine (CYMBALTA) 20 MG capsule, Take 2 capsules (40 mg total) by mouth daily., Disp: 180 capsule, Rfl: 3 .  EPINEPHrine 0.3 mg/0.3 mL IJ SOAJ injection, Inject 0.3 mg into the muscle once. , Disp: , Rfl:  .  ezetimibe-simvastatin (VYTORIN) 10-40 MG tablet, Take 1 tablet by mouth at bedtime., Disp: 90 tablet, Rfl: 0 .  famotidine (PEPCID) 20 MG tablet, Take 1 tablet (20 mg total) by mouth 2 (two) times daily., Disp: 60 tablet, Rfl: 0 .  levothyroxine (SYNTHROID) 75 MCG tablet, Take 1 tablet (75 mcg total) by mouth daily., Disp: 90 tablet, Rfl: 3 .  meloxicam (MOBIC) 15 MG tablet, Take 1 tablet (15 mg total) by mouth daily., Disp: 90 tablet, Rfl: 0 .  metFORMIN (GLUCOPHAGE-XR) 500 MG 24 hr tablet, Take 1 tablet (500 mg total) by mouth 2 (two) times daily., Disp: 180 tablet, Rfl: 3 .  metoprolol succinate (TOPROL-XL) 50 MG 24 hr tablet, Take 1 tablet (50 mg total) by mouth at bedtime. Take with or immediately following a meal., Disp: , Rfl:  .  ondansetron (ZOFRAN ODT) 4 MG disintegrating tablet,  Take 1 tablet (4 mg total) by mouth every 8 (eight) hours as needed for nausea or vomiting., Disp: 20 tablet, Rfl: 1 .  pantoprazole (PROTONIX) 40 MG tablet, Take 1 tablet (40 mg total) by mouth at bedtime., Disp: 30 tablet, Rfl: 3 .  pregabalin (LYRICA) 100 MG capsule, Take 100 mg po QAM and 200 mg PO QPM daily for chronic pain, Disp: 270 capsule, Rfl: 0 .  spironolactone (ALDACTONE) 50 MG tablet, Take 50 mg by mouth daily. With breakfast, Disp: , Rfl:  .  tiZANidine (ZANAFLEX) 4 MG tablet, Take 1-1.5 tablets (4-6 mg total) by mouth every 8 (eight) hours as needed for muscle spasms., Disp: 90 tablet, Rfl: 0 .  pregabalin (LYRICA) 100 MG capsule, Take 100 mg po QAM and 200 mg PO QPM daily for chronic pain (Patient not taking: Reported on 10/24/2019), Disp: 90 capsule, Rfl: 0 Allergies  Allergen Reactions  .  Shellfish Allergy Rash and Swelling  . Codeine Nausea And Vomiting  . Sulfa Antibiotics Swelling    Past Surgical History:  Procedure Laterality Date  . ABDOMINAL HYSTERECTOMY     complete at age 21; HRT for ~25y  . BACK SURGERY    . CATARACT EXTRACTION W/PHACO Left 09/27/2017   Procedure: CATARACT EXTRACTION PHACO AND INTRAOCULAR LENS PLACEMENT (IOC);  Surgeon: Nevada Crane, MD;  Location: [facility] ORS;  Service: Ophthalmology;  Laterality: Left;  Lot #[id number] [place]:   00:27.2 AP%:  13.7 CDE:  3.75  . CATARACT EXTRACTION W/PHACO Right 10/25/2017   Procedure: CATARACT EXTRACTION PHACO AND INTRAOCULAR LENS PLACEMENT (IOC);  Surgeon: Nevada Crane, MD;  Location: [facility] ORS;  Service: Ophthalmology;  Laterality: Right;  Lot # [id number] [place]:   00:23.9 AP%   :8.7 CDE:   2.10  . Cataracts Bilateral    09/27/17 and 10/25/17  . COLONOSCOPY WITH ESOPHAGOGASTRODUODENOSCOPY (EGD)  12/2017   [facility]  . JOINT REPLACEMENT    . KNEE ARTHROSCOPY Right    x 2  . lumbarectomy with fusion  [id number]  . ROTATOR CUFF REPAIR    . tear duct      surgery  . TMJ ARTHROPLASTY    . TOTAL  KNEE ARTHROPLASTY Bilateral 09/2007   Family History  Problem Relation Age of Onset  . Heart disease Mother   . Hypertension Mother   . Lymphoma Mother 26       deceased 69  . Stroke Father   . Heart attack Father   . Heart disease Father   . Hypertension Father   . Alcohol abuse Father   . Hypertension Sister   . Colon cancer Sister 28  . Hypertension Brother   . Diabetes Brother   . Mental illness Brother   . Depression Brother   . Lung cancer Brother 14       smoker; currently 63  . Alcohol abuse Son   . Breast cancer Maternal Aunt 75       currently 66  . Breast cancer Maternal Aunt        dx 63s; mets in 33s  . AAA (abdominal aortic aneurysm) Maternal Grandmother   . Heart disease Maternal Grandmother   . Heart attack Paternal Grandfather   . Throat cancer Paternal Uncle        smoker   Social History   Socioeconomic History  . Marital status: Married    Spouse name: Dorene Sorrow  . Number of children: 2  . Years of education: Not on file  . Highest education level: Master's degree (e.g., MA, MS, MEng, MEd, MSW, MBA)  Occupational History  . Occupation: retired  Tobacco Use  . Smoking status: Never Smoker  . Smokeless tobacco: Never Used  Vaping Use  . Vaping Use: Never used  Substance and Sexual Activity  . Alcohol use: No    Alcohol/week: 0.0 standard drinks  . Drug use: No  . Sexual activity: Not Currently  Other Topics Concern  . Not on file  Social History Narrative  . Not on file   Social Determinants of Health   Financial Resource Strain: Low Risk   . Difficulty of Paying Living Expenses: Not hard at all  Food Insecurity: No Food Insecurity  . Worried About Programme researcher, broadcasting/film/video in the Last Year: Never true  . Ran Out of Food in the Last Year: Never true  Transportation Needs: No Transportation Needs  . Lack of Transportation (Medical): No  .  Lack of Transportation (Non-Medical): No  Physical Activity: Inactive  . Days of Exercise per Week: 0  days  . Minutes of Exercise per Session: 0 min  Stress: Stress Concern Present  . Feeling of Stress : To some extent  Social Connections: Moderately Integrated  . Frequency of Communication with Friends and Family: More than three times a week  . Frequency of Social Gatherings with Friends and Family: More than three times a week  . Attends Religious Services: More than 4 times per year  . Active Member of Clubs or Organizations: No  . Attends Banker Meetings: Never  . Marital Status: Married  Catering manager Violence: Not At Risk  . Fear of Current or Ex-Partner: No  . Emotionally Abused: No  . Physically Abused: No  . Sexually Abused: No    Chart Review Today: I personally reviewed active problem list, medication list, allergies, family history, social history, health maintenance, notes from last encounter, lab results, imaging with the patient/caregiver today.   Review of Systems  10 Systems reviewed and are negative for acute change except as noted in the HPI.   Objective:    Virtual encounter, vitals limited, only able to obtain the following Today's Vitals   04/01/20 0916  BP: (!) 142/82  Pulse: 66  Weight: 193 lb (87.5 kg)  Height: 5\' 4"  (1.626 m)   Body mass index is 33.13 kg/m. Nursing Note and Vital Signs reviewed.  Physical Exam Vitals and nursing note reviewed.  Constitutional:      General: She is not in acute distress.    Appearance: She is obese. She is not toxic-appearing or diaphoretic.  HENT:     Head: Normocephalic and atraumatic.  Pulmonary:     Breath sounds: Normal breath sounds.  Skin:    Coloration: Skin is not jaundiced or pale.  Neurological:     Mental Status: She is alert. Mental status is at baseline.  Psychiatric:        Mood and Affect: Mood normal.        Behavior: Behavior normal.    Recent Results (from the past 2160 hour(s))  Microalbumin, urine     Status: None   Collection Time: 03/22/20  8:51 AM  Result  Value Ref Range   Microalb, Ur 0.9 mg/dL    Comment: Reference Range Not established    RAM      Comment: . The ADA defines abnormalities in albumin excretion as follows: 03/24/20 Category         Result (mcg/mg creatinine) . Normal                    <30 Microalbuminuria         30-299  Clinical albuminuria   > OR = 300 . The ADA recommends that at least two of three specimens collected within a 3-6 month period be abnormal before considering a patient to be within a diagnostic category.   COMPLETE METABOLIC PANEL WITH GFR     Status: Abnormal   Collection Time: 03/22/20  8:51 AM  Result Value Ref Range   Glucose, Bld 100 (H) 65 - 99 mg/dL    Comment: .            Fasting reference interval . For someone without known diabetes, a glucose value between 100 and 125 mg/dL is consistent with prediabetes and should be confirmed with a follow-up test. .    BUN 11 7 - 25 mg/dL  Creat 0.90 0.50 - 0.99 mg/dL    Comment: For patients >53 years of age, the reference limit for Creatinine is approximately 13% higher for people identified as African-American. .    GFR, Est Non African American 66 > OR = 60 mL/min/1.73m2   GFR, Est African American 76 > OR = 60 mL/min/1.84m2   BUN/Creatinine Ratio NOT APPLICABLE 6 - 22 (calc)   Sodium 137 135 - 146 mmol/L   Potassium 4.6 3.5 - 5.3 mmol/L   Chloride 102 98 - 110 mmol/L   CO2 28 20 - 32 mmol/L   Calcium 10.1 8.6 - 10.4 mg/dL   Total Protein 7.2 6.1 - 8.1 g/dL   Albumin 4.7 3.6 - 5.1 g/dL   Globulin 2.5 1.9 - 3.7 g/dL (calc)   AG Ratio 1.9 1.0 - 2.5 (calc)   Total Bilirubin 0.5 0.2 - 1.2 mg/dL   Alkaline phosphatase (APISO) 39 37 - 153 U/L   AST 21 10 - 35 U/L   ALT 18 6 - 29 U/L  Hemoglobin A1c     Status: Abnormal   Collection Time: 03/22/20  8:51 AM  Result Value Ref Range   Hgb A1c MFr Bld 6.1 (H) <5.7 % of total Hgb    Comment: For someone without known diabetes, a hemoglobin  A1c value between 5.7% and 6.4% is consistent  with prediabetes and should be confirmed with a  follow-up test. . For someone with known diabetes, a value <7% indicates that their diabetes is well controlled. A1c targets should be individualized based on duration of diabetes, age, comorbid conditions, and other considerations. . This assay result is consistent with an increased risk of diabetes. . Currently, no consensus exists regarding use of hemoglobin A1c for diagnosis of diabetes for children. .    Mean Plasma Glucose 128 (calc)   eAG (mmol/L) 7.1 (calc)  Lipase     Status: None   Collection Time: 03/22/20  8:51 AM  Result Value Ref Range   Lipase 23 7 - 60 U/L  CBC with Differential/Platelet     Status: None   Collection Time: 03/22/20  8:51 AM  Result Value Ref Range   WBC 5.8 3.8 - 10.8 Thousand/uL   RBC 4.43 3.80 - 5.10 Million/uL   Hemoglobin 13.0 11.7 - 15.5 g/dL   HCT 16.1 35 - 45 %   MCV 86.9 80.0 - 100.0 fL   MCH 29.3 27.0 - 33.0 pg   MCHC 33.8 32.0 - 36.0 g/dL   RDW 09.6 04.5 - 40.9 %   Platelets 219 140 - 400 Thousand/uL   MPV 10.5 7.5 - 12.5 fL   Neutro Abs 3,335 1,500 - 7,800 cells/uL   Lymphs Abs 1,717 850 - 3,900 cells/uL   Absolute Monocytes 539 200 - 950 cells/uL   Eosinophils Absolute 191 15 - 500 cells/uL   Basophils Absolute 17 0 - 200 cells/uL   Neutrophils Relative % 57.5 %   Total Lymphocyte 29.6 %   Monocytes Relative 9.3 %   Eosinophils Relative 3.3 %   Basophils Relative 0.3 %  Urinalysis, Routine w reflex microscopic     Status: Abnormal   Collection Time: 03/22/20  8:51 AM  Result Value Ref Range   Color, Urine YELLOW YELLOW   APPearance CLEAR CLEAR   Specific Gravity, Urine 1.018 1.001 - 1.03   pH 7.0 5.0 - 8.0   Glucose, UA NEGATIVE NEGATIVE   Bilirubin Urine NEGATIVE NEGATIVE   Ketones, ur NEGATIVE NEGATIVE   Hgb  urine dipstick NEGATIVE NEGATIVE   Protein, ur NEGATIVE NEGATIVE   Nitrite NEGATIVE NEGATIVE   Leukocytes,Ua 2+ (A) NEGATIVE   WBC, UA 0-5 0 - 5 /HPF   RBC  / HPF NONE SEEN 0 - 2 /HPF   Squamous Epithelial / LPF 6-10 (A) < OR = 5 /HPF   Bacteria, UA NONE SEEN NONE SEEN /HPF   Calcium Oxalate Crystal FEW NONE OR FE /HPF   Hyaline Cast NONE SEEN NONE SEEN /LPF  Urine Culture     Status: None   Collection Time: 03/22/20  8:51 AM  Result Value Ref Range   MICRO NUMBER: [id number]    SPECIMEN QUALITY: Adequate    Sample Source URINE    STATUS: FINAL    Result:      Growth of mixed flora was isolated, suggesting probable contamination. No further testing will be performed. If clinically indicated, recollection using a method to minimize contamination, with prompt transfer to Urine Culture Transport Tube, is  recommended.    Imaging:  CLINICAL DATA:  Right upper quadrant abdominal pain.  EXAM: ULTRASOUND ABDOMEN LIMITED RIGHT UPPER QUADRANT  COMPARISON:  03/03/2019  FINDINGS: Gallbladder:  5 mm nonshadowing echogenic focus along the gallbladder wall which may reflect a small polyp. No gallstones or wall thickening visualized. No sonographic Murphy sign noted by sonographer.  Common bile duct:  Diameter: 4 mm  Liver:  Mildly increased parenchymal echogenicity diffusely without a focal lesion identified. Portal vein is patent on color Doppler imaging with normal direction of blood flow towards the liver.  Other: None.  IMPRESSION: 1. No gallstones, evidence of cholecystitis, or biliary dilatation. Possible 5 mm gallbladder polyp. 2. Mildly echogenic liver, nonspecific though compatible with steatosis.   Electronically Signed   By: Sebastian Ache M.D.   On: 03/22/2020 09:43  PE limited by telephone encounter  No results found for this or any previous visit (from the past 72 hour(s)).  PHQ2/9: Depression screen [facility] 2/9 04/01/2020 03/22/2020 10/09/2019 09/05/2019 08/22/2019  Decreased Interest 0 0 0 0 0  Down, Depressed, Hopeless 0 0 0 0 0  PHQ - 2 Score 0 0 0 0 0  Altered sleeping 0 0 1 1 1   Tired, decreased  energy 0 0 1 1 1   Change in appetite 0 0 0 0 0  Feeling bad or failure about yourself  0 0 0 0 0  Trouble concentrating 0 0 0 0 0  Moving slowly or fidgety/restless 0 0 0 0 0  Suicidal thoughts 0 0 0 0 0  PHQ-9 Score 0 0 2 2 2   Difficult doing work/chores Not difficult at all Not difficult at all Not difficult at all Not difficult at all Not difficult at all  Some recent data might be hidden   PHQ-2/9 Result is neg, reviewed  Fall Risk: Fall Risk  04/01/2020 03/22/2020 10/09/2019 09/05/2019 08/14/2019  Falls in the past year? 0 0 0 0 0  Number falls in past yr: 0 0 0 0 0  Injury with Fall? 0 0 0 0 0  Risk for fall due to : - - - - No Fall Risks  Follow up - Falls evaluation completed - - Falls prevention discussed     Assessment and Plan:     ICD-10-CM   1. Pain of upper abdomen  R10.10    upper abd pain is gradually improving - see plan below - still in Ddx PUD GERD/gastritis, biliary colic/dyskinesia, avoid fatty fried foods and overeating  2.  Type 2 diabetes mellitus without complication, without long-term current use of insulin (HCC)  E11.9    stable well controlled - possibly never was T2DM? prediabetes with meds started? not on ACEI/ARB - discussed - would add if BP continues ~140's  3. Hypothyroidism, unspecified type  E03.9 levothyroxine (SYNTHROID) 88 MCG tablet   last TSH was normal with decreased dose (100 mcg down to 75 mcg) but pt feels worse and has gained weight, change to 88 mcg and f/up in 3 months if needed  4. Essential hypertension  I10 metoprolol succinate (TOPROL-XL) 50 MG 24 hr tablet   borderline HTN control - frequently pt has SBP 140's - if >140/80 I would add ACEI or ARB, but some readings lower and better controlled - meds per cardiology  5. Mixed hyperlipidemia  E78.2 ezetimibe-simvastatin (VYTORIN) 10-40 MG tablet   LDL well controlled, pt compliant with statin and tolerating, no concerning SE or labs - LFTS normal, repeat lipids next year, con't work on  diet/lifestyle/exer  6. Gallbladder polyp  K82.4    possible 5mm gallbladder polyp on [place] 03/22/2020, new compared to 02/2019 RUQ [place]  7. Fatty liver disease, nonalcoholic  K76.0    continued signs of hepatic steatosis - continue to work on diet, exercise, lifestyle   GI Sept 13th with her Education officer, museum - I encouraged her to keep taking 40 mg protonix in the evening 2 hours+ after last meal, pepcid BID PRN, eat small meals, avoid food trigger.  If any big episodes of abd pain encouraged her to please f/up with me  Still possibly some biliary colic sx - if having any more frequent episodes I feel surgical referral and eval is appropriate  Small gallbladder polyp - new compared to [place] from a year ago - no short term f/up needed - but 12 month repeat US would be propriate - defer to any GI recommendations  6 month f/up for DM, HTN, HLD   I discussed the assessment and treatment plan with the patient. The patient was provided an opportunity to ask questions and all were answered. The patient agreed with the plan and demonstrated an understanding of the instructions. Pt advised to f/up if recurrent abd pain Advised to f/up sooner than 6 months for thyroid med changes if any sx or concerns   I provided 35+ minutes of non-face-to-face time during this encounter, 25 + min on video and later phone with pt discussing acute complains and reviewing labs and chronic conditions etc.   Remainder of time involved but was not limited to reviewing chart (recent and pertinent OV notes and labs), documentation in EMR, and coordinating care and treatment plan.   Danelle Berry, PA-C 04/01/20 10:06 AM

## 2020-03-31 NOTE — Telephone Encounter (Signed)
Pt was already notified

## 2020-04-01 ENCOUNTER — Telehealth (INDEPENDENT_AMBULATORY_CARE_PROVIDER_SITE_OTHER): Payer: Medicare PPO | Admitting: Family Medicine

## 2020-04-01 ENCOUNTER — Encounter: Payer: Self-pay | Admitting: Family Medicine

## 2020-04-01 VITALS — BP 142/82 | HR 66 | Ht 64.0 in | Wt 193.0 lb

## 2020-04-01 DIAGNOSIS — E039 Hypothyroidism, unspecified: Secondary | ICD-10-CM

## 2020-04-01 DIAGNOSIS — E782 Mixed hyperlipidemia: Secondary | ICD-10-CM

## 2020-04-01 DIAGNOSIS — E119 Type 2 diabetes mellitus without complications: Secondary | ICD-10-CM

## 2020-04-01 DIAGNOSIS — K760 Fatty (change of) liver, not elsewhere classified: Secondary | ICD-10-CM | POA: Diagnosis not present

## 2020-04-01 DIAGNOSIS — R1010 Upper abdominal pain, unspecified: Secondary | ICD-10-CM

## 2020-04-01 DIAGNOSIS — I10 Essential (primary) hypertension: Secondary | ICD-10-CM | POA: Diagnosis not present

## 2020-04-01 DIAGNOSIS — K824 Cholesterolosis of gallbladder: Secondary | ICD-10-CM | POA: Diagnosis not present

## 2020-04-01 MED ORDER — METOPROLOL SUCCINATE ER 50 MG PO TB24: 50.0000 mg | ORAL_TABLET | Freq: Every day | ORAL | 3 refills | Status: DC

## 2020-04-01 MED ORDER — LEVOTHYROXINE SODIUM 88 MCG PO TABS: 88.0000 ug | ORAL_TABLET | Freq: Every day | ORAL | 3 refills | Status: DC

## 2020-04-01 MED ORDER — EZETIMIBE-SIMVASTATIN 10-40 MG PO TABS: 1.0000 | ORAL_TABLET | Freq: Every day | ORAL | 3 refills | Status: DC

## 2020-04-08 DIAGNOSIS — M65341 Trigger finger, right ring finger: Secondary | ICD-10-CM | POA: Diagnosis not present

## 2020-04-08 DIAGNOSIS — M65331 Trigger finger, right middle finger: Secondary | ICD-10-CM | POA: Diagnosis not present

## 2020-04-08 DIAGNOSIS — M152 Bouchard's nodes (with arthropathy): Secondary | ICD-10-CM | POA: Diagnosis not present

## 2020-04-19 DIAGNOSIS — K2100 Gastro-esophageal reflux disease with esophagitis, without bleeding: Secondary | ICD-10-CM | POA: Diagnosis not present

## 2020-04-19 DIAGNOSIS — Z86010 Personal history of colonic polyps: Secondary | ICD-10-CM | POA: Diagnosis not present

## 2020-04-19 DIAGNOSIS — K824 Cholesterolosis of gallbladder: Secondary | ICD-10-CM | POA: Diagnosis not present

## 2020-04-28 DIAGNOSIS — R208 Other disturbances of skin sensation: Secondary | ICD-10-CM | POA: Diagnosis not present

## 2020-04-28 DIAGNOSIS — L728 Other follicular cysts of the skin and subcutaneous tissue: Secondary | ICD-10-CM | POA: Diagnosis not present

## 2020-04-28 DIAGNOSIS — L720 Epidermal cyst: Secondary | ICD-10-CM | POA: Diagnosis not present

## 2020-05-06 ENCOUNTER — Other Ambulatory Visit: Payer: Self-pay | Admitting: Family Medicine

## 2020-05-06 DIAGNOSIS — F3342 Major depressive disorder, recurrent, in full remission: Secondary | ICD-10-CM

## 2020-05-17 ENCOUNTER — Other Ambulatory Visit: Payer: Self-pay

## 2020-05-17 ENCOUNTER — Other Ambulatory Visit: Payer: Self-pay | Admitting: Family Medicine

## 2020-05-17 ENCOUNTER — Ambulatory Visit (INDEPENDENT_AMBULATORY_CARE_PROVIDER_SITE_OTHER): Payer: Medicare PPO

## 2020-05-17 DIAGNOSIS — G8929 Other chronic pain: Secondary | ICD-10-CM

## 2020-05-17 DIAGNOSIS — Z23 Encounter for immunization: Secondary | ICD-10-CM | POA: Diagnosis not present

## 2020-05-17 DIAGNOSIS — M542 Cervicalgia: Secondary | ICD-10-CM

## 2020-05-17 DIAGNOSIS — M797 Fibromyalgia: Secondary | ICD-10-CM

## 2020-05-17 DIAGNOSIS — G8928 Other chronic postprocedural pain: Secondary | ICD-10-CM

## 2020-05-17 DIAGNOSIS — M549 Dorsalgia, unspecified: Secondary | ICD-10-CM

## 2020-05-17 DIAGNOSIS — M5412 Radiculopathy, cervical region: Secondary | ICD-10-CM

## 2020-05-20 DIAGNOSIS — K8020 Calculus of gallbladder without cholecystitis without obstruction: Secondary | ICD-10-CM | POA: Diagnosis not present

## 2020-05-24 DIAGNOSIS — K8020 Calculus of gallbladder without cholecystitis without obstruction: Secondary | ICD-10-CM | POA: Diagnosis not present

## 2020-05-24 DIAGNOSIS — Z01812 Encounter for preprocedural laboratory examination: Secondary | ICD-10-CM | POA: Diagnosis not present

## 2020-05-25 DIAGNOSIS — K8020 Calculus of gallbladder without cholecystitis without obstruction: Secondary | ICD-10-CM | POA: Diagnosis not present

## 2020-05-25 DIAGNOSIS — N809 Endometriosis, unspecified: Secondary | ICD-10-CM | POA: Diagnosis not present

## 2020-05-25 DIAGNOSIS — K2100 Gastro-esophageal reflux disease with esophagitis, without bleeding: Secondary | ICD-10-CM | POA: Diagnosis not present

## 2020-05-25 DIAGNOSIS — K5909 Other constipation: Secondary | ICD-10-CM | POA: Diagnosis not present

## 2020-05-25 DIAGNOSIS — K8080 Other cholelithiasis without obstruction: Secondary | ICD-10-CM | POA: Diagnosis not present

## 2020-05-25 DIAGNOSIS — K828 Other specified diseases of gallbladder: Secondary | ICD-10-CM | POA: Diagnosis not present

## 2020-05-25 DIAGNOSIS — E785 Hyperlipidemia, unspecified: Secondary | ICD-10-CM | POA: Diagnosis not present

## 2020-05-25 DIAGNOSIS — F32A Depression, unspecified: Secondary | ICD-10-CM | POA: Diagnosis not present

## 2020-05-25 DIAGNOSIS — K760 Fatty (change of) liver, not elsewhere classified: Secondary | ICD-10-CM | POA: Diagnosis not present

## 2020-05-25 DIAGNOSIS — R110 Nausea: Secondary | ICD-10-CM | POA: Diagnosis not present

## 2020-05-25 DIAGNOSIS — G35 Multiple sclerosis: Secondary | ICD-10-CM | POA: Diagnosis not present

## 2020-05-25 DIAGNOSIS — I10 Essential (primary) hypertension: Secondary | ICD-10-CM | POA: Diagnosis not present

## 2020-05-25 DIAGNOSIS — E119 Type 2 diabetes mellitus without complications: Secondary | ICD-10-CM | POA: Diagnosis not present

## 2020-05-27 HISTORY — PX: CHOLECYSTECTOMY: SHX55

## 2020-06-03 ENCOUNTER — Other Ambulatory Visit: Payer: Self-pay | Admitting: Family Medicine

## 2020-06-03 DIAGNOSIS — M542 Cervicalgia: Secondary | ICD-10-CM

## 2020-07-12 ENCOUNTER — Other Ambulatory Visit: Payer: Self-pay | Admitting: Family Medicine

## 2020-08-03 ENCOUNTER — Other Ambulatory Visit: Payer: Self-pay | Admitting: Family Medicine

## 2020-08-03 DIAGNOSIS — F3342 Major depressive disorder, recurrent, in full remission: Secondary | ICD-10-CM

## 2020-08-17 ENCOUNTER — Ambulatory Visit: Payer: Medicare PPO

## 2020-08-18 ENCOUNTER — Other Ambulatory Visit: Payer: Self-pay | Admitting: Family Medicine

## 2020-08-18 DIAGNOSIS — R059 Cough, unspecified: Secondary | ICD-10-CM

## 2020-08-18 DIAGNOSIS — R7303 Prediabetes: Secondary | ICD-10-CM

## 2020-08-18 DIAGNOSIS — J4521 Mild intermittent asthma with (acute) exacerbation: Secondary | ICD-10-CM

## 2020-09-01 ENCOUNTER — Other Ambulatory Visit: Payer: Self-pay | Admitting: Family Medicine

## 2020-09-01 DIAGNOSIS — Z981 Arthrodesis status: Secondary | ICD-10-CM | POA: Diagnosis not present

## 2020-09-01 DIAGNOSIS — M542 Cervicalgia: Secondary | ICD-10-CM | POA: Diagnosis not present

## 2020-09-07 ENCOUNTER — Other Ambulatory Visit: Payer: Self-pay | Admitting: Family Medicine

## 2020-09-09 ENCOUNTER — Ambulatory Visit (INDEPENDENT_AMBULATORY_CARE_PROVIDER_SITE_OTHER): Payer: Medicare PPO

## 2020-09-09 DIAGNOSIS — Z0000 Encounter for general adult medical examination without abnormal findings: Secondary | ICD-10-CM

## 2020-09-09 NOTE — Patient Instructions (Signed)
Br[place]any [place] , Thank you for taking time to come for your Medicare Wellness Visit. I appreciate your ongoing commitment to your health goals. Please review the following plan we discussed and let me know if I can assist you in the future.   Screening recommendations/referrals: Colonoscopy: done 01/10/18. Repeat in 2024.  Mammogram: done 10/29/19. Please call [phone] to schedule your mammogram.  Bone Density: done 10/17/18 Recommended yearly ophthalmology/optometry visit for glaucoma screening and checkup Recommended yearly dental visit for hygiene and checkup  Vaccinations: Influenza vaccine: done 05/17/20 Pneumococcal vaccine: done 08/02/17 Tdap vaccine: done 03/27/20 Shingles vaccine: Shingrix discussed. Please contact your pharmacy for coverage information.  Covid-19: done 08/26/19, 09/17/19 & 04/18/20  Advanced directives: Please bring a copy of your health care power of attorney and living will to the office at your convenience.  Conditions/risks identified: Recommend increasing strength and mobility with physical therapy   Next appointment: Follow up in one year for your annual wellness visit    Preventive Care 65 Years and Older, Female Preventive care refers to lifestyle choices and visits with your health care provider that can promote health and wellness. What does preventive care include?  A yearly physical exam. This is also called an annual well check.  Dental exams once or twice a year.  Routine eye exams. Ask your health care provider how often you should have your eyes checked.  Personal lifestyle choices, including:  Daily care of your teeth and gums.  Regular physical activity.  Eating a healthy diet.  Avoiding tobacco and drug use.  Limiting alcohol use.  Practicing safe sex.  Taking low-dose aspirin every day.  Taking vitamin and mineral supplements as recommended by your health care provider. What happens during an annual well check? The services  and screenings done by your health care provider during your annual well check will depend on your age, overall health, lifestyle risk factors, and family history of disease. Counseling  Your health care provider may ask you questions about your:  Alcohol use.  Tobacco use.  Drug use.  Emotional well-being.  Home and relationship well-being.  Sexual activity.  Eating habits.  History of falls.  Memory and ability to understand (cognition).  Work and work Astronomer.  Reproductive health. Screening  You may have the following tests or measurements:  Height, weight, and BMI.  Blood pressure.  Lipid and cholesterol levels. These may be checked every 5 years, or more frequently if you are over 35 years old.  Skin check.  Lung cancer screening. You may have this screening every year starting at age 84 if you have a 30-pack-year history of smoking and currently smoke or have quit within the past 15 years.  Fecal occult blood test (FOBT) of the stool. You may have this test every year starting at age 11.  Flexible sigmoidoscopy or colonoscopy. You may have a sigmoidoscopy every 5 years or a colonoscopy every 10 years starting at age 9.  Hepatitis C blood test.  Hepatitis B blood test.  Sexually transmitted disease (STD) testing.  Diabetes screening. This is done by checking your blood sugar (glucose) after you have not eaten for a while (fasting). You may have this done every 1-3 years.  Bone density scan. This is done to screen for osteoporosis. You may have this done starting at age 58.  Mammogram. This may be done every 1-2 years. Talk to your health care provider about how often you should have regular mammograms. Talk with your health care provider about  your test results, treatment options, and if necessary, the need for more tests. Vaccines  Your health care provider may recommend certain vaccines, such as:  Influenza vaccine. This is recommended every  year.  Tetanus, diphtheria, and acellular pertussis (Tdap, Td) vaccine. You may need a Td booster every 10 years.  Zoster vaccine. You may need this after age 49.  Pneumococcal 13-valent conjugate (PCV13) vaccine. One dose is recommended after age 76.  Pneumococcal polysaccharide (PPSV23) vaccine. One dose is recommended after age 23. Talk to your health care provider about which screenings and vaccines you need and how often you need them. This information is not intended to replace advice given to you by your health care provider. Make sure you discuss any questions you have with your health care provider. Document Released: 08/20/2015 Document Revised: 04/12/2016 Document Reviewed: 05/25/2015 Elsevier Interactive Patient Education  2017 [place] Prevention in the Home Falls can cause injuries. They can happen to people of all ages. There are many things you can do to make your home safe and to help prevent falls. What can I do on the outside of my home?  Regularly fix the edges of walkways and driveways and fix any cracks.  Remove anything that might make you trip as you walk through a door, such as a raised step or threshold.  Trim any bushes or trees on the path to your home.  Use bright outdoor lighting.  Clear any walking paths of anything that might make someone trip, such as rocks or tools.  Regularly check to see if handrails are loose or broken. Make sure that both sides of any steps have handrails.  Any raised decks and porches should have guardrails on the edges.  Have any leaves, snow, or ice cleared regularly.  Use sand or salt on walking paths during winter.  Clean up any spills in your garage right away. This includes oil or grease spills. What can I do in the bathroom?  Use night lights.  Install grab bars by the toilet and in the tub and shower. Do not use towel bars as grab bars.  Use non-skid mats or decals in the tub or shower.  If you  need to sit down in the shower, use a plastic, non-slip stool.  Keep the floor dry. Clean up any water that spills on the floor as soon as it happens.  Remove soap buildup in the tub or shower regularly.  Attach bath mats securely with double-sided non-slip rug tape.  Do not have throw rugs and other things on the floor that can make you trip. What can I do in the bedroom?  Use night lights.  Make sure that you have a light by your bed that is easy to reach.  Do not use any sheets or blankets that are too big for your bed. They should not hang down onto the floor.  Have a firm chair that has side arms. You can use this for support while you get dressed.  Do not have throw rugs and other things on the floor that can make you trip. What can I do in the kitchen?  Clean up any spills right away.  Avoid walking on wet floors.  Keep items that you use a lot in easy-to-reach places.  If you need to reach something above you, use a strong step stool that has a grab bar.  Keep electrical cords out of the way.  Do not use floor polish or wax  that makes floors slippery. If you must use wax, use non-skid floor wax.  Do not have throw rugs and other things on the floor that can make you trip. What can I do with my stairs?  Do not leave any items on the stairs.  Make sure that there are handrails on both sides of the stairs and use them. Fix handrails that are broken or loose. Make sure that handrails are as long as the stairways.  Check any carpeting to make sure that it is firmly attached to the stairs. Fix any carpet that is loose or worn.  Avoid having throw rugs at the top or bottom of the stairs. If you do have throw rugs, attach them to the floor with carpet tape.  Make sure that you have a light switch at the top of the stairs and the bottom of the stairs. If you do not have them, ask someone to add them for you. What else can I do to help prevent falls?  Wear shoes  that:  Do not have high heels.  Have rubber bottoms.  Are comfortable and fit you well.  Are closed at the toe. Do not wear sandals.  If you use a stepladder:  Make sure that it is fully opened. Do not climb a closed stepladder.  Make sure that both sides of the stepladder are locked into place.  Ask someone to hold it for you, if possible.  Clearly mark and make sure that you can see:  Any grab bars or handrails.  First and last steps.  Where the edge of each step is.  Use tools that help you move around (mobility aids) if they are needed. These include:  Canes.  Walkers.  Scooters.  Crutches.  Turn on the lights when you go into a dark area. Replace any light bulbs as soon as they burn out.  Set up your furniture so you have a clear path. Avoid moving your furniture around.  If any of your floors are uneven, fix them.  If there are any pets around you, be aware of where they are.  Review your medicines with your doctor. Some medicines can make you feel dizzy. This can increase your chance of falling. Ask your doctor what other things that you can do to help prevent falls. This information is not intended to replace advice given to you by your health care provider. Make sure you discuss any questions you have with your health care provider. Document Released: 05/20/2009 Document Revised: 12/30/2015 Document Reviewed: 08/28/2014 Elsevier Interactive Patient Education  2017 [employer].

## 2020-09-09 NOTE — Progress Notes (Signed)
Subjective:   Brittany Hill is a 70 y.o. female who presents for Medicare Annual (Subsequent) preventive examination.  Virtual Visit via Telephone Note  I connected with  Brittany Hill on 09/09/20 at  3:30 PM EST by telephone and verified that I am speaking with the correct person using two identifiers.  Location: Patient: home Provider: [facility] Persons participating in the virtual visit: patient/Nurse Health Advisor   I discussed the limitations, risks, security and privacy concerns of performing an evaluation and management service by telephone and the availability of in person appointments. The patient expressed understanding and agreed to proceed.  Interactive audio and video telecommunications were attempted between this nurse and patient, however failed, due to patient having technical difficulties OR patient did not have access to video capability.  We continued and completed visit with audio only.  Some vital signs may be absent or patient reported.   Brittany Littler, LPN    Review of Systems     Cardiac Risk Factors include: advanced age (>46men, >53 women);dyslipidemia;hypertension;diabetes mellitus;obesity (BMI >30kg/m2)     Objective:    Today's Vitals   09/09/20 1544  PainSc: 3    There is no height or weight on file to calculate BMI.  Advanced Directives 09/09/2020 08/14/2019 08/06/2018 10/25/2017 09/27/2017 10/27/2016 07/28/2016  Does Patient Have a Medical Advance Directive? Yes Yes Yes Yes Yes Yes No  Type of Estate agent of [place];Living will Healthcare Power of [place];Living will Healthcare Power of [place];Living will Healthcare Power of [place];Living will Healthcare Power of [place];Living will - -  Does patient want to make changes to medical advance directive? - - - No - Patient declined No - Patient declined - -  Copy of Healthcare Power of Attorney in Chart? No - copy requested No - copy requested No - copy requested No -  copy requested No - copy requested - -  Would patient like information on creating a medical advance directive? - - - - - - -    Current Medications (verified) Outpatient Encounter Medications as of 09/09/2020  Medication Sig  . acetaminophen (TYLENOL) 500 MG tablet Take 1,000 mg by mouth every 6 (six) hours as needed.  . calcium carbonate (OS-CAL) 600 MG TABS tablet Take 600 mg by mouth as needed.   . cholecalciferol (VITAMIN D3) 25 MCG (1000 UNIT) tablet Take 1,000 Units by mouth daily.  . DULoxetine (CYMBALTA) 20 MG capsule Take 2 capsules (40 mg total) by mouth daily.  Marland Kitchen EPINEPHrine 0.3 mg/0.3 mL IJ SOAJ injection Inject 0.3 mg into the muscle once.   . ezetimibe-simvastatin (VYTORIN) 10-40 MG tablet Take 1 tablet by mouth at bedtime.  . famotidine (PEPCID) 20 MG tablet Take 1 tablet (20 mg total) by mouth 2 (two) times daily.  Marland Kitchen levothyroxine (SYNTHROID) 88 MCG tablet Take 1 tablet (88 mcg total) by mouth daily before breakfast.  . meloxicam (MOBIC) 15 MG tablet Take 1 tablet (15 mg total) by mouth daily.  . metFORMIN (GLUCOPHAGE-XR) 500 MG 24 hr tablet Take 1 tablet (500 mg total) by mouth 2 (two) times daily.  . metoprolol succinate (TOPROL-XL) 50 MG 24 hr tablet Take 1 tablet (50 mg total) by mouth at bedtime. Take with or immediately following a meal.  . montelukast (SINGULAIR) 10 MG tablet Take 1 tablet (10 mg total) by mouth at bedtime.  . ondansetron (ZOFRAN ODT) 4 MG disintegrating tablet Take 1 tablet (4 mg total) by mouth every 8 (eight) hours as needed for nausea  or vomiting.  . pantoprazole (PROTONIX) 40 MG tablet Take 1 tablet (40 mg total) by mouth at bedtime.  . pregabalin (LYRICA) 100 MG capsule TAKE 1 CAPSULE IN THE MORNING AND TAKE 2 CAPSULES IN THE EVENING FOR CHRONIC PAIN  . spironolactone (ALDACTONE) 50 MG tablet Take 50 mg by mouth daily. With breakfast  . tiZANidine (ZANAFLEX) 4 MG tablet Take 1-1.5 tablets (4-6 mg total) by mouth every 8 (eight) hours as needed for  muscle spasms.  . [DISCONTINUED] cetirizine (ZYRTEC) 10 MG tablet Take 10 mg by mouth daily.  . [DISCONTINUED] pregabalin (LYRICA) 100 MG capsule Take 100 mg po QAM and 200 mg PO QPM daily for chronic pain (Patient not taking: Reported on 10/24/2019)  . [DISCONTINUED] pregabalin (LYRICA) 100 MG capsule TAKE 1 CAPSULE IN THE MORNING AND TAKE 2 CAPSULES IN THE EVENING FOR CHRONIC PAIN   No facility-administered encounter medications on file as of 09/09/2020.    Allergies (verified) Shellfish allergy, Codeine, and Sulfa antibiotics   History: Past Medical History:  Diagnosis Date  . Allergy   . Anxiety   . Asthma    as a teenager  . Chronic constipation   . Complication of anesthesia   . Depression   . Dysrhythmia   . Fibromyalgia    Bells Palsy  . Full code status 08/02/2017   Patient wishes to have resuscitative efforts made if collapses; but does not want to remain on life support or if condition is terminal -- see copy of living will  . Genetic testing 08/28/2017   Multi-Cancer panel (83 genes) @ [facility] - No pathogenic mutations detected  . GERD (gastroesophageal reflux disease)   . Heart murmur   . Hyperlipidemia   . Hypertension   . Hypothyroidism   . IFG (impaired fasting glucose)   . MS (multiple sclerosis) (HCC)    hx of long-term corticosteroid use  . OA (osteoarthritis)    osteoporosis  . Osteoporosis 05/2014   managed by Rheumatologist  . Pneumonia 2017  . PONV (postoperative nausea and vomiting)   . TMJ syndrome    Past Surgical History:  Procedure Laterality Date  . ABDOMINAL HYSTERECTOMY     complete at age 43; HRT for ~25y  . BACK SURGERY    . CATARACT EXTRACTION W/PHACO Left 09/27/2017   Procedure: CATARACT EXTRACTION PHACO AND INTRAOCULAR LENS PLACEMENT (IOC);  Surgeon: Nevada Crane, MD;  Location: [facility] ORS;  Service: Ophthalmology;  Laterality: Left;  Lot #[id number] H [place]:   00:27.2 AP%:  13.7 CDE:  3.75  . CATARACT EXTRACTION W/PHACO Right  10/25/2017   Procedure: CATARACT EXTRACTION PHACO AND INTRAOCULAR LENS PLACEMENT (IOC);  Surgeon: Nevada Crane, MD;  Location: [facility] ORS;  Service: Ophthalmology;  Laterality: Right;  Lot # [id number] [place]:   00:23.9 AP%   :8.7 CDE:   2.10  . Cataracts Bilateral    09/27/17 and 10/25/17  . CHOLECYSTECTOMY  05/27/2020  . COLONOSCOPY WITH ESOPHAGOGASTRODUODENOSCOPY (EGD)  12/2017   [facility]  . JOINT REPLACEMENT    . KNEE ARTHROSCOPY Right    x 2  . lumbarectomy with fusion  [id number]  . ROTATOR CUFF REPAIR    . tear duct      surgery  . TMJ ARTHROPLASTY    . TOTAL KNEE ARTHROPLASTY Bilateral 09/2007   Family History  Problem Relation Age of Onset  . Heart disease Mother   . Hypertension Mother   . Lymphoma Mother 25       deceased 38  .  Stroke Father   . Heart attack Father   . Heart disease Father   . Hypertension Father   . Alcohol abuse Father   . Hypertension Sister   . Colon cancer Sister 37  . Hypertension Brother   . Diabetes Brother   . Mental illness Brother   . Depression Brother   . Lung cancer Brother 52       smoker; currently 32  . Alcohol abuse Son   . Breast cancer Maternal Aunt 75       currently 76  . Breast cancer Maternal Aunt        dx 100s; mets in 77s  . AAA (abdominal aortic aneurysm) Maternal Grandmother   . Heart disease Maternal Grandmother   . Heart attack Paternal Grandfather   . Throat cancer Paternal Uncle        smoker   Social History   Socioeconomic History  . Marital status: Married    Spouse name: Dorene Sorrow  . Number of children: 2  . Years of education: Not on file  . Highest education level: Master's degree (e.g., MA, MS, MEng, MEd, MSW, MBA)  Occupational History  . Occupation: retired  Tobacco Use  . Smoking status: Never Smoker  . Smokeless tobacco: Never Used  Vaping Use  . Vaping Use: Never used  Substance and Sexual Activity  . Alcohol use: No    Alcohol/week: 0.0 standard drinks  . Drug use: No  .  Sexual activity: Not Currently  Other Topics Concern  . Not on file  Social History Narrative  . Not on file   Social Determinants of Health   Financial Resource Strain: Low Risk   . Difficulty of Paying Living Expenses: Not hard at all  Food Insecurity: No Food Insecurity  . Worried About Programme researcher, broadcasting/film/video in the Last Year: Never true  . Ran Out of Food in the Last Year: Never true  Transportation Needs: No Transportation Needs  . Lack of Transportation (Medical): No  . Lack of Transportation (Non-Medical): No  Physical Activity: Inactive  . Days of Exercise per Week: 0 days  . Minutes of Exercise per Session: 0 min  Stress: Stress Concern Present  . Feeling of Stress : To some extent  Social Connections: Moderately Integrated  . Frequency of Communication with Friends and Family: More than three times a week  . Frequency of Social Gatherings with Friends and Family: More than three times a week  . Attends Religious Services: More than 4 times per year  . Active Member of Clubs or Organizations: No  . Attends Banker Meetings: Never  . Marital Status: Married    Tobacco Counseling Counseling given: Not Answered   Clinical Intake:  Pre-visit preparation completed: Yes  Pain : 0-10 Pain Score: 3  Pain Type: Chronic pain Pain Location: Neck Pain Descriptors / Indicators: Aching,Sore Pain Onset: More than a month ago Pain Frequency: Intermittent     Nutritional Risks: None Diabetes: Yes CBG done?: No Did pt. bring in CBG monitor from home?: No   Nutrition Risk Assessment:  Has the patient had any N/V/D within the last 2 months?  No  Does the patient have any non-healing wounds?  No  Has the patient had any unintentional weight loss or weight gain?  No   Diabetes:  Is the patient diabetic?  Yes  If diabetic, was a CBG obtained today?  No  Did the patient bring in their glucometer from home?  No  How often do you monitor your CBG's? Pt does  not actively check blood sugar.   Financial Strains and Diabetes Management:  Are you having any financial strains with the device, your supplies or your medication? No .  Does the patient want to be seen by Chronic Care Management for management of their diabetes?  No  Would the patient like to be referred to a Nutritionist or for Diabetic Management?  No   Diabetic Exams:  Diabetic Eye Exam: Completed 12/16/19.   Diabetic Foot Exam: Completed 09/27/18. Pt has been advised about the importance in completing this exam. Pt is scheduled for diabetic foot exam on 09/16/20.    How often do you need to have someone help you when you read instructions, pamphlets, or other written materials from your doctor or pharmacy?: 1 - Never    Interpreter Needed?: No  Information entered by :: Brittany LittlerKasey Clydell Sposito LPN   Activities of Daily Living In your present state of health, do you have any difficulty performing the following activities: 09/09/2020 04/01/2020  Hearing? N N  Comment declines hearing aids -  Vision? N N  Difficulty concentrating or making decisions? N N  Walking or climbing stairs? N N  Dressing or bathing? N N  Doing errands, shopping? N N  Preparing Food and eating ? N -  Using the Toilet? N -  In the past six months, have you accidently leaked urine? N -  Do you have problems with loss of bowel control? N -  Managing your Medications? N -  Managing your Finances? N -  Housekeeping or managing your Housekeeping? N -  Some recent data might be hidden    Patient Care Team: Danelle Berryapia, Leisa, PA-C as PCP - General (Family Medicine) Bettey MareGottfried, Oren, MD as Referring Physician (Neurosurgery) Lady GaryFath, Darlin PriestlyKenneth A, MD as Consulting Physician (Cardiology) Tendler, Cliffton Astersavid A (Inactive) as Referring Physician  Indicate any recent Medical Services you may have received from other than [facility] providers in the past year (date may be approximate).     Assessment:   This is a routine wellness examination  for Lisel.  Hearing/Vision screen  Hearing Screening   125Hz  250Hz  500Hz  1000Hz  2000Hz  3000Hz  4000Hz  6000Hz  8000Hz   Right ear:           Left ear:           Comments: Pt denies hearing difficulty  Vision Screening Comments: [facility]. Brooke DareKing  Dietary issues and exercise activities discussed: Current Exercise Habits: The patient does not participate in regular exercise at present, Exercise limited by: orthopedic condition(s)  Goals    . Weight (lb) < 185 lb (83.9 kg)     Over the next year      Depression Screen PHQ 2/9 Scores 09/09/2020 04/01/2020 03/22/2020 10/09/2019 09/05/2019 08/22/2019 08/14/2019  PHQ - 2 Score 0 0 0 0 0 0 0  PHQ- 9 Score - 0 0 2 2 2  -    Fall Risk Fall Risk  09/09/2020 04/01/2020 03/22/2020 10/09/2019 09/05/2019  Falls in the past year? 0 0 0 0 0  Number falls in past yr: 0 0 0 0 0  Injury with Fall? 0 0 0 0 0  Risk for fall due to : No Fall Risks - - - -  Follow up Falls prevention discussed - Falls evaluation completed - -    FALL RISK PREVENTION PERTAINING TO THE HOME:  Any stairs in or around the home? Yes  If  so, are there any without handrails? No  Home free of loose throw rugs in walkways, pet beds, electrical cords, etc? Yes  Adequate lighting in your home to reduce risk of falls? Yes   ASSISTIVE DEVICES UTILIZED TO PREVENT FALLS:  Life alert? No  Use of a cane, walker or w/c? No  Grab bars in the bathroom? Yes  Shower chair or bench in shower? Yes  Elevated toilet seat or a handicapped toilet? Yes   TIMED UP AND GO:  Was the test performed? No . Telephonic visit.   Cognitive Function: Normal cognitive status assessed by direct observation by this Nurse Health Advisor. No abnormalities found.       6CIT Screen 08/06/2018  What Year? 0 points  What month? 0 points  What time? 0 points  Count back from 20 0 points  Months in reverse 0 points  Repeat phrase 0 points  Total Score 0     Immunizations Immunization History  Administered Date(s) Administered  . Fluad Quad(high Dose 65+) 05/02/2019, 05/17/2020  . Influenza, High Dose Seasonal PF 05/16/2018  . Influenza,inj,Quad PF,6+ Mos 06/03/2015  . Influenza-Unspecified 05/17/2012, 05/10/2017  . PFIZER(Purple Top)SARS-COV-2 Vaccination 08/26/2019, 09/17/2019, 04/18/2020  . Pneumococcal Polysaccharide-23 08/02/2017  . Td 06/19/1999  . Tdap 03/27/2020  . Zoster 08/08/2011    TDAP status: Up to date  Flu Vaccine status: Up to date  Pneumococcal vaccine status: Up to date  Covid-19 vaccine status: Completed vaccines  Qualifies for Shingles Vaccine? Yes   Zostavax completed Yes   Shingrix Completed?: No.    Education has been provided regarding the importance of this vaccine. Patient has been advised to call insurance company to determine out of pocket expense if they have not yet received this vaccine. Advised may also receive vaccine at local pharmacy or Health Dept. Verbalized acceptance and understanding.  Screening Tests Health Maintenance  Topic Date Due  . FOOT EXAM  09/28/2019  . HEMOGLOBIN A1C  09/22/2020  . DEXA SCAN  10/16/2020  . MAMMOGRAM  10/28/2020  . OPHTHALMOLOGY EXAM  12/15/2020  . URINE MICROALBUMIN  03/22/2021  . COLONOSCOPY (Pts 45-20yrs Insurance coverage will need to be confirmed)  01/11/2023  . TETANUS/TDAP  03/27/2030  . INFLUENZA VACCINE  Completed  . COVID-19 Vaccine  Completed  . PNA vac Low Risk Adult  Completed  . Hepatitis C Screening  Addressed    Health Maintenance  Health Maintenance Due  Topic Date Due  . FOOT EXAM  09/28/2019    Colorectal cancer screening: Type of screening: Colonoscopy. Completed 01/10/18. Repeat every 5 years   Mammogram status: Completed 10/29/19. Repeat every year  Bone Density status: Completed 10/17/18. Results reflect: Bone density results: OSTEOPENIA. Repeat every 2 years.  Lung Cancer Screening: (Low Dose CT Chest recommended if Age  35-80 years, 30 pack-year currently smoking OR have quit w/in 15years.) does not qualify.   Additional Screening:  Hepatitis C Screening: does qualify; Completed 05/07/14  Vision Screening: Recommended annual ophthalmology exams for early detection of glaucoma and other disorders of the eye. Is the patient up to date with their annual eye exam?  Yes  Who is the provider or what is the name of the office in which the patient attends annual eye exams? [facility]  Dental Screening: Recommended annual dental exams for proper oral hygiene  Community Resource Referral / Chronic Care Management: CRR required this visit?  No   CCM required this visit?  No      Plan:  I have personally reviewed and noted the following in the patient's chart:   . Medical and social history . Use of alcohol, tobacco or illicit drugs  . Current medications and supplements . Functional ability and status . Nutritional status . Physical activity . Advanced directives . List of other physicians . Hospitalizations, surgeries, and ER visits in previous 12 months . Vitals . Screenings to include cognitive, depression, and falls . Referrals and appointments  In addition, I have reviewed and discussed with patient certain preventive protocols, quality metrics, and best practice recommendations. A written personalized care plan for preventive services as well as general preventive health recommendations were provided to patient.     Brittany Littler, LPN   09/10/5571   Nurse Notes: none

## 2020-09-16 ENCOUNTER — Ambulatory Visit: Payer: Medicare PPO | Admitting: Family Medicine

## 2020-09-16 ENCOUNTER — Other Ambulatory Visit: Payer: Self-pay

## 2020-09-16 ENCOUNTER — Encounter: Payer: Self-pay | Admitting: Family Medicine

## 2020-09-16 VITALS — BP 126/68 | HR 91 | Temp 98.0°F | Resp 16 | Ht 64.0 in | Wt 195.0 lb

## 2020-09-16 DIAGNOSIS — I10 Essential (primary) hypertension: Secondary | ICD-10-CM | POA: Diagnosis not present

## 2020-09-16 DIAGNOSIS — G629 Polyneuropathy, unspecified: Secondary | ICD-10-CM

## 2020-09-16 DIAGNOSIS — M549 Dorsalgia, unspecified: Secondary | ICD-10-CM

## 2020-09-16 DIAGNOSIS — Z5181 Encounter for therapeutic drug level monitoring: Secondary | ICD-10-CM

## 2020-09-16 DIAGNOSIS — F3342 Major depressive disorder, recurrent, in full remission: Secondary | ICD-10-CM

## 2020-09-16 DIAGNOSIS — Z1231 Encounter for screening mammogram for malignant neoplasm of breast: Secondary | ICD-10-CM

## 2020-09-16 DIAGNOSIS — E782 Mixed hyperlipidemia: Secondary | ICD-10-CM | POA: Diagnosis not present

## 2020-09-16 DIAGNOSIS — K219 Gastro-esophageal reflux disease without esophagitis: Secondary | ICD-10-CM

## 2020-09-16 DIAGNOSIS — G8929 Other chronic pain: Secondary | ICD-10-CM

## 2020-09-16 DIAGNOSIS — J069 Acute upper respiratory infection, unspecified: Secondary | ICD-10-CM

## 2020-09-16 DIAGNOSIS — M797 Fibromyalgia: Secondary | ICD-10-CM

## 2020-09-16 DIAGNOSIS — E119 Type 2 diabetes mellitus without complications: Secondary | ICD-10-CM | POA: Diagnosis not present

## 2020-09-16 DIAGNOSIS — Z780 Asymptomatic menopausal state: Secondary | ICD-10-CM

## 2020-09-16 DIAGNOSIS — E039 Hypothyroidism, unspecified: Secondary | ICD-10-CM

## 2020-09-16 MED ORDER — FAMOTIDINE 20 MG PO TABS: 20.0000 mg | ORAL_TABLET | Freq: Two times a day (BID) | ORAL | 5 refills | Status: DC | PRN

## 2020-09-16 MED ORDER — DULOXETINE HCL 20 MG PO CPEP
ORAL_CAPSULE | ORAL | 1 refills | Status: DC
Start: 2020-09-16 — End: 2021-05-05

## 2020-09-16 MED ORDER — PREGABALIN 100 MG PO CAPS: ORAL_CAPSULE | ORAL | 2 refills | Status: DC

## 2020-09-16 MED ORDER — PANTOPRAZOLE SODIUM 40 MG PO TBEC: 40.0000 mg | DELAYED_RELEASE_TABLET | Freq: Every day | ORAL | 3 refills | Status: DC

## 2020-09-16 MED ORDER — TIZANIDINE HCL 4 MG PO TABS: 4.0000 mg | ORAL_TABLET | Freq: Three times a day (TID) | ORAL | 1 refills | Status: DC | PRN

## 2020-09-16 NOTE — Progress Notes (Signed)
Name: Brittany Hill   MRN: [id number]    DOB: 12/07/1950   Date:09/16/2020       Progress Note  Chief Complaint  Patient presents with  . Follow-up  . Diabetes  . Hypertension  . Hyperlipidemia     Subjective:   Brittany Hill is a 70 y.o. female, presents to clinic for routine f/up:   Type 2 diabetes mellitus without complication, without long-term current use of insulin (HCC) Lab Results  Component Value Date   HGBA1C 6.1 (H) 03/22/2020  Managed with Metformin 500 mg twice daily, good compliance, not checking blood sugars Due for foot exam - done today She is on simvastatin but not on an ACE inhibitor or an ARB   Hypothyroidism, unspecified type On levothyroxine 88 mcg, good compliance Lab Results  Component Value Date   TSH 1.36 12/24/2019  We will repeat TSH today She has not had any palpitations, unintentional weight loss, sweats, hot or cold intolerance she denies any hair or skin changes, she does have some worsening fatigue chronic pain and paresthesias that she is working with the neuro and spine specialist regarding that No worsening mood, cold intolerance, constipation, weight gain She takes medication in the morning on empty stomach and avoids other supplements and acid reflux medicines and takes those at bedtime   Essential hypertension Managed on spironolactone and metoprolol she [facility] last cardiology visit was reviewed it was August 2021 Reviewed last assessment and plan: Assessment and Plan   70 y.o. female with  ICD-10-CM ICD-9-CM  1. Chest heaviness-no obvious ischemia noted on EKG. Will continue to closely follow. Does not appear to be ischemic in nature. Symptoms have improved quite a bit. R07.89 786.59  2. Mixed hyperlipidemia continue with ezetimibe-simvastatin with a LDL goal of less than 100. Low-fat diet is also recommended. E78.2 272.2  3. Essential hypertension-we will continue with spironolactone  at 50 mg daily and continue with metoprolol as well. Have her check her pressure when she feels good at home to guide further therapy. I10 401.9  4. Type 2 diabetes mellitus without complication, unspecified whether long term insulin use (CMS-HCC) E11.9 250.00   Return in about 6 months (around 09/18/2020). BP Readings from Last 3 Encounters:  09/16/20 126/68  04/01/20 (!) 142/82  03/22/20 124/78  Patient denies any current palpitations, chest pain, orthopnea, PND, lower extremity edema, exertional symptoms  Mixed hyperlipidemia Compliant with Zetia simvastatin per cardiology LDL goal is to be less than 100 Lab Results  Component Value Date   CHOL 150 07/11/2019   HDL 47 (L) 07/11/2019   LDLCALC 78 07/11/2019   TRIG 148 07/11/2019   CHOLHDL 3.2 07/11/2019  Last lipids reviewed, she was at goal December 2020 is due for labs She reports good compliance without myalgias side effects or concerns she is currently on limited with lifestyle and exercise but overall tries to eat healthy   Fibromyalgia Patient has history of fibromyalgia and also multiple spinal surgeries chronic neck and back pain and today she also notes neuropathy from diagnosis of MS She reports that she has had worsening of her chronic pain over the past several months she would like to increase doses of medications that she is able to and she is following up with her spinal specialist Currently on Cymbalta 40 mg, Lyrica 100 mg in the morning and 200 in the evening and she still using tizanidine as needed  Gastroesophageal reflux disease without esophagitis Patient has  consulted with GI did an upper endoscopy and was told she needs to be on PPI every day she continues to use Protonix daily and needs Pepcid as needed - pantoprazole (PROTONIX) 40 MG tablet; Take 1 tablet (40 mg total) by mouth at bedtime.  Dispense: 90 tablet; Refill: 3 - famotidine (PEPCID) 20 MG tablet; Take 1 tablet (20 mg total) by mouth 2 (two) times  daily as needed for heartburn or indigestion.  Dispense: 60 tablet; Refill: 5  Nausea She reports her nausea did improve when she had cholecystectomy -she no longer has nausea   Health Maintenance  Topic Date Due  . HEMOGLOBIN A1C  09/22/2020  . DEXA SCAN  10/16/2020  . MAMMOGRAM  10/28/2020  . OPHTHALMOLOGY EXAM  12/15/2020  . URINE MICROALBUMIN  03/22/2021  . FOOT EXAM  09/16/2021  . COLONOSCOPY (Pts 45-34yrs Insurance coverage will need to be confirmed)  01/11/2023  . TETANUS/TDAP  03/27/2030  . INFLUENZA VACCINE  Completed  . COVID-19 Vaccine  Completed  . PNA vac Low Risk Adult  Completed  . Hepatitis C Screening  Addressed   Patient is due for some screenings Encounter for screening mammogram for malignant neoplasm of breast Due for mammogram no breast changes - MM 3D SCREEN BREAST BILATERAL; Future  Postmenopausal estrogen deficiency Due for bone density - DG Bone Density; Future   Upper respiratory tract infection, unspecified type Patient endorses URI symptoms for the past couple weeks she is tested negative home with antigen testing Endorses nasal congestion postnasal drip  MDD (major depressive disorder), recurrent, in full remission (HCC) Feels mood is still fairly well controlled and she does not feel depressed but she is struggling with worsening chronic pain and does want to try increasing her Cymbalta dose Depression screen [facility] 2/9 09/16/2020 09/09/2020 04/01/2020  Decreased Interest 0 0 0  Down, Depressed, Hopeless 0 0 0  PHQ - 2 Score 0 0 0  Altered sleeping 0 - 0  Tired, decreased energy 0 - 0  Change in appetite 0 - 0  Feeling bad or failure about yourself  0 - 0  Trouble concentrating 0 - 0  Moving slowly or fidgety/restless 0 - 0  Suicidal thoughts 0 - 0  PHQ-9 Score 0 - 0  Difficult doing work/chores Not difficult at all - Not difficult at all  Some recent data might be hidden  PHQ-9 reviewed   Chronic back pain, unspecified back location,  unspecified back pain laterality -worsening neck and back pain as noted above  Worsening neuropathy especially to right upper extremity down to her hands-she has follow-up with her specialist Pain is worse at night   Current Outpatient Medications:  .  acetaminophen (TYLENOL) 500 MG tablet, Take 1,000 mg by mouth every 6 (six) hours as needed., Disp: , Rfl:  .  calcium carbonate (OS-CAL) 600 MG TABS tablet, Take 600 mg by mouth as needed. , Disp: , Rfl:  .  cholecalciferol (VITAMIN D3) 25 MCG (1000 UNIT) tablet, Take 1,000 Units by mouth daily., Disp: , Rfl:  .  DULoxetine (CYMBALTA) 20 MG capsule, Take 2 capsules (40 mg total) by mouth daily., Disp: 180 capsule, Rfl: 1 .  EPINEPHrine 0.3 mg/0.3 mL IJ SOAJ injection, Inject 0.3 mg into the muscle once. , Disp: , Rfl:  .  ezetimibe-simvastatin (VYTORIN) 10-40 MG tablet, Take 1 tablet by mouth at bedtime., Disp: 90 tablet, Rfl: 3 .  levothyroxine (SYNTHROID) 88 MCG tablet, Take 1 tablet (88 mcg total) by mouth daily before  breakfast., Disp: 90 tablet, Rfl: 3 .  meloxicam (MOBIC) 15 MG tablet, Take 1 tablet (15 mg total) by mouth daily., Disp: 90 tablet, Rfl: 1 .  metFORMIN (GLUCOPHAGE-XR) 500 MG 24 hr tablet, Take 1 tablet (500 mg total) by mouth 2 (two) times daily., Disp: 180 tablet, Rfl: 0 .  metoprolol succinate (TOPROL-XL) 50 MG 24 hr tablet, Take 1 tablet (50 mg total) by mouth at bedtime. Take with or immediately following a meal., Disp: 90 tablet, Rfl: 3 .  montelukast (SINGULAIR) 10 MG tablet, Take 1 tablet (10 mg total) by mouth at bedtime., Disp: 30 tablet, Rfl: 0 .  ondansetron (ZOFRAN ODT) 4 MG disintegrating tablet, Take 1 tablet (4 mg total) by mouth every 8 (eight) hours as needed for nausea or vomiting., Disp: 20 tablet, Rfl: 1 .  pregabalin (LYRICA) 100 MG capsule, TAKE 1 CAPSULE IN THE MORNING AND TAKE 2 CAPSULES IN THE EVENING FOR CHRONIC PAIN, Disp: 270 capsule, Rfl: 0 .  spironolactone (ALDACTONE) 50 MG tablet, Take 50 mg by  mouth daily. With breakfast, Disp: , Rfl:  .  tiZANidine (ZANAFLEX) 4 MG tablet, Take 1-1.5 tablets (4-6 mg total) by mouth every 8 (eight) hours as needed for muscle spasms., Disp: 90 tablet, Rfl: 0 .  famotidine (PEPCID) 20 MG tablet, Take 1 tablet (20 mg total) by mouth 2 (two) times daily as needed for heartburn or indigestion., Disp: 60 tablet, Rfl: 5 .  pantoprazole (PROTONIX) 40 MG tablet, Take 1 tablet (40 mg total) by mouth at bedtime., Disp: 90 tablet, Rfl: 3  Patient Active Problem List   Diagnosis Date Noted  . Type 2 diabetes mellitus without complication, without long-term current use of insulin (HCC) 03/22/2020  . Acquired trigger finger 01/06/2020  . Bouchard's nodes (with arthropathy) 01/06/2020  . Chronic post-operative pain 09/05/2019  . Neck pain 09/05/2019  . Cervical radiculopathy 09/05/2019  . Displacement of cervical intervertebral disc 05/14/2019  . Fatty liver disease, nonalcoholic 03/28/2019  . Osteopenia 10/20/2018  . Obesity (BMI 30.0-34.9) 09/27/2018  . Cataract, left 04/02/2018  . Osteoarthritis of left ankle 02/16/2018  . Asthma without status asthmaticus 10/05/2017  . Genetic testing 08/28/2017  . Family hx-breast malignancy 08/02/2017  . Dense breast tissue on mammogram 07/28/2016  . Left sided sciatica 12/19/2015  . Left lumbar radiculopathy 12/16/2015  . Hypomagnesemia 12/06/2015  . Hypokalemia 12/02/2015  . Fibromyalgia   . MS (multiple sclerosis) (HCC)   . Chronic constipation   . OA (osteoarthritis)   . Hypothyroidism   . IFG (impaired fasting glucose)   . Hypertension   . Hyperlipidemia   . GERD (gastroesophageal reflux disease)   . MDD (major depressive disorder), recurrent, in full remission (HCC)   . Anxiety   . TMJ syndrome     Past Surgical History:  Procedure Laterality Date  . ABDOMINAL HYSTERECTOMY     complete at age 77; HRT for ~25y  . BACK SURGERY    . CATARACT EXTRACTION W/PHACO Left 09/27/2017   Procedure: CATARACT  EXTRACTION PHACO AND INTRAOCULAR LENS PLACEMENT (IOC);  Surgeon: Nevada Crane, MD;  Location: [facility] ORS;  Service: Ophthalmology;  Laterality: Left;  Lot #[id number] H [place]:   00:27.2 AP%:  13.7 CDE:  3.75  . CATARACT EXTRACTION W/PHACO Right 10/25/2017   Procedure: CATARACT EXTRACTION PHACO AND INTRAOCULAR LENS PLACEMENT (IOC);  Surgeon: Nevada Crane, MD;  Location: [facility] ORS;  Service: Ophthalmology;  Laterality: Right;  Lot # [id number] [place]:   00:23.9 AP%   :8.7 CDE:  2.10  . Cataracts Bilateral    09/27/17 and 10/25/17  . CHOLECYSTECTOMY  05/27/2020  . COLONOSCOPY WITH ESOPHAGOGASTRODUODENOSCOPY (EGD)  12/2017   [facility]  . JOINT REPLACEMENT    . KNEE ARTHROSCOPY Right    x 2  . lumbarectomy with fusion  [id number]  . ROTATOR CUFF REPAIR    . tear duct      surgery  . TMJ ARTHROPLASTY    . TOTAL KNEE ARTHROPLASTY Bilateral 09/2007    Family History  Problem Relation Age of Onset  . Heart disease Mother   . Hypertension Mother   . Lymphoma Mother 62       deceased 54  . Stroke Father   . Heart attack Father   . Heart disease Father   . Hypertension Father   . Alcohol abuse Father   . Hypertension Sister   . Colon cancer Sister 31  . Hypertension Brother   . Diabetes Brother   . Mental illness Brother   . Depression Brother   . Lung cancer Brother 11       smoker; currently 25  . Alcohol abuse Son   . Breast cancer Maternal Aunt 75       currently 42  . Breast cancer Maternal Aunt        dx 37s; mets in 44s  . AAA (abdominal aortic aneurysm) Maternal Grandmother   . Heart disease Maternal Grandmother   . Heart attack Paternal Grandfather   . Throat cancer Paternal Uncle        smoker    Social History   Tobacco Use  . Smoking status: Never Smoker  . Smokeless tobacco: Never Used  Vaping Use  . Vaping Use: Never used  Substance Use Topics  . Alcohol use: No    Alcohol/week: 0.0 standard drinks  . Drug use: No     Allergies  Allergen  Reactions  . Shellfish Allergy Rash and Swelling  . Codeine Nausea And Vomiting  . Sulfa Antibiotics Swelling    Health Maintenance  Topic Date Due  . FOOT EXAM  09/28/2019  . HEMOGLOBIN A1C  09/22/2020  . DEXA SCAN  10/16/2020  . MAMMOGRAM  10/28/2020  . OPHTHALMOLOGY EXAM  12/15/2020  . URINE MICROALBUMIN  03/22/2021  . COLONOSCOPY (Pts 45-49yrs Insurance coverage will need to be confirmed)  01/11/2023  . TETANUS/TDAP  03/27/2030  . INFLUENZA VACCINE  Completed  . COVID-19 Vaccine  Completed  . PNA vac Low Risk Adult  Completed  . Hepatitis C Screening  Addressed    Chart Review Today: I personally reviewed active problem list, medication list, allergies, family history, social history, health maintenance, notes from last encounter, lab results, imaging with the patient/caregiver today.   Review of Systems  Constitutional: Negative.   HENT: Negative.   Eyes: Negative.   Respiratory: Negative.   Cardiovascular: Negative.   Gastrointestinal: Negative.   Endocrine: Negative.   Genitourinary: Negative.   Musculoskeletal: Negative.   Skin: Negative.   Allergic/Immunologic: Negative.   Neurological: Negative.   Hematological: Negative.   Psychiatric/Behavioral: Negative.   All other systems reviewed and are negative.    Objective:   Vitals:   09/16/20 0857  BP: 126/68  Pulse: 91  Resp: 16  Temp: 98 F (36.7 C)  SpO2: 96%  Weight: 195 lb (88.5 kg)  Height: 5\' 4"  (1.626 m)    Body mass index is 33.47 kg/m.  Physical Exam Vitals and nursing note reviewed.  Constitutional:  General: She is not in acute distress.    Appearance: Normal appearance. She is well-developed. She is obese. She is not ill-appearing, toxic-appearing or diaphoretic.     Interventions: Face mask in place.  HENT:     Head: Normocephalic and atraumatic.     Right Ear: External ear normal.     Left Ear: External ear normal.  Eyes:     General: Lids are normal. No scleral icterus.        Right eye: No discharge.        Left eye: No discharge.     Conjunctiva/sclera: Conjunctivae normal.  Neck:     Trachea: Phonation normal. No tracheal deviation.  Cardiovascular:     Rate and Rhythm: Normal rate and regular rhythm.     Pulses: Normal pulses.          Radial pulses are 2+ on the right side and 2+ on the left side.     Heart sounds: Normal heart sounds. No murmur heard. No friction rub. No gallop.   Pulmonary:     Effort: Pulmonary effort is normal. No respiratory distress.     Breath sounds: Normal breath sounds. No stridor. No wheezing, rhonchi or rales.  Chest:     Chest wall: No tenderness.  Abdominal:     General: Bowel sounds are normal. There is no distension.     Palpations: Abdomen is soft.  Skin:    General: Skin is warm and dry.     Coloration: Skin is not jaundiced or pale.     Findings: No rash.  Neurological:     Mental Status: She is alert.     Gait: Gait normal.  Psychiatric:        Mood and Affect: Mood normal.        Speech: Speech normal.        Behavior: Behavior normal.       Diabetic Foot Exam - Simple   Simple Foot Form Diabetic Foot exam was performed with the following findings: Yes 09/16/2020  9:30 AM  Visual Inspection Sensation Testing Pulse Check Comments Multiple areas of decreased sensation secondary to back issues, surgeries and MS      Assessment & Plan:   1. Type 2 diabetes mellitus without complication, without long-term current use of insulin (HCC) Foot exam done, due for labs Tolerating Metformin 500 mg twice daily - Hemoglobin A1C - COMPLETE METABOLIC PANEL WITH GFR  2. Hypothyroidism, unspecified type Good compliance with her medications and she is taking correctly will recheck labs she does have some worsening chronic pain that seems to be affecting mood and energy and I would like to rule out that she is not chemically hypothyroid with her paresthesias and swelling - TSH - CBC with  Differential/Platelet  3. Essential hypertension Stable, well-controlled, reviewed office visit notes and evaluation from [facility] cardiology Blood pressure at goal today  - COMPLETE METABOLIC PANEL WITH GFR  4. Mixed hyperlipidemia Good statin compliance, no myalgias side effects or concerns, she is due for repeat labs - Lipid panel - COMPLETE METABOLIC PANEL WITH GFR  5. Fibromyalgia Worsening pain, see below -plan to increase med doses -checked dosing parameters per her renal function explained that we do need to monitor for polypharmacy and be careful because of her age Lyrica med dose increase, muscle relaxer refilled and Cymbalta increased - pregabalin (LYRICA) 100 MG capsule; Take 100 mg po qam, 100 mg po q lunch, and 200 mg po at bedtime  Dispense:  360 capsule; Refill: 2 - tiZANidine (ZANAFLEX) 4 MG tablet; Take 1-1.5 tablets (4-6 mg total) by mouth every 8 (eight) hours as needed for muscle spasms.  Dispense: 270 tablet; Refill: 1 - DULoxetine (CYMBALTA) 20 MG capsule; Take 2 tabs (40 mg) po qam and 1 tab (20 mg) po qpm aily  Dispense: 270 capsule; Refill: 1  6. Gastroesophageal reflux disease without esophagitis Symptoms well controlled with her current medications, she has consulted with GI and done an EGD in the past she was told to continue PPI we discussed long-term possible side effects and risk with daily PPI use and we will monitor for that, patient taking these medications at bedtime and more than 4 hours separate from her thyroid medications - pantoprazole (PROTONIX) 40 MG tablet; Take 1 tablet (40 mg total) by mouth at bedtime.  Dispense: 90 tablet; Refill: 3 - famotidine (PEPCID) 20 MG tablet; Take 1 tablet (20 mg total) by mouth 2 (two) times daily as needed for heartburn or indigestion.  Dispense: 60 tablet; Refill: 5  7. Encounter for screening mammogram for malignant neoplasm of breast - MM 3D SCREEN BREAST BILATERAL; Future  8. Postmenopausal estrogen  deficiency - DG Bone Density; Future  9. Encounter for medication monitoring - Hemoglobin A1C - Lipid panel - COMPLETE METABOLIC PANEL WITH GFR - TSH - CBC with Differential/Platelet  10. Upper respiratory tract infection, unspecified type Upper respiratory symptoms x1 week likely viral, treatment and management supportive and symptomatic Quarantine until Covid test is back she has tested at home and it was negative when she had worse symptoms If positive would isolate from beginning of her symptoms x10 days - Novel Coronavirus, NAA ([facility])  11. MDD (major depressive disorder), recurrent, in full remission (HCC) PHQ-9 reviewed and negative today but she does appear to be in pain She wished to increase Cymbalta dose we will monitor her mood and monitor for polypharmacy - DULoxetine (CYMBALTA) 20 MG capsule; Take 2 tabs (40 mg) po qam and 1 tab (20 mg) po qpm aily  Dispense: 270 capsule; Refill: 1  12. Chronic back pain, unspecified back location, unspecified back pain laterality Med doses increase as able, she is following with her spine specialist We will do close follow-up in 6 weeks to see if medication adjustments and dose increases have been helpful - pregabalin (LYRICA) 100 MG capsule; Take 100 mg po qam, 100 mg po q lunch, and 200 mg po at bedtime  Dispense: 360 capsule; Refill: 2 - tiZANidine (ZANAFLEX) 4 MG tablet; Take 1-1.5 tablets (4-6 mg total) by mouth every 8 (eight) hours as needed for muscle spasms.  Dispense: 270 tablet; Refill: 1 - DULoxetine (CYMBALTA) 20 MG capsule; Take 2 tabs (40 mg) po qam and 1 tab (20 mg) po qpm aily  Dispense: 270 capsule; Refill: 1  13. Neuropathy Same as above - pregabalin (LYRICA) 100 MG capsule; Take 100 mg po qam, 100 mg po q lunch, and 200 mg po at bedtime  Dispense: 360 capsule; Refill: 2 - DULoxetine (CYMBALTA) 20 MG capsule; Take 2 tabs (40 mg) po qam and 1 tab (20 mg) po qpm aily  Dispense: 270 capsule; Refill: 1  Worse pain -  fibromyalgia, acute on chronic neck/back pain and neuropathy to arms/hands Dose increase on cymbalta, lyrica and recheck tsh She is seeing spine specialists for f/up and repeated eval/imaging  GERD was told to take PPI daily  Due for recheck on labs for HLD, HTN, DM Foot exam done   Return in  about 6 weeks (around 10/28/2020) for med recheck/pain f/up (virtual ok) and 4 month routine f/up.   Danelle Berry, PA-C 09/16/20 9:29 AM

## 2020-09-16 NOTE — Patient Instructions (Signed)
Health Maintenance  Topic Date Due  . Complete foot exam   09/28/2019  . Hemoglobin A1C  09/22/2020  . DEXA scan (bone density measurement)  10/16/2020  . Mammogram  10/28/2020  . Eye exam for diabetics  12/15/2020  . Urine Protein Check  03/22/2021  . Colon Cancer Screening  01/11/2023  . Tetanus Vaccine  03/27/2030  . Flu Shot  Completed  . COVID-19 Vaccine  Completed  . Pneumonia vaccines  Completed  .  Hepatitis C: One time screening is recommended by Center for Disease Control  (CDC) for  adults born from [birth year] through 1965.   Addressed   [facility] at [facility] [address] [place],  [place]  [address] Get Driving Directions Main: [phone]   See med changes - we will talk in about 6 weeks to recheck you med doses

## 2020-09-17 LAB — HEMOGLOBIN A1C
Hgb A1c MFr Bld: 6 % of total Hgb — ABNORMAL HIGH (ref <5.7)
Mean Plasma Glucose: 126 mg/dL
eAG (mmol/L): 7 mmol/L

## 2020-09-17 LAB — LIPID PANEL
Cholesterol: 156 mg/dL (ref <200)
HDL: 49 mg/dL — ABNORMAL LOW (ref >=50)
LDL Cholesterol (Calc): 79 mg/dL (calc)
Non-HDL Cholesterol (Calc): 107 mg/dL (calc) (ref <130)
Total CHOL/HDL Ratio: 3.2 (calc) (ref <5.0)
Triglycerides: 189 mg/dL — ABNORMAL HIGH (ref <150)

## 2020-09-17 LAB — COMPLETE METABOLIC PANEL WITH GFR
AG Ratio: 1.8 (calc) (ref 1.0–2.5)
ALT: 17 U/L (ref 6–29)
AST: 22 U/L (ref 10–35)
Albumin: 4.8 g/dL (ref 3.6–5.1)
Alkaline phosphatase (APISO): 53 U/L (ref 37–153)
BUN: 9 mg/dL (ref 7–25)
CO2: 29 mmol/L (ref 20–32)
Calcium: 10.5 mg/dL — ABNORMAL HIGH (ref 8.6–10.4)
Chloride: 102 mmol/L (ref 98–110)
Creat: 0.86 mg/dL (ref 0.50–0.99)
GFR, Est African American: 80 mL/min/{1.73_m2} (ref >=60)
GFR, Est Non African American: 69 mL/min/{1.73_m2} (ref >=60)
Globulin: 2.7 g/dL (calc) (ref 1.9–3.7)
Glucose, Bld: 96 mg/dL (ref 65–99)
Potassium: 4.5 mmol/L (ref 3.5–5.3)
Sodium: 140 mmol/L (ref 135–146)
Total Bilirubin: 0.6 mg/dL (ref 0.2–1.2)
Total Protein: 7.5 g/dL (ref 6.1–8.1)

## 2020-09-17 LAB — SARS-COV-2, NAA 2 DAY TAT

## 2020-09-17 LAB — CBC WITH DIFFERENTIAL/PLATELET
Absolute Monocytes: 653 cells/uL (ref 200–950)
Basophils Absolute: 28 cells/uL (ref 0–200)
Basophils Relative: 0.3 %
Eosinophils Absolute: 239 cells/uL (ref 15–500)
Eosinophils Relative: 2.6 %
HCT: 40.5 % (ref 35.0–45.0)
Hemoglobin: 13.6 g/dL (ref 11.7–15.5)
Lymphs Abs: 1693 cells/uL (ref 850–3900)
MCH: 29.1 pg (ref 27.0–33.0)
MCHC: 33.6 g/dL (ref 32.0–36.0)
MCV: 86.7 fL (ref 80.0–100.0)
MPV: 10.4 fL (ref 7.5–12.5)
Monocytes Relative: 7.1 %
Neutro Abs: 6587 cells/uL (ref 1500–7800)
Neutrophils Relative %: 71.6 %
Platelets: 227 10*3/uL (ref 140–400)
RBC: 4.67 10*6/uL (ref 3.80–5.10)
RDW: 12.9 % (ref 11.0–15.0)
Total Lymphocyte: 18.4 %
WBC: 9.2 10*3/uL (ref 3.8–10.8)

## 2020-09-17 LAB — NOVEL CORONAVIRUS, NAA: SARS-CoV-2, NAA: NOT DETECTED

## 2020-09-17 LAB — TSH: TSH: 0.49 mIU/L (ref 0.40–4.50)

## 2020-09-18 ENCOUNTER — Other Ambulatory Visit: Payer: Self-pay | Admitting: Family Medicine

## 2020-09-18 DIAGNOSIS — R059 Cough, unspecified: Secondary | ICD-10-CM

## 2020-09-18 DIAGNOSIS — J4521 Mild intermittent asthma with (acute) exacerbation: Secondary | ICD-10-CM

## 2020-09-28 ENCOUNTER — Other Ambulatory Visit: Payer: Self-pay

## 2020-09-28 DIAGNOSIS — R059 Cough, unspecified: Secondary | ICD-10-CM

## 2020-09-28 DIAGNOSIS — R7303 Prediabetes: Secondary | ICD-10-CM

## 2020-09-28 DIAGNOSIS — J4521 Mild intermittent asthma with (acute) exacerbation: Secondary | ICD-10-CM

## 2020-09-28 MED ORDER — METFORMIN HCL ER 500 MG PO TB24: 500.0000 mg | ORAL_TABLET | Freq: Two times a day (BID) | ORAL | 3 refills | Status: DC

## 2020-09-28 MED ORDER — MONTELUKAST SODIUM 10 MG PO TABS: 10.0000 mg | ORAL_TABLET | Freq: Every day | ORAL | 0 refills | Status: DC

## 2020-10-21 ENCOUNTER — Telehealth (INDEPENDENT_AMBULATORY_CARE_PROVIDER_SITE_OTHER): Payer: Medicare PPO | Admitting: Family Medicine

## 2020-10-21 ENCOUNTER — Encounter: Payer: Self-pay | Admitting: Family Medicine

## 2020-10-21 VITALS — Ht 64.0 in | Wt 192.0 lb

## 2020-10-21 DIAGNOSIS — M797 Fibromyalgia: Secondary | ICD-10-CM

## 2020-10-21 DIAGNOSIS — G629 Polyneuropathy, unspecified: Secondary | ICD-10-CM | POA: Diagnosis not present

## 2020-10-21 DIAGNOSIS — M549 Dorsalgia, unspecified: Secondary | ICD-10-CM | POA: Diagnosis not present

## 2020-10-21 DIAGNOSIS — K219 Gastro-esophageal reflux disease without esophagitis: Secondary | ICD-10-CM

## 2020-10-21 DIAGNOSIS — M542 Cervicalgia: Secondary | ICD-10-CM | POA: Diagnosis not present

## 2020-10-21 DIAGNOSIS — G8929 Other chronic pain: Secondary | ICD-10-CM

## 2020-10-21 NOTE — Progress Notes (Signed)
Name: Brittany Hill   MRN: [id number]    DOB: 1950-12-01   Date:10/21/2020       Progress Note  Subjective:    Chief Complaint  Chief Complaint  Patient presents with  . Follow-up    I connected with  [name] on 10/21/20 at 10:40 AM EDT by telephone and verified that I am speaking with the correct person using two identifiers.   I discussed the limitations, risks, security and privacy concerns of performing an evaluation and management service by telephone and the availability of in person appointments. Staff also discussed with the patient that there may be a patient responsible charge related to this service.  Patient verbalized understanding and agreed to proceed with encounter. Patient Location: home Provider Location: [facility] Additional Individuals present: none  HPI  Brittany Hill is a 70 y.o. female, presents to clinic with CC of the following:  F/up on worsening pain management after recent visit and increased med dosing:  Last A&P for management reviewed Pharmacy and prescriptions reviewed today with pt as well 12. Chronic back pain, unspecified back location, unspecified back pain laterality Med doses increase as able, she is following with her spine specialist We will do close follow-up in 6 weeks to see if medication adjustments and dose increases have been helpful - pregabalin (LYRICA) 100 MG capsule; Take 100 mg po qam, 100 mg po q lunch, and 200 mg po at bedtime Dispense: 360 capsule; Refill: 2 - tiZANidine (ZANAFLEX) 4 MG tablet; Take 1-1.5 tablets (4-6 mg total) by mouth every 8 (eight) hours as needed for muscle spasms. Dispense: 270 tablet; Refill: 1 - DULoxetine (CYMBALTA) 20 MG capsule; Take 2 tabs (40 mg) po qam and 1 tab (20 mg) po qpm aily Dispense: 270 capsule; Refill: 1  13. Neuropathy Same as above - pregabalin (LYRICA) 100 MG capsule; Take 100 mg po qam, 100 mg po q lunch, and 200 mg po at bedtime Dispense: 360 capsule;  Refill: 2 - DULoxetine (CYMBALTA) 20 MG capsule; Take 2 tabs (40 mg) po qam and 1 tab (20 mg) po qpm aily Dispense: 270 capsule; Refill: 1  Worse pain - fibromyalgia, acute on chronic neck/back pain and neuropathy to arms/hands Dose increase on cymbalta, lyrica and recheck tsh She is seeing spine specialists for f/up and repeated eval/imaging  GERD was told to take PPI daily taking protonix 40 mg daily and then using    Pt did not get meds refilled and could not increase doses She did f/up with specialists - saw PA and started PT, she got imaging done and when she followed up with thespinal surgeon and she did not have proper fusion and needs another surgery - PT was stopped, she is still in pain, as of right now surgery has not been scheduled    Patient Active Problem List   Diagnosis Date Noted  . Type 2 diabetes mellitus without complication, without long-term current use of insulin (HCC) 03/22/2020  . Acquired trigger finger 01/06/2020  . Bouchard's nodes (with arthropathy) 01/06/2020  . Chronic post-operative pain 09/05/2019  . Neck pain 09/05/2019  . Cervical radiculopathy 09/05/2019  . Displacement of cervical intervertebral disc 05/14/2019  . Fatty liver disease, nonalcoholic 03/28/2019  . Osteopenia 10/20/2018  . Obesity (BMI 30.0-34.9) 09/27/2018  . Cataract, left 04/02/2018  . Osteoarthritis of left ankle 02/16/2018  . Asthma without status asthmaticus 10/05/2017  . Genetic testing 08/28/2017  . Family hx-breast malignancy 08/02/2017  . Dense breast tissue  on mammogram 07/28/2016  . Left sided sciatica 12/19/2015  . Left lumbar radiculopathy 12/16/2015  . Hypomagnesemia 12/06/2015  . Hypokalemia 12/02/2015  . Fibromyalgia   . MS (multiple sclerosis) (HCC)   . Chronic constipation   . OA (osteoarthritis)   . Hypothyroidism   . IFG (impaired fasting glucose)   . Hypertension   . Hyperlipidemia   . GERD (gastroesophageal reflux disease)   . MDD (major  depressive disorder), recurrent, in full remission (HCC)   . Anxiety   . TMJ syndrome     Social History   Tobacco Use  . Smoking status: Never Smoker  . Smokeless tobacco: Never Used  Substance Use Topics  . Alcohol use: No    Alcohol/week: 0.0 standard drinks     Current Outpatient Medications:  .  acetaminophen (TYLENOL) 500 MG tablet, Take 1,000 mg by mouth every 6 (six) hours as needed., Disp: , Rfl:  .  calcium carbonate (OS-CAL) 600 MG TABS tablet, Take 600 mg by mouth as needed. , Disp: , Rfl:  .  cholecalciferol (VITAMIN D3) 25 MCG (1000 UNIT) tablet, Take 1,000 Units by mouth daily., Disp: , Rfl:  .  DULoxetine (CYMBALTA) 20 MG capsule, Take 2 tabs (40 mg) po qam and 1 tab (20 mg) po qpm aily, Disp: 270 capsule, Rfl: 1 .  EPINEPHrine 0.3 mg/0.3 mL IJ SOAJ injection, Inject 0.3 mg into the muscle once. , Disp: , Rfl:  .  ezetimibe-simvastatin (VYTORIN) 10-40 MG tablet, Take 1 tablet by mouth at bedtime., Disp: 90 tablet, Rfl: 3 .  famotidine (PEPCID) 20 MG tablet, Take 1 tablet (20 mg total) by mouth 2 (two) times daily as needed for heartburn or indigestion., Disp: 60 tablet, Rfl: 5 .  levothyroxine (SYNTHROID) 88 MCG tablet, Take 1 tablet (88 mcg total) by mouth daily before breakfast., Disp: 90 tablet, Rfl: 3 .  meloxicam (MOBIC) 15 MG tablet, Take 1 tablet (15 mg total) by mouth daily., Disp: 90 tablet, Rfl: 1 .  metFORMIN (GLUCOPHAGE-XR) 500 MG 24 hr tablet, Take 1 tablet (500 mg total) by mouth 2 (two) times daily., Disp: 180 tablet, Rfl: 3 .  metoprolol succinate (TOPROL-XL) 50 MG 24 hr tablet, Take 1 tablet (50 mg total) by mouth at bedtime. Take with or immediately following a meal., Disp: 90 tablet, Rfl: 3 .  montelukast (SINGULAIR) 10 MG tablet, Take 1 tablet (10 mg total) by mouth at bedtime. (Patient taking differently: Take 10 mg by mouth as needed.), Disp: 90 tablet, Rfl: 0 .  ondansetron (ZOFRAN ODT) 4 MG disintegrating tablet, Take 1 tablet (4 mg total) by mouth  every 8 (eight) hours as needed for nausea or vomiting., Disp: 20 tablet, Rfl: 1 .  pantoprazole (PROTONIX) 40 MG tablet, Take 1 tablet (40 mg total) by mouth at bedtime., Disp: 90 tablet, Rfl: 3 .  pregabalin (LYRICA) 100 MG capsule, Take 100 mg po qam, 100 mg po q lunch, and 200 mg po at bedtime, Disp: 360 capsule, Rfl: 2 .  spironolactone (ALDACTONE) 50 MG tablet, Take 50 mg by mouth daily. With breakfast, Disp: , Rfl:  .  tiZANidine (ZANAFLEX) 4 MG tablet, Take 1-1.5 tablets (4-6 mg total) by mouth every 8 (eight) hours as needed for muscle spasms., Disp: 270 tablet, Rfl: 1  Allergies  Allergen Reactions  . Shellfish Allergy Rash and Swelling  . Codeine Nausea And Vomiting  . Sulfa Antibiotics Swelling     Chart Review Today: I personally reviewed active problem list, medication list,  allergies, family history, social history, health maintenance, notes from last encounter, lab results, imaging with the patient/caregiver today.   Review of Systems All other systems reviewed and are negative for acute change except as noted in the HPI.  Objective:    Virtual encounter, vitals limited, only able to obtain the following Today's Vitals   10/21/20 1013  Weight: 192 lb (87.1 kg)  Height: 5\' 4"  (1.626 m)   Body mass index is 32.96 kg/m. Nursing Note and Vital Signs reviewed.  Physical Exam Vitals and nursing note reviewed.  Neurological:     Mental Status: She is alert.  Psychiatric:        Mood and Affect: Mood normal.        Behavior: Behavior normal. PE limited by telephone encounter  No results found for this or any previous visit (from the past 72 hour(s)).  Assessment and Plan:     ICD-10-CM   1. Fibromyalgia  M79.7    pt did not get meds to increase cymbalta or lyrica dosing  2. Chronic back pain, unspecified back location, unspecified back pain laterality  M54.9    G89.29    see above  3. Neuropathy  G62.9    see above  4. Neck pain  M54.2     per specialists - needs another surgery - we will still help with managing her overall chronic pain - asked pt to clarify with surgeon re postop pain mgmt  5. Gastroesophageal reflux disease, unspecified whether esophagitis present  K21.9    sx well controlled with protonix 40 mg daily, some days needs pepcid prn   Staff to help f/up with pt's pharmacy to ensure they will give her Rx with new dosing for cymbalta and lyrica - and f/up with pt  Pt will have another spine surgery - not yet scheduled, encouraged pt to clarify with the surgeon how long he will manage her post-op pain and recovery.  She has a complicated case which is rare, after last surgery she had no management from surgery after 1 week - I have asked her to see if he will follow her for a little longer in light of her multiple complications.  I am happy to continue lyrica and cymbalta and even narcotics after 6 weeks - but given her degree of complications I think she needs access to him for assessment of normal postop pain and recovery    Pt should have f/up for routine conditions in about 3 months   - I discussed the assessment and treatment plan with the patient. The patient was provided an opportunity to ask questions and all were answered. The patient agreed with the plan and demonstrated an understanding of the instructions.  - The patient was advised to call back or seek an in-person evaluation if the symptoms worsen or if the condition fails to improve as anticipated.  I provided 20+ minutes of non-face-to-face time during this encounter.  , PA-C 10/21/20 11:10 AM

## 2020-10-21 NOTE — Progress Notes (Deleted)
Patient ID: Brittany Hill, female    DOB: [birth year], 70 y.o.   MRN: [id number]  PCP: Danelle Berry, PA-C  Chief Complaint  Patient presents with  . Follow-up    Subjective:   Brittany Hill is a 70 y.o. female, presents to clinic with CC of the following:  F/up on worsening pain management:  12. Chronic back pain, unspecified back location, unspecified back pain laterality Med doses increase as able, she is following with her spine specialist We will do close follow-up in 6 weeks to see if medication adjustments and dose increases have been helpful - pregabalin (LYRICA) 100 MG capsule; Take 100 mg po qam, 100 mg po q lunch, and 200 mg po at bedtime  Dispense: 360 capsule; Refill: 2 - tiZANidine (ZANAFLEX) 4 MG tablet; Take 1-1.5 tablets (4-6 mg total) by mouth every 8 (eight) hours as needed for muscle spasms.  Dispense: 270 tablet; Refill: 1 - DULoxetine (CYMBALTA) 20 MG capsule; Take 2 tabs (40 mg) po qam and 1 tab (20 mg) po qpm aily  Dispense: 270 capsule; Refill: 1  13. Neuropathy Same as above - pregabalin (LYRICA) 100 MG capsule; Take 100 mg po qam, 100 mg po q lunch, and 200 mg po at bedtime  Dispense: 360 capsule; Refill: 2 - DULoxetine (CYMBALTA) 20 MG capsule; Take 2 tabs (40 mg) po qam and 1 tab (20 mg) po qpm aily  Dispense: 270 capsule; Refill: 1  Worse pain - fibromyalgia, acute on chronic neck/back pain and neuropathy to arms/hands Dose increase on cymbalta, lyrica and recheck tsh She is seeing spine specialists for f/up and repeated eval/imaging  GERD was told to take PPI daily taking protonix 40 mg daily and then using    Pt did not get meds refilled and could not increase doses She got imaging done with spinal surgeon and she did not have proper fusion and needs another surgery - PT was stopped, she is still in pain, as of right now surgery has not been scheduled   Patient Active Problem List   Diagnosis Date Noted  . Type 2 diabetes mellitus  without complication, without long-term current use of insulin (HCC) 03/22/2020  . Acquired trigger finger 01/06/2020  . Bouchard's nodes (with arthropathy) 01/06/2020  . Chronic post-operative pain 09/05/2019  . Neck pain 09/05/2019  . Cervical radiculopathy 09/05/2019  . Displacement of cervical intervertebral disc 05/14/2019  . Fatty liver disease, nonalcoholic 03/28/2019  . Osteopenia 10/20/2018  . Obesity (BMI 30.0-34.9) 09/27/2018  . Cataract, left 04/02/2018  . Osteoarthritis of left ankle 02/16/2018  . Asthma without status asthmaticus 10/05/2017  . Genetic testing 08/28/2017  . Family hx-breast malignancy 08/02/2017  . Dense breast tissue on mammogram 07/28/2016  . Left sided sciatica 12/19/2015  . Left lumbar radiculopathy 12/16/2015  . Hypomagnesemia 12/06/2015  . Hypokalemia 12/02/2015  . Fibromyalgia   . MS (multiple sclerosis) (HCC)   . Chronic constipation   . OA (osteoarthritis)   . Hypothyroidism   . IFG (impaired fasting glucose)   . Hypertension   . Hyperlipidemia   . GERD (gastroesophageal reflux disease)   . MDD (major depressive disorder), recurrent, in full remission (HCC)   . Anxiety   . TMJ syndrome       Current Outpatient Medications:  .  acetaminophen (TYLENOL) 500 MG tablet, Take 1,000 mg by mouth every 6 (six) hours as needed., Disp: , Rfl:  .  calcium carbonate (OS-CAL) 600 MG TABS tablet, Take 600 mg by  mouth as needed. , Disp: , Rfl:  .  cholecalciferol (VITAMIN D3) 25 MCG (1000 UNIT) tablet, Take 1,000 Units by mouth daily., Disp: , Rfl:  .  DULoxetine (CYMBALTA) 20 MG capsule, Take 2 tabs (40 mg) po qam and 1 tab (20 mg) po qpm aily, Disp: 270 capsule, Rfl: 1 .  EPINEPHrine 0.3 mg/0.3 mL IJ SOAJ injection, Inject 0.3 mg into the muscle once. , Disp: , Rfl:  .  ezetimibe-simvastatin (VYTORIN) 10-40 MG tablet, Take 1 tablet by mouth at bedtime., Disp: 90 tablet, Rfl: 3 .  famotidine (PEPCID) 20 MG tablet, Take 1 tablet (20 mg total) by mouth  2 (two) times daily as needed for heartburn or indigestion., Disp: 60 tablet, Rfl: 5 .  levothyroxine (SYNTHROID) 88 MCG tablet, Take 1 tablet (88 mcg total) by mouth daily before breakfast., Disp: 90 tablet, Rfl: 3 .  meloxicam (MOBIC) 15 MG tablet, Take 1 tablet (15 mg total) by mouth daily., Disp: 90 tablet, Rfl: 1 .  metFORMIN (GLUCOPHAGE-XR) 500 MG 24 hr tablet, Take 1 tablet (500 mg total) by mouth 2 (two) times daily., Disp: 180 tablet, Rfl: 3 .  metoprolol succinate (TOPROL-XL) 50 MG 24 hr tablet, Take 1 tablet (50 mg total) by mouth at bedtime. Take with or immediately following a meal., Disp: 90 tablet, Rfl: 3 .  montelukast (SINGULAIR) 10 MG tablet, Take 1 tablet (10 mg total) by mouth at bedtime. (Patient taking differently: Take 10 mg by mouth as needed.), Disp: 90 tablet, Rfl: 0 .  ondansetron (ZOFRAN ODT) 4 MG disintegrating tablet, Take 1 tablet (4 mg total) by mouth every 8 (eight) hours as needed for nausea or vomiting., Disp: 20 tablet, Rfl: 1 .  pantoprazole (PROTONIX) 40 MG tablet, Take 1 tablet (40 mg total) by mouth at bedtime., Disp: 90 tablet, Rfl: 3 .  pregabalin (LYRICA) 100 MG capsule, Take 100 mg po qam, 100 mg po q lunch, and 200 mg po at bedtime, Disp: 360 capsule, Rfl: 2 .  spironolactone (ALDACTONE) 50 MG tablet, Take 50 mg by mouth daily. With breakfast, Disp: , Rfl:  .  tiZANidine (ZANAFLEX) 4 MG tablet, Take 1-1.5 tablets (4-6 mg total) by mouth every 8 (eight) hours as needed for muscle spasms., Disp: 270 tablet, Rfl: 1   Allergies  Allergen Reactions  . Shellfish Allergy Rash and Swelling  . Codeine Nausea And Vomiting  . Sulfa Antibiotics Swelling     Social History   Tobacco Use  . Smoking status: Never Smoker  . Smokeless tobacco: Never Used  Vaping Use  . Vaping Use: Never used  Substance Use Topics  . Alcohol use: No    Alcohol/week: 0.0 standard drinks  . Drug use: No      Chart Review Today: I personally reviewed active problem list,  medication list, allergies, family history, social history, health maintenance, notes from last encounter, lab results, imaging with the patient/caregiver today.   Review of Systems All other systems reviewed and are negative for acute change except as noted in the HPI.     Objective:   Vitals:   10/21/20 1013  Weight: 192 lb (87.1 kg)  Height: 5\' 4"  (1.626 m)    Body mass index is 32.96 kg/m.  Physical Exam Vitals and nursing note reviewed.  Neurological:     Mental Status: She is alert.  Psychiatric:        Mood and Affect: Mood normal.        Behavior: Behavior normal.  Results for orders placed or performed in visit on 09/16/20  Novel Coronavirus, NAA ([facility])   Specimen: Nasopharyngeal(NP) swabs in vial transport medium   Nasopharynge  Result Value Ref Range   SARS-CoV-2, NAA Not Detected Not Detected  SARS-COV-2, NAA 2 DAY TAT   Nasopharynge  Result Value Ref Range   SARS-CoV-2, NAA 2 DAY TAT Performed   Hemoglobin A1C  Result Value Ref Range   Hgb A1c MFr Bld 6.0 (H) <5.7 % of total Hgb   Mean Plasma Glucose 126 mg/dL   eAG (mmol/L) 7.0 mmol/L  Lipid panel  Result Value Ref Range   Cholesterol 156 <200 mg/dL   HDL 49 (L) > OR = 50 mg/dL   Triglycerides 854 (H) <150 mg/dL   LDL Cholesterol (Calc) 79 mg/dL (calc)   Total CHOL/HDL Ratio 3.2 <5.0 (calc)   Non-HDL Cholesterol (Calc) 107 <130 mg/dL (calc)  COMPLETE METABOLIC PANEL WITH GFR  Result Value Ref Range   Glucose, Bld 96 65 - 99 mg/dL   BUN 9 7 - 25 mg/dL   Creat 6.27 0.35 - 0.09 mg/dL   GFR, Est Non African American 69 > OR = 60 mL/min/1.69m2   GFR, Est African American 80 > OR = 60 mL/min/1.80m2   BUN/Creatinine Ratio NOT APPLICABLE 6 - 22 (calc)   Sodium 140 135 - 146 mmol/L   Potassium 4.5 3.5 - 5.3 mmol/L   Chloride 102 98 - 110 mmol/L   CO2 29 20 - 32 mmol/L   Calcium 10.5 (H) 8.6 - 10.4 mg/dL   Total Protein 7.5 6.1 - 8.1 g/dL   Albumin 4.8 3.6 - 5.1 g/dL   Globulin 2.7 1.9 - 3.7  g/dL (calc)   AG Ratio 1.8 1.0 - 2.5 (calc)   Total Bilirubin 0.6 0.2 - 1.2 mg/dL   Alkaline phosphatase (APISO) 53 37 - 153 U/L   AST 22 10 - 35 U/L   ALT 17 6 - 29 U/L  TSH  Result Value Ref Range   TSH 0.49 0.40 - 4.50 mIU/L  CBC with Differential/Platelet  Result Value Ref Range   WBC 9.2 3.8 - 10.8 Thousand/uL   RBC 4.67 3.80 - 5.10 Million/uL   Hemoglobin 13.6 11.7 - 15.5 g/dL   HCT 38.1 82.9 - 93.7 %   MCV 86.7 80.0 - 100.0 fL   MCH 29.1 27.0 - 33.0 pg   MCHC 33.6 32.0 - 36.0 g/dL   RDW 16.9 67.8 - 93.8 %   Platelets 227 140 - 400 Thousand/uL   MPV 10.4 7.5 - 12.5 fL   Neutro Abs 6,587 1,500 - 7,800 cells/uL   Lymphs Abs 1,693 850 - 3,900 cells/uL   Absolute Monocytes 653 200 - 950 cells/uL   Eosinophils Absolute 239 15 - 500 cells/uL   Basophils Absolute 28 0 - 200 cells/uL   Neutrophils Relative % 71.6 %   Total Lymphocyte 18.4 %   Monocytes Relative 7.1 %   Eosinophils Relative 2.6 %   Basophils Relative 0.3 %       Assessment & Plan:     ICD-10-CM   1. Fibromyalgia  M79.7    pt did not get meds to increase cymbalta or lyrica dosing  2. Chronic back pain, unspecified back location, unspecified back pain laterality  M54.9    G89.29    see above  3. Neuropathy  G62.9    see above  4. Neck pain  M54.2    per specialists - needs another surgery - we  will still help with managing her overall chronic pain - asked pt to clarify with surgeon re postop pain mgmt  5. Gastroesophageal reflux disease, unspecified whether esophagitis present  K21.9    sx well controlled with protonix 40 mg daily, some days needs pepcid prn   Staff to help f/up with pt's pharmacy to ensure they will give her Rx with new dosing for cymbalta and lyrica - and f/up with pt  Pt will have another spine surgery - not yet scheduled, encouraged pt to clarify with the surgeon how long he will manage her post-op pain and recovery.  She has a complicated case which is rare, after last surgery she  had no management from surgery after 1 week - I have asked her to see if he will follow her for a little longer in light of her multiple complications.  I am happy to continue lyrica and cymbalta and even narcotics after 6 weeks - but given her degree of complications I think she needs access to him for assessment of normal postop pain and recovery    Pt should have f/up for routine conditions in about 3 months   Danelle Berry, PA-C 10/21/20 10:51 AM

## 2020-10-28 ENCOUNTER — Telehealth: Payer: Medicare PPO | Admitting: Family Medicine

## 2020-11-04 ENCOUNTER — Other Ambulatory Visit: Payer: Self-pay

## 2020-11-04 ENCOUNTER — Ambulatory Visit
Admission: RE | Admit: 2020-11-04 | Discharge: 2020-11-04 | Disposition: A | Discharge status: Home / Self Care | Payer: Medicare PPO | Source: Ambulatory Visit | Attending: Family Medicine | Admitting: Family Medicine

## 2020-11-04 DIAGNOSIS — Z1231 Encounter for screening mammogram for malignant neoplasm of breast: Secondary | ICD-10-CM

## 2020-11-04 DIAGNOSIS — Z780 Asymptomatic menopausal state: Secondary | ICD-10-CM | POA: Insufficient documentation

## 2020-11-15 ENCOUNTER — Telehealth: Payer: Medicare PPO | Admitting: Physician Assistant

## 2020-11-15 DIAGNOSIS — J0140 Acute pansinusitis, unspecified: Secondary | ICD-10-CM | POA: Diagnosis not present

## 2020-11-15 MED ORDER — AMOXICILLIN-POT CLAVULANATE 875-125 MG PO TABS: 1.0000 | ORAL_TABLET | Freq: Two times a day (BID) | ORAL | 0 refills | Status: DC

## 2020-11-15 NOTE — Progress Notes (Signed)
We are sorry that you are not feeling well.  Here is how we plan to help!  Based on what you have shared with me it looks like you have sinusitis.  Sinusitis is inflammation and infection in the sinus cavities of the head.  Based on your presentation I believe you most likely have Acute Bacterial Sinusitis.  This is an infection caused by bacteria and is treated with antibiotics. I have prescribed Augmentin 875mg/125mg one tablet twice daily with food, for 7 days. You may use an oral decongestant such as Mucinex D or if you have glaucoma or high blood pressure use plain Mucinex. Saline nasal spray help and can safely be used as often as needed for congestion.  If you develop worsening sinus pain, fever or notice severe headache and vision changes, or if symptoms are not better after completion of antibiotic, please schedule an appointment with a health care provider.    Sinus infections are not as easily transmitted as other respiratory infection, however we still recommend that you avoid close contact with loved ones, especially the very young and elderly.  Remember to wash your hands thoroughly throughout the day as this is the number one way to prevent the spread of infection!  Home Care:  Only take medications as instructed by your medical team.  Complete the entire course of an antibiotic.  Do not take these medications with alcohol.  A steam or ultrasonic humidifier can help congestion.  You can place a towel over your head and breathe in the steam from hot water coming from a faucet.  Avoid close contacts especially the very young and the elderly.  Cover your mouth when you cough or sneeze.  Always remember to wash your hands.  Get Help Right Away If:  You develop worsening fever or sinus pain.  You develop a severe head ache or visual changes.  Your symptoms persist after you have completed your treatment plan.  Make sure you  Understand these instructions.  Will watch your  condition.  Will get help right away if you are not doing well or get worse.  Your e-visit answers were reviewed by a board certified advanced clinical practitioner to complete your personal care plan.  Depending on the condition, your plan could have included both over the counter or prescription medications.  If there is a problem please reply  once you have received a response from your provider.  Your safety is important to us.  If you have drug allergies check your prescription carefully.    You can use MyChart to ask questions about today's visit, request a non-urgent call back, or ask for a work or school excuse for 24 hours related to this e-Visit. If it has been greater than 24 hours you will need to follow up with your provider, or enter a new e-Visit to address those concerns.  You will get an e-mail in the next two days asking about your experience.  I hope that your e-visit has been valuable and will speed your recovery. Thank you for using e-visits.  I provided 5 minutes of non face-to-face time during this encounter for chart review and documentation.   

## 2020-12-13 ENCOUNTER — Other Ambulatory Visit: Payer: Self-pay | Admitting: Family Medicine

## 2020-12-13 DIAGNOSIS — M542 Cervicalgia: Secondary | ICD-10-CM

## 2020-12-31 LAB — HM DIABETES EYE EXAM

## 2021-01-20 ENCOUNTER — Ambulatory Visit: Payer: Medicare PPO | Admitting: Family Medicine

## 2021-01-28 ENCOUNTER — Ambulatory Visit: Payer: Medicare PPO | Admitting: Family Medicine

## 2021-02-01 ENCOUNTER — Other Ambulatory Visit: Payer: Self-pay

## 2021-02-01 ENCOUNTER — Encounter: Payer: Self-pay | Admitting: Family Medicine

## 2021-02-01 ENCOUNTER — Ambulatory Visit: Payer: Medicare PPO | Admitting: Family Medicine

## 2021-02-01 VITALS — BP 128/84 | HR 78 | Temp 97.9°F | Resp 16 | Ht 64.0 in | Wt 195.4 lb

## 2021-02-01 DIAGNOSIS — E782 Mixed hyperlipidemia: Secondary | ICD-10-CM

## 2021-02-01 DIAGNOSIS — I10 Essential (primary) hypertension: Secondary | ICD-10-CM | POA: Diagnosis not present

## 2021-02-01 DIAGNOSIS — M5412 Radiculopathy, cervical region: Secondary | ICD-10-CM | POA: Diagnosis not present

## 2021-02-01 DIAGNOSIS — E119 Type 2 diabetes mellitus without complications: Secondary | ICD-10-CM

## 2021-02-01 DIAGNOSIS — R7301 Impaired fasting glucose: Secondary | ICD-10-CM | POA: Diagnosis not present

## 2021-02-01 NOTE — Assessment & Plan Note (Signed)
>>  ASSESSMENT AND PLAN FOR PREDIABETES WRITTEN ON 02/01/2021 12:59 PM BY RUMBALL, DONALD HERO, DO  Doing well. Will plan to recheck a1c at next visit (q6 mnths).

## 2021-02-01 NOTE — Assessment & Plan Note (Addendum)
Pain better controlled since dose increase, no changes made today. Plan to f/u after surgery in August with hopeful decrease in need for pain control at that time.

## 2021-02-01 NOTE — Progress Notes (Signed)
    SUBJECTIVE:   CHIEF COMPLAINT / HPI:   Chronic back pain with neuropathy - s/p cervical spinal fusion 05/2019 though didn't heal properly. Arthrodesis scheduled 04/06/21. - going to pain clinic at [facility] 7/8. - recent dose increases 10/2020. On lyrica, zanaflex, mobic, cymbalta. Pain much better.  Hypertension: - Medications: metoprolol 50mg  daily, spironolactone 50mg  - Compliance: good - Checking BP at home: only if feels is high, will be in 140s. - Denies any SOB, vision changes, LE edema, medication SEs, or symptoms of hypotension  Diabetes, Type 2 - Last A1c 6.0 09/2020 - Medications: metformin 500mg   - Compliance: good - Checking BG at home: no - Eye exam: UTD - Foot exam: UTD - Microalbumin: UTD - Statin: yes - Denies symptoms of hypoglycemia, polyuria, polydipsia, numbness extremities, foot ulcers/trauma  HLD - medications: vytorin 10-40mg  - compliance: good - medication SEs: none   OBJECTIVE:   BP 128/84   Pulse 78   Temp 97.9 F (36.6 C)   Resp 16   Ht 5\' 4"  (1.626 m)   Wt 195 lb 6.4 oz (88.6 kg)   SpO2 97%   BMI 33.54 kg/m   Gen: well appearing, in NAD Card: RRR, no LE edema Lungs: CTAB   ASSESSMENT/PLAN:   Hypertension Well controlled on current regimen, no changes made.  Type 2 diabetes mellitus without complication, without long-term current use of insulin (HCC) Doing well. Will plan to recheck a1c at next visit (q6 mnths).  Cervical radiculopathy Pain better controlled since dose increase, no changes made today. Plan to f/u after surgery in August with hopeful decrease in need for pain control at that time.  Hyperlipidemia Doing well on current regimen, no changes made today.     10/2020, DO

## 2021-02-01 NOTE — Assessment & Plan Note (Signed)
Doing well on current regimen, no changes made today. 

## 2021-02-01 NOTE — Patient Instructions (Signed)
It was great to see you!  Our plans for today:  - No changes to your medicaitons todAY. - We will see you back in a few months.  Take care and seek immediate care sooner if you develop any concerns.   Dr. Linwood Dibbles

## 2021-02-01 NOTE — Assessment & Plan Note (Signed)
Doing well. Will plan to recheck a1c at next visit (q6 mnths).

## 2021-02-01 NOTE — Assessment & Plan Note (Signed)
Well controlled on current regimen, no changes made.

## 2021-02-08 DIAGNOSIS — H04553 Acquired stenosis of bilateral nasolacrimal duct: Secondary | ICD-10-CM | POA: Insufficient documentation

## 2021-02-21 ENCOUNTER — Other Ambulatory Visit: Payer: Self-pay | Admitting: Family Medicine

## 2021-02-21 DIAGNOSIS — R059 Cough, unspecified: Secondary | ICD-10-CM

## 2021-02-21 DIAGNOSIS — J4521 Mild intermittent asthma with (acute) exacerbation: Secondary | ICD-10-CM

## 2021-03-10 DIAGNOSIS — G8929 Other chronic pain: Secondary | ICD-10-CM | POA: Diagnosis not present

## 2021-03-10 DIAGNOSIS — F4322 Adjustment disorder with anxiety: Secondary | ICD-10-CM | POA: Diagnosis not present

## 2021-03-11 DIAGNOSIS — M810 Age-related osteoporosis without current pathological fracture: Secondary | ICD-10-CM | POA: Diagnosis not present

## 2021-03-21 ENCOUNTER — Other Ambulatory Visit: Payer: Self-pay | Admitting: Family Medicine

## 2021-03-21 DIAGNOSIS — E039 Hypothyroidism, unspecified: Secondary | ICD-10-CM

## 2021-03-23 DIAGNOSIS — Z01818 Encounter for other preprocedural examination: Secondary | ICD-10-CM | POA: Diagnosis not present

## 2021-03-23 DIAGNOSIS — M5412 Radiculopathy, cervical region: Secondary | ICD-10-CM | POA: Diagnosis not present

## 2021-03-23 DIAGNOSIS — E039 Hypothyroidism, unspecified: Secondary | ICD-10-CM | POA: Diagnosis not present

## 2021-03-23 DIAGNOSIS — K760 Fatty (change of) liver, not elsewhere classified: Secondary | ICD-10-CM | POA: Diagnosis not present

## 2021-03-23 DIAGNOSIS — R7301 Impaired fasting glucose: Secondary | ICD-10-CM | POA: Diagnosis not present

## 2021-03-23 DIAGNOSIS — K219 Gastro-esophageal reflux disease without esophagitis: Secondary | ICD-10-CM | POA: Diagnosis not present

## 2021-03-23 DIAGNOSIS — J45909 Unspecified asthma, uncomplicated: Secondary | ICD-10-CM | POA: Diagnosis not present

## 2021-03-23 DIAGNOSIS — M542 Cervicalgia: Secondary | ICD-10-CM | POA: Diagnosis not present

## 2021-03-23 DIAGNOSIS — I10 Essential (primary) hypertension: Secondary | ICD-10-CM | POA: Diagnosis not present

## 2021-03-23 DIAGNOSIS — G35 Multiple sclerosis: Secondary | ICD-10-CM | POA: Diagnosis not present

## 2021-03-23 DIAGNOSIS — E079 Disorder of thyroid, unspecified: Secondary | ICD-10-CM | POA: Diagnosis not present

## 2021-03-23 DIAGNOSIS — R7303 Prediabetes: Secondary | ICD-10-CM | POA: Diagnosis not present

## 2021-03-29 ENCOUNTER — Other Ambulatory Visit: Payer: Self-pay | Admitting: Family Medicine

## 2021-03-29 DIAGNOSIS — G8929 Other chronic pain: Secondary | ICD-10-CM

## 2021-03-29 DIAGNOSIS — G629 Polyneuropathy, unspecified: Secondary | ICD-10-CM

## 2021-03-29 DIAGNOSIS — M797 Fibromyalgia: Secondary | ICD-10-CM

## 2021-03-30 NOTE — Telephone Encounter (Signed)
Please call pt and ask how she is taking this med - its very high doses but not very regular refills and there is nothing on the controlled substance database - I don't want to send in too high of a dose if she not taking it like its currently written  Thanks please let me know

## 2021-03-31 DIAGNOSIS — R079 Chest pain, unspecified: Secondary | ICD-10-CM | POA: Diagnosis not present

## 2021-03-31 DIAGNOSIS — I10 Essential (primary) hypertension: Secondary | ICD-10-CM | POA: Diagnosis not present

## 2021-03-31 DIAGNOSIS — R0602 Shortness of breath: Secondary | ICD-10-CM | POA: Diagnosis not present

## 2021-03-31 NOTE — Telephone Encounter (Signed)
She states is on 100mg  in am and 200mg  at bedtime

## 2021-04-04 DIAGNOSIS — M549 Dorsalgia, unspecified: Secondary | ICD-10-CM | POA: Diagnosis not present

## 2021-04-04 DIAGNOSIS — F3342 Major depressive disorder, recurrent, in full remission: Secondary | ICD-10-CM | POA: Diagnosis not present

## 2021-04-04 DIAGNOSIS — T84498A Other mechanical complication of other internal orthopedic devices, implants and grafts, initial encounter: Secondary | ICD-10-CM | POA: Diagnosis not present

## 2021-04-04 DIAGNOSIS — E039 Hypothyroidism, unspecified: Secondary | ICD-10-CM | POA: Diagnosis not present

## 2021-04-04 DIAGNOSIS — M542 Cervicalgia: Secondary | ICD-10-CM | POA: Diagnosis not present

## 2021-04-04 DIAGNOSIS — D62 Acute posthemorrhagic anemia: Secondary | ICD-10-CM | POA: Diagnosis not present

## 2021-04-04 DIAGNOSIS — R9431 Abnormal electrocardiogram [ECG] [EKG]: Secondary | ICD-10-CM | POA: Diagnosis not present

## 2021-04-04 DIAGNOSIS — J45909 Unspecified asthma, uncomplicated: Secondary | ICD-10-CM | POA: Diagnosis not present

## 2021-04-04 DIAGNOSIS — E079 Disorder of thyroid, unspecified: Secondary | ICD-10-CM | POA: Diagnosis not present

## 2021-04-04 DIAGNOSIS — S129XXA Fracture of neck, unspecified, initial encounter: Secondary | ICD-10-CM | POA: Diagnosis not present

## 2021-04-04 DIAGNOSIS — M5412 Radiculopathy, cervical region: Secondary | ICD-10-CM | POA: Diagnosis not present

## 2021-04-04 DIAGNOSIS — E119 Type 2 diabetes mellitus without complications: Secondary | ICD-10-CM | POA: Diagnosis not present

## 2021-04-04 DIAGNOSIS — Z981 Arthrodesis status: Secondary | ICD-10-CM | POA: Diagnosis not present

## 2021-04-04 DIAGNOSIS — M5020 Other cervical disc displacement, unspecified cervical region: Secondary | ICD-10-CM | POA: Diagnosis not present

## 2021-04-04 DIAGNOSIS — K219 Gastro-esophageal reflux disease without esophagitis: Secondary | ICD-10-CM | POA: Diagnosis not present

## 2021-04-04 DIAGNOSIS — Z4682 Encounter for fitting and adjustment of non-vascular catheter: Secondary | ICD-10-CM | POA: Diagnosis not present

## 2021-04-04 DIAGNOSIS — R079 Chest pain, unspecified: Secondary | ICD-10-CM | POA: Diagnosis not present

## 2021-04-04 DIAGNOSIS — M960 Pseudarthrosis after fusion or arthrodesis: Secondary | ICD-10-CM | POA: Diagnosis not present

## 2021-04-04 DIAGNOSIS — I10 Essential (primary) hypertension: Secondary | ICD-10-CM | POA: Diagnosis not present

## 2021-04-04 DIAGNOSIS — J9601 Acute respiratory failure with hypoxia: Secondary | ICD-10-CM | POA: Diagnosis not present

## 2021-04-04 DIAGNOSIS — M47812 Spondylosis without myelopathy or radiculopathy, cervical region: Secondary | ICD-10-CM | POA: Diagnosis not present

## 2021-04-14 DIAGNOSIS — I10 Essential (primary) hypertension: Secondary | ICD-10-CM | POA: Diagnosis not present

## 2021-04-14 DIAGNOSIS — E119 Type 2 diabetes mellitus without complications: Secondary | ICD-10-CM | POA: Diagnosis not present

## 2021-04-14 DIAGNOSIS — E669 Obesity, unspecified: Secondary | ICD-10-CM | POA: Diagnosis not present

## 2021-04-14 DIAGNOSIS — G35 Multiple sclerosis: Secondary | ICD-10-CM | POA: Diagnosis not present

## 2021-04-14 DIAGNOSIS — M152 Bouchard's nodes (with arthropathy): Secondary | ICD-10-CM | POA: Diagnosis not present

## 2021-04-14 DIAGNOSIS — Z4789 Encounter for other orthopedic aftercare: Secondary | ICD-10-CM | POA: Diagnosis not present

## 2021-04-14 DIAGNOSIS — M5023 Other cervical disc displacement, cervicothoracic region: Secondary | ICD-10-CM | POA: Diagnosis not present

## 2021-04-14 DIAGNOSIS — F3342 Major depressive disorder, recurrent, in full remission: Secondary | ICD-10-CM | POA: Diagnosis not present

## 2021-04-14 DIAGNOSIS — J45909 Unspecified asthma, uncomplicated: Secondary | ICD-10-CM | POA: Diagnosis not present

## 2021-04-18 DIAGNOSIS — E669 Obesity, unspecified: Secondary | ICD-10-CM | POA: Diagnosis not present

## 2021-04-18 DIAGNOSIS — E119 Type 2 diabetes mellitus without complications: Secondary | ICD-10-CM | POA: Diagnosis not present

## 2021-04-18 DIAGNOSIS — M152 Bouchard's nodes (with arthropathy): Secondary | ICD-10-CM | POA: Diagnosis not present

## 2021-04-18 DIAGNOSIS — G35 Multiple sclerosis: Secondary | ICD-10-CM | POA: Diagnosis not present

## 2021-04-18 DIAGNOSIS — J45909 Unspecified asthma, uncomplicated: Secondary | ICD-10-CM | POA: Diagnosis not present

## 2021-04-18 DIAGNOSIS — I10 Essential (primary) hypertension: Secondary | ICD-10-CM | POA: Diagnosis not present

## 2021-04-18 DIAGNOSIS — Z4789 Encounter for other orthopedic aftercare: Secondary | ICD-10-CM | POA: Diagnosis not present

## 2021-04-18 DIAGNOSIS — M5023 Other cervical disc displacement, cervicothoracic region: Secondary | ICD-10-CM | POA: Diagnosis not present

## 2021-04-18 DIAGNOSIS — F3342 Major depressive disorder, recurrent, in full remission: Secondary | ICD-10-CM | POA: Diagnosis not present

## 2021-04-20 ENCOUNTER — Other Ambulatory Visit: Payer: Self-pay | Admitting: Family Medicine

## 2021-04-20 DIAGNOSIS — E782 Mixed hyperlipidemia: Secondary | ICD-10-CM

## 2021-04-20 DIAGNOSIS — M5412 Radiculopathy, cervical region: Secondary | ICD-10-CM | POA: Diagnosis not present

## 2021-04-20 DIAGNOSIS — Z79899 Other long term (current) drug therapy: Secondary | ICD-10-CM | POA: Diagnosis not present

## 2021-04-20 DIAGNOSIS — Z5181 Encounter for therapeutic drug level monitoring: Secondary | ICD-10-CM | POA: Diagnosis not present

## 2021-04-20 DIAGNOSIS — M26641 Arthritis of right temporomandibular joint: Secondary | ICD-10-CM | POA: Diagnosis not present

## 2021-04-20 DIAGNOSIS — M960 Pseudarthrosis after fusion or arthrodesis: Secondary | ICD-10-CM | POA: Diagnosis not present

## 2021-04-20 DIAGNOSIS — G8918 Other acute postprocedural pain: Secondary | ICD-10-CM | POA: Diagnosis not present

## 2021-04-28 ENCOUNTER — Other Ambulatory Visit: Payer: Self-pay | Admitting: Family Medicine

## 2021-04-28 DIAGNOSIS — E782 Mixed hyperlipidemia: Secondary | ICD-10-CM

## 2021-05-04 DIAGNOSIS — G8918 Other acute postprocedural pain: Secondary | ICD-10-CM | POA: Diagnosis not present

## 2021-05-04 DIAGNOSIS — M26623 Arthralgia of bilateral temporomandibular joint: Secondary | ICD-10-CM | POA: Diagnosis not present

## 2021-05-05 ENCOUNTER — Other Ambulatory Visit: Payer: Self-pay | Admitting: Family Medicine

## 2021-05-05 DIAGNOSIS — G629 Polyneuropathy, unspecified: Secondary | ICD-10-CM

## 2021-05-05 DIAGNOSIS — F3342 Major depressive disorder, recurrent, in full remission: Secondary | ICD-10-CM

## 2021-05-05 DIAGNOSIS — M797 Fibromyalgia: Secondary | ICD-10-CM

## 2021-05-05 DIAGNOSIS — G8929 Other chronic pain: Secondary | ICD-10-CM

## 2021-05-18 DIAGNOSIS — G8918 Other acute postprocedural pain: Secondary | ICD-10-CM | POA: Diagnosis not present

## 2021-05-18 DIAGNOSIS — Z79891 Long term (current) use of opiate analgesic: Secondary | ICD-10-CM | POA: Diagnosis not present

## 2021-05-18 DIAGNOSIS — Z5181 Encounter for therapeutic drug level monitoring: Secondary | ICD-10-CM | POA: Diagnosis not present

## 2021-05-19 DIAGNOSIS — Z4789 Encounter for other orthopedic aftercare: Secondary | ICD-10-CM | POA: Diagnosis not present

## 2021-05-26 DIAGNOSIS — M47812 Spondylosis without myelopathy or radiculopathy, cervical region: Secondary | ICD-10-CM | POA: Diagnosis not present

## 2021-05-26 DIAGNOSIS — Z981 Arthrodesis status: Secondary | ICD-10-CM | POA: Diagnosis not present

## 2021-05-26 DIAGNOSIS — M542 Cervicalgia: Secondary | ICD-10-CM | POA: Diagnosis not present

## 2021-05-30 DIAGNOSIS — Z4789 Encounter for other orthopedic aftercare: Secondary | ICD-10-CM | POA: Diagnosis not present

## 2021-06-01 DIAGNOSIS — G8918 Other acute postprocedural pain: Secondary | ICD-10-CM | POA: Diagnosis not present

## 2021-06-01 DIAGNOSIS — G8929 Other chronic pain: Secondary | ICD-10-CM | POA: Diagnosis not present

## 2021-06-01 DIAGNOSIS — M542 Cervicalgia: Secondary | ICD-10-CM | POA: Diagnosis not present

## 2021-06-12 DIAGNOSIS — Z8249 Family history of ischemic heart disease and other diseases of the circulatory system: Secondary | ICD-10-CM | POA: Diagnosis not present

## 2021-06-12 DIAGNOSIS — R0781 Pleurodynia: Secondary | ICD-10-CM | POA: Diagnosis not present

## 2021-06-12 DIAGNOSIS — R079 Chest pain, unspecified: Secondary | ICD-10-CM | POA: Diagnosis not present

## 2021-06-12 DIAGNOSIS — J9809 Other diseases of bronchus, not elsewhere classified: Secondary | ICD-10-CM | POA: Diagnosis not present

## 2021-06-12 DIAGNOSIS — R0789 Other chest pain: Secondary | ICD-10-CM | POA: Diagnosis not present

## 2021-06-12 DIAGNOSIS — I10 Essential (primary) hypertension: Secondary | ICD-10-CM | POA: Diagnosis not present

## 2021-06-13 DIAGNOSIS — Z4789 Encounter for other orthopedic aftercare: Secondary | ICD-10-CM | POA: Diagnosis not present

## 2021-06-15 DIAGNOSIS — G8918 Other acute postprocedural pain: Secondary | ICD-10-CM | POA: Diagnosis not present

## 2021-06-16 ENCOUNTER — Other Ambulatory Visit: Payer: Self-pay | Admitting: Family Medicine

## 2021-06-16 DIAGNOSIS — E039 Hypothyroidism, unspecified: Secondary | ICD-10-CM

## 2021-06-16 DIAGNOSIS — M542 Cervicalgia: Secondary | ICD-10-CM

## 2021-06-16 NOTE — Telephone Encounter (Signed)
Requested Prescriptions  Pending Prescriptions Disp Refills  . SYNTHROID 88 MCG tablet [Pharmacy Med Name: SYNTHROID 88 MCG TABLET] 90 tablet 0    Sig: Take 1 tablet (88 mcg total) by mouth daily before breakfast.     Endocrinology:  Hypothyroid Agents Failed - 06/16/2021 10:16 AM      Failed - TSH needs to be rechecked within 3 months after an abnormal result. Refill until TSH is due.      Passed - TSH in normal range and within 360 days    TSH  Date Value Ref Range Status  09/16/2020 0.49 0.40 - 4.50 mIU/L Final         Passed - Valid encounter within last 12 months    Recent Outpatient Visits          4 months ago IFG (impaired fasting glucose)   [facility] [place] D[place] M, DO   7 months ago Fibromyalgia   [facility] Danelle Berry, PA-C   9 months ago Type 2 diabetes mellitus without complication, without long-term current use of insulin [facility])   [facility] Danelle Berry, PA-C   1 year ago Pain of upper abdomen   [facility] Danelle Berry, PA-C   1 year ago RUQ abdominal pain   [facility] Danelle Berry, PA-C      Future Appointments            In 2 weeks Danelle Berry, PA-C [facility]   In 2 months  [facility]           . meloxicam (MOBIC) 15 MG tablet [Pharmacy Med Name: MELOXICAM 15 MG TABLET] 90 tablet 0    Sig: Take 1 tablet (15 mg total) by mouth daily.     Analgesics:  COX2 Inhibitors Passed - 06/16/2021 10:16 AM      Passed - HGB in normal range and within 360 days    Hemoglobin  Date Value Ref Range Status  09/16/2020 13.6 11.7 - 15.5 g/dL Final  33/29/5188 41.6 11.1 - 15.9 g/dL Final         Passed - Cr in normal range and within 360 days    Creat  Date Value Ref Range Status  09/16/2020 0.86 0.50 - 0.99 mg/dL Final    Comment:    For patients >48 years of age, the reference limit for Creatinine is  approximately 13% higher for people identified as African-American. .    Creatinine, Urine  Date Value Ref Range Status  03/28/2018 65 20 - 275 mg/dL Final         Passed - Patient is not pregnant      Passed - Valid encounter within last 12 months    Recent Outpatient Visits          4 months ago IFG (impaired fasting glucose)   [facility] Caro Laroche, DO   7 months ago Fibromyalgia   [facility] Danelle Berry, PA-C   9 months ago Type 2 diabetes mellitus without complication, without long-term current use of insulin [facility])   [facility] Danelle Berry, PA-C   1 year ago Pain of upper abdomen   [facility] Danelle Berry, PA-C   1 year ago RUQ abdominal pain   [facility] Danelle Berry, [place]      Future Appointments  In 2 weeks [facility] Berry, [facility]   In 2 months  [facility]

## 2021-06-20 DIAGNOSIS — Z4789 Encounter for other orthopedic aftercare: Secondary | ICD-10-CM | POA: Diagnosis not present

## 2021-06-27 DIAGNOSIS — Z4789 Encounter for other orthopedic aftercare: Secondary | ICD-10-CM | POA: Diagnosis not present

## 2021-06-29 DIAGNOSIS — G8918 Other acute postprocedural pain: Secondary | ICD-10-CM | POA: Diagnosis not present

## 2021-07-04 ENCOUNTER — Encounter: Payer: Self-pay | Admitting: Family Medicine

## 2021-07-04 ENCOUNTER — Other Ambulatory Visit: Payer: Self-pay

## 2021-07-04 ENCOUNTER — Ambulatory Visit: Payer: Medicare PPO | Admitting: Family Medicine

## 2021-07-04 VITALS — BP 132/86 | HR 86 | Temp 97.6°F | Resp 16 | Ht 64.0 in | Wt 183.7 lb

## 2021-07-04 DIAGNOSIS — Z79891 Long term (current) use of opiate analgesic: Secondary | ICD-10-CM | POA: Diagnosis not present

## 2021-07-04 DIAGNOSIS — M549 Dorsalgia, unspecified: Secondary | ICD-10-CM | POA: Diagnosis not present

## 2021-07-04 DIAGNOSIS — M797 Fibromyalgia: Secondary | ICD-10-CM

## 2021-07-04 DIAGNOSIS — G8929 Other chronic pain: Secondary | ICD-10-CM

## 2021-07-04 DIAGNOSIS — G629 Polyneuropathy, unspecified: Secondary | ICD-10-CM | POA: Diagnosis not present

## 2021-07-04 DIAGNOSIS — Z5181 Encounter for therapeutic drug level monitoring: Secondary | ICD-10-CM

## 2021-07-04 DIAGNOSIS — Z09 Encounter for follow-up examination after completed treatment for conditions other than malignant neoplasm: Secondary | ICD-10-CM

## 2021-07-04 DIAGNOSIS — R0609 Other forms of dyspnea: Secondary | ICD-10-CM

## 2021-07-04 DIAGNOSIS — G8928 Other chronic postprocedural pain: Secondary | ICD-10-CM

## 2021-07-04 DIAGNOSIS — Z79899 Other long term (current) drug therapy: Secondary | ICD-10-CM

## 2021-07-04 MED ORDER — TIZANIDINE HCL 4 MG PO TABS: 4.0000 mg | ORAL_TABLET | Freq: Three times a day (TID) | ORAL | 1 refills | Status: DC | PRN

## 2021-07-04 MED ORDER — PREGABALIN 100 MG PO CAPS: ORAL_CAPSULE | ORAL | 3 refills | Status: DC

## 2021-07-04 NOTE — Progress Notes (Signed)
Name: Brittany Hill   MRN: [id number]    DOB: [birth year]   Date:07/04/2021       Progress Note  Chief Complaint  Patient presents with   Follow-up    ER: pleurisy    Pain Management    Has a gab in coverage for pain meds     Subjective:   Brittany Hill is a 70 y.o. female, presents to clinic for HFU, presented to ED for CP  CT angio neg, CXR neg, ECG and troponin unremarkable and reassuring  Also presents for pain management s/p repeated surgery  SOB when walking [place] this last summer  She had cardiac clearance with ECHO   Also need pain med management in between surgery and her f/up appt with [facility]  Feb 16th psychology appt for screening Then appt with [facility]   Lyrica 100 mg am and 200 mg pm Cymbalta 40 mg am and 20 more as needed Oxycodone 5 mg TID PRN - filled [facility] Tizanidine 4-6 mg TID Tylenol 1000 mg TID Mobic  15 mg daily  Requesting records from pharmacy - I have reviewed them (next day 8:17 PM 07/05/21 - will be scanned into record.)    Current Outpatient Medications:    acetaminophen (TYLENOL) 500 MG tablet, Take 1,000 mg by mouth every 6 (six) hours as needed., Disp: , Rfl:    calcium carbonate (OS-CAL) 600 MG TABS tablet, Take 600 mg by mouth as needed. , Disp: , Rfl:    cholecalciferol (VITAMIN D3) 25 MCG (1000 UNIT) tablet, Take 1,000 Units by mouth daily., Disp: , Rfl:    DULoxetine (CYMBALTA) 20 MG capsule, Take 2 tabs (40 mg) am and 1 tab (20 mg) as needed daily, Disp: 90 capsule, Rfl: 1   EPINEPHrine 0.3 mg/0.3 mL IJ SOAJ injection, Inject 0.3 mg into the muscle once. , Disp: , Rfl:    ezetimibe-simvastatin (VYTORIN) 10-40 MG tablet, Take 1 tablet by mouth at bedtime., Disp: 90 tablet, Rfl: 0   famotidine (PEPCID) 20 MG tablet, Take 1 tablet (20 mg total) by mouth 2 (two) times daily as needed for heartburn or indigestion., Disp: 60 tablet, Rfl: 5   meloxicam (MOBIC) 15 MG tablet,  Take 1 tablet (15 mg total) by mouth daily., Disp: 90 tablet, Rfl: 0   metFORMIN (GLUCOPHAGE-XR) 500 MG 24 hr tablet, Take 1 tablet (500 mg total) by mouth 2 (two) times daily., Disp: 180 tablet, Rfl: 3   metoprolol succinate (TOPROL-XL) 50 MG 24 hr tablet, Take 1 tablet (50 mg total) by mouth at bedtime. Take with or immediately following a meal., Disp: 90 tablet, Rfl: 3   montelukast (SINGULAIR) 10 MG tablet, Take 1 tablet (10 mg total) by mouth as needed., Disp: 90 tablet, Rfl: 0   ondansetron (ZOFRAN ODT) 4 MG disintegrating tablet, Take 1 tablet (4 mg total) by mouth every 8 (eight) hours as needed for nausea or vomiting., Disp: 20 tablet, Rfl: 1   pantoprazole (PROTONIX) 40 MG tablet, Take 1 tablet (40 mg total) by mouth at bedtime., Disp: 90 tablet, Rfl: 3   pregabalin (LYRICA) 100 MG capsule, Take 100 mg  po every am and 200 mg po at bedtime, Disp: 270 capsule, Rfl: 3   spironolactone (ALDACTONE) 50 MG tablet, Take 50 mg by mouth daily. 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   Chronic post-operative pain 09/05/2019   Neck pain 09/05/2019   Cervical radiculopathy 09/05/2019   Displacement of cervical intervertebral disc 05/14/2019   Fatty liver disease, nonalcoholic 03/28/2019   Osteopenia 10/20/2018   Obesity (BMI 30.0-34.9) 09/27/2018   Cataract, left 04/02/2018   Osteoarthritis of left ankle 02/16/2018   Asthma without status asthmaticus 10/05/2017   Genetic testing 08/28/2017    Family hx-breast malignancy 08/02/2017   Dense breast tissue on mammogram 07/28/2016   Left sided sciatica 12/19/2015   Left lumbar radiculopathy 12/16/2015   Hypomagnesemia 12/06/2015   Hypokalemia 12/02/2015   Fibromyalgia    MS (multiple sclerosis) (HCC)    Chronic constipation    OA (osteoarthritis)    Hypothyroidism    IFG (impaired fasting glucose)    Hypertension    Hyperlipidemia    GERD (gastroesophageal reflux disease)    MDD (major depressive disorder), recurrent, in full remission (HCC)    Anxiety    TMJ syndrome     Past Surgical History:  Procedure Laterality Date   ABDOMINAL HYSTERECTOMY     complete at age 45; HRT for ~25y   BACK SURGERY     CATARACT EXTRACTION W/PHACO Left 09/27/2017   Procedure: CATARACT EXTRACTION PHACO AND INTRAOCULAR LENS PLACEMENT (IOC);  Surgeon: Nevada Crane, MD;  Location: [facility] ORS;  Service: Ophthalmology;  Laterality: Left;  Lot #[id number] H [place]:   00:27.2 AP%:  13.7 CDE:  3.75   CATARACT EXTRACTION W/PHACO Right 10/25/2017   Procedure: CATARACT EXTRACTION PHACO AND INTRAOCULAR LENS PLACEMENT (IOC);  Surgeon: Nevada Crane, MD;  Location: [facility] ORS;  Service: Ophthalmology;  Laterality: Right;  Lot # [id number] H [place]:   00:23.9 AP%   :8.7 CDE:   2.10   Cataracts Bilateral    09/27/17 and 10/25/17   CHOLECYSTECTOMY  05/27/2020   COLONOSCOPY WITH ESOPHAGOGASTRODUODENOSCOPY (EGD)  12/2017   [facility]   JOINT REPLACEMENT     KNEE ARTHROSCOPY Right    x 2   lumbarectomy with fusion  [id number]   ROTATOR CUFF REPAIR     tear duct      surgery   TMJ ARTHROPLASTY     TOTAL KNEE ARTHROPLASTY Bilateral 09/2007    Family History  Problem Relation Age of Onset   Heart disease Mother    Hypertension Mother    Lymphoma Mother 8       deceased 86   Stroke Father    Heart attack Father    Heart disease Father    Hypertension Father    Alcohol abuse Father    Hypertension Sister    Colon cancer Sister 36    Hypertension Brother    Diabetes Brother    Mental illness Brother    Depression Brother    Lung cancer Brother 59       smoker; currently 38   Alcohol abuse Son    Breast cancer Maternal Aunt 75       currently 26   Breast cancer Maternal Aunt        dx 34s; mets in 32s  AAA (abdominal aortic aneurysm) Maternal Grandmother    Heart disease Maternal Grandmother    Heart attack Paternal Grandfather    Throat cancer Paternal Uncle        smoker    Social History   Tobacco Use   Smoking status: Never   Smokeless tobacco: Never  Vaping Use   Vaping Use: Never used  Substance Use Topics   Alcohol use: No    Alcohol/week: 0.0 standard drinks   Drug use: No     Allergies  Allergen Reactions   Shellfish Allergy Rash and Swelling   Codeine Nausea And Vomiting   Sulfa Antibiotics Swelling    Health Maintenance  Topic Date Due   Zoster Vaccines- Shingrix (1 of 2) Never done   Pneumonia Vaccine 28+ Years old (2 - PCV) 08/02/2018   COVID-19 Vaccine (4 - Booster for Pfizer series) 06/13/2020   INFLUENZA VACCINE  03/07/2021   HEMOGLOBIN A1C  03/16/2021   URINE MICROALBUMIN  03/22/2021   FOOT EXAM  09/16/2021   MAMMOGRAM  11/04/2021   OPHTHALMOLOGY EXAM  12/31/2021   DEXA SCAN  11/05/2022   COLONOSCOPY (Pts 45-68yrs Insurance coverage will need to be confirmed)  01/11/2023   TETANUS/TDAP  03/27/2030   Hepatitis C Screening  Addressed   HPV VACCINES  Aged Out    Chart Review Today: I personally reviewed active problem list, medication list, allergies, family history, social history, health maintenance, notes from last encounter, lab results, imaging with the patient/caregiver today.   Review of Systems  Constitutional: Negative.   HENT: Negative.    Eyes: Negative.   Respiratory: Negative.    Cardiovascular: Negative.   Gastrointestinal: Negative.   Endocrine: Negative.   Genitourinary: Negative.   Musculoskeletal: Negative.   Skin: Negative.    Allergic/Immunologic: Negative.   Neurological: Negative.   Hematological: Negative.   Psychiatric/Behavioral: Negative.    All other systems reviewed and are negative.   Objective:   There were no vitals filed for this visit.  There is no height or weight on file to calculate BMI.  Physical Exam Vitals and nursing note reviewed.  Constitutional:      General: She is not in acute distress.    Appearance: Normal appearance. She is well-developed. She is not ill-appearing, toxic-appearing or diaphoretic.     Interventions: Face mask in place.  HENT:     Head: Normocephalic and atraumatic.     Right Ear: External ear normal.     Left Ear: External ear normal.  Eyes:     General: Lids are normal. No scleral icterus.       Right eye: No discharge.        Left eye: No discharge.     Conjunctiva/sclera: Conjunctivae normal.  Neck:     Trachea: Trachea and phonation normal. No tracheal deviation.  Cardiovascular:     Rate and Rhythm: Normal rate and regular rhythm.     Pulses: Normal pulses.          Radial pulses are 2+ on the right side and 2+ on the left side.       Posterior tibial pulses are 2+ on the right side and 2+ on the left side.     Heart sounds: Normal heart sounds. No murmur heard.   No friction rub. No gallop.  Pulmonary:     Effort: Pulmonary effort is normal. No respiratory distress.     Breath sounds: Normal breath sounds. No stridor. No wheezing, rhonchi or rales.  Chest:     Chest wall: No tenderness.  Abdominal:     General: Bowel sounds are normal. There is no distension.     Palpations: Abdomen is soft.  Musculoskeletal:     Cervical back: Rigidity present. Decreased range of motion.     Right lower leg: No edema.     Left lower leg: No edema.  Skin:    General: Skin is warm and dry.     Coloration: Skin is not jaundiced or pale.     Findings: No rash.  Neurological:     Mental Status: She is alert.     Motor: No abnormal muscle tone.     Gait:  Gait normal.  Psychiatric:        Mood and Affect: Mood normal.        Speech: Speech normal.        Behavior: Behavior normal.        Assessment & Plan:     ICD-10-CM   1. Encounter for monitoring opioid maintenance therapy  Z51.81 oxyCODONE (OXY IR/ROXICODONE) 5 MG immediate release tablet   Z79.891 Drugs of abuse screen w/o alc, rtn urine-sln    oxyCODONE (OXY IR/ROXICODONE) 5 MG immediate release tablet    oxyCODONE (OXY IR/ROXICODONE) 5 MG immediate release tablet    2. Fibromyalgia  M79.7 tiZANidine (ZANAFLEX) 4 MG tablet    pregabalin (LYRICA) 100 MG capsule    3. Chronic back pain, unspecified back location, unspecified back pain laterality  M54.9 tiZANidine (ZANAFLEX) 4 MG tablet   G89.29 pregabalin (LYRICA) 100 MG capsule    oxyCODONE (OXY IR/ROXICODONE) 5 MG immediate release tablet    Drugs of abuse screen w/o alc, rtn urine-sln    oxyCODONE (OXY IR/ROXICODONE) 5 MG immediate release tablet    oxyCODONE (OXY IR/ROXICODONE) 5 MG immediate release tablet    4. Neuropathy  G62.9 pregabalin (LYRICA) 100 MG capsule    oxyCODONE (OXY IR/ROXICODONE) 5 MG immediate release tablet    Drugs of abuse screen w/o alc, rtn urine-sln    oxyCODONE (OXY IR/ROXICODONE) 5 MG immediate release tablet    oxyCODONE (OXY IR/ROXICODONE) 5 MG immediate release tablet    5. Controlled substance agreement signed  Z79.899 oxyCODONE (OXY IR/ROXICODONE) 5 MG immediate release tablet    Drugs of abuse screen w/o alc, rtn urine-sln    oxyCODONE (OXY IR/ROXICODONE) 5 MG immediate release tablet    oxyCODONE (OXY IR/ROXICODONE) 5 MG immediate release tablet   needs to be reviewed and signed by pt and witness prior to Rx being filled    6. High risk medication use  Z79.899 oxyCODONE (OXY IR/ROXICODONE) 5 MG immediate release tablet    Drugs of abuse screen w/o alc, rtn urine-sln    oxyCODONE (OXY IR/ROXICODONE) 5 MG immediate release tablet    oxyCODONE (OXY IR/ROXICODONE) 5 MG immediate release  tablet   opioids/narcotics for post-op pain - bridging to chronic pain management in Feb/March - meds TID and gradually decreasing until specialists takes over    7. Chronic post-operative pain  G89.28 oxyCODONE (OXY IR/ROXICODONE) 5 MG immediate release tablet    Drugs of abuse screen w/o alc, rtn urine-sln    oxyCODONE (OXY IR/ROXICODONE) 5 MG immediate release tablet    oxyCODONE (OXY IR/ROXICODONE) 5 MG immediate release tablet    8. Encounter for examination following treatment at hospital  Z09    er visit and results personally reviewed by me today, lungs clear, pt gradually improving    9. DOE (  dyspnea on exertion)  R06.09    pt has cardiology f/up, if its determined DOE is not cardiac in nature pt to f/up and pft's would be appropriate     Got pharmacy records, pt has [facility] f/up in Feb - needs PCP managed of postop and chronic pain until then Reviewed risk with pt, she has demonstrated ability to wean dose down, would continue to wean down Needs to complete contract and UDS and if not done prior to date of first med staff is instructed to call and d/c pain meds  Pt verbalizes understanding of risk, monitoring, sedation, dependence  Increase other meds as below to manage chronic pain: Lyrica 100 mg am and 200 mg pm - increase to 100 mg am, 100 mg noon and 200 mg pm - new rx sent in Cymbalta 40 mg am and 20 more as needed - recent refill reviewed Oxycodone 5 mg TID PRN - [facility] drug good through roughly Dec 11th Tizanidine 4-6 mg TID - refilled ordered  Tylenol 1000 mg TID continue Mobic  15 mg daily Additionally do topical meds if helpful   More than 45 min spent on encounter today More than 20 min spent on review of chart, EV, hospital visit, specialists visit, pharmacy and pain meds records in person today More than 30 min spent with pt in exam room Additional 10+ spent documenting day of OV     Return in about 3 months (around 10/04/2021)  for Routine Follow-up.   Danelle Berry, PA-C 07/04/21 1:19 PM

## 2021-07-05 ENCOUNTER — Encounter: Payer: Self-pay | Admitting: Family Medicine

## 2021-07-05 MED ORDER — OXYCODONE HCL 5 MG PO TABS: 5.0000 mg | ORAL_TABLET | Freq: Three times a day (TID) | ORAL | 0 refills | Status: AC | PRN

## 2021-07-06 DIAGNOSIS — G8928 Other chronic postprocedural pain: Secondary | ICD-10-CM | POA: Diagnosis not present

## 2021-07-06 DIAGNOSIS — G8929 Other chronic pain: Secondary | ICD-10-CM | POA: Diagnosis not present

## 2021-07-06 DIAGNOSIS — Z79891 Long term (current) use of opiate analgesic: Secondary | ICD-10-CM | POA: Diagnosis not present

## 2021-07-06 DIAGNOSIS — G629 Polyneuropathy, unspecified: Secondary | ICD-10-CM | POA: Diagnosis not present

## 2021-07-06 DIAGNOSIS — M549 Dorsalgia, unspecified: Secondary | ICD-10-CM | POA: Diagnosis not present

## 2021-07-06 DIAGNOSIS — Z79899 Other long term (current) drug therapy: Secondary | ICD-10-CM | POA: Diagnosis not present

## 2021-07-06 DIAGNOSIS — Z5181 Encounter for therapeutic drug level monitoring: Secondary | ICD-10-CM | POA: Diagnosis not present

## 2021-07-08 LAB — DRUG MONITOR, PANEL 1, W/CONF, URINE
Amphetamines: NEGATIVE ng/mL (ref <500)
Barbiturates: NEGATIVE ng/mL (ref <300)
Benzodiazepines: NEGATIVE ng/mL (ref <100)
Cocaine Metabolite: NEGATIVE ng/mL (ref <150)
Codeine: NEGATIVE ng/mL (ref <50)
Creatinine: 155.3 mg/dL (ref >=20.0)
Hydrocodone: NEGATIVE ng/mL (ref <50)
Hydromorphone: NEGATIVE ng/mL (ref <50)
Marijuana Metabolite: NEGATIVE ng/mL (ref <20)
Methadone Metabolite: NEGATIVE ng/mL (ref <100)
Morphine: NEGATIVE ng/mL (ref <50)
Norhydrocodone: NEGATIVE ng/mL (ref <50)
Noroxycodone: 4535 ng/mL — ABNORMAL HIGH (ref <50)
Opiates: NEGATIVE ng/mL (ref <100)
Oxidant: NEGATIVE ug/mL (ref <200)
Oxycodone: 3269 ng/mL — ABNORMAL HIGH (ref <50)
Oxycodone: POSITIVE ng/mL — AB (ref <100)
Oxymorphone: 1666 ng/mL — ABNORMAL HIGH (ref <50)
Phencyclidine: NEGATIVE ng/mL (ref <25)
pH: 5.6 (ref 4.5–9.0)

## 2021-07-08 LAB — DM TEMPLATE

## 2021-07-09 ENCOUNTER — Other Ambulatory Visit: Payer: Self-pay | Admitting: Family Medicine

## 2021-07-09 DIAGNOSIS — E782 Mixed hyperlipidemia: Secondary | ICD-10-CM

## 2021-07-09 DIAGNOSIS — G629 Polyneuropathy, unspecified: Secondary | ICD-10-CM

## 2021-07-09 DIAGNOSIS — F3342 Major depressive disorder, recurrent, in full remission: Secondary | ICD-10-CM

## 2021-07-09 DIAGNOSIS — G8929 Other chronic pain: Secondary | ICD-10-CM

## 2021-07-09 DIAGNOSIS — M797 Fibromyalgia: Secondary | ICD-10-CM

## 2021-07-10 NOTE — Telephone Encounter (Signed)
Requested Prescriptions  Pending Prescriptions Disp Refills  . ezetimibe-simvastatin (VYTORIN) 10-40 MG tablet [Pharmacy Med Name: EZETIMIBE-SIMV 10-40 MG] 90 tablet 0    Sig: Take 1 tablet by mouth at bedtime.     Cardiovascular:  Antilipid - Sterol Transport Inhibitors Failed - 07/09/2021  9:20 AM      Failed - HDL in normal range and within 360 days    HDL  Date Value Ref Range Status  09/16/2020 49 (L) > OR = 50 mg/dL Final  56/43/3295 56 >18 mg/dL Final    Comment:    According to ATP-III Guidelines, HDL-C >59 mg/dL is considered a negative risk factor for CHD.          Failed - Triglycerides in normal range and within 360 days    Triglycerides  Date Value Ref Range Status  09/16/2020 189 (H) <150 mg/dL Final         Passed - Total Cholesterol in normal range and within 360 days    Cholesterol, Total  Date Value Ref Range Status  06/03/2015 150 100 - 199 mg/dL Final   Cholesterol  Date Value Ref Range Status  09/16/2020 156 <200 mg/dL Final         Passed - LDL in normal range and within 360 days    LDL Cholesterol (Calc)  Date Value Ref Range Status  09/16/2020 79 mg/dL (calc) Final    Comment:    Reference range: <100 . Desirable range <100 mg/dL for primary prevention;   <70 mg/dL for patients with CHD or diabetic patients  with > or = 2 CHD risk factors. Marland Kitchen LDL-C is now calculated using the Martin-Hopkins  calculation, which is a validated novel method providing  better accuracy than the Friedewald equation in the  estimation of LDL-C.  Horald Pollen et al. Lenox Ahr. 8416;606(30): 2061-2068  (http://education.[employer].com/faq/FAQ164)          Passed - Valid encounter within last 12 months    Recent Outpatient Visits          6 days ago Encounter for monitoring opioid maintenance therapy   [facility] [place], Sheliah Mends, PA-C   5 months ago IFG (impaired fasting glucose)   [facility] Caro Laroche, DO   8 months  ago Fibromyalgia   [facility] Danelle Berry, PA-C   9 months ago Type 2 diabetes mellitus without complication, without long-term current use of insulin [facility])   [facility] Danelle Berry, PA-C   1 year ago Pain of upper abdomen   [facility] Danelle Berry, PA-C      Future Appointments            In 2 months  [facility]   In 2 months Danelle Berry, PA-C [facility], [facility]

## 2021-07-10 NOTE — Telephone Encounter (Signed)
Requested Prescriptions  Pending Prescriptions Disp Refills  . DULoxetine (CYMBALTA) 20 MG capsule [Pharmacy Med Name: DULOXETINE HCL DR 20 MG CAP] 270 capsule 0    Sig: Take 2 tabs (40 mg) am and 1 tab (20 mg) as needed daily     Psychiatry: Antidepressants - SNRI Passed - 07/09/2021  9:23 AM      Passed - Completed PHQ-2 or PHQ-9 in the last 360 days      Passed - Last BP in normal range    BP Readings from Last 1 Encounters:  07/04/21 132/86         Passed - Valid encounter within last 6 months    Recent Outpatient Visits          6 days ago Encounter for monitoring opioid maintenance therapy   [facility] [place], Sheliah Mends, PA-C   5 months ago IFG (impaired fasting glucose)   [facility] Caro Laroche, DO   8 months ago Fibromyalgia   [facility] [place], Sheliah Mends, PA-C   9 months ago Type 2 diabetes mellitus without complication, without long-term current use of insulin [facility])   [facility] Danelle Berry, PA-C   1 year ago Pain of upper abdomen   [facility] Danelle Berry, PA-C      Future Appointments            In 2 months  [facility]   In 2 months Danelle Berry, PA-C [facility], [facility]

## 2021-07-11 NOTE — Progress Notes (Signed)
Name: Brittany Hill   MRN: [id number]    DOB: Nov 16, 1950   Date:07/11/2021       Progress Note  Subjective  Chief Complaint  Sinusitis  I connected with  [name]  on 07/11/21 at 11:40 AM EST by a video enabled telemedicine application and verified that I am speaking with the correct person using two identifiers.  I discussed the limitations of evaluation and management by telemedicine and the availability of in person appointments. The patient expressed understanding and agreed to proceed with the virtual visit  Staff also discussed with the patient that there may be a patient responsible charge related to this service. Patient Location: at home  Provider Location: [facility] Additional Individuals present: alone   HPI  URI: symptoms started 2 weeks ago. Initially had sore throat, followed by rhinorrhea and nasal congestion, over the past few days had noticed frontal headaches, right ear pain, wheezing, some sob and cough. She states worse symptom is the facial pressure, congestion and drainage. She had a home covid test that was negative. No fever or change in appetite. She has tried otc medication and nasal spray but is tired of feeling sick and worried about no improvement   Asthma with acute exacerbation: she is out of rescue inhaler but used yesterday and seemed to help with wheezing. She takes singular daily   DMII: she does not check her glucose at home, but recently hospitalized and glucose within normal range and A1C in august was also normal.   Patient Active Problem List   Diagnosis Date Noted   Type 2 diabetes mellitus without complication, without long-term current use of insulin (HCC) 03/22/2020   Acquired trigger finger 01/06/2020   Bouchard's nodes (with arthropathy) 01/06/2020   Chronic post-operative pain 09/05/2019   Neck pain 09/05/2019   Cervical radiculopathy 09/05/2019   Displacement of cervical intervertebral disc 05/14/2019   Fatty liver disease,  nonalcoholic 03/28/2019   Osteopenia 10/20/2018   Obesity (BMI 30.0-34.9) 09/27/2018   Cataract, left 04/02/2018   Osteoarthritis of left ankle 02/16/2018   Asthma without status asthmaticus 10/05/2017   Genetic testing 08/28/2017   Family hx-breast malignancy 08/02/2017   Dense breast tissue on mammogram 07/28/2016   Left sided sciatica 12/19/2015   Left lumbar radiculopathy 12/16/2015   Hypomagnesemia 12/06/2015   Hypokalemia 12/02/2015   Fibromyalgia    MS (multiple sclerosis) (HCC)    Chronic constipation    OA (osteoarthritis)    Hypothyroidism    IFG (impaired fasting glucose)    Hypertension    Hyperlipidemia    GERD (gastroesophageal reflux disease)    MDD (major depressive disorder), recurrent, in full remission (HCC)    Anxiety    TMJ syndrome     Past Surgical History:  Procedure Laterality Date   ABDOMINAL HYSTERECTOMY     complete at age 26; HRT for ~25y   BACK SURGERY     CATARACT EXTRACTION W/PHACO Left 09/27/2017   Procedure: CATARACT EXTRACTION PHACO AND INTRAOCULAR LENS PLACEMENT (IOC);  Surgeon: Nevada Crane, MD;  Location: [facility] ORS;  Service: Ophthalmology;  Laterality: Left;  Lot #[id number] [place]:   00:27.2 AP%:  13.7 CDE:  3.75   CATARACT EXTRACTION W/PHACO Right 10/25/2017   Procedure: CATARACT EXTRACTION PHACO AND INTRAOCULAR LENS PLACEMENT (IOC);  Surgeon: Nevada Crane, MD;  Location: [facility] ORS;  Service: Ophthalmology;  Laterality: Right;  Lot # [id number] [place]:   00:23.9 AP%   :8.7 CDE:   2.10   Cataracts Bilateral  09/27/17 and 10/25/17   CHOLECYSTECTOMY  05/27/2020   COLONOSCOPY WITH ESOPHAGOGASTRODUODENOSCOPY (EGD)  12/2017   [facility]   JOINT REPLACEMENT     KNEE ARTHROSCOPY Right    x 2   lumbarectomy with fusion  [id number]   ROTATOR CUFF REPAIR     tear duct      surgery   TMJ ARTHROPLASTY     TOTAL KNEE ARTHROPLASTY Bilateral 09/2007    Family History  Problem Relation Age of Onset   Heart disease Mother     Hypertension Mother    Lymphoma Mother 17       deceased 75   Stroke Father    Heart attack Father    Heart disease Father    Hypertension Father    Alcohol abuse Father    Hypertension Sister    Colon cancer Sister 89   Hypertension Brother    Diabetes Brother    Mental illness Brother    Depression Brother    Lung cancer Brother 24       smoker; currently 15   Alcohol abuse Son    Breast cancer Maternal Aunt 33       currently 75   Breast cancer Maternal Aunt        dx 15s; mets in 24s   AAA (abdominal aortic aneurysm) Maternal Grandmother    Heart disease Maternal Grandmother    Heart attack Paternal Grandfather    Throat cancer Paternal Uncle        smoker    Social History   Socioeconomic History   Marital status: Married    Spouse name: Dorene Sorrow   Number of children: 2   Years of education: Not on file   Highest education level: Master's degree (e.g., MA, MS, MEng, MEd, MSW, MBA)  Occupational History   Occupation: retired  Tobacco Use   Smoking status: Never   Smokeless tobacco: Never  Vaping Use   Vaping Use: Never used  Substance and Sexual Activity   Alcohol use: No    Alcohol/week: 0.0 standard drinks   Drug use: No   Sexual activity: Not Currently  Other Topics Concern   Not on file  Social History Narrative   Not on file   Social Determinants of Health   Financial Resource Strain: Low Risk    Difficulty of Paying Living Expenses: Not hard at all  Food Insecurity: No Food Insecurity   Worried About Programme researcher, broadcasting/film/video in the Last Year: Never true   Ran Out of Food in the Last Year: Never true  Transportation Needs: No Transportation Needs   Lack of Transportation (Medical): No   Lack of Transportation (Non-Medical): No  Physical Activity: Inactive   Days of Exercise per Week: 0 days   Minutes of Exercise per Session: 0 min  Stress: Stress Concern Present   Feeling of Stress : To some extent  Social Connections: Moderately Integrated    Frequency of Communication with Friends and Family: More than three times a week   Frequency of Social Gatherings with Friends and Family: More than three times a week   Attends Religious Services: More than 4 times per year   Active Member of [employer] or Organizations: No   Attends Banker Meetings: Never   Marital Status: Married  Catering manager Violence: Not At Risk   Fear of Current or Ex-Partner: No   Emotionally Abused: No   Physically Abused: No   Sexually Abused: No  Current Outpatient Medications:    acetaminophen (TYLENOL) 500 MG tablet, Take 1,000 mg by mouth every 6 (six) hours as needed., Disp: , Rfl:    calcium carbonate (OS-CAL) 600 MG TABS tablet, Take 600 mg by mouth as needed. , Disp: , Rfl:    cholecalciferol (VITAMIN D3) 25 MCG (1000 UNIT) tablet, Take 1,000 Units by mouth daily., Disp: , Rfl:    DULoxetine (CYMBALTA) 20 MG capsule, Take 2 tabs (40 mg) am and 1 tab (20 mg) as needed daily, Disp: 270 capsule, Rfl: 1   EPINEPHrine 0.3 mg/0.3 mL IJ SOAJ injection, Inject 0.3 mg into the muscle once. , Disp: , Rfl:    ezetimibe-simvastatin (VYTORIN) 10-40 MG tablet, Take 1 tablet by mouth at bedtime., Disp: 90 tablet, Rfl: 0   famotidine (PEPCID) 20 MG tablet, Take 1 tablet (20 mg total) by mouth 2 (two) times daily as needed for heartburn or indigestion., Disp: 60 tablet, Rfl: 5   meloxicam (MOBIC) 15 MG tablet, Take 1 tablet (15 mg total) by mouth daily., Disp: 90 tablet, Rfl: 0   metFORMIN (GLUCOPHAGE-XR) 500 MG 24 hr tablet, Take 1 tablet (500 mg total) by mouth 2 (two) times daily., Disp: 180 tablet, Rfl: 3   metoprolol succinate (TOPROL-XL) 50 MG 24 hr tablet, Take 1 tablet (50 mg total) by mouth at bedtime. Take with or immediately following a meal., Disp: 90 tablet, Rfl: 3   montelukast (SINGULAIR) 10 MG tablet, Take 1 tablet (10 mg total) by mouth as needed., Disp: 90 tablet, Rfl: 0   ondansetron (ZOFRAN ODT) 4 MG disintegrating tablet, Take 1 tablet  (4 mg total) by mouth every 8 (eight) hours as needed for nausea or vomiting., Disp: 20 tablet, Rfl: 1   [START ON 07/16/2021] oxyCODONE (OXY IR/ROXICODONE) 5 MG immediate release tablet, Take 1 tablet (5 mg total) by mouth 3 (three) times daily as needed for severe pain., Disp: 90 tablet, Rfl: 0   [START ON 08/16/2021] oxyCODONE (OXY IR/ROXICODONE) 5 MG immediate release tablet, Take 1 tablet (5 mg total) by mouth 3 (three) times daily as needed for severe pain., Disp: 80 tablet, Rfl: 0   [START ON 09/15/2021] oxyCODONE (OXY IR/ROXICODONE) 5 MG immediate release tablet, Take 1 tablet (5 mg total) by mouth 3 (three) times daily as needed for severe pain., Disp: 75 tablet, Rfl: 0   pantoprazole (PROTONIX) 40 MG tablet, Take 1 tablet (40 mg total) by mouth at bedtime., Disp: 90 tablet, Rfl: 3   pregabalin (LYRICA) 100 MG capsule, Take 100 mg po qam, 100 mg po q midday and 200 mg po at bedtime for acute on chronic neck pain, postop pain, Disp: 360 capsule, Rfl: 3   spironolactone (ALDACTONE) 50 MG tablet, Take 50 mg by mouth daily. With breakfast, Disp: , Rfl:    SYNTHROID 88 MCG tablet, Take 1 tablet (88 mcg total) by mouth daily before breakfast., Disp: 90 tablet, Rfl: 0   tiZANidine (ZANAFLEX) 4 MG tablet, Take 1-1.5 tablets (4-6 mg total) by mouth every 8 (eight) hours as needed for muscle spasms., Disp: 270 tablet, Rfl: 1  Allergies  Allergen Reactions   Shellfish Allergy Rash and Swelling   Codeine Nausea And Vomiting   Sulfa Antibiotics Swelling    I personally reviewed active problem list, medication list, allergies, family history, social history, health maintenance with the patient/caregiver today.   ROS  Ten systems reviewed and is negative except as mentioned in HPI   Objective  Virtual encounter, vitals not obtained.  There is no height or weight on file to calculate BMI.  Physical Exam  Awake, alert and oriented, slight  tachypnea during video visit, but no increase of work of  breathing and speaking in full sentences   PHQ2/9: Depression screen [facility] 2/9 07/04/2021 02/01/2021 10/21/2020 09/16/2020 09/09/2020  Decreased Interest 0 0 0 0 0  Down, Depressed, Hopeless 0 1 0 0 0  PHQ - 2 Score 0 1 0 0 0  Altered sleeping 0 0 0 0 -  Tired, decreased energy 0 0 0 0 -  Change in appetite 0 0 0 0 -  Feeling bad or failure about yourself  0 0 0 0 -  Trouble concentrating 0 0 0 0 -  Moving slowly or fidgety/restless 0 0 0 0 -  Suicidal thoughts 0 0 0 0 -  PHQ-9 Score 0 1 0 0 -  Difficult doing work/chores Not difficult at all Not difficult at all Not difficult at all Not difficult at all -  Some recent data might be hidden   PHQ-2/9 Result is negative.    Fall Risk: Fall Risk  02/01/2021 10/21/2020 09/16/2020 09/09/2020 04/01/2020  Falls in the past year? 0 0 0 0 0  Number falls in past yr: 0 0 0 0 0  Injury with Fall? 0 0 0 0 0  Risk for fall due to : - - - No Fall Risks -  Follow up - - - Falls prevention discussed -     Assessment & Plan  1. Mild intermittent asthma with exacerbation  - fluticasone-salmeterol (ADVAIR) 100-50 MCG/ACT AEPB; Inhale 1 puff into the lungs 2 (two) times daily.  Dispense: 1 each; Refill: 1  2. Productive cough  - amoxicillin-clavulanate (AUGMENTIN) 875-125 MG tablet; Take 1 tablet by mouth 2 (two) times daily.  Dispense: 20 tablet; Refill: 0 Explained it may be pneumonia and if no improvement of symptoms in the next couple of days we will order a CXR   3. Acute non-recurrent frontal sinusitis  - amoxicillin-clavulanate (AUGMENTIN) 875-125 MG tablet; Take 1 tablet by mouth 2 (two) times daily.  Dispense: 20 tablet; Refill: 0   4. Dyslipidemia associated with type 2 diabetes mellitus (HCC)   Discussed oral steroids but due to DM we will try inhaler steroids and antibiotics first. She has not been checking glucose at home   I discussed the assessment and treatment plan with the patient. The patient was provided an opportunity to ask  questions and all were answered. The patient agreed with the plan and demonstrated an understanding of the instructions.  The patient was advised to call back or seek an in-person evaluation if the symptoms worsen or if the condition fails to improve as anticipated.  I provided 25  minutes of non-face-to-face time during this encounter.

## 2021-07-12 ENCOUNTER — Telehealth: Payer: Medicare PPO | Admitting: Family Medicine

## 2021-07-12 ENCOUNTER — Encounter: Payer: Self-pay | Admitting: Family Medicine

## 2021-07-12 DIAGNOSIS — E785 Hyperlipidemia, unspecified: Secondary | ICD-10-CM | POA: Diagnosis not present

## 2021-07-12 DIAGNOSIS — R058 Other specified cough: Secondary | ICD-10-CM

## 2021-07-12 DIAGNOSIS — J0110 Acute frontal sinusitis, unspecified: Secondary | ICD-10-CM

## 2021-07-12 DIAGNOSIS — E1169 Type 2 diabetes mellitus with other specified complication: Secondary | ICD-10-CM | POA: Insufficient documentation

## 2021-07-12 DIAGNOSIS — J4521 Mild intermittent asthma with (acute) exacerbation: Secondary | ICD-10-CM | POA: Diagnosis not present

## 2021-07-12 MED ORDER — ALBUTEROL SULFATE HFA 108 (90 BASE) MCG/ACT IN AERS: 2.0000 | INHALATION_SPRAY | Freq: Four times a day (QID) | RESPIRATORY_TRACT | 0 refills | Status: DC | PRN

## 2021-07-12 MED ORDER — AMOXICILLIN-POT CLAVULANATE 875-125 MG PO TABS: 1.0000 | ORAL_TABLET | Freq: Two times a day (BID) | ORAL | 0 refills | Status: DC

## 2021-07-12 MED ORDER — FLUTICASONE-SALMETEROL 100-50 MCG/ACT IN AEPB: 1.0000 | INHALATION_SPRAY | Freq: Two times a day (BID) | RESPIRATORY_TRACT | 1 refills | Status: DC

## 2021-07-19 DIAGNOSIS — Z4789 Encounter for other orthopedic aftercare: Secondary | ICD-10-CM | POA: Diagnosis not present

## 2021-07-28 DIAGNOSIS — M5412 Radiculopathy, cervical region: Secondary | ICD-10-CM | POA: Diagnosis not present

## 2021-07-28 DIAGNOSIS — M960 Pseudarthrosis after fusion or arthrodesis: Secondary | ICD-10-CM | POA: Diagnosis not present

## 2021-07-28 DIAGNOSIS — M542 Cervicalgia: Secondary | ICD-10-CM | POA: Diagnosis not present

## 2021-07-28 DIAGNOSIS — Z981 Arthrodesis status: Secondary | ICD-10-CM | POA: Diagnosis not present

## 2021-08-11 DIAGNOSIS — Z4789 Encounter for other orthopedic aftercare: Secondary | ICD-10-CM | POA: Diagnosis not present

## 2021-08-25 DIAGNOSIS — L910 Hypertrophic scar: Secondary | ICD-10-CM | POA: Diagnosis not present

## 2021-08-25 DIAGNOSIS — L820 Inflamed seborrheic keratosis: Secondary | ICD-10-CM | POA: Diagnosis not present

## 2021-08-25 DIAGNOSIS — L538 Other specified erythematous conditions: Secondary | ICD-10-CM | POA: Diagnosis not present

## 2021-08-25 DIAGNOSIS — L728 Other follicular cysts of the skin and subcutaneous tissue: Secondary | ICD-10-CM | POA: Diagnosis not present

## 2021-08-25 DIAGNOSIS — D2361 Other benign neoplasm of skin of right upper limb, including shoulder: Secondary | ICD-10-CM | POA: Diagnosis not present

## 2021-08-29 DIAGNOSIS — Z4789 Encounter for other orthopedic aftercare: Secondary | ICD-10-CM | POA: Diagnosis not present

## 2021-09-01 ENCOUNTER — Other Ambulatory Visit: Payer: Self-pay | Admitting: Family Medicine

## 2021-09-01 DIAGNOSIS — J4521 Mild intermittent asthma with (acute) exacerbation: Secondary | ICD-10-CM

## 2021-09-01 DIAGNOSIS — R059 Cough, unspecified: Secondary | ICD-10-CM

## 2021-09-12 DIAGNOSIS — Z5181 Encounter for therapeutic drug level monitoring: Secondary | ICD-10-CM | POA: Diagnosis not present

## 2021-09-12 DIAGNOSIS — G8928 Other chronic postprocedural pain: Secondary | ICD-10-CM | POA: Diagnosis not present

## 2021-09-12 DIAGNOSIS — Z79891 Long term (current) use of opiate analgesic: Secondary | ICD-10-CM | POA: Diagnosis not present

## 2021-09-12 DIAGNOSIS — Z981 Arthrodesis status: Secondary | ICD-10-CM | POA: Diagnosis not present

## 2021-09-12 DIAGNOSIS — M542 Cervicalgia: Secondary | ICD-10-CM | POA: Diagnosis not present

## 2021-09-12 DIAGNOSIS — G8929 Other chronic pain: Secondary | ICD-10-CM | POA: Diagnosis not present

## 2021-09-13 ENCOUNTER — Ambulatory Visit (INDEPENDENT_AMBULATORY_CARE_PROVIDER_SITE_OTHER): Payer: Medicare PPO

## 2021-09-13 DIAGNOSIS — Z0000 Encounter for general adult medical examination without abnormal findings: Secondary | ICD-10-CM

## 2021-09-13 DIAGNOSIS — Z1231 Encounter for screening mammogram for malignant neoplasm of breast: Secondary | ICD-10-CM

## 2021-09-13 NOTE — Patient Instructions (Signed)
Ms. Brittany Hill , Thank you for taking time to come for your Medicare Wellness Visit. I appreciate your ongoing commitment to your health goals. Please review the following plan we discussed and let me know if I can assist you in the future.   Screening recommendations/referrals: Colonoscopy: done 01/10/18. Repeat 01/2023 Mammogram: done 11/04/20. Please call [phone] to schedule your mammogram.  Bone Density: done 11/04/20 Recommended yearly ophthalmology/optometry visit for glaucoma screening and checkup Recommended yearly dental visit for hygiene and checkup  Vaccinations: Influenza vaccine: done 05/07/21 Pneumococcal vaccine: done 08/02/17 Tdap vaccine: done 03/27/20 Shingles vaccine: Shingrix discussed. Please contact your pharmacy for coverage information.  Covid-19: done 08/26/19, 09/17/19, 04/18/20, 02/01/21 & 08/02/21  Advanced directives: Please bring a copy of your health care power of attorney and living will to the office at your convenience.   Conditions/risks identified: Keep up the great work!   Next appointment: Follow up in one year for your annual wellness visit    Preventive Care 65 Years and Older, Female Preventive care refers to lifestyle choices and visits with your health care provider that can promote health and wellness. What does preventive care include? A yearly physical exam. This is also called an annual well check. Dental exams once or twice a year. Routine eye exams. Ask your health care provider how often you should have your eyes checked. Personal lifestyle choices, including: Daily care of your teeth and gums. Regular physical activity. Eating a healthy diet. Avoiding tobacco and drug use. Limiting alcohol use. Practicing safe sex. Taking low-dose aspirin every day. Taking vitamin and mineral supplements as recommended by your health care provider. What happens during an annual well check? The services and screenings done by your health care  provider during your annual well check will depend on your age, overall health, lifestyle risk factors, and family history of disease. Counseling  Your health care provider may ask you questions about your: Alcohol use. Tobacco use. Drug use. Emotional well-being. Home and relationship well-being. Sexual activity. Eating habits. History of falls. Memory and ability to understand (cognition). Work and work Statistician. Reproductive health. Screening  You may have the following tests or measurements: Height, weight, and BMI. Blood pressure. Lipid and cholesterol levels. These may be checked every 5 years, or more frequently if you are over 46 years old. Skin check. Lung cancer screening. You may have this screening every year starting at age 68 if you have a 30-pack-year history of smoking and currently smoke or have quit within the past 15 years. Fecal occult blood test (FOBT) of the stool. You may have this test every year starting at age 57. Flexible sigmoidoscopy or colonoscopy. You may have a sigmoidoscopy every 5 years or a colonoscopy every 10 years starting at age 29. Hepatitis C blood test. Hepatitis B blood test. Sexually transmitted disease (STD) testing. Diabetes screening. This is done by checking your blood sugar (glucose) after you have not eaten for a while (fasting). You may have this done every 1-3 years. Bone density scan. This is done to screen for osteoporosis. You may have this done starting at age 14. Mammogram. This may be done every 1-2 years. Talk to your health care provider about how often you should have regular mammograms. Talk with your health care provider about your test results, treatment options, and if necessary, the need for more tests. Vaccines  Your health care provider may recommend certain vaccines, such as: Influenza vaccine. This is recommended every year. Tetanus, diphtheria, and acellular pertussis (Tdap,  Td) vaccine. You may need a Td  booster every 10 years. Zoster vaccine. You may need this after age 30. Pneumococcal 13-valent conjugate (PCV13) vaccine. One dose is recommended after age 16. Pneumococcal polysaccharide (PPSV23) vaccine. One dose is recommended after age 35. Talk to your health care provider about which screenings and vaccines you need and how often you need them. This information is not intended to replace advice given to you by your health care provider. Make sure you discuss any questions you have with your health care provider. Document Released: 08/20/2015 Document Revised: 04/12/2016 Document Reviewed: 05/25/2015 Elsevier Interactive Patient Education  2017 [place] Prevention in the Home Falls can cause injuries. They can happen to people of all ages. There are many things you can do to make your home safe and to help prevent falls. What can I do on the outside of my home? Regularly fix the edges of walkways and driveways and fix any cracks. Remove anything that might make you trip as you walk through a door, such as a raised step or threshold. Trim any bushes or trees on the path to your home. Use bright outdoor lighting. Clear any walking paths of anything that might make someone trip, such as rocks or tools. Regularly check to see if handrails are loose or broken. Make sure that both sides of any steps have handrails. Any raised decks and porches should have guardrails on the edges. Have any leaves, snow, or ice cleared regularly. Use sand or salt on walking paths during winter. Clean up any spills in your garage right away. This includes oil or grease spills. What can I do in the bathroom? Use night lights. Install grab bars by the toilet and in the tub and shower. Do not use towel bars as grab bars. Use non-skid mats or decals in the tub or shower. If you need to sit down in the shower, use a plastic, non-slip stool. Keep the floor dry. Clean up any water that spills on the floor  as soon as it happens. Remove soap buildup in the tub or shower regularly. Attach bath mats securely with double-sided non-slip rug tape. Do not have throw rugs and other things on the floor that can make you trip. What can I do in the bedroom? Use night lights. Make sure that you have a light by your bed that is easy to reach. Do not use any sheets or blankets that are too big for your bed. They should not hang down onto the floor. Have a firm chair that has side arms. You can use this for support while you get dressed. Do not have throw rugs and other things on the floor that can make you trip. What can I do in the kitchen? Clean up any spills right away. Avoid walking on wet floors. Keep items that you use a lot in easy-to-reach places. If you need to reach something above you, use a strong step stool that has a grab bar. Keep electrical cords out of the way. Do not use floor polish or wax that makes floors slippery. If you must use wax, use non-skid floor wax. Do not have throw rugs and other things on the floor that can make you trip. What can I do with my stairs? Do not leave any items on the stairs. Make sure that there are handrails on both sides of the stairs and use them. Fix handrails that are broken or loose. Make sure that handrails are as  long as the stairways. Check any carpeting to make sure that it is firmly attached to the stairs. Fix any carpet that is loose or worn. Avoid having throw rugs at the top or bottom of the stairs. If you do have throw rugs, attach them to the floor with carpet tape. Make sure that you have a light switch at the top of the stairs and the bottom of the stairs. If you do not have them, ask someone to add them for you. What else can I do to help prevent falls? Wear shoes that: Do not have high heels. Have rubber bottoms. Are comfortable and fit you well. Are closed at the toe. Do not wear sandals. If you use a stepladder: Make sure that it is  fully opened. Do not climb a closed stepladder. Make sure that both sides of the stepladder are locked into place. Ask someone to hold it for you, if possible. Clearly mark and make sure that you can see: Any grab bars or handrails. First and last steps. Where the edge of each step is. Use tools that help you move around (mobility aids) if they are needed. These include: Canes. Walkers. Scooters. Crutches. Turn on the lights when you go into a dark area. Replace any light bulbs as soon as they burn out. Set up your furniture so you have a clear path. Avoid moving your furniture around. If any of your floors are uneven, fix them. If there are any pets around you, be aware of where they are. Review your medicines with your doctor. Some medicines can make you feel dizzy. This can increase your chance of falling. Ask your doctor what other things that you can do to help prevent falls. This information is not intended to replace advice given to you by your health care provider. Make sure you discuss any questions you have with your health care provider. Document Released: 05/20/2009 Document Revised: 12/30/2015 Document Reviewed: 08/28/2014 Elsevier Interactive Patient Education  2017 [employer].

## 2021-09-13 NOTE — Progress Notes (Signed)
Subjective:   Brittany Hill is a 71 y.o. female who presents for Medicare Annual (Subsequent) preventive examination.  Virtual Visit via Telephone Note  I connected with  Brittany Hill on 09/13/21 at  3:30 PM EST by telephone and verified that I am speaking with the correct person using two identifiers.  Location: Patient: home Provider: [facility] Persons participating in the virtual visit: patient/Nurse Health Advisor   I discussed the limitations, risks, security and privacy concerns of performing an evaluation and management service by telephone and the availability of in person appointments. The patient expressed understanding and agreed to proceed.  Interactive audio and video telecommunications were attempted between this nurse and patient, however failed, due to patient having technical difficulties OR patient did not have access to video capability.  We continued and completed visit with audio only.  Some vital signs may be absent or patient reported.   Reather Littler, LPN   Review of Systems     Cardiac Risk Factors include: advanced age (>30men, >77 women);dyslipidemia;hypertension;diabetes mellitus;obesity (BMI >30kg/m2)     Objective:    Today's Vitals   09/13/21 1535  PainSc: 4    There is no height or weight on file to calculate BMI.  Advanced Directives 09/13/2021 09/09/2020 08/14/2019 08/06/2018 10/25/2017 09/27/2017 10/27/2016  Does Patient Have a Medical Advance Directive? Yes Yes Yes Yes Yes Yes Yes  Type of Estate agent of [place];Living will Healthcare Power of [place];Living will Healthcare Power of [place];Living will Healthcare Power of [place];Living will Healthcare Power of [place];Living will Healthcare Power of [place];Living will -  Does patient want to make changes to medical advance directive? - - - - No - Patient declined No - Patient declined -  Copy of Healthcare Power of Attorney in Chart? No - copy requested No - copy  requested No - copy requested No - copy requested No - copy requested No - copy requested -  Would patient like information on creating a medical advance directive? - - - - - - -    Current Medications (verified) Outpatient Encounter Medications as of 09/13/2021  Medication Sig   acetaminophen (TYLENOL) 500 MG tablet Take 1,000 mg by mouth every 6 (six) hours as needed.   albuterol (VENTOLIN HFA) 108 (90 Base) MCG/ACT inhaler Inhale 2 puffs into the lungs every 6 (six) hours as needed for wheezing or shortness of breath.   calcium carbonate (OS-CAL) 600 MG TABS tablet Take 600 mg by mouth as needed.    cholecalciferol (VITAMIN D3) 25 MCG (1000 UNIT) tablet Take 1,000 Units by mouth daily.   DULoxetine (CYMBALTA) 20 MG capsule Take 2 tabs (40 mg) am and 1 tab (20 mg) as needed daily   EPINEPHrine 0.3 mg/0.3 mL IJ SOAJ injection Inject 0.3 mg into the muscle once.    ezetimibe-simvastatin (VYTORIN) 10-40 MG tablet Take 1 tablet by mouth at bedtime.   famotidine (PEPCID) 20 MG tablet Take 1 tablet (20 mg total) by mouth 2 (two) times daily as needed for heartburn or indigestion.   fluticasone-salmeterol (ADVAIR) 100-50 MCG/ACT AEPB Inhale 1 puff into the lungs 2 (two) times daily.   meloxicam (MOBIC) 15 MG tablet Take 1 tablet (15 mg total) by mouth daily.   metFORMIN (GLUCOPHAGE-XR) 500 MG 24 hr tablet Take 1 tablet (500 mg total) by mouth 2 (two) times daily.   metoprolol succinate (TOPROL-XL) 50 MG 24 hr tablet Take 1 tablet (50 mg total) by mouth at bedtime. Take with or immediately following  a meal.   montelukast (SINGULAIR) 10 MG tablet Take 1 tablet (10 mg total) by mouth at bedtime.   ondansetron (ZOFRAN ODT) 4 MG disintegrating tablet Take 1 tablet (4 mg total) by mouth every 8 (eight) hours as needed for nausea or vomiting.   oxyCODONE (OXY IR/ROXICODONE) 5 MG immediate release tablet Take 1 tablet (5 mg total) by mouth 3 (three) times daily as needed for severe pain.   pantoprazole  (PROTONIX) 40 MG tablet Take 1 tablet (40 mg total) by mouth at bedtime.   pregabalin (LYRICA) 100 MG capsule Take 100 mg po qam, 100 mg po q midday and 200 mg po at bedtime for acute on chronic neck pain, postop pain   spironolactone (ALDACTONE) 50 MG tablet Take 50 mg by mouth daily. With breakfast   SYNTHROID 88 MCG tablet Take 1 tablet (88 mcg total) by mouth daily before breakfast.   tiZANidine (ZANAFLEX) 4 MG tablet Take 1-1.5 tablets (4-6 mg total) by mouth every 8 (eight) hours as needed for muscle spasms.   naloxone (NARCAN) nasal spray 4 mg/0.1 mL Place into the nose.   [START ON 09/15/2021] oxyCODONE (OXY IR/ROXICODONE) 5 MG immediate release tablet Take 1 tablet (5 mg total) by mouth 3 (three) times daily as needed for severe pain.   [DISCONTINUED] amoxicillin-clavulanate (AUGMENTIN) 875-125 MG tablet Take 1 tablet by mouth 2 (two) times daily.   No facility-administered encounter medications on file as of 09/13/2021.    Allergies (verified) Shellfish allergy, Codeine, and Sulfa antibiotics   History: Past Medical History:  Diagnosis Date   Allergy    Anxiety    Asthma    as a teenager   Blood transfusion without reported diagnosis 2009   My own blood   Cataract    Chronic constipation    Complication of anesthesia    Depression    Dysrhythmia    Fibromyalgia    Bells Palsy   Full code status 08/02/2017   Patient wishes to have resuscitative efforts made if collapses; but does not want to remain on life support or if condition is terminal -- see copy of living will   Genetic testing 08/28/2017   Multi-Cancer panel (83 genes) @ [facility] - No pathogenic mutations detected   GERD (gastroesophageal reflux disease)    Heart murmur    Hyperlipidemia    Hypertension    Hypothyroidism    IFG (impaired fasting glucose)    MS (multiple sclerosis) (HCC)    hx of long-term corticosteroid use   OA (osteoarthritis)    osteoporosis   Osteoporosis 05/2014   managed by  Rheumatologist   Pneumonia 2017   PONV (postoperative nausea and vomiting)    TMJ syndrome    Past Surgical History:  Procedure Laterality Date   ABDOMINAL HYSTERECTOMY     complete at age 52; HRT for ~25y   BACK SURGERY     CATARACT EXTRACTION W/PHACO Left 09/27/2017   Procedure: CATARACT EXTRACTION PHACO AND INTRAOCULAR LENS PLACEMENT (IOC);  Surgeon: Nevada Crane, MD;  Location: [facility] ORS;  Service: Ophthalmology;  Laterality: Left;  Lot #[id number] H [place]:   00:27.2 AP%:  13.7 CDE:  3.75   CATARACT EXTRACTION W/PHACO Right 10/25/2017   Procedure: CATARACT EXTRACTION PHACO AND INTRAOCULAR LENS PLACEMENT (IOC);  Surgeon: Nevada Crane, MD;  Location: [facility] ORS;  Service: Ophthalmology;  Laterality: Right;  Lot # [id number] H [place]:   00:23.9 AP%   :8.7 CDE:   2.10   Cataracts Bilateral    09/27/17  and 10/25/17   CHOLECYSTECTOMY  05/27/2020   COLONOSCOPY WITH ESOPHAGOGASTRODUODENOSCOPY (EGD)  12/2017   [facility]   EYE SURGERY  2022   JOINT REPLACEMENT     KNEE ARTHROSCOPY Right    x 2   lumbarectomy with fusion  12/06/2015   ROTATOR CUFF REPAIR     SPINE SURGERY  2018?   tear duct      surgery   TMJ ARTHROPLASTY     TOTAL KNEE ARTHROPLASTY Bilateral 09/2007   Family History  Problem Relation Age of Onset   Heart disease Mother    Hypertension Mother    Lymphoma Mother 12       deceased 86   Arthritis Mother    Cancer Mother    Varicose Veins Mother    Stroke Father    Heart attack Father    Heart disease Father    Hypertension Father    Alcohol abuse Father    Early death Father    Hypertension Sister    Colon cancer Sister 17   Cancer Sister    Miscarriages / Stillbirths Sister    Hypertension Brother    Diabetes Brother    Mental illness Brother    Depression Brother    Lung cancer Brother 53       smoker; currently 5   Cancer Brother    Learning disabilities Brother    Alcohol abuse Son    Breast cancer Maternal Aunt 45       currently 68    Breast cancer Maternal Aunt        dx 75s; mets in 69s   AAA (abdominal aortic aneurysm) Maternal Grandmother    Heart disease Maternal Grandmother    Heart attack Paternal Grandfather    Throat cancer Paternal Uncle        smoker   Social History   Socioeconomic History   Marital status: Married    Spouse name: Dorene Sorrow   Number of children: 2   Years of education: Not on file   Highest education level: Master's degree (e.g., MA, MS, MEng, MEd, MSW, MBA)  Occupational History   Occupation: retired  Tobacco Use   Smoking status: Never   Smokeless tobacco: Never  Vaping Use   Vaping Use: Never used  Substance and Sexual Activity   Alcohol use: No   Drug use: No   Sexual activity: Not Currently    Birth control/protection: None  Other Topics Concern   Not on file  Social History Narrative   Not on file   Social Determinants of Health   Financial Resource Strain: Low Risk    Difficulty of Paying Living Expenses: Not hard at all  Food Insecurity: No Food Insecurity   Worried About Programme researcher, broadcasting/film/video in the Last Year: Never true   Ran Out of Food in the Last Year: Never true  Transportation Needs: No Transportation Needs   Lack of Transportation (Medical): No   Lack of Transportation (Non-Medical): No  Physical Activity: Insufficiently Active   Days of Exercise per Week: 2 days   Minutes of Exercise per Session: 60 min  Stress: No Stress Concern Present   Feeling of Stress : Not at all  Social Connections: Moderately Integrated   Frequency of Communication with Friends and Family: More than three times a week   Frequency of Social Gatherings with Friends and Family: More than three times a week   Attends Religious Services: More than 4 times per year  Active Member of Clubs or Organizations: No   Attends Banker Meetings: Never   Marital Status: Married    Tobacco Counseling Counseling given: Not Answered   Clinical Intake:  Pre-visit  preparation completed: Yes  Pain : 0-10 Pain Score: 4  Pain Type: Chronic pain Pain Location: Neck Pain Orientation: Posterior Pain Descriptors / Indicators: Aching, Sore Pain Onset: More than a month ago Pain Frequency: Constant     Nutritional Risks: None Diabetes: Yes CBG done?: No Did pt. bring in CBG monitor from home?: No  How often do you need to have someone help you when you read instructions, pamphlets, or other written materials from your doctor or pharmacy?: 1 - Never  Nutrition Risk Assessment:  Has the patient had any N/V/D within the last 2 months?  No  Does the patient have any non-healing wounds?  No  Has the patient had any unintentional weight loss or weight gain?  No   Diabetes:  Is the patient diabetic?  Yes  If diabetic, was a CBG obtained today?  No  Did the patient bring in their glucometer from home?  No  How often do you monitor your CBG's? Pt does not actively check blood sugar.   Financial Strains and Diabetes Management:  Are you having any financial strains with the device, your supplies or your medication? No .  Does the patient want to be seen by Chronic Care Management for management of their diabetes?  No  Would the patient like to be referred to a Nutritionist or for Diabetic Management?  No   Diabetic Exams:  Diabetic Eye Exam: Completed 12/31/20.   Diabetic Foot Exam: Completed 09/16/20.  Interpreter Needed?: No  Information entered by :: Reather Littler LPN   Activities of Daily Living In your present state of health, do you have any difficulty performing the following activities: 09/13/2021 07/12/2021  Hearing? N N  Vision? N N  Difficulty concentrating or making decisions? N N  Walking or climbing stairs? N N  Dressing or bathing? N N  Doing errands, shopping? N N  Preparing Food and eating ? N -  Using the Toilet? N -  In the past six months, have you accidently leaked urine? N -  Do you have problems with loss of bowel  control? N -  Managing your Medications? N -  Managing your Finances? N -  Housekeeping or managing your Housekeeping? N -  Some recent data might be hidden    Patient Care Team: Danelle Berry, PA-C as PCP - General (Family Medicine) Bettey Mare, MD as Referring Physician (Neurosurgery) Lady Gary Darlin Priestly, MD as Consulting Physician (Cardiology) Tendler, Cliffton Asters (Inactive) as Referring Physician Arlis Porta, MD as Referring Physician (Neurosurgery) Leda Min, MD as Referring Physician (Anesthesiology)  Indicate any recent Medical Services you may have received from other than [facility] providers in the past year (date may be approximate).     Assessment:   This is a routine wellness examination for Shalayna.  Hearing/Vision screen Hearing Screening - Comments:: Pt denies hearing difficulty Vision Screening - Comments:: Annual vision screenings done [facility] Dr. Brooke Dare  Dietary issues and exercise activities discussed: Current Exercise Habits: Structured exercise class, Type of exercise: Other - see comments (water aerobics), Time (Minutes): 60, Frequency (Times/Week): 2, Weekly Exercise (Minutes/Week): 120, Intensity: Moderate, Exercise limited by: orthopedic condition(s)   Goals Addressed   None    Depression Screen [facility] 2/9 Scores 09/13/2021 07/12/2021 07/04/2021 02/01/2021 10/21/2020 09/16/2020 09/09/2020  PHQ - 2 Score 0 0 0 1 0 0 0  PHQ- 9 Score - 0 0 1 0 0 -    Fall Risk Fall Risk  09/13/2021 07/12/2021 02/01/2021 10/21/2020 09/16/2020  Falls in the past year? 0 0 0 0 0  Number falls in past yr: 0 0 0 0 0  Injury with Fall? 0 0 0 0 0  Risk for fall due to : No Fall Risks No Fall Risks - - -  Follow up Falls prevention discussed Falls prevention discussed - - -    FALL RISK PREVENTION PERTAINING TO THE HOME:  Any stairs in or around the home? Yes  If so, are there any without handrails? No  Home free of loose throw rugs in walkways, pet beds, electrical  cords, etc? Yes  Adequate lighting in your home to reduce risk of falls? Yes   ASSISTIVE DEVICES UTILIZED TO PREVENT FALLS:  Life alert? No  Use of a cane, walker or w/c? No  Grab bars in the bathroom? Yes  Shower chair or bench in shower? Yes  Elevated toilet seat or a handicapped toilet? Yes   TIMED UP AND GO:  Was the test performed? No . Telephonic visit  Cognitive Function: Normal cognitive status assessed by direct observation by this Nurse Health Advisor. No abnormalities found.       6CIT Screen 08/06/2018  What Year? 0 points  What month? 0 points  What time? 0 points  Count back from 20 0 points  Months in reverse 0 points  Repeat phrase 0 points  Total Score 0    Immunizations Immunization History  Administered Date(s) Administered   Fluad Quad(high Dose 65+) 05/02/2019, 05/17/2020   Influenza, High Dose Seasonal PF 05/16/2018, 05/07/2021   Influenza,inj,Quad PF,6+ Mos 06/03/2015   Influenza-Unspecified 05/17/2012, 05/10/2017   PFIZER(Purple Top)SARS-COV-2 Vaccination 08/26/2019, 09/17/2019, 04/18/2020, 02/01/2021   Pfizer Covid-19 Vaccine Bivalent Booster 65yrs & up 08/02/2021   Pneumococcal Polysaccharide-23 08/02/2017   Td 06/19/1999   Tdap 03/27/2020   Zoster, Live 08/08/2011    TDAP status: Up to date  Flu Vaccine status: Up to date  Pneumococcal vaccine status: Up to date  Covid-19 vaccine status: Completed vaccines  Qualifies for Shingles Vaccine? Yes   Zostavax completed Yes   Shingrix Completed?: No.    Education has been provided regarding the importance of this vaccine. Patient has been advised to call insurance company to determine out of pocket expense if they have not yet received this vaccine. Advised may also receive vaccine at local pharmacy or Health Dept. Verbalized acceptance and understanding.  Screening Tests Health Maintenance  Topic Date Due   Zoster Vaccines- Shingrix (1 of 2) Never done   Pneumonia Vaccine 80+ Years old  (2 - PCV) 08/02/2018   HEMOGLOBIN A1C  03/16/2021   URINE MICROALBUMIN  03/22/2021   FOOT EXAM  09/16/2021   MAMMOGRAM  11/04/2021   OPHTHALMOLOGY EXAM  12/31/2021   DEXA SCAN  11/05/2022   COLONOSCOPY (Pts 45-68yrs Insurance coverage will need to be confirmed)  01/11/2023   TETANUS/TDAP  03/27/2030   INFLUENZA VACCINE  Completed   COVID-19 Vaccine  Completed   Hepatitis C Screening  Addressed   HPV VACCINES  Aged Out    Health Maintenance  Health Maintenance Due  Topic Date Due   Zoster Vaccines- Shingrix (1 of 2) Never done   Pneumonia Vaccine 35+ Years old (2 - PCV) 08/02/2018   HEMOGLOBIN A1C  03/16/2021   URINE MICROALBUMIN  03/22/2021    Colorectal cancer screening: Type of screening: Colonoscopy. Completed 01/10/18. Repeat every 5 years  Mammogram status: Completed 11/04/20. Repeat every year  Bone Density status: Completed 11/04/20. Results reflect: Bone density results: OSTEOPENIA. Repeat every 2 years.  Lung Cancer Screening: (Low Dose CT Chest recommended if Age 45-80 years, 30 pack-year currently smoking OR have quit w/in 15years.) does not qualify.    Additional Screening:  Hepatitis C Screening: does qualify; Completed 05/07/14  Vision Screening: Recommended annual ophthalmology exams for early detection of glaucoma and other disorders of the eye. Is the patient up to date with their annual eye exam?  Yes  Who is the provider or what is the name of the office in which the patient attends annual eye exams? [facility].   Dental Screening: Recommended annual dental exams for proper oral hygiene  Community Resource Referral / Chronic Care Management: CRR required this visit?  No   CCM required this visit?  No      Plan:     I have personally reviewed and noted the following in the patients chart:   Medical and social history Use of alcohol, tobacco or illicit drugs  Current medications and supplements including opioid prescriptions.   Functional ability and status Nutritional status Physical activity Advanced directives List of other physicians Hospitalizations, surgeries, and ER visits in previous 12 months Vitals Screenings to include cognitive, depression, and falls Referrals and appointments  In addition, I have reviewed and discussed with patient certain preventive protocols, quality metrics, and best practice recommendations. A written personalized care plan for preventive services as well as general preventive health recommendations were provided to patient.     Reather Littler, LPN   08/12/1094   Nurse Notes: none

## 2021-09-15 DIAGNOSIS — H04551 Acquired stenosis of right nasolacrimal duct: Secondary | ICD-10-CM | POA: Diagnosis not present

## 2021-09-20 DIAGNOSIS — I10 Essential (primary) hypertension: Secondary | ICD-10-CM | POA: Diagnosis not present

## 2021-09-20 DIAGNOSIS — E782 Mixed hyperlipidemia: Secondary | ICD-10-CM | POA: Diagnosis not present

## 2021-09-20 DIAGNOSIS — E669 Obesity, unspecified: Secondary | ICD-10-CM | POA: Diagnosis not present

## 2021-09-20 DIAGNOSIS — R079 Chest pain, unspecified: Secondary | ICD-10-CM | POA: Diagnosis not present

## 2021-09-20 DIAGNOSIS — E119 Type 2 diabetes mellitus without complications: Secondary | ICD-10-CM | POA: Diagnosis not present

## 2021-09-26 ENCOUNTER — Other Ambulatory Visit: Payer: Self-pay | Admitting: Family Medicine

## 2021-09-26 DIAGNOSIS — E039 Hypothyroidism, unspecified: Secondary | ICD-10-CM

## 2021-09-27 DIAGNOSIS — F331 Major depressive disorder, recurrent, moderate: Secondary | ICD-10-CM | POA: Diagnosis not present

## 2021-09-29 DIAGNOSIS — L720 Epidermal cyst: Secondary | ICD-10-CM | POA: Diagnosis not present

## 2021-09-29 DIAGNOSIS — L728 Other follicular cysts of the skin and subcutaneous tissue: Secondary | ICD-10-CM | POA: Diagnosis not present

## 2021-09-29 DIAGNOSIS — R208 Other disturbances of skin sensation: Secondary | ICD-10-CM | POA: Diagnosis not present

## 2021-09-30 ENCOUNTER — Ambulatory Visit: Payer: Self-pay

## 2021-09-30 NOTE — Telephone Encounter (Signed)
[facility] agent reports the practice requests the pt. Be informed there is no availability today and she needs to go to [facility]. Pt. Verbalizes understanding.

## 2021-10-03 ENCOUNTER — Other Ambulatory Visit: Payer: Self-pay

## 2021-10-03 ENCOUNTER — Encounter: Payer: Self-pay | Admitting: Nurse Practitioner

## 2021-10-03 ENCOUNTER — Ambulatory Visit: Payer: Medicare PPO | Admitting: Nurse Practitioner

## 2021-10-03 VITALS — BP 124/80 | HR 80 | Temp 97.6°F | Resp 16 | Ht 64.0 in | Wt 177.3 lb

## 2021-10-03 DIAGNOSIS — E039 Hypothyroidism, unspecified: Secondary | ICD-10-CM | POA: Diagnosis not present

## 2021-10-03 DIAGNOSIS — I10 Essential (primary) hypertension: Secondary | ICD-10-CM | POA: Diagnosis not present

## 2021-10-03 DIAGNOSIS — E782 Mixed hyperlipidemia: Secondary | ICD-10-CM | POA: Diagnosis not present

## 2021-10-03 DIAGNOSIS — E119 Type 2 diabetes mellitus without complications: Secondary | ICD-10-CM | POA: Diagnosis not present

## 2021-10-03 DIAGNOSIS — J069 Acute upper respiratory infection, unspecified: Secondary | ICD-10-CM

## 2021-10-03 DIAGNOSIS — J4521 Mild intermittent asthma with (acute) exacerbation: Secondary | ICD-10-CM | POA: Diagnosis not present

## 2021-10-03 MED ORDER — MONTELUKAST SODIUM 10 MG PO TABS: 10.0000 mg | ORAL_TABLET | Freq: Every day | ORAL | 3 refills | Status: DC

## 2021-10-03 MED ORDER — EZETIMIBE-SIMVASTATIN 10-40 MG PO TABS: 1.0000 | ORAL_TABLET | Freq: Every day | ORAL | 3 refills | Status: DC

## 2021-10-03 NOTE — Progress Notes (Signed)
BP 124/80    Pulse 80    Temp 97.6 F (36.4 C) (Oral)    Resp 16    Ht 5\' 4"  (1.626 m)    Wt 177 lb 4.8 oz (80.4 kg)    SpO2 98%    BMI 30.43 kg/m    Subjective:    Patient ID: , female    DOB: April 22, 1951, 71 y.o.   MRN: [id number]  HPI: Brittany Hill is a 71 y.o. female, here alone  Chief Complaint  Patient presents with   URI    Cough, runny nose, chills, low grade fever, muscle pain for 1 week Covid test negative   Hyperlipidemia   Diabetes    3 month follow  up   DM2:She is due for foot exam, microalbumin and A1C.  She says she currently takes metformin 500 mg BID. She does not check her blood sugar at home. She denies any polyuria, polydipsia, or polyphagia. She is on a statin.  Last A1C was 6.3 on 04/04/2021.  Will get labs today.   Hypertension: Her blood pressure today is 124/80.  She denies any headaches, chest pain, shortness of breath or blurred vision. She is currently on metoprolol 50 mg daily and spironolactone 50 mg daily.   Hyperlipidemia: Her last LDL was 79 on 09/16/2020.  Will get labs today. She says she does take her ezetimibe-simvastatin 10-40 mg daily.  She denies any myalgia.   Hypothyroidism: Her last TSH was 0.49 on 09/16/2020. She is currently on synthroid 88 mcg daily.  Will get labs and then send appropriate refill.   URI: She says she has had symptoms for about a week.  Symptoms include nasal congestion, low grade fever, body aches, cough, and sore throat. She denies any shortness of breath. She says her granddaughter was ill and she was in close contact with her.  She is currently taking singulair, advair, albuterol and discussed adding mucinex.  She says she is starting to feel better.   Relevant past medical, surgical, family and social history reviewed and updated as indicated. Interim medical history since our last visit reviewed. Allergies and medications reviewed and updated.  Review of Systems  Constitutional: Negative for  fever or weight change.  Respiratory: Positive for cough and negative for shortness of breath.   Cardiovascular: Negative for chest pain or palpitations.  Gastrointestinal: Negative for abdominal pain, no bowel changes.  Musculoskeletal: Negative for gait problem or joint swelling.  Skin: Negative for rash.  Neurological: Negative for dizziness or headache.  No other specific complaints in a complete review of systems (except as listed in HPI above).      Objective:    BP 124/80    Pulse 80    Temp 97.6 F (36.4 C) (Oral)    Resp 16    Ht 5\' 4"  (1.626 m)    Wt 177 lb 4.8 oz (80.4 kg)    SpO2 98%    BMI 30.43 kg/m   Wt Readings from Last 3 Encounters:  10/03/21 177 lb 4.8 oz (80.4 kg)  07/04/21 183 lb 11.2 oz (83.3 kg)  02/01/21 195 lb 6.4 oz (88.6 kg)    Physical Exam  Constitutional: Patient appears well-developed and well-nourished.  No distress.  HEENT: head atraumatic, normocephalic, pupils equal and reactive to light, ears TMs clear, neck supple, throat within normal limits Cardiovascular: Normal rate, regular rhythm and normal heart sounds.  No murmur heard. No BLE edema. Pulmonary/Chest: Effort normal and breath sounds  normal. No respiratory distress. Abdominal: Soft.  There is no tenderness. Psychiatric: Patient has a normal mood and affect. behavior is normal. Judgment and thought content normal.  Diabetic Foot Exam - Simple   Simple Foot Form Diabetic Foot exam was performed with the following findings: Yes 10/03/2021 10:40 AM  Visual Inspection No deformities, no ulcerations, no other skin breakdown bilaterally: Yes Sensation Testing Intact to touch and monofilament testing bilaterally: Yes Pulse Check Posterior Tibialis and Dorsalis pulse intact bilaterally: Yes Comments     Results for orders placed or performed in visit on 07/04/21  DRUG MONITOR, PANEL 1, W/CONF, URINE  Result Value Ref Range   Amphetamines NEGATIVE <500 ng/mL   Barbiturates NEGATIVE <300  ng/mL   Benzodiazepines NEGATIVE <100 ng/mL   Cocaine Metabolite NEGATIVE <150 ng/mL   Marijuana Metabolite NEGATIVE <20 ng/mL   Methadone Metabolite NEGATIVE <100 ng/mL   Opiates NEGATIVE CONFIRMED <100 ng/mL   Codeine NEGATIVE <50 ng/mL   Hydrocodone NEGATIVE <50 ng/mL   Hydromorphone NEGATIVE <50 ng/mL   Morphine NEGATIVE <50 ng/mL   Norhydrocodone NEGATIVE <50 ng/mL   Opiates Comments     Oxycodone POSITIVE (A) <100 ng/mL   Noroxycodone 4,535 (H) <50 ng/mL   Oxycodone 3,269 (H) <50 ng/mL   Oxymorphone 1,666 (H) <50 ng/mL   Oxycodone Comments     Phencyclidine NEGATIVE <25 ng/mL   Creatinine 155.3 > or = 20.0 mg/dL   pH 5.6 4.5 - 9.0   Oxidant NEGATIVE <200 mcg/mL  DM TEMPLATE  Result Value Ref Range   Notes and Comments        Assessment & Plan:   1. Type 2 diabetes mellitus without complication, without long-term current use of insulin (HCC) -continue current treatment - COMPLETE METABOLIC PANEL WITH GFR - Hemoglobin A1c - Microalbumin / creatinine urine ratio - HM Diabetes Foot Exam  2. Primary hypertension -continue current treatment - CBC with Differential/Platelet - COMPLETE METABOLIC PANEL WITH GFR  3. Mixed hyperlipidemia -continue current treatment - Lipid panel - COMPLETE METABOLIC PANEL WITH GFR - ezetimibe-simvastatin (VYTORIN) 10-40 MG tablet; Take 1 tablet by mouth at bedtime.  Dispense: 90 tablet; Refill: 3  4. Hypothyroidism, unspecified type -continue current treatment - TSH  5. Viral upper respiratory tract infection -add mucinex to current medications -push fluids -patient reports she is improving  6. Mild intermittent asthma without status asthmaticus with acute exacerbation -continue current treatment - montelukast (SINGULAIR) 10 MG tablet; Take 1 tablet (10 mg total) by mouth at bedtime.  Dispense: 90 tablet; Refill: 3   Follow up plan: Return in about 6 months (around 04/02/2022) for follow up.

## 2021-10-04 ENCOUNTER — Other Ambulatory Visit: Payer: Self-pay | Admitting: Nurse Practitioner

## 2021-10-04 DIAGNOSIS — E039 Hypothyroidism, unspecified: Secondary | ICD-10-CM

## 2021-10-04 DIAGNOSIS — R079 Chest pain, unspecified: Secondary | ICD-10-CM | POA: Diagnosis not present

## 2021-10-04 LAB — CBC WITH DIFFERENTIAL/PLATELET
Absolute Monocytes: 428 cells/uL (ref 200–950)
Basophils Absolute: 30 cells/uL (ref 0–200)
Basophils Relative: 0.4 %
Eosinophils Absolute: 255 cells/uL (ref 15–500)
Eosinophils Relative: 3.4 %
HCT: 38.1 % (ref 35.0–45.0)
Hemoglobin: 12.6 g/dL (ref 11.7–15.5)
Lymphs Abs: 1733 cells/uL (ref 850–3900)
MCH: 28.8 pg (ref 27.0–33.0)
MCHC: 33.1 g/dL (ref 32.0–36.0)
MCV: 87 fL (ref 80.0–100.0)
MPV: 10.7 fL (ref 7.5–12.5)
Monocytes Relative: 5.7 %
Neutro Abs: 5055 cells/uL (ref 1500–7800)
Neutrophils Relative %: 67.4 %
Platelets: 218 10*3/uL (ref 140–400)
RBC: 4.38 10*6/uL (ref 3.80–5.10)
RDW: 13.8 % (ref 11.0–15.0)
Total Lymphocyte: 23.1 %
WBC: 7.5 10*3/uL (ref 3.8–10.8)

## 2021-10-04 LAB — LIPID PANEL
Cholesterol: 130 mg/dL (ref <200)
HDL: 47 mg/dL — ABNORMAL LOW (ref >=50)
LDL Cholesterol (Calc): 62 mg/dL (calc)
Non-HDL Cholesterol (Calc): 83 mg/dL (calc) (ref <130)
Total CHOL/HDL Ratio: 2.8 (calc) (ref <5.0)
Triglycerides: 119 mg/dL (ref <150)

## 2021-10-04 LAB — COMPLETE METABOLIC PANEL WITH GFR
AG Ratio: 1.8 (calc) (ref 1.0–2.5)
ALT: 11 U/L (ref 6–29)
AST: 18 U/L (ref 10–35)
Albumin: 4.9 g/dL (ref 3.6–5.1)
Alkaline phosphatase (APISO): 44 U/L (ref 37–153)
BUN: 10 mg/dL (ref 7–25)
CO2: 24 mmol/L (ref 20–32)
Calcium: 10.2 mg/dL (ref 8.6–10.4)
Chloride: 101 mmol/L (ref 98–110)
Creat: 0.82 mg/dL (ref 0.60–1.00)
Globulin: 2.7 g/dL (calc) (ref 1.9–3.7)
Glucose, Bld: 96 mg/dL (ref 65–99)
Potassium: 4.7 mmol/L (ref 3.5–5.3)
Sodium: 137 mmol/L (ref 135–146)
Total Bilirubin: 0.5 mg/dL (ref 0.2–1.2)
Total Protein: 7.6 g/dL (ref 6.1–8.1)
eGFR: 77 mL/min/{1.73_m2} (ref >=60)

## 2021-10-04 LAB — TSH: TSH: 0.3 mIU/L — ABNORMAL LOW (ref 0.40–4.50)

## 2021-10-04 LAB — MICROALBUMIN / CREATININE URINE RATIO
Creatinine, Urine: 203 mg/dL (ref 20–275)
Microalb Creat Ratio: 7 mcg/mg creat (ref <30)
Microalb, Ur: 1.4 mg/dL

## 2021-10-04 LAB — HEMOGLOBIN A1C
Hgb A1c MFr Bld: 6 % of total Hgb — ABNORMAL HIGH (ref <5.7)
Mean Plasma Glucose: 126 mg/dL
eAG (mmol/L): 7 mmol/L

## 2021-10-04 MED ORDER — LEVOTHYROXINE SODIUM 75 MCG PO TABS: 75.0000 ug | ORAL_TABLET | Freq: Every day | ORAL | 0 refills | Status: DC

## 2021-10-05 ENCOUNTER — Encounter: Payer: Self-pay | Admitting: Nurse Practitioner

## 2021-10-06 ENCOUNTER — Other Ambulatory Visit: Payer: Self-pay | Admitting: Nurse Practitioner

## 2021-10-06 DIAGNOSIS — R053 Chronic cough: Secondary | ICD-10-CM

## 2021-10-06 MED ORDER — PREDNISONE 10 MG (21) PO TBPK: ORAL_TABLET | ORAL | 0 refills | Status: DC

## 2021-10-06 MED ORDER — BENZONATATE 100 MG PO CAPS: 200.0000 mg | ORAL_CAPSULE | Freq: Two times a day (BID) | ORAL | 0 refills | Status: DC | PRN

## 2021-11-02 DIAGNOSIS — R079 Chest pain, unspecified: Secondary | ICD-10-CM | POA: Diagnosis not present

## 2021-11-02 DIAGNOSIS — E119 Type 2 diabetes mellitus without complications: Secondary | ICD-10-CM | POA: Diagnosis not present

## 2021-11-02 DIAGNOSIS — Z981 Arthrodesis status: Secondary | ICD-10-CM | POA: Diagnosis not present

## 2021-11-02 DIAGNOSIS — M5412 Radiculopathy, cervical region: Secondary | ICD-10-CM | POA: Diagnosis not present

## 2021-11-02 DIAGNOSIS — E669 Obesity, unspecified: Secondary | ICD-10-CM | POA: Diagnosis not present

## 2021-11-02 DIAGNOSIS — E782 Mixed hyperlipidemia: Secondary | ICD-10-CM | POA: Diagnosis not present

## 2021-11-02 DIAGNOSIS — M542 Cervicalgia: Secondary | ICD-10-CM | POA: Diagnosis not present

## 2021-11-15 ENCOUNTER — Telehealth: Payer: Self-pay

## 2021-11-15 NOTE — Telephone Encounter (Signed)
Pt has been informed she can come in any day to get her labwork done from Mon-Fri 8-12PM or 2-4PM. ?

## 2021-11-15 NOTE — Telephone Encounter (Signed)
Copied from CRM [id number]. Topic: General - Other >> Nov 15, 2021  9:36 AM Gaetana Michaelis A wrote: Reason for CRM: The patient has been directed to call and request orders for their TSH to be rechecked  Please contact further when possible

## 2021-11-16 ENCOUNTER — Other Ambulatory Visit: Payer: Self-pay | Admitting: Cardiology

## 2021-11-16 DIAGNOSIS — R079 Chest pain, unspecified: Secondary | ICD-10-CM

## 2021-11-18 ENCOUNTER — Other Ambulatory Visit: Payer: Self-pay | Admitting: Nurse Practitioner

## 2021-11-18 DIAGNOSIS — E039 Hypothyroidism, unspecified: Secondary | ICD-10-CM | POA: Diagnosis not present

## 2021-11-19 LAB — TSH: TSH: 1.77 mIU/L (ref 0.40–4.50)

## 2021-11-21 ENCOUNTER — Other Ambulatory Visit: Payer: Self-pay | Admitting: Nurse Practitioner

## 2021-11-21 DIAGNOSIS — E039 Hypothyroidism, unspecified: Secondary | ICD-10-CM

## 2021-11-21 NOTE — Telephone Encounter (Signed)
Requested Prescriptions  ?Pending Prescriptions Disp Refills  ?? levothyroxine (SYNTHROID) 75 MCG tablet [Pharmacy Med Name: LEVOTHYROXINE 75 MCG TABLET] 90 tablet 1  ?  Sig: Take 1 tablet (75 mcg total) by mouth daily before breakfast.  ?  ? Endocrinology:  Hypothyroid Agents Passed - 11/21/2021  9:36 AM  ?  ?  Passed - TSH in normal range and within 360 days  ?  TSH  ?Date Value Ref Range Status  ?11/18/2021 1.77 0.40 - 4.50 mIU/L Final  ?   ?  ?  Passed - Valid encounter within last 12 months  ?  Recent Outpatient Visits   ?      ? 1 month ago Type 2 diabetes mellitus without complication, without long-term current use of insulin (HCC)  ? [facility] Della Goo F, FNP  ? 4 months ago Mild intermittent asthma with exacerbation  ? [facility] [place], Danna Hefty, MD  ? 4 months ago Encounter for monitoring opioid maintenance therapy  ? [facility] [place], Sheliah Mends, [place]  ? 9 months ago IFG (impaired fasting glucose)  ? [facility] [place] M, DO  ? 1 year ago Fibromyalgia  ? [facility] Danelle Berry, [place]  ?  ?  ?Future Appointments   ?        ? In 1 month Zane Herald, Rudolpho Sevin, FNP [facility]  ? In 9 months  [facility], [facility]  ?  ? ?  ?  ?  ? ? ?

## 2021-11-22 ENCOUNTER — Ambulatory Visit
Admission: RE | Admit: 2021-11-22 | Discharge: 2021-11-22 | Disposition: A | Discharge status: Home / Self Care | Payer: Medicare PPO | Source: Ambulatory Visit | Attending: Family Medicine | Admitting: Family Medicine

## 2021-11-22 DIAGNOSIS — Z1231 Encounter for screening mammogram for malignant neoplasm of breast: Secondary | ICD-10-CM | POA: Diagnosis not present

## 2021-12-19 DIAGNOSIS — G8929 Other chronic pain: Secondary | ICD-10-CM | POA: Diagnosis not present

## 2021-12-19 DIAGNOSIS — M542 Cervicalgia: Secondary | ICD-10-CM | POA: Diagnosis not present

## 2021-12-19 DIAGNOSIS — Z981 Arthrodesis status: Secondary | ICD-10-CM | POA: Diagnosis not present

## 2021-12-19 DIAGNOSIS — Z79891 Long term (current) use of opiate analgesic: Secondary | ICD-10-CM | POA: Diagnosis not present

## 2021-12-19 DIAGNOSIS — G8928 Other chronic postprocedural pain: Secondary | ICD-10-CM | POA: Diagnosis not present

## 2021-12-19 DIAGNOSIS — Z5181 Encounter for therapeutic drug level monitoring: Secondary | ICD-10-CM | POA: Diagnosis not present

## 2021-12-30 ENCOUNTER — Ambulatory Visit: Payer: Medicare PPO | Admitting: Nurse Practitioner

## 2021-12-30 ENCOUNTER — Encounter: Payer: Self-pay | Admitting: Nurse Practitioner

## 2021-12-30 VITALS — BP 132/78 | HR 97 | Temp 97.7°F | Resp 16 | Ht 64.0 in | Wt 179.0 lb

## 2021-12-30 DIAGNOSIS — E782 Mixed hyperlipidemia: Secondary | ICD-10-CM | POA: Diagnosis not present

## 2021-12-30 DIAGNOSIS — E1169 Type 2 diabetes mellitus with other specified complication: Secondary | ICD-10-CM

## 2021-12-30 DIAGNOSIS — J4520 Mild intermittent asthma, uncomplicated: Secondary | ICD-10-CM

## 2021-12-30 DIAGNOSIS — I10 Essential (primary) hypertension: Secondary | ICD-10-CM | POA: Diagnosis not present

## 2021-12-30 DIAGNOSIS — E785 Hyperlipidemia, unspecified: Secondary | ICD-10-CM

## 2021-12-30 DIAGNOSIS — G35D Multiple sclerosis, unspecified: Secondary | ICD-10-CM

## 2021-12-30 DIAGNOSIS — E039 Hypothyroidism, unspecified: Secondary | ICD-10-CM | POA: Diagnosis not present

## 2021-12-30 DIAGNOSIS — M5412 Radiculopathy, cervical region: Secondary | ICD-10-CM | POA: Diagnosis not present

## 2021-12-30 DIAGNOSIS — F3342 Major depressive disorder, recurrent, in full remission: Secondary | ICD-10-CM

## 2021-12-30 DIAGNOSIS — G35 Multiple sclerosis: Secondary | ICD-10-CM

## 2021-12-30 DIAGNOSIS — E119 Type 2 diabetes mellitus without complications: Secondary | ICD-10-CM

## 2021-12-30 MED ORDER — TIZANIDINE HCL 4 MG PO TABS: 8.0000 mg | ORAL_TABLET | Freq: Three times a day (TID) | ORAL | 1 refills | Status: AC

## 2021-12-30 NOTE — Assessment & Plan Note (Signed)
>>  ASSESSMENT AND PLAN FOR PREDIABETES WRITTEN ON 12/30/2021  9:41 AM BY Wendal Wilkie F, FNP  Continue taking metformin  500 mg 2 times a day.  A1c is at goal.

## 2021-12-30 NOTE — Assessment & Plan Note (Signed)
Continue taking Singulair and Advair.  Use albuterol inhaler as needed.

## 2021-12-30 NOTE — Assessment & Plan Note (Signed)
Blood pressure at goal.  Continue taking metoprolol 50 mg at bedtime and spironolactone 50 mg in the morning.

## 2021-12-30 NOTE — Assessment & Plan Note (Signed)
She reports that her depression is well controlled.  She is currently taking Cymbalta 40 mg in the morning and 20 mg at night.

## 2021-12-30 NOTE — Assessment & Plan Note (Signed)
Currently taking levothyroxine 88 mcg daily.  She says the only symptom she has noticed that she has had some weight gain.

## 2021-12-30 NOTE — Assessment & Plan Note (Signed)
Last LDL was 62 on 10/03/2021 continue taking Vytorin 10-40 mg daily.

## 2021-12-30 NOTE — Assessment & Plan Note (Signed)
Currently seeing pain management at [facility].

## 2021-12-30 NOTE — Assessment & Plan Note (Signed)
She says she feels like her MS is in remission.  She has had no recent issues.

## 2021-12-30 NOTE — Assessment & Plan Note (Signed)
Continue taking metformin 500 mg 2 times a day.  A1c is at goal.

## 2021-12-30 NOTE — Progress Notes (Signed)
BP 132/78   Pulse 97   Temp 97.7 F (36.5 C)   Resp 16   Ht 5\' 4"  (1.626 m)   Wt 179 lb (81.2 kg)   SpO2 91%   BMI 30.73 kg/m    Subjective:    Patient ID: , female    DOB: March 22, 1951, 71 y.o.   MRN: [id number]  HPI: Brittany Hill is a 71 y.o. female  Chief Complaint  Patient presents with   Follow-up   Type 2 diabetes: Her last A1c was 6.0 on 10/03/2021.  She is currently taking metformin 500 mg 2 times a day.  She denies any polyuria, polyphagia, or polydipsia.  She does not check her blood sugar at home.  She is up-to-date on her foot exam and her urine microalbumin.  Hypertension: Her blood pressure today is 132/78.  She denies any chest pain, shortness of breath, blurred vision or headaches.  She is currently taking metoprolol 50 mg at bedtime and spironolactone 50 mg in the morning.  Hyperlipidemia: Her last LDL was 62 on 10/03/2021.  She is currently taking Vytorin 10-40 mg daily.  Patient denies any myalgia.  Hypothyroidism: Her last TSH was 1.77 on November 18, 2021.  She is currently taking levothyroxine 75 mcg daily.  She denies any palpitations,changes in skin or hair.  She reports that she has had issues with constipation but she is also on narcotics for her neck pain.  She says she has noticed some weight gain.  We will get labs today.  Asthma: She denies any shortness of breath.  She currently takes Singulair 10 mg at bedtime, Advair, and albuterol inhaler as needed. She says she has not had to use her albuterol inhaler in a while.   Depression:She is currently taking cymbalta 40 mg in the am and 20 mg at night. She says her mood is doing well.     12/30/2021    9:14 AM 12/30/2021    9:08 AM 10/03/2021   10:37 AM 09/13/2021    3:40 PM 07/12/2021    9:48 AM  Depression screen PHQ 2/9  Decreased Interest 0 0 0 0 0  Down, Depressed, Hopeless 1 1 0 0 0  PHQ - 2 Score 1 1 0 0 0  Altered sleeping 0    0  Tired, decreased energy 0    0  Change in  appetite 0    0  Feeling bad or failure about yourself  0    0  Trouble concentrating 0    0  Moving slowly or fidgety/restless 0    0  Suicidal thoughts 0      PHQ-9 Score 1    0  Difficult doing work/chores Not difficult at all    Not difficult at all    MS: She says that she has leg weakness in the mornings. She says she has not had any falls.  She says she has been doing well. She has not seen a neurologist for a long time.  She says she feels like her MS is in remission.   Neck pain: She said she had neck surgery August of last year.  She says she was told to take about a year to recover.  She is currently seeing pain management at [facility] pain medicine.  She says they have increased her tizanidine to 3 times a day.  She is not sure if they were sending in the prescription but we will send in a refill  for her.   Relevant past medical, surgical, family and social history reviewed and updated as indicated. Interim medical history since our last visit reviewed. Allergies and medications reviewed and updated.  Review of Systems  Constitutional: Negative for fever or weight change.  Respiratory: Negative for cough and shortness of breath.   Cardiovascular: Negative for chest pain or palpitations.  Gastrointestinal: Negative for abdominal pain, no bowel changes.  Musculoskeletal: Negative for gait problem or joint swelling.  Positive for neck pain Skin: Negative for rash.  Neurological: Negative for dizziness or headache.  No other specific complaints in a complete review of systems (except as listed in HPI above).      Objective:    BP 132/78   Pulse 97   Temp 97.7 F (36.5 C)   Resp 16   Ht 5\' 4"  (1.626 m)   Wt 179 lb (81.2 kg)   SpO2 91%   BMI 30.73 kg/m   Wt Readings from Last 3 Encounters:  12/30/21 179 lb (81.2 kg)  10/03/21 177 lb 4.8 oz (80.4 kg)  07/04/21 183 lb 11.2 oz (83.3 kg)    Physical Exam  Constitutional: Patient appears well-developed and well-nourished. No  distress.  HEENT: head atraumatic, normocephalic, pupils equal and reactive to light, neck supple, throat within normal limits Cardiovascular: Normal rate, regular rhythm and normal heart sounds.  No murmur heard. No BLE edema. Pulmonary/Chest: Effort normal and breath sounds normal. No respiratory distress. Abdominal: Soft.  There is no tenderness. Psychiatric: Patient has a normal mood and affect. behavior is normal. Judgment and thought content normal.  Results for orders placed or performed in visit on 11/18/21  TSH  Result Value Ref Range   TSH 1.77 0.40 - 4.50 mIU/L      Assessment & Plan:   Problem List Items Addressed This Visit       Cardiovascular and Mediastinum   Hypertension (Chronic)    Blood pressure at goal.  Continue taking metoprolol 50 mg at bedtime and spironolactone 50 mg in the morning.       Relevant Orders   CBC with Differential/Platelet   COMPLETE METABOLIC PANEL WITH GFR     Respiratory   Asthma without status asthmaticus    Continue taking Singulair and Advair.  Use albuterol inhaler as needed.         Endocrine   Hypothyroidism (Chronic)    Currently taking levothyroxine 88 mcg daily.  She says the only symptom she has noticed that she has had some weight gain.       Relevant Orders   TSH   Type 2 diabetes mellitus without complication, without long-term current use of insulin (HCC) - Primary    Continue taking metformin 500 mg 2 times a day.  A1c is at goal.       Relevant Orders   Hemoglobin A1c   Dyslipidemia associated with type 2 diabetes mellitus (HCC)    Last LDL was 62 on 10/03/2021 continue taking Vytorin 10-40 mg daily.         Nervous and Auditory   MS (multiple sclerosis) (HCC) (Chronic)    She says she feels like her MS is in remission.  She has had no recent issues.       Cervical radiculopathy    Currently seeing pain management at [facility].       Relevant Medications   tiZANidine (ZANAFLEX) 4 MG tablet      Other   Hyperlipidemia (Chronic)   MDD (major depressive disorder),  recurrent, in full remission [facility])    She reports that her depression is well controlled.  She is currently taking Cymbalta 40 mg in the morning and 20 mg at night.         Follow up plan: Return in about 4 months (around 05/02/2022) for follow up.

## 2021-12-31 LAB — COMPLETE METABOLIC PANEL WITH GFR
AG Ratio: 1.8 (calc) (ref 1.0–2.5)
ALT: 9 U/L (ref 6–29)
AST: 18 U/L (ref 10–35)
Albumin: 4.3 g/dL (ref 3.6–5.1)
Alkaline phosphatase (APISO): 35 U/L — ABNORMAL LOW (ref 37–153)
BUN: 9 mg/dL (ref 7–25)
CO2: 27 mmol/L (ref 20–32)
Calcium: 9.7 mg/dL (ref 8.6–10.4)
Chloride: 104 mmol/L (ref 98–110)
Creat: 0.88 mg/dL (ref 0.60–1.00)
Globulin: 2.4 g/dL (calc) (ref 1.9–3.7)
Glucose, Bld: 85 mg/dL (ref 65–99)
Potassium: 4.2 mmol/L (ref 3.5–5.3)
Sodium: 138 mmol/L (ref 135–146)
Total Bilirubin: 0.3 mg/dL (ref 0.2–1.2)
Total Protein: 6.7 g/dL (ref 6.1–8.1)
eGFR: 71 mL/min/{1.73_m2} (ref >=60)

## 2021-12-31 LAB — CBC WITH DIFFERENTIAL/PLATELET
Absolute Monocytes: 461 cells/uL (ref 200–950)
Basophils Absolute: 21 cells/uL (ref 0–200)
Basophils Relative: 0.4 %
Eosinophils Absolute: 191 cells/uL (ref 15–500)
Eosinophils Relative: 3.6 %
HCT: 33.5 % — ABNORMAL LOW (ref 35.0–45.0)
Hemoglobin: 11.3 g/dL — ABNORMAL LOW (ref 11.7–15.5)
Lymphs Abs: 1940 cells/uL (ref 850–3900)
MCH: 30.3 pg (ref 27.0–33.0)
MCHC: 33.7 g/dL (ref 32.0–36.0)
MCV: 89.8 fL (ref 80.0–100.0)
MPV: 10.6 fL (ref 7.5–12.5)
Monocytes Relative: 8.7 %
Neutro Abs: 2687 cells/uL (ref 1500–7800)
Neutrophils Relative %: 50.7 %
Platelets: 158 10*3/uL (ref 140–400)
RBC: 3.73 10*6/uL — ABNORMAL LOW (ref 3.80–5.10)
RDW: 12.9 % (ref 11.0–15.0)
Total Lymphocyte: 36.6 %
WBC: 5.3 10*3/uL (ref 3.8–10.8)

## 2021-12-31 LAB — TSH: TSH: 3.38 mIU/L (ref 0.40–4.50)

## 2021-12-31 LAB — HEMOGLOBIN A1C
Hgb A1c MFr Bld: 5.9 % of total Hgb — ABNORMAL HIGH (ref <5.7)
Mean Plasma Glucose: 123 mg/dL
eAG (mmol/L): 6.8 mmol/L

## 2022-01-03 DIAGNOSIS — H04543 Stenosis of bilateral lacrimal canaliculi: Secondary | ICD-10-CM | POA: Diagnosis not present

## 2022-01-06 DIAGNOSIS — I2510 Atherosclerotic heart disease of native coronary artery without angina pectoris: Secondary | ICD-10-CM | POA: Diagnosis not present

## 2022-01-06 DIAGNOSIS — E119 Type 2 diabetes mellitus without complications: Secondary | ICD-10-CM | POA: Diagnosis not present

## 2022-01-06 DIAGNOSIS — R0609 Other forms of dyspnea: Secondary | ICD-10-CM | POA: Diagnosis not present

## 2022-01-06 DIAGNOSIS — R0789 Other chest pain: Secondary | ICD-10-CM | POA: Diagnosis not present

## 2022-01-06 DIAGNOSIS — R079 Chest pain, unspecified: Secondary | ICD-10-CM | POA: Diagnosis not present

## 2022-01-06 DIAGNOSIS — I10 Essential (primary) hypertension: Secondary | ICD-10-CM | POA: Diagnosis not present

## 2022-01-06 DIAGNOSIS — E785 Hyperlipidemia, unspecified: Secondary | ICD-10-CM | POA: Diagnosis not present

## 2022-01-17 ENCOUNTER — Ambulatory Visit: Payer: Self-pay

## 2022-01-17 ENCOUNTER — Other Ambulatory Visit: Payer: Self-pay

## 2022-01-17 ENCOUNTER — Ambulatory Visit: Payer: Medicare PPO | Admitting: Family Medicine

## 2022-01-17 ENCOUNTER — Encounter: Payer: Self-pay | Admitting: Family Medicine

## 2022-01-17 VITALS — BP 130/84 | HR 71 | Temp 97.8°F | Resp 16 | Ht 64.0 in | Wt 176.2 lb

## 2022-01-17 DIAGNOSIS — M542 Cervicalgia: Secondary | ICD-10-CM

## 2022-01-17 DIAGNOSIS — R55 Syncope and collapse: Secondary | ICD-10-CM | POA: Diagnosis not present

## 2022-01-17 MED ORDER — SPIRONOLACTONE 25 MG PO TABS: 25.0000 mg | ORAL_TABLET | Freq: Every day | ORAL | 0 refills | Status: DC

## 2022-01-17 NOTE — Progress Notes (Signed)
.  fall

## 2022-01-17 NOTE — Progress Notes (Signed)
   SUBJECTIVE:   CHIEF COMPLAINT / HPI:   SYNCOPE On Sunday, had episode where she passed out in the bathroom. She stood from using the bathroom, had some blurry vision, felt funny, stood for a second then walked a few feet before passing out. While falling, she hit a fan and clothes basket, falling on her L side and neck. Fall was unwitnessed. States when she woke up she did not notice any loss of continence or tongue biting. This has never happened before but does note sometimes her blood pressure lowers significantly when she takes her oxycodone and tizanidine. Denies having chest pain, shortness of breath, sweating. Does remember having nausea and lightheadedness earlier that morning.  Reports L hand and hip pain are improving. No swelling or redness. Able to do all of regular activities, walk without issue. Has had multiple neck surgeries with hardware in cervical spine.  Saw eye doc 3 weeks ago with normal exam.   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 Next appt 01/30/22.   OBJECTIVE:   BP 130/84   Pulse 71   Temp 97.8 F (36.6 C) (Oral)   Resp 16   Ht 5\' 4"  (1.626 m)   Wt 176 lb 3.2 oz (79.9 kg)   SpO2 99%   BMI 30.24 kg/m   Gen: well appearing, in NAD Card: RRR Lungs: CTAB Ext: WWP, no edema   ASSESSMENT/PLAN:   Syncope Likely orthostatic given prodromal symptoms, also with positive orthostatic vitals today. EKG NSR today, also with previous reassuring cardiac workup, unlikely arrhythmia however will check TSH to ensure euthyroid. Will decrease spironolactone to 25 mg. Coming next week for BP check. If remains symptomatic with BP less tightly controlled, may consider heart monitor, however has cardiology follow up in 2 weeks, will defer to them. Also on chronic narcotics and muscle relaxer,  possibly contributed to lower BP, recommend using sparingly especially given age. Obtaining labs to assess for other contributors. F/u next week for BP check and 4 weeks for f/u.  Neck pain With cervical radiculopathy s/p fusion with recent trauma/fall. Will obtain Cspine XR to ensure appropriate placement of hardware. ROM at baseline. Neurovascularly intact.     Myles Gip, DO

## 2022-01-17 NOTE — Telephone Encounter (Signed)
     Chief Complaint: Larey Seat at home Symptoms: Hands and left hip are sore Frequency: Sunday Pertinent Negatives: Patient denies any lacerations Disposition: [] ED /[] Urgent Care (no appt availability in office) / [x] Appointment(In office/virtual)/ []  [place] [facility]/ [] Home Care/ [] Refused Recommended Disposition /[] [place] [facility]/ []  Follow-up with PCP Additional Notes:   Reason for Disposition  MILD weakness (i.e., does not interfere with ability to work, go to school, normal activities) (Exception: mild weakness is a chronic symptom)  Answer Assessment - Initial Assessment Questions 1. MECHANISM: "How did the fall happen?"     Fell at home, was dizzy 2. DOMESTIC VIOLENCE AND ELDER ABUSE SCREENING: "Did you fall because someone pushed you or tried to hurt you?" If Yes, ask: "Are you safe now?"     No 3. ONSET: "When did the fall happen?" (e.g., minutes, hours, or days ago)     Sunday 4. LOCATION: "What part of the body hit the ground?" (e.g., back, buttocks, head, hips, knees, hands, head, stomach)     Fell on hands 5. INJURY: "Did you hurt (injure) yourself when you fell?" If Yes, ask: "What did you injure? Tell me more about this?" (e.g., body area; type of injury; pain severity)"     Hands are sore, left hip 6. PAIN: "Is there any pain?" If Yes, ask: "How bad is the pain?" (e.g., Scale 1-10; or mild,  moderate, severe)   - NONE (0): No pain   - MILD (1-3): Doesn't interfere with normal activities    - MODERATE (4-7): Interferes with normal activities or awakens from sleep    - SEVERE (8-10): Excruciating pain, unable to do any normal activities      Mild 7. SIZE: For cuts, bruises, or swelling, ask: "How large is it?" (e.g., inches or centimeters)      No 8. PREGNANCY: "Is there any chance you are pregnant?" "When was your last menstrual period?"     No 9. OTHER SYMPTOMS: "Do you have any other symptoms?" (e.g., dizziness, fever, weakness; new onset or  worsening).      Maybe dizzy 10. CAUSE: "What do you think caused the fall (or falling)?" (e.g., tripped, dizzy spell)       Dizzy  Protocols used: Falls and [facility]

## 2022-01-17 NOTE — Assessment & Plan Note (Signed)
With cervical radiculopathy s/p fusion with recent trauma/fall. Will obtain Cspine XR to ensure appropriate placement of hardware. ROM at baseline. Neurovascularly intact.

## 2022-01-17 NOTE — Assessment & Plan Note (Addendum)
Likely orthostatic given prodromal symptoms, also with positive orthostatic vitals today. EKG NSR today, also with previous reassuring cardiac workup, unlikely arrhythmia however will check TSH to ensure euthyroid. Will decrease spironolactone to 25 mg. Coming next week for BP check. If remains symptomatic with BP less tightly controlled, may consider heart monitor, however has cardiology follow up in 2 weeks, will defer to them. Also on chronic narcotics and muscle relaxer, possibly contributed to lower BP, recommend using sparingly especially given age. Obtaining labs to assess for other contributors. F/u next week for BP check and 4 weeks for f/u.

## 2022-01-18 ENCOUNTER — Telehealth: Payer: Self-pay | Admitting: Emergency Medicine

## 2022-01-18 LAB — CBC WITH DIFFERENTIAL/PLATELET
Absolute Monocytes: 418 cells/uL (ref 200–950)
Basophils Absolute: 10 cells/uL (ref 0–200)
Basophils Relative: 0.2 %
Eosinophils Absolute: 102 cells/uL (ref 15–500)
Eosinophils Relative: 2 %
HCT: 35.6 % (ref 35.0–45.0)
Hemoglobin: 12 g/dL (ref 11.7–15.5)
Lymphs Abs: 1872 cells/uL (ref 850–3900)
MCH: 30.2 pg (ref 27.0–33.0)
MCHC: 33.7 g/dL (ref 32.0–36.0)
MCV: 89.7 fL (ref 80.0–100.0)
MPV: 10.4 fL (ref 7.5–12.5)
Monocytes Relative: 8.2 %
Neutro Abs: 2698 cells/uL (ref 1500–7800)
Neutrophils Relative %: 52.9 %
Platelets: 216 10*3/uL (ref 140–400)
RBC: 3.97 10*6/uL (ref 3.80–5.10)
RDW: 12.6 % (ref 11.0–15.0)
Total Lymphocyte: 36.7 %
WBC: 5.1 10*3/uL (ref 3.8–10.8)

## 2022-01-18 LAB — COMPREHENSIVE METABOLIC PANEL
AG Ratio: 1.7 (calc) (ref 1.0–2.5)
ALT: 14 U/L (ref 6–29)
AST: 24 U/L (ref 10–35)
Albumin: 4.6 g/dL (ref 3.6–5.1)
Alkaline phosphatase (APISO): 36 U/L — ABNORMAL LOW (ref 37–153)
BUN/Creatinine Ratio: 11 (calc) (ref 6–22)
BUN: 11 mg/dL (ref 7–25)
CO2: 25 mmol/L (ref 20–32)
Calcium: 10 mg/dL (ref 8.6–10.4)
Chloride: 103 mmol/L (ref 98–110)
Creat: 1.03 mg/dL — ABNORMAL HIGH (ref 0.60–1.00)
Globulin: 2.7 g/dL (calc) (ref 1.9–3.7)
Glucose, Bld: 97 mg/dL (ref 65–99)
Potassium: 4.5 mmol/L (ref 3.5–5.3)
Sodium: 137 mmol/L (ref 135–146)
Total Bilirubin: 0.4 mg/dL (ref 0.2–1.2)
Total Protein: 7.3 g/dL (ref 6.1–8.1)

## 2022-01-18 LAB — TSH: TSH: 2.53 mIU/L (ref 0.40–4.50)

## 2022-01-18 NOTE — Telephone Encounter (Signed)
Patient is going for xray of neck today and would like to know if you can add on left hand and wrist also. Having numbness and pain.

## 2022-01-18 NOTE — Telephone Encounter (Signed)
Pt is requesting a call back.  Please advise if NT can give message to pt .

## 2022-01-19 ENCOUNTER — Ambulatory Visit
Admission: RE | Admit: 2022-01-19 | Discharge: 2022-01-19 | Disposition: A | Discharge status: Home / Self Care | Payer: Medicare PPO | Attending: Family Medicine | Admitting: Family Medicine

## 2022-01-19 ENCOUNTER — Ambulatory Visit
Admission: RE | Admit: 2022-01-19 | Discharge: 2022-01-19 | Disposition: A | Discharge status: Home / Self Care | Payer: Medicare PPO | Source: Ambulatory Visit | Attending: Family Medicine | Admitting: Family Medicine

## 2022-01-19 DIAGNOSIS — M542 Cervicalgia: Secondary | ICD-10-CM | POA: Insufficient documentation

## 2022-01-19 DIAGNOSIS — M1812 Unilateral primary osteoarthritis of first carpometacarpal joint, left hand: Secondary | ICD-10-CM | POA: Diagnosis not present

## 2022-01-19 DIAGNOSIS — M25542 Pain in joints of left hand: Secondary | ICD-10-CM | POA: Diagnosis not present

## 2022-01-19 DIAGNOSIS — M654 Radial styloid tenosynovitis [de Quervain]: Secondary | ICD-10-CM | POA: Diagnosis not present

## 2022-01-19 DIAGNOSIS — M19042 Primary osteoarthritis, left hand: Secondary | ICD-10-CM | POA: Diagnosis not present

## 2022-01-19 NOTE — Telephone Encounter (Signed)
Called pt back and relayed Dr.Rumball message. Pt verbalized understanding.

## 2022-01-19 NOTE — Telephone Encounter (Signed)
Pt called back about getting xray added to get along for her hand also. Is it ok for NT to relay message in chart or can pec do it. Please all back

## 2022-01-20 ENCOUNTER — Encounter: Payer: Self-pay | Admitting: Family Medicine

## 2022-01-23 ENCOUNTER — Other Ambulatory Visit: Payer: Self-pay | Admitting: Family Medicine

## 2022-01-23 DIAGNOSIS — K219 Gastro-esophageal reflux disease without esophagitis: Secondary | ICD-10-CM

## 2022-01-25 ENCOUNTER — Encounter: Payer: Self-pay | Admitting: Family Medicine

## 2022-01-31 DIAGNOSIS — I10 Essential (primary) hypertension: Secondary | ICD-10-CM | POA: Diagnosis not present

## 2022-01-31 DIAGNOSIS — R079 Chest pain, unspecified: Secondary | ICD-10-CM | POA: Diagnosis not present

## 2022-01-31 DIAGNOSIS — E782 Mixed hyperlipidemia: Secondary | ICD-10-CM | POA: Diagnosis not present

## 2022-01-31 DIAGNOSIS — E119 Type 2 diabetes mellitus without complications: Secondary | ICD-10-CM | POA: Diagnosis not present

## 2022-02-16 ENCOUNTER — Encounter: Payer: Self-pay | Admitting: Family Medicine

## 2022-02-16 ENCOUNTER — Ambulatory Visit: Payer: Medicare PPO | Admitting: Family Medicine

## 2022-02-16 VITALS — BP 104/58 | HR 65 | Temp 97.4°F | Resp 18 | Ht 64.0 in | Wt 183.0 lb

## 2022-02-16 DIAGNOSIS — R55 Syncope and collapse: Secondary | ICD-10-CM

## 2022-02-16 DIAGNOSIS — Z9189 Other specified personal risk factors, not elsewhere classified: Secondary | ICD-10-CM

## 2022-02-16 DIAGNOSIS — I10 Essential (primary) hypertension: Secondary | ICD-10-CM | POA: Diagnosis not present

## 2022-02-16 DIAGNOSIS — Z23 Encounter for immunization: Secondary | ICD-10-CM | POA: Diagnosis not present

## 2022-02-16 DIAGNOSIS — J310 Chronic rhinitis: Secondary | ICD-10-CM | POA: Diagnosis not present

## 2022-02-16 DIAGNOSIS — Z79899 Other long term (current) drug therapy: Secondary | ICD-10-CM | POA: Diagnosis not present

## 2022-02-16 MED ORDER — SHINGRIX 50 MCG/0.5ML IM SUSR: 0.5000 mL | Freq: Once | INTRAMUSCULAR | 0 refills | Status: AC

## 2022-02-16 MED ORDER — IPRATROPIUM BROMIDE 0.03 % NA SOLN: 2.0000 | Freq: Two times a day (BID) | NASAL | 12 refills | Status: DC

## 2022-02-16 NOTE — Progress Notes (Signed)
Patient ID: Brittany Hill, female    DOB: 01/31/1951, 71 y.o.   MRN: [id number]  PCP: Danelle Berry, PA-C  Chief Complaint  Patient presents with   Follow-up    Pt states has not been feeling dizzy anymore.   Clear Mucus    Pt has had clear mucus running over months is wondering if it has to do with her age.    Subjective:   Brittany Hill is a 71 y.o. female, presents to clinic with CC of the following:  HPI  Pt was seen by Dr. Linwood Dibbles on 6/13 for syncope and she presents today for requested 4 week f/up on syncope  in the interim has been seen again by cardiology Syncope was thought to be secondary to postural hypotension and polypharmacy  Spironolactone dose was decreased on 6/13 and then currently she is not taking (?) Cardiology visit on 6/27 with Dr. Lady Gary for continued eval/mgmt of CP, HTN, DM  71 y.o. female with  ICD-10-CM ICD-9-CM  1. Chest heaviness-functional study done in February did not show high-grade ischemia although her symptoms persist and are somewhat concerning for angina. ET of the coronaries revealed no significant disease with a calcium score of 0. We will continue to optimize risk factors. R07.89 786.59  2. Mixed hyperlipidemia continue with ezetimibe-simvastatin with a LDL goal of less than 100. Low-fat diet is also recommended. Continue with current regimen. E78.2 272.2  3. Essential hypertension-we will continue spironolactone and follow her pressure. We will continue with metoprolol for now have her check her pressure when she feels good at home to guide further therapy. I10 401.9  4. Type 2 diabetes mellitus without complication, unspecified whether long term insulin use (CMS-HCC) E11.9 250.00   Return in about 7 months (around 09/02/2022).  HTN managed with spironolactone 25 mg and metoprolol XL 50  BP Readings from Last 3 Encounters:  02/16/22 (!) 104/58  01/17/22 130/84  12/30/21 132/78   Pulse Readings from Last 3 Encounters:  02/16/22  65  01/17/22 71  12/30/21 97   Last vitals at cardiology were 110/70 and HR 71 She states cardiology said to decrease spironolactone if BP <110-120 and then d/c if still low So she reports not being on the meds currently and BP is still lower  She is concerned about chronic runny nose - mostly clear, no improvement with OTC allergy meds and nasal sprays     Patient Active Problem List   Diagnosis Date Noted   Syncope 01/17/2022   Dyslipidemia associated with type 2 diabetes mellitus (HCC) 07/12/2021   Acquired stenosis of bilateral nasolacrimal duct 02/08/2021   Type 2 diabetes mellitus without complication, without long-term current use of insulin (HCC) 03/22/2020   Acquired trigger finger 01/06/2020   Bouchard's nodes (with arthropathy) 01/06/2020   Chronic post-operative pain 09/05/2019   Neck pain 09/05/2019   Cervical radiculopathy 09/05/2019   Displacement of cervical intervertebral disc 05/14/2019   Fatty liver disease, nonalcoholic 03/28/2019   Osteopenia 10/20/2018   Obesity (BMI 30.0-34.9) 09/27/2018   Cataract, left 04/02/2018   Osteoarthritis of left ankle 02/16/2018   Asthma without status asthmaticus 10/05/2017   Genetic testing 08/28/2017   Family hx-breast malignancy 08/02/2017   Dense breast tissue on mammogram 07/28/2016   Left sided sciatica 12/19/2015   Left lumbar radiculopathy 12/16/2015   Hypomagnesemia 12/06/2015   Hypokalemia 12/02/2015   Fibromyalgia    MS (multiple sclerosis) (HCC)    Chronic constipation    OA (osteoarthritis)  Hypothyroidism    IFG (impaired fasting glucose)    Hypertension    Hyperlipidemia    GERD (gastroesophageal reflux disease)    MDD (major depressive disorder), recurrent, in full remission (HCC)    Anxiety    TMJ syndrome       Current Outpatient Medications:    acetaminophen (TYLENOL) 500 MG tablet, Take 1,000 mg by mouth every 6 (six) hours as needed., Disp: , Rfl:    albuterol (VENTOLIN HFA) 108 (90  Base) MCG/ACT inhaler, Inhale 2 puffs into the lungs every 6 (six) hours as needed for wheezing or shortness of breath., Disp: 18 g, Rfl: 0   calcium carbonate (OS-CAL) 600 MG TABS tablet, Take 600 mg by mouth as needed. , Disp: , Rfl:    cholecalciferol (VITAMIN D3) 25 MCG (1000 UNIT) tablet, Take 1,000 Units by mouth daily., Disp: , Rfl:    DULoxetine (CYMBALTA) 20 MG capsule, Take 2 tabs (40 mg) am and 1 tab (20 mg) as needed daily, Disp: 270 capsule, Rfl: 1   EPINEPHrine 0.3 mg/0.3 mL IJ SOAJ injection, Inject 0.3 mg into the muscle once. , Disp: , Rfl:    ezetimibe-simvastatin (VYTORIN) 10-40 MG tablet, Take 1 tablet by mouth at bedtime., Disp: 90 tablet, Rfl: 3   famotidine (PEPCID) 20 MG tablet, Take 1 tablet (20 mg total) by mouth 2 (two) times daily as needed for heartburn or indigestion., Disp: 60 tablet, Rfl: 5   fluticasone-salmeterol (ADVAIR) 100-50 MCG/ACT AEPB, Inhale 1 puff into the lungs 2 (two) times daily., Disp: 1 each, Rfl: 1   levothyroxine (SYNTHROID) 75 MCG tablet, Take 1 tablet (75 mcg total) by mouth daily before breakfast., Disp: 90 tablet, Rfl: 1   meloxicam (MOBIC) 15 MG tablet, Take 1 tablet (15 mg total) by mouth daily., Disp: 90 tablet, Rfl: 0   metFORMIN (GLUCOPHAGE-XR) 500 MG 24 hr tablet, Take 1 tablet (500 mg total) by mouth 2 (two) times daily., Disp: 180 tablet, Rfl: 3   metoprolol succinate (TOPROL-XL) 50 MG 24 hr tablet, Take 1 tablet (50 mg total) by mouth at bedtime. Take with or immediately following a meal., Disp: 90 tablet, Rfl: 3   montelukast (SINGULAIR) 10 MG tablet, Take 1 tablet (10 mg total) by mouth at bedtime., Disp: 90 tablet, Rfl: 3   naloxone (NARCAN) nasal spray 4 mg/0.1 mL, Place into the nose., Disp: , Rfl:    ondansetron (ZOFRAN ODT) 4 MG disintegrating tablet, Take 1 tablet (4 mg total) by mouth every 8 (eight) hours as needed for nausea or vomiting., Disp: 20 tablet, Rfl: 1   oxycodone (OXY-IR) 5 MG capsule, Take 5 mg by mouth every 6 (six)  hours as needed., Disp: , Rfl:    pantoprazole (PROTONIX) 40 MG tablet, Take 1 tablet (40 mg total) by mouth at bedtime., Disp: 90 tablet, Rfl: 0   pregabalin (LYRICA) 100 MG capsule, Take 100 mg po qam, 100 mg po q midday and 200 mg po at bedtime for acute on chronic neck pain, postop pain, Disp: 360 capsule, Rfl: 3   tiZANidine (ZANAFLEX) 4 MG tablet, Take 2 tablets (8 mg total) by mouth 3 (three) times daily., Disp: 540 tablet, Rfl: 1   spironolactone (ALDACTONE) 25 MG tablet, Take 1 tablet (25 mg total) by mouth daily. With breakfast (Patient not taking: Reported on 02/16/2022), Disp: 90 tablet, Rfl: 0   Allergies  Allergen Reactions   Shellfish Allergy Rash and Swelling   Codeine Nausea And Vomiting   Sulfa Antibiotics Swelling  Social History   Tobacco Use   Smoking status: Never   Smokeless tobacco: Never  Vaping Use   Vaping Use: Never used  Substance Use Topics   Alcohol use: No   Drug use: No      Chart Review Today: I personally reviewed active problem list, medication list, allergies, family history, social history, health maintenance, notes from last encounter, lab results, imaging with the patient/caregiver today.   Review of Systems  Constitutional: Negative.   HENT: Negative.    Eyes: Negative.   Respiratory: Negative.    Cardiovascular: Negative.   Gastrointestinal: Negative.   Endocrine: Negative.   Genitourinary: Negative.   Musculoskeletal: Negative.   Skin: Negative.   Allergic/Immunologic: Negative.   Neurological: Negative.   Hematological: Negative.   Psychiatric/Behavioral: Negative.    All other systems reviewed and are negative.      Objective:   Vitals:   02/16/22 0924  BP: (!) 104/58  Pulse: 65  Resp: 18  Temp: (!) 97.4 F (36.3 C)  TempSrc: Oral  SpO2: 98%  Weight: 183 lb (83 kg)  Height: 5\' 4"  (1.626 m)    Body mass index is 31.41 kg/m.  Physical Exam Vitals and nursing note reviewed.  Constitutional:       General: She is not in acute distress.    Appearance: Normal appearance. She is well-developed. She is not ill-appearing, toxic-appearing or diaphoretic.     Interventions: Face mask in place.  HENT:     Head: Normocephalic and atraumatic.     Right Ear: External ear normal.     Left Ear: External ear normal.     Nose: No congestion or rhinorrhea.     Mouth/Throat:     Mouth: Mucous membranes are moist.     Pharynx: Oropharynx is clear. No oropharyngeal exudate or posterior oropharyngeal erythema.  Eyes:     General: Lids are normal. No scleral icterus.       Right eye: No discharge.        Left eye: No discharge.     Conjunctiva/sclera: Conjunctivae normal.  Neck:     Trachea: Trachea and phonation normal. No tracheal deviation.  Cardiovascular:     Rate and Rhythm: Normal rate and regular rhythm.     Pulses: Normal pulses.          Radial pulses are 2+ on the right side and 2+ on the left side.       Posterior tibial pulses are 2+ on the right side and 2+ on the left side.     Heart sounds: Normal heart sounds. No murmur heard.    No friction rub. No gallop.  Pulmonary:     Effort: Pulmonary effort is normal. No respiratory distress.     Breath sounds: Normal breath sounds. No stridor. No wheezing, rhonchi or rales.  Chest:     Chest wall: No tenderness.  Abdominal:     General: Bowel sounds are normal. There is no distension.     Palpations: Abdomen is soft.  Musculoskeletal:     Cervical back: Rigidity present. Decreased range of motion (chronic).     Right lower leg: No edema.     Left lower leg: No edema.  Skin:    General: Skin is warm and dry.     Coloration: Skin is not jaundiced or pale.     Findings: No rash.  Neurological:     Mental Status: She is alert.     Motor: No abnormal muscle  tone.     Gait: Gait normal.  Psychiatric:        Mood and Affect: Mood normal.        Speech: Speech normal.        Behavior: Behavior normal.      Results for orders  placed or performed in visit on 01/17/22  TSH  Result Value Ref Range   TSH 2.53 0.40 - 4.50 mIU/L  CBC with Differential  Result Value Ref Range   WBC 5.1 3.8 - 10.8 Thousand/uL   RBC 3.97 3.80 - 5.10 Million/uL   Hemoglobin 12.0 11.7 - 15.5 g/dL   HCT 40.3 47.4 - 25.9 %   MCV 89.7 80.0 - 100.0 fL   MCH 30.2 27.0 - 33.0 pg   MCHC 33.7 32.0 - 36.0 g/dL   RDW 56.3 87.5 - 64.3 %   Platelets 216 140 - 400 Thousand/uL   MPV 10.4 7.5 - 12.5 fL   Neutro Abs 2,698 1,500 - 7,800 cells/uL   Lymphs Abs 1,872 850 - 3,900 cells/uL   Absolute Monocytes 418 200 - 950 cells/uL   Eosinophils Absolute 102 15 - 500 cells/uL   Basophils Absolute 10 0 - 200 cells/uL   Neutrophils Relative % 52.9 %   Total Lymphocyte 36.7 %   Monocytes Relative 8.2 %   Eosinophils Relative 2.0 %   Basophils Relative 0.2 %  Comprehensive metabolic panel  Result Value Ref Range   Glucose, Bld 97 65 - 99 mg/dL   BUN 11 7 - 25 mg/dL   Creat 3.29 (H) 5.18 - 1.00 mg/dL   BUN/Creatinine Ratio 11 6 - 22 (calc)   Sodium 137 135 - 146 mmol/L   Potassium 4.5 3.5 - 5.3 mmol/L   Chloride 103 98 - 110 mmol/L   CO2 25 20 - 32 mmol/L   Calcium 10.0 8.6 - 10.4 mg/dL   Total Protein 7.3 6.1 - 8.1 g/dL   Albumin 4.6 3.6 - 5.1 g/dL   Globulin 2.7 1.9 - 3.7 g/dL (calc)   AG Ratio 1.7 1.0 - 2.5 (calc)   Total Bilirubin 0.4 0.2 - 1.2 mg/dL   Alkaline phosphatase (APISO) 36 (L) 37 - 153 U/L   AST 24 10 - 35 U/L   ALT 14 6 - 29 U/L       Assessment & Plan:   1. Syncope, unspecified syncope type No syncope since last OV, and meds have been adjusted by cardiology She is careful with her pain meds - she feels muscle relaxer affects her the most and she is taking lowest dose possible BP soft today but overall MAP is ok  2. Primary hypertension Well controlled - meds changed per cardiology - only on BB  Monitor pulse with other meds and f/up with [place] or cardiology is BP too low Pulse Readings from Last 3 Encounters:  02/16/22  65  01/17/22 71  12/30/21 97  Today pulse in normal range and no reports at home of bradycardia - encouraged ability at home to monitor BP and HR with cuff and pulse ox  3. High risk medication use Manged by specialists at [facility] - has narcan, using lowest doses to manage pain and allow her to be as functional as possible  4. At risk for polypharmacy Monitoring closely and so are her specialists  5. Need for pneumococcal vaccination Due - encouraged her to get today  6. Need for shingles vaccine Explained she can get here or at the pharmacy - Zoster Vaccine  Adjuvanted [facility]) injection; Inject 0.5 mLs into the muscle once for 1 dose.  Dispense: 0.5 mL; Refill: 0  7. Chronic rhinitis Trial of different medicine since antihistamines and intranasal steroids sprays do not seem to be helping - can see ENT if needed  - ipratropium (ATROVENT) 0.03 % nasal spray; Place 2 sprays into both nostrils every 12 (twelve) hours.  Dispense: 30 mL; Refill: 12      Danelle Berry, PA-C 02/16/22 9:29 AM

## 2022-03-01 ENCOUNTER — Other Ambulatory Visit: Payer: Self-pay | Admitting: Family Medicine

## 2022-03-01 DIAGNOSIS — G629 Polyneuropathy, unspecified: Secondary | ICD-10-CM

## 2022-03-01 DIAGNOSIS — M797 Fibromyalgia: Secondary | ICD-10-CM

## 2022-03-01 DIAGNOSIS — G8929 Other chronic pain: Secondary | ICD-10-CM

## 2022-03-07 ENCOUNTER — Other Ambulatory Visit: Payer: Self-pay | Admitting: Family Medicine

## 2022-03-07 DIAGNOSIS — R7303 Prediabetes: Secondary | ICD-10-CM

## 2022-03-08 DIAGNOSIS — M542 Cervicalgia: Secondary | ICD-10-CM | POA: Diagnosis not present

## 2022-03-08 DIAGNOSIS — M530 Cervicocranial syndrome: Secondary | ICD-10-CM | POA: Diagnosis not present

## 2022-03-14 DIAGNOSIS — M542 Cervicalgia: Secondary | ICD-10-CM | POA: Diagnosis not present

## 2022-03-14 DIAGNOSIS — M530 Cervicocranial syndrome: Secondary | ICD-10-CM | POA: Diagnosis not present

## 2022-03-16 DIAGNOSIS — H04543 Stenosis of bilateral lacrimal canaliculi: Secondary | ICD-10-CM | POA: Diagnosis not present

## 2022-03-17 DIAGNOSIS — M530 Cervicocranial syndrome: Secondary | ICD-10-CM | POA: Diagnosis not present

## 2022-03-17 DIAGNOSIS — M542 Cervicalgia: Secondary | ICD-10-CM | POA: Diagnosis not present

## 2022-03-20 DIAGNOSIS — Z79891 Long term (current) use of opiate analgesic: Secondary | ICD-10-CM | POA: Diagnosis not present

## 2022-03-20 DIAGNOSIS — M542 Cervicalgia: Secondary | ICD-10-CM | POA: Diagnosis not present

## 2022-03-20 DIAGNOSIS — Z5181 Encounter for therapeutic drug level monitoring: Secondary | ICD-10-CM | POA: Diagnosis not present

## 2022-03-20 DIAGNOSIS — Z981 Arthrodesis status: Secondary | ICD-10-CM | POA: Diagnosis not present

## 2022-03-20 DIAGNOSIS — G8929 Other chronic pain: Secondary | ICD-10-CM | POA: Diagnosis not present

## 2022-03-20 DIAGNOSIS — G8928 Other chronic postprocedural pain: Secondary | ICD-10-CM | POA: Diagnosis not present

## 2022-03-20 DIAGNOSIS — M7061 Trochanteric bursitis, right hip: Secondary | ICD-10-CM | POA: Diagnosis not present

## 2022-03-22 DIAGNOSIS — M542 Cervicalgia: Secondary | ICD-10-CM | POA: Diagnosis not present

## 2022-03-22 DIAGNOSIS — M530 Cervicocranial syndrome: Secondary | ICD-10-CM | POA: Diagnosis not present

## 2022-03-23 DIAGNOSIS — M5412 Radiculopathy, cervical region: Secondary | ICD-10-CM | POA: Diagnosis not present

## 2022-03-23 DIAGNOSIS — W1839XA Other fall on same level, initial encounter: Secondary | ICD-10-CM | POA: Diagnosis not present

## 2022-03-23 DIAGNOSIS — M4312 Spondylolisthesis, cervical region: Secondary | ICD-10-CM | POA: Diagnosis not present

## 2022-03-23 DIAGNOSIS — M5481 Occipital neuralgia: Secondary | ICD-10-CM | POA: Diagnosis not present

## 2022-03-23 DIAGNOSIS — S199XXA Unspecified injury of neck, initial encounter: Secondary | ICD-10-CM | POA: Diagnosis not present

## 2022-03-23 DIAGNOSIS — G5601 Carpal tunnel syndrome, right upper limb: Secondary | ICD-10-CM | POA: Diagnosis not present

## 2022-03-23 DIAGNOSIS — M542 Cervicalgia: Secondary | ICD-10-CM | POA: Diagnosis not present

## 2022-03-23 DIAGNOSIS — Z981 Arthrodesis status: Secondary | ICD-10-CM | POA: Diagnosis not present

## 2022-03-23 DIAGNOSIS — M47812 Spondylosis without myelopathy or radiculopathy, cervical region: Secondary | ICD-10-CM | POA: Diagnosis not present

## 2022-03-24 DIAGNOSIS — M530 Cervicocranial syndrome: Secondary | ICD-10-CM | POA: Diagnosis not present

## 2022-03-24 DIAGNOSIS — M542 Cervicalgia: Secondary | ICD-10-CM | POA: Diagnosis not present

## 2022-04-03 DIAGNOSIS — M542 Cervicalgia: Secondary | ICD-10-CM | POA: Diagnosis not present

## 2022-04-03 DIAGNOSIS — M530 Cervicocranial syndrome: Secondary | ICD-10-CM | POA: Diagnosis not present

## 2022-04-04 ENCOUNTER — Other Ambulatory Visit: Payer: Self-pay | Admitting: Family Medicine

## 2022-04-04 DIAGNOSIS — F3342 Major depressive disorder, recurrent, in full remission: Secondary | ICD-10-CM

## 2022-04-04 DIAGNOSIS — M5481 Occipital neuralgia: Secondary | ICD-10-CM | POA: Diagnosis not present

## 2022-04-04 DIAGNOSIS — G629 Polyneuropathy, unspecified: Secondary | ICD-10-CM

## 2022-04-04 DIAGNOSIS — M797 Fibromyalgia: Secondary | ICD-10-CM

## 2022-04-04 DIAGNOSIS — G8929 Other chronic pain: Secondary | ICD-10-CM

## 2022-04-11 DIAGNOSIS — M530 Cervicocranial syndrome: Secondary | ICD-10-CM | POA: Diagnosis not present

## 2022-04-11 DIAGNOSIS — M542 Cervicalgia: Secondary | ICD-10-CM | POA: Diagnosis not present

## 2022-04-14 DIAGNOSIS — M530 Cervicocranial syndrome: Secondary | ICD-10-CM | POA: Diagnosis not present

## 2022-04-14 DIAGNOSIS — M542 Cervicalgia: Secondary | ICD-10-CM | POA: Diagnosis not present

## 2022-04-18 DIAGNOSIS — H04551 Acquired stenosis of right nasolacrimal duct: Secondary | ICD-10-CM | POA: Diagnosis not present

## 2022-04-19 DIAGNOSIS — M542 Cervicalgia: Secondary | ICD-10-CM | POA: Diagnosis not present

## 2022-04-19 DIAGNOSIS — M530 Cervicocranial syndrome: Secondary | ICD-10-CM | POA: Diagnosis not present

## 2022-04-20 DIAGNOSIS — M5481 Occipital neuralgia: Secondary | ICD-10-CM | POA: Diagnosis not present

## 2022-04-26 DIAGNOSIS — M530 Cervicocranial syndrome: Secondary | ICD-10-CM | POA: Diagnosis not present

## 2022-04-26 DIAGNOSIS — M542 Cervicalgia: Secondary | ICD-10-CM | POA: Diagnosis not present

## 2022-04-28 DIAGNOSIS — M530 Cervicocranial syndrome: Secondary | ICD-10-CM | POA: Diagnosis not present

## 2022-04-28 DIAGNOSIS — M542 Cervicalgia: Secondary | ICD-10-CM | POA: Diagnosis not present

## 2022-05-01 DIAGNOSIS — M530 Cervicocranial syndrome: Secondary | ICD-10-CM | POA: Diagnosis not present

## 2022-05-01 DIAGNOSIS — M542 Cervicalgia: Secondary | ICD-10-CM | POA: Diagnosis not present

## 2022-05-03 DIAGNOSIS — M542 Cervicalgia: Secondary | ICD-10-CM | POA: Diagnosis not present

## 2022-05-03 DIAGNOSIS — M530 Cervicocranial syndrome: Secondary | ICD-10-CM | POA: Diagnosis not present

## 2022-05-05 ENCOUNTER — Other Ambulatory Visit: Payer: Self-pay | Admitting: Family Medicine

## 2022-05-05 DIAGNOSIS — F3342 Major depressive disorder, recurrent, in full remission: Secondary | ICD-10-CM

## 2022-05-05 DIAGNOSIS — G629 Polyneuropathy, unspecified: Secondary | ICD-10-CM

## 2022-05-05 DIAGNOSIS — G8929 Other chronic pain: Secondary | ICD-10-CM

## 2022-05-05 DIAGNOSIS — M797 Fibromyalgia: Secondary | ICD-10-CM

## 2022-05-05 NOTE — Telephone Encounter (Signed)
Requested Prescriptions  Pending Prescriptions Disp Refills  . DULoxetine (CYMBALTA) 20 MG capsule [Pharmacy Med Name: DULOXETINE HCL DR 20 MG CAP] 270 capsule 0    Sig: Take 2 CAPSULES IN THE MORNING AND ONE IN THE EVENING.     Psychiatry: Antidepressants - SNRI - duloxetine Failed - 05/05/2022  9:17 AM      Failed - Cr in normal range and within 360 days    Creat  Date Value Ref Range Status  01/17/2022 1.03 (H) 0.60 - 1.00 mg/dL Final   Creatinine, Urine  Date Value Ref Range Status  10/03/2021 203 20 - 275 mg/dL Final         Passed - eGFR is 30 or above and within 360 days    GFR, Est African American  Date Value Ref Range Status  09/16/2020 80 > OR = 60 mL/min/1.68m Final   GFR, Est Non African American  Date Value Ref Range Status  09/16/2020 69 > OR = 60 mL/min/1.752mFinal   eGFR  Date Value Ref Range Status  12/30/2021 71 > OR = 60 mL/min/1.7361minal    Comment:    The eGFR is based on the CKD-EPI 2021 equation. To calculate  the new eGFR from a previous Creatinine or Cystatin C result, go to https://www.kidney.org/professionals/ kdoqi/gfr%5Fcalculator          Passed - Completed PHQ-2 or PHQ-9 in the last 360 days      Passed - Last BP in normal range    BP Readings from Last 1 Encounters:  02/16/22 (!) 104/58         Passed - Valid encounter within last 6 months    Recent Outpatient Visits          2 months ago Syncope, unspecified syncope type   [facility]   3 months ago Syncope, unspecified syncope type   [facility]   4 months ago Type 2 diabetes mellitus without complication, without long-term current use of insulin ([facility] Royals F[facility]   7 months ago Type 2 diabetes mellitus without complication, without long-term current use of insulin ([facility] CHMElk RidgeNP   9 months ago Mild  intermittent asthma with exacerbation   [facility]      Future Appointments            In 3 months TapDelsa GranaA-C [facility]ercivalIn 4 months  [facility]

## 2022-05-08 DIAGNOSIS — M530 Cervicocranial syndrome: Secondary | ICD-10-CM | POA: Diagnosis not present

## 2022-05-08 DIAGNOSIS — M542 Cervicalgia: Secondary | ICD-10-CM | POA: Diagnosis not present

## 2022-05-09 DIAGNOSIS — M5481 Occipital neuralgia: Secondary | ICD-10-CM | POA: Diagnosis not present

## 2022-05-15 DIAGNOSIS — M530 Cervicocranial syndrome: Secondary | ICD-10-CM | POA: Diagnosis not present

## 2022-05-15 DIAGNOSIS — M542 Cervicalgia: Secondary | ICD-10-CM | POA: Diagnosis not present

## 2022-05-17 DIAGNOSIS — M542 Cervicalgia: Secondary | ICD-10-CM | POA: Diagnosis not present

## 2022-05-17 DIAGNOSIS — M530 Cervicocranial syndrome: Secondary | ICD-10-CM | POA: Diagnosis not present

## 2022-05-18 ENCOUNTER — Other Ambulatory Visit: Payer: Self-pay | Admitting: Family Medicine

## 2022-05-18 DIAGNOSIS — M542 Cervicalgia: Secondary | ICD-10-CM

## 2022-05-18 NOTE — Telephone Encounter (Signed)
Medication Refill - Medication: meloxicam (MOBIC) 15 MG tablet  Has the patient contacted their pharmacy? Yes.   Pharmacy said they sent request for refill, no request in pt chart  Preferred Pharmacy (with phone number or street name):  [facility], [place] Phone:  [phone]  Fax:  [phone]     Has the patient been seen for an appointment in the last year OR does the patient have an upcoming appointment? Yes.    Agent: Please be advised that RX refills may take up to 3 business days. We ask that you follow-up with your pharmacy.

## 2022-05-19 ENCOUNTER — Telehealth (INDEPENDENT_AMBULATORY_CARE_PROVIDER_SITE_OTHER): Payer: Medicare PPO | Admitting: Family Medicine

## 2022-05-19 DIAGNOSIS — G35D Multiple sclerosis, unspecified: Secondary | ICD-10-CM

## 2022-05-19 DIAGNOSIS — M5416 Radiculopathy, lumbar region: Secondary | ICD-10-CM | POA: Diagnosis not present

## 2022-05-19 DIAGNOSIS — Z96653 Presence of artificial knee joint, bilateral: Secondary | ICD-10-CM | POA: Diagnosis not present

## 2022-05-19 DIAGNOSIS — G35 Multiple sclerosis: Secondary | ICD-10-CM | POA: Diagnosis not present

## 2022-05-19 DIAGNOSIS — M5412 Radiculopathy, cervical region: Secondary | ICD-10-CM | POA: Diagnosis not present

## 2022-05-19 DIAGNOSIS — Z9181 History of falling: Secondary | ICD-10-CM

## 2022-05-19 DIAGNOSIS — G8928 Other chronic postprocedural pain: Secondary | ICD-10-CM

## 2022-05-19 DIAGNOSIS — Z0289 Encounter for other administrative examinations: Secondary | ICD-10-CM

## 2022-05-19 MED ORDER — MELOXICAM 15 MG PO TABS: 15.0000 mg | ORAL_TABLET | Freq: Every day | ORAL | 0 refills | Status: DC

## 2022-05-19 NOTE — Telephone Encounter (Signed)
Requested Prescriptions  Pending Prescriptions Disp Refills  . meloxicam (MOBIC) 15 MG tablet 90 tablet 0    Sig: Take 1 tablet (15 mg total) by mouth daily.     Analgesics:  COX2 Inhibitors Failed - 05/18/2022 12:56 PM      Failed - Manual Review: Labs are only required if the patient has taken medication for more than 8 weeks.      Failed - Cr in normal range and within 360 days    Creat  Date Value Ref Range Status  01/17/2022 1.03 (H) 0.60 - 1.00 mg/dL Final   Creatinine, Urine  Date Value Ref Range Status  10/03/2021 203 20 - 275 mg/dL Final         Passed - HGB in normal range and within 360 days    Hemoglobin  Date Value Ref Range Status  01/17/2022 12.0 11.7 - 15.5 g/dL Final  09/20/2015 13.6 11.1 - 15.9 g/dL Final         Passed - HCT in normal range and within 360 days    HCT  Date Value Ref Range Status  01/17/2022 35.6 35.0 - 45.0 % Final   Hematocrit  Date Value Ref Range Status  09/20/2015 38.4 34.0 - 46.6 % Final         Passed - AST in normal range and within 360 days    AST  Date Value Ref Range Status  01/17/2022 24 10 - 35 U/L Final         Passed - ALT in normal range and within 360 days    ALT  Date Value Ref Range Status  01/17/2022 14 6 - 29 U/L Final         Passed - eGFR is 30 or above and within 360 days    GFR, Est African American  Date Value Ref Range Status  09/16/2020 80 > OR = 60 mL/min/1.64m Final   GFR, Est Non African American  Date Value Ref Range Status  09/16/2020 69 > OR = 60 mL/min/1.738mFinal   eGFR  Date Value Ref Range Status  12/30/2021 71 > OR = 60 mL/min/1.7332minal    Comment:    The eGFR is based on the CKD-EPI 2021 equation. To calculate  the new eGFR from a previous Creatinine or Cystatin C result, go to https://www.kidney.org/professionals/ kdoqi/gfr%5Fcalculator          Passed - Patient is not pregnant      Passed - Valid encounter within last 12 months    Recent Outpatient Visits           3 months ago Syncope, unspecified syncope type   [facility]   4 months ago Syncope, unspecified syncope type   [facility]   4 months ago Type 2 diabetes mellitus without complication, without long-term current use of insulin ([facility] Royals FNP   7 months ago Type 2 diabetes mellitus without complication, without long-term current use of insulin ([facility] CHMWetmoreNP   10 months ago Mild intermittent asthma with exacerbation   [facility] SizerD      Future Appointments            In 3 months TapDelsa GranaA-C [facility] LPECLinwoodIn 3 months  [facility]

## 2022-05-19 NOTE — Progress Notes (Unsigned)
Name: Brittany Hill   MRN: [id number]    DOB: 01-30-1951   Date:05/19/2022       Progress Note  Subjective:    Chief Complaint  Chief Complaint  Patient presents with   Request Letter    Letter from PCP [place]. No vital signs provided    I connected with  Brittany Hill on 05/19/22 at  2:00 PM EDT by telephone and verified that I am speaking with the correct person using two identifiers.   I discussed the limitations, risks, security and privacy concerns of performing an evaluation and management service by telephone and the availability of in person appointments. Staff also discussed with the patient that there may be a patient responsible charge related to this service.  Patient verbalized understanding and agreed to proceed with encounter. Patient Location: home Provider Location: [facility] office Additional Individuals present: none  HPI Encounter today to review a letter and request received from the pt -patient request a letter of support regarding application for construction for handicap sidewalk [place], her [place] request was denied and pt has consulted with advisor for ADA who is helping with her appeal due like reasonable accommodations with her known medical history, including but not limited to multiple sclerosis, fibromyalgia, multiple spinal surgeries with lumbar radiculopathy and cervical radiculopathy, causing gait, balance and mobility deficits and increasing her fall risk.  Having a handicap sidewalk with rails from private property to the water is medically indicated for a patient with her medical history when seeking to avoid falls and injuries, we do medically encourage stable smooth walkways and handrails both of which are not currently on the 30 foot distance from her property to the water which currently has a steep decline and undisturbed vegetation.     Chart hx reviewed from [facility] everywhere with multiple specialists visits, surgeries, procedures and imaging and recent worsening of her cervical spine/neck mobility and new severe chronic pain despite 2 surgeries for which she is now requiring pain management which also poses an increased risk of falls.   [facility], pain management, neurosurgery recent notes, summary and hx thoroughly reviewed - since they have managed majority of her conditions which warrant ADA accommodations - their chart hx and documentation will be most accurate and helpful -    Surgical hx: PAST SURGICAL HISTORY:  Past Surgical History:  Procedure Laterality Date  BACK SURGERY 2017  L4-5 Fusion Dr Gearlean Alf @ [facility]  LUMB SPINE FUSION COMBINED Bilateral 12/06/2015  Procedure: L4-5 Laminectomy, TLIF, Fusion/Stabilization, Bilateral; Surgeon: Judeth Porch, MD; Location: [facility] OR; Service: Neurosurgery; Laterality: Bilateral;  INSERTION MORSELIZED BONE ALLOGRAFT FOR SPINE SURGERY Bilateral 12/06/2015  Procedure: INSERTION MORSELIZED BONE ALLOGRAFT FOR SPINE SURGERY; Surgeon: Judeth Porch, MD; Location: [facility] OR; Service: Neurosurgery; Laterality: Bilateral;  AUTOGRAFT OBTAINED SAME INCISION FOR SPINE SURGERY Bilateral 12/06/2015  Procedure: AUTOGRAFT OBTAINED SAME INCISION FOR SPINE SURGERY; Surgeon: Judeth Porch, MD; Location: [facility] OR; Service: Neurosurgery; Laterality: Bilateral;  LAMINECTOMY POSTERIOR LUMBAR FACETECTOMY & FORAMINOTOMY W/DECOMP Bilateral 12/06/2015  Procedure: LAMINECTOMY POSTERIOR LUMBAR FACETECTOMY & FORAMINOTOMY W/DECOMP; Surgeon: Judeth Porch, MD; Location: [facility] OR; Service: Neurosurgery; Laterality: Bilateral;  ASPIRATION BONE MARROW Bilateral 12/06/2015  Procedure: BONE MARROW; ASPIRATION ONLY; Surgeon: Judeth Porch, MD; Location: [facility] OR; Service: Neurosurgery; Laterality: Bilateral;  ARTHRODESIS ANTERIOR CERVICLE SPINE N/A 05/14/2019  Procedure: Anterior  Cervical Discectomy and Fusion C4-5,  C5-6, C6-7 at with VG1 Vertigraft allograft and Nuvasive Archon plate; Surgeon: Redge Gainer, MD; Location: [facility]; Service: Neurosurgery; Laterality: N/A;  ARTHRODESIS ANTERIOR CERVICLE SPINE N/A 05/14/2019  Procedure: 61950 X 2 ARTHRODESIS ANT INTERBODY INC DISCECTOMY, CERVICAL EACH ADDL; Surgeon: Redge Gainer, MD; Location: [facility]; Service: Neurosurgery; Laterality: N/A;  INSTRUMENTATION ANTERIOR SPINE 8/MORE SEGMENTS N/A 05/14/2019  Procedure: ANTERIOR INSTRUMENTATION; 4 TO 7 VERTEBRAL SEGMENTS (LIST IN ADDITION TO PRIMARY PROCEDURE); Surgeon: Redge Gainer, MD; Location: [facility]; Service: Neurosurgery; Laterality: N/A;  INSERTION STRUCTURAL BONE ALLOGRAFT FOR SPINE SURGERY N/A 05/14/2019  Procedure: ALLOGRAFT, STRUCTURAL, FOR SPINE SURGERY ONLY (LIST IN ADDITION TO PRIMARY PROCEDURE); Surgeon: Redge Gainer, MD; Location: [facility]; Service: Neurosurgery; Laterality: N/A;  ARTHRODESIS POSTERIOR CERVICAL ONE LEVEL Bilateral 04/06/2021  Procedure: ARTHRODESIS, DTRAX; POSTERIOR OR POSTEROLATERAL TECHNIQUE, SINGLE interspace; CERVICAL BELOW C2 SEGMENT; Surgeon: Modena Morrow, Arlina Robes, MD; Location: [facility]; Service: Neurosurgery; Laterality: Bilateral; (Procedure; C3 or C4 -T1 posterior cervical instrumentation and fusion: jackson table, Mayfield head rest, Prone position, Medtronic Infinity, c-arm, midas, B  ARTHRODESIS POSTERIOR SPINE Bilateral 04/06/2021  Procedure: ARTHRODESIS, POSTERIOR OR POSTEROLATERAL TECHNIQUE, SINGLE interspace; EACH ADDITIONAL (LIST IN ADDITION TO PRIMARY PROCEDURE); Surgeon: Juliette Alcide, MD; Location: [facility]; Service: Neurosurgery; Laterality: Bilateral; x 5  INSTRUMENTATION POSTERIOR SPINE 7 TO 12 VERTEBRAL SEGMENTS Bilateral 04/06/2021  Procedure: POSTERIOR SEGMENTAL INSTRUMENTATION (EG, PEDICLE FIXATION, DUAL RODS WITH  MULTIPLE HOOKS AND SUBLAMINAR WIRES); 3 TO 6 VERTEBRAL SEGMENTS (LIST IN ADDITION TO PRIMARY PROCEDURE); Surgeon: Juliette Alcide, MD; Location: [facility]; Service: Neurosurgery; Laterality: Bilateral;  INSERTION MORSELIZED BONE ALLOGRAFT FOR SPINE SURGERY Bilateral 04/06/2021  Procedure: ALLOGRAFT, MORSELIZED, OR PLACEMENT OF OSTEOPROMOTIVE MATERIAL, FOR SPINE SURGERY ONLY (LIST IN ADDITION TO PRIMARY PROCEDURE); Surgeon: Juliette Alcide, MD; Location: [facility]; Service: Neurosurgery; Laterality: Bilateral;  ARTHROSCOPIC ROTATOR CUFF REPAIR Right  EYE SURGERY  Cataract Right eye removed  HYSTERECTOMY  JOINT REPLACEMENT Bilateral  both knee replacement  KNEE ARTHROSCOPY Right  x2   MS hx - she needs to f/up with her specialists Dr Ned Grace [facility]  Bilateral LE paresthesias  Weak right leg  New tremor that neurosurgery is working up    Patient Active Problem List   Diagnosis Date Noted   Syncope 01/17/2022   Dyslipidemia associated with type 2 diabetes mellitus ([place]) 07/12/2021   Acquired stenosis of bilateral nasolacrimal duct 02/08/2021   Type 2 diabetes mellitus without complication, without long-term current use of insulin ([place]) 03/22/2020   Acquired trigger finger 01/06/2020   Bouchard's nodes (with arthropathy) 01/06/2020   Chronic post-operative pain 09/05/2019   Neck pain 09/05/2019   Cervical radiculopathy 09/05/2019   Displacement of cervical intervertebral disc 05/14/2019   Fatty liver disease, nonalcoholic 93/26/7124   Osteopenia 10/20/2018   Obesity (BMI 30.0-34.9) 09/27/2018   Cataract, left 04/02/2018   Osteoarthritis of left ankle 02/16/2018   Asthma without status asthmaticus 10/05/2017   Genetic testing 08/28/2017   Family hx-breast malignancy 08/02/2017   Dense breast tissue on mammogram 07/28/2016   Left sided sciatica 12/19/2015   Left lumbar radiculopathy 12/16/2015   Hypomagnesemia 12/06/2015    Hypokalemia 12/02/2015   Fibromyalgia    MS (multiple sclerosis) (HCC)    Chronic constipation    OA (osteoarthritis)    Hypothyroidism    IFG (impaired fasting glucose)    Hypertension    Hyperlipidemia    GERD (gastroesophageal reflux disease)    MDD (major depressive disorder), recurrent,  in full remission (HCC)    Anxiety    TMJ syndrome     Social History   Tobacco Use   Smoking status: Never   Smokeless tobacco: Never  Substance Use Topics   Alcohol use: No     Current Outpatient Medications:    acetaminophen (TYLENOL) 500 MG tablet, Take 1,000 mg by mouth every 6 (six) hours as needed., Disp: , Rfl:    albuterol (VENTOLIN HFA) 108 (90 Base) MCG/ACT inhaler, Inhale 2 puffs into the lungs every 6 (six) hours as needed for wheezing or shortness of breath., Disp: 18 g, Rfl: 0   calcium carbonate (OS-CAL) 600 MG TABS tablet, Take 600 mg by mouth as needed. , Disp: , Rfl:    cholecalciferol (VITAMIN D3) 25 MCG (1000 UNIT) tablet, Take 1,000 Units by mouth daily., Disp: , Rfl:    DULoxetine (CYMBALTA) 20 MG capsule, Take 2 CAPSULES IN THE MORNING AND ONE IN THE EVENING., Disp: 270 capsule, Rfl: 0   EPINEPHrine 0.3 mg/0.3 mL IJ SOAJ injection, Inject 0.3 mg into the muscle once. , Disp: , Rfl:    ezetimibe-simvastatin (VYTORIN) 10-40 MG tablet, Take 1 tablet by mouth at bedtime., Disp: 90 tablet, Rfl: 3   famotidine (PEPCID) 20 MG tablet, Take 1 tablet (20 mg total) by mouth 2 (two) times daily as needed for heartburn or indigestion., Disp: 60 tablet, Rfl: 5   fluticasone-salmeterol (ADVAIR) 100-50 MCG/ACT AEPB, Inhale 1 puff into the lungs 2 (two) times daily., Disp: 1 each, Rfl: 1   ipratropium (ATROVENT) 0.03 % nasal spray, Place 2 sprays into both nostrils every 12 (twelve) hours., Disp: 30 mL, Rfl: 12   levothyroxine (SYNTHROID) 75 MCG tablet, Take 1 tablet (75 mcg total) by mouth daily before breakfast., Disp: 90 tablet, Rfl: 1   meloxicam (MOBIC) 15 MG tablet, Take 1 tablet  (15 mg total) by mouth daily., Disp: 90 tablet, Rfl: 0   metFORMIN (GLUCOPHAGE-XR) 500 MG 24 hr tablet, Take 1 tablet (500 mg total) by mouth 2 (two) times daily., Disp: 180 tablet, Rfl: 3   metoprolol succinate (TOPROL-XL) 50 MG 24 hr tablet, Take 1 tablet (50 mg total) by mouth at bedtime. Take with or immediately following a meal., Disp: 90 tablet, Rfl: 3   montelukast (SINGULAIR) 10 MG tablet, Take 1 tablet (10 mg total) by mouth at bedtime., Disp: 90 tablet, Rfl: 3   naloxone (NARCAN) nasal spray 4 mg/0.1 mL, Place into the nose., Disp: , Rfl:    ondansetron (ZOFRAN ODT) 4 MG disintegrating tablet, Take 1 tablet (4 mg total) by mouth every 8 (eight) hours as needed for nausea or vomiting., Disp: 20 tablet, Rfl: 1   oxycodone (OXY-IR) 5 MG capsule, Take 5 mg by mouth every 6 (six) hours as needed., Disp: , Rfl:    pantoprazole (PROTONIX) 40 MG tablet, Take 1 tablet (40 mg total) by mouth at bedtime., Disp: 90 tablet, Rfl: 0   pregabalin (LYRICA) 100 MG capsule, Take 100 mg EVERY MORNING, 100 mg EVERY midday and 200 mg at bedtime for acute on chronic neck pain, postop pain, Disp: 324 capsule, Rfl: 0   spironolactone (ALDACTONE) 25 MG tablet, Take 1 tablet (25 mg total) by mouth daily. With breakfast, Disp: 90 tablet, Rfl: 0   tiZANidine (ZANAFLEX) 4 MG tablet, Take 2 tablets (8 mg total) by mouth 3 (three) times daily., Disp: 540 tablet, Rfl: 1  Allergies  Allergen Reactions   Shellfish Allergy Rash and Swelling   Codeine Nausea And  Vomiting   Sulfa Antibiotics Swelling    Chart Review: I personally reviewed active problem list, medication list, allergies, family history, social history, health maintenance, notes from last encounter, lab results, imaging with the patient/caregiver today.   Review of Systems  Constitutional: Negative.   HENT: Negative.    Eyes: Negative.   Respiratory: Negative.    Cardiovascular: Negative.   Gastrointestinal: Negative.   Endocrine: Negative.    Genitourinary: Negative.   Musculoskeletal: Negative.   Skin: Negative.   Allergic/Immunologic: Negative.   Neurological: Negative.   Hematological: Negative.   Psychiatric/Behavioral: Negative.    All other systems reviewed and are negative.    Objective:    Virtual encounter, vitals limited, only able to obtain the following There were no vitals filed for this visit. There is no height or weight on file to calculate BMI. Nursing Note and Vital Signs reviewed.  Physical Exam Vitals and nursing note reviewed.  Neck:     Trachea: Phonation normal.  Neurological:     Mental Status: She is alert.  Psychiatric:        Mood and Affect: Mood normal.     PE limited by telephone encounter  No results found for this or any previous visit (from the past 72 hour(s)).  Assessment and Plan:     ICD-10-CM   1. MS (multiple sclerosis) (HCC)  G35     2. Left lumbar radiculopathy  M54.16     3. Cervical radiculopathy  M54.12     4. Chronic post-operative pain  G89.28     5. History of total knee replacement, bilateral  Z96.653     6. At moderate risk for fall  Z91.81     7. Encounter for completion of form with patient  Z02.89    letter to be written and sent to pt through mychart      -Red flags and when to present for emergency care or RTC including but not limited to new/worsening/un-resolving symptoms,  reviewed with patient at time of visit. Follow up and care instructions discussed and provided in AVS. - I discussed the assessment and treatment plan with the patient. The patient was provided an opportunity to ask questions and all were answered. The patient agreed with the plan and demonstrated an understanding of the instructions.  - The patient was advised to call back or seek an in-person evaluation if the symptoms worsen or if the condition fails to improve as anticipated.  I provided 25+ minutes of non-face-to-face time during this encounter.  Danelle Berry,  PA-C 05/19/22 2:02 PM

## 2022-05-19 NOTE — Assessment & Plan Note (Signed)
Dx first 04/12/91, reports her specialists is Orion Modest? 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

## 2022-05-22 DIAGNOSIS — M530 Cervicocranial syndrome: Secondary | ICD-10-CM | POA: Diagnosis not present

## 2022-05-22 DIAGNOSIS — M542 Cervicalgia: Secondary | ICD-10-CM | POA: Diagnosis not present

## 2022-05-23 ENCOUNTER — Other Ambulatory Visit: Payer: Self-pay | Admitting: Family Medicine

## 2022-05-23 DIAGNOSIS — E039 Hypothyroidism, unspecified: Secondary | ICD-10-CM

## 2022-05-24 DIAGNOSIS — M530 Cervicocranial syndrome: Secondary | ICD-10-CM | POA: Diagnosis not present

## 2022-05-24 DIAGNOSIS — M542 Cervicalgia: Secondary | ICD-10-CM | POA: Diagnosis not present

## 2022-05-25 DIAGNOSIS — G5611 Other lesions of median nerve, right upper limb: Secondary | ICD-10-CM | POA: Diagnosis not present

## 2022-05-29 DIAGNOSIS — M542 Cervicalgia: Secondary | ICD-10-CM | POA: Diagnosis not present

## 2022-05-29 DIAGNOSIS — M530 Cervicocranial syndrome: Secondary | ICD-10-CM | POA: Diagnosis not present

## 2022-05-31 DIAGNOSIS — M542 Cervicalgia: Secondary | ICD-10-CM | POA: Diagnosis not present

## 2022-05-31 DIAGNOSIS — M530 Cervicocranial syndrome: Secondary | ICD-10-CM | POA: Diagnosis not present

## 2022-06-01 ENCOUNTER — Other Ambulatory Visit: Payer: Self-pay | Admitting: Family Medicine

## 2022-06-01 DIAGNOSIS — G629 Polyneuropathy, unspecified: Secondary | ICD-10-CM

## 2022-06-01 DIAGNOSIS — M797 Fibromyalgia: Secondary | ICD-10-CM

## 2022-06-01 DIAGNOSIS — K219 Gastro-esophageal reflux disease without esophagitis: Secondary | ICD-10-CM

## 2022-06-01 DIAGNOSIS — G8929 Other chronic pain: Secondary | ICD-10-CM

## 2022-06-05 ENCOUNTER — Ambulatory Visit: Payer: Medicare PPO

## 2022-06-07 DIAGNOSIS — M530 Cervicocranial syndrome: Secondary | ICD-10-CM | POA: Diagnosis not present

## 2022-06-07 DIAGNOSIS — M542 Cervicalgia: Secondary | ICD-10-CM | POA: Diagnosis not present

## 2022-06-12 DIAGNOSIS — M530 Cervicocranial syndrome: Secondary | ICD-10-CM | POA: Diagnosis not present

## 2022-06-12 DIAGNOSIS — M542 Cervicalgia: Secondary | ICD-10-CM | POA: Diagnosis not present

## 2022-06-14 DIAGNOSIS — M530 Cervicocranial syndrome: Secondary | ICD-10-CM | POA: Diagnosis not present

## 2022-06-14 DIAGNOSIS — M542 Cervicalgia: Secondary | ICD-10-CM | POA: Diagnosis not present

## 2022-06-16 DIAGNOSIS — G8929 Other chronic pain: Secondary | ICD-10-CM | POA: Diagnosis not present

## 2022-06-16 DIAGNOSIS — M7061 Trochanteric bursitis, right hip: Secondary | ICD-10-CM | POA: Diagnosis not present

## 2022-06-19 DIAGNOSIS — M530 Cervicocranial syndrome: Secondary | ICD-10-CM | POA: Diagnosis not present

## 2022-06-19 DIAGNOSIS — M542 Cervicalgia: Secondary | ICD-10-CM | POA: Diagnosis not present

## 2022-06-21 DIAGNOSIS — M530 Cervicocranial syndrome: Secondary | ICD-10-CM | POA: Diagnosis not present

## 2022-06-21 DIAGNOSIS — M542 Cervicalgia: Secondary | ICD-10-CM | POA: Diagnosis not present

## 2022-06-26 DIAGNOSIS — Z79891 Long term (current) use of opiate analgesic: Secondary | ICD-10-CM | POA: Diagnosis not present

## 2022-06-26 DIAGNOSIS — Z981 Arthrodesis status: Secondary | ICD-10-CM | POA: Diagnosis not present

## 2022-06-26 DIAGNOSIS — M25551 Pain in right hip: Secondary | ICD-10-CM | POA: Diagnosis not present

## 2022-06-26 DIAGNOSIS — M7061 Trochanteric bursitis, right hip: Secondary | ICD-10-CM | POA: Diagnosis not present

## 2022-06-26 DIAGNOSIS — M542 Cervicalgia: Secondary | ICD-10-CM | POA: Diagnosis not present

## 2022-06-26 DIAGNOSIS — M160 Bilateral primary osteoarthritis of hip: Secondary | ICD-10-CM | POA: Diagnosis not present

## 2022-06-26 DIAGNOSIS — G8929 Other chronic pain: Secondary | ICD-10-CM | POA: Diagnosis not present

## 2022-06-26 DIAGNOSIS — Z5181 Encounter for therapeutic drug level monitoring: Secondary | ICD-10-CM | POA: Diagnosis not present

## 2022-06-26 DIAGNOSIS — G8928 Other chronic postprocedural pain: Secondary | ICD-10-CM | POA: Diagnosis not present

## 2022-06-27 DIAGNOSIS — M530 Cervicocranial syndrome: Secondary | ICD-10-CM | POA: Diagnosis not present

## 2022-06-27 DIAGNOSIS — M542 Cervicalgia: Secondary | ICD-10-CM | POA: Diagnosis not present

## 2022-06-28 ENCOUNTER — Other Ambulatory Visit: Payer: Self-pay | Admitting: Family Medicine

## 2022-06-28 NOTE — Telephone Encounter (Signed)
Requested Prescriptions  Pending Prescriptions Disp Refills   spironolactone (ALDACTONE) 25 MG tablet [Pharmacy Med Name: SPIRONOLACTONE 25 MG TABLET] 90 tablet 0    Sig: Take 1 tablet (25 mg total) by mouth daily. With breakfast     Cardiovascular: Diuretics - Aldosterone Antagonist Failed - 06/28/2022 10:31 AM      Failed - Cr in normal range and within 180 days    Creat  Date Value Ref Range Status  01/17/2022 1.03 (H) 0.60 - 1.00 mg/dL Final   Creatinine, Urine  Date Value Ref Range Status  10/03/2021 203 20 - 275 mg/dL Final         Passed - K in normal range and within 180 days    Potassium  Date Value Ref Range Status  01/17/2022 4.5 3.5 - 5.3 mmol/L Final         Passed - Na in normal range and within 180 days    Sodium  Date Value Ref Range Status  01/17/2022 137 135 - 146 mmol/L Final  06/03/2015 139 136 - 144 mmol/L Final    Comment:                  **Please note reference interval change**         Passed - eGFR is 30 or above and within 180 days    GFR, Est African American  Date Value Ref Range Status  09/16/2020 80 > OR = 60 mL/min/1.64m Final   GFR, Est Non African American  Date Value Ref Range Status  09/16/2020 69 > OR = 60 mL/min/1.770mFinal   eGFR  Date Value Ref Range Status  12/30/2021 71 > OR = 60 mL/min/1.7352minal    Comment:    The eGFR is based on the CKD-EPI 2021 equation. To calculate  the new eGFR from a previous Creatinine or Cystatin C result, go to https://www.kidney.org/professionals/ kdoqi/gfr%5Fcalculator          Passed - Last BP in normal range    BP Readings from Last 1 Encounters:  02/16/22 (!) 104/58         Passed - Valid encounter within last 6 months    Recent Outpatient Visits           1 month ago [facility]multiple [facility]   4 months ago Syncope, unspecified syncope type   [facility] Miss[facility]   5 months ago Syncope,  unspecified syncope type   [facility]   6 months ago Type 2 diabetes mellitus without complication, without long-term current use of insulin ([facility] Royals F[facility]   8 months ago Type 2 diabetes mellitus without complication, without long-term current use of insulin ([facility] MerinoNP       Future Appointments             In 1 month TapDelsa GranaA-C [facility]avesIn 2 months  [facility]

## 2022-07-03 DIAGNOSIS — M542 Cervicalgia: Secondary | ICD-10-CM | POA: Diagnosis not present

## 2022-07-03 DIAGNOSIS — M530 Cervicocranial syndrome: Secondary | ICD-10-CM | POA: Diagnosis not present

## 2022-07-05 DIAGNOSIS — M542 Cervicalgia: Secondary | ICD-10-CM | POA: Diagnosis not present

## 2022-07-05 DIAGNOSIS — M530 Cervicocranial syndrome: Secondary | ICD-10-CM | POA: Diagnosis not present

## 2022-07-10 DIAGNOSIS — M542 Cervicalgia: Secondary | ICD-10-CM | POA: Diagnosis not present

## 2022-07-10 DIAGNOSIS — M530 Cervicocranial syndrome: Secondary | ICD-10-CM | POA: Diagnosis not present

## 2022-07-11 DIAGNOSIS — M5481 Occipital neuralgia: Secondary | ICD-10-CM | POA: Diagnosis not present

## 2022-07-11 DIAGNOSIS — M960 Pseudarthrosis after fusion or arthrodesis: Secondary | ICD-10-CM | POA: Diagnosis not present

## 2022-07-11 DIAGNOSIS — G5601 Carpal tunnel syndrome, right upper limb: Secondary | ICD-10-CM | POA: Diagnosis not present

## 2022-07-11 DIAGNOSIS — M542 Cervicalgia: Secondary | ICD-10-CM | POA: Diagnosis not present

## 2022-07-11 DIAGNOSIS — Z981 Arthrodesis status: Secondary | ICD-10-CM | POA: Diagnosis not present

## 2022-07-11 DIAGNOSIS — M5412 Radiculopathy, cervical region: Secondary | ICD-10-CM | POA: Diagnosis not present

## 2022-07-12 DIAGNOSIS — E039 Hypothyroidism, unspecified: Secondary | ICD-10-CM | POA: Diagnosis not present

## 2022-07-12 DIAGNOSIS — Z882 Allergy status to sulfonamides status: Secondary | ICD-10-CM | POA: Diagnosis not present

## 2022-07-12 DIAGNOSIS — J45909 Unspecified asthma, uncomplicated: Secondary | ICD-10-CM | POA: Diagnosis not present

## 2022-07-12 DIAGNOSIS — E669 Obesity, unspecified: Secondary | ICD-10-CM | POA: Diagnosis not present

## 2022-07-12 DIAGNOSIS — H04553 Acquired stenosis of bilateral nasolacrimal duct: Secondary | ICD-10-CM | POA: Diagnosis not present

## 2022-07-12 DIAGNOSIS — Z7984 Long term (current) use of oral hypoglycemic drugs: Secondary | ICD-10-CM | POA: Diagnosis not present

## 2022-07-12 DIAGNOSIS — E785 Hyperlipidemia, unspecified: Secondary | ICD-10-CM | POA: Diagnosis not present

## 2022-07-12 DIAGNOSIS — K219 Gastro-esophageal reflux disease without esophagitis: Secondary | ICD-10-CM | POA: Diagnosis not present

## 2022-07-12 DIAGNOSIS — I10 Essential (primary) hypertension: Secondary | ICD-10-CM | POA: Diagnosis not present

## 2022-07-17 DIAGNOSIS — M530 Cervicocranial syndrome: Secondary | ICD-10-CM | POA: Diagnosis not present

## 2022-07-17 DIAGNOSIS — M542 Cervicalgia: Secondary | ICD-10-CM | POA: Diagnosis not present

## 2022-07-21 ENCOUNTER — Telehealth: Payer: Self-pay | Admitting: Family Medicine

## 2022-07-21 NOTE — Telephone Encounter (Signed)
Pt stopped by and stated that Sheliah Mends was suppose to write an letter about [place] for handic[place] a few weeks back but pt noticed it is not in her chart. Virl Axe are you able to write the letter. Also pt is dropping of handicap placard form today and is requesting it be filled out. Please call once done.

## 2022-07-24 NOTE — Telephone Encounter (Signed)
Ok under the Letters tab there is nothing FYI.

## 2022-07-24 NOTE — Telephone Encounter (Signed)
I have printed letter since pt will be picking up tomorrow her handicap form she will have both in hand. Have both upfront for pt to pick up

## 2022-07-27 ENCOUNTER — Other Ambulatory Visit: Payer: Self-pay | Admitting: Family Medicine

## 2022-07-27 DIAGNOSIS — M797 Fibromyalgia: Secondary | ICD-10-CM

## 2022-07-27 DIAGNOSIS — G8929 Other chronic pain: Secondary | ICD-10-CM

## 2022-07-27 DIAGNOSIS — F3342 Major depressive disorder, recurrent, in full remission: Secondary | ICD-10-CM

## 2022-07-27 DIAGNOSIS — G629 Polyneuropathy, unspecified: Secondary | ICD-10-CM

## 2022-07-28 DIAGNOSIS — M530 Cervicocranial syndrome: Secondary | ICD-10-CM | POA: Diagnosis not present

## 2022-07-28 DIAGNOSIS — M542 Cervicalgia: Secondary | ICD-10-CM | POA: Diagnosis not present

## 2022-08-02 DIAGNOSIS — M5416 Radiculopathy, lumbar region: Secondary | ICD-10-CM | POA: Diagnosis not present

## 2022-08-02 DIAGNOSIS — M545 Low back pain, unspecified: Secondary | ICD-10-CM | POA: Diagnosis not present

## 2022-08-02 DIAGNOSIS — M960 Pseudarthrosis after fusion or arthrodesis: Secondary | ICD-10-CM | POA: Diagnosis not present

## 2022-08-02 DIAGNOSIS — Z981 Arthrodesis status: Secondary | ICD-10-CM | POA: Diagnosis not present

## 2022-08-04 DIAGNOSIS — Z981 Arthrodesis status: Secondary | ICD-10-CM | POA: Diagnosis not present

## 2022-08-04 DIAGNOSIS — M4727 Other spondylosis with radiculopathy, lumbosacral region: Secondary | ICD-10-CM | POA: Diagnosis not present

## 2022-08-04 DIAGNOSIS — M4726 Other spondylosis with radiculopathy, lumbar region: Secondary | ICD-10-CM | POA: Diagnosis not present

## 2022-08-04 DIAGNOSIS — M48061 Spinal stenosis, lumbar region without neurogenic claudication: Secondary | ICD-10-CM | POA: Diagnosis not present

## 2022-08-04 DIAGNOSIS — M545 Low back pain, unspecified: Secondary | ICD-10-CM | POA: Diagnosis not present

## 2022-08-04 DIAGNOSIS — M960 Pseudarthrosis after fusion or arthrodesis: Secondary | ICD-10-CM | POA: Diagnosis not present

## 2022-08-04 DIAGNOSIS — M5416 Radiculopathy, lumbar region: Secondary | ICD-10-CM | POA: Diagnosis not present

## 2022-08-05 DIAGNOSIS — M545 Low back pain, unspecified: Secondary | ICD-10-CM | POA: Diagnosis not present

## 2022-08-05 DIAGNOSIS — M960 Pseudarthrosis after fusion or arthrodesis: Secondary | ICD-10-CM | POA: Diagnosis not present

## 2022-08-05 DIAGNOSIS — M5416 Radiculopathy, lumbar region: Secondary | ICD-10-CM | POA: Diagnosis not present

## 2022-08-05 DIAGNOSIS — Z981 Arthrodesis status: Secondary | ICD-10-CM | POA: Diagnosis not present

## 2022-08-08 DIAGNOSIS — G5601 Carpal tunnel syndrome, right upper limb: Secondary | ICD-10-CM | POA: Diagnosis not present

## 2022-08-09 DIAGNOSIS — M542 Cervicalgia: Secondary | ICD-10-CM | POA: Diagnosis not present

## 2022-08-09 DIAGNOSIS — M530 Cervicocranial syndrome: Secondary | ICD-10-CM | POA: Diagnosis not present

## 2022-08-11 DIAGNOSIS — M542 Cervicalgia: Secondary | ICD-10-CM | POA: Diagnosis not present

## 2022-08-11 DIAGNOSIS — M530 Cervicocranial syndrome: Secondary | ICD-10-CM | POA: Diagnosis not present

## 2022-08-14 DIAGNOSIS — M542 Cervicalgia: Secondary | ICD-10-CM | POA: Diagnosis not present

## 2022-08-14 DIAGNOSIS — M530 Cervicocranial syndrome: Secondary | ICD-10-CM | POA: Diagnosis not present

## 2022-08-16 DIAGNOSIS — M530 Cervicocranial syndrome: Secondary | ICD-10-CM | POA: Diagnosis not present

## 2022-08-16 DIAGNOSIS — M542 Cervicalgia: Secondary | ICD-10-CM | POA: Diagnosis not present

## 2022-08-18 IMAGING — MG MM DIGITAL SCREENING BILAT W/ TOMO AND CAD
8 series · 8 of 24 positions shown · non-contrast
Comparison: Previous exam(s).

CLINICAL DATA: Screening.

EXAM:
DIGITAL SCREENING BILATERAL MAMMOGRAM WITH TOMOSYNTHESIS AND CAD
TECHNIQUE: Bilateral screening digital craniocaudal and mediolateral oblique
mammograms were obtained. Bilateral screening digital breast
tomosynthesis was performed. The images were evaluated with
computer-aided detection.

[R MLO synth-2D]
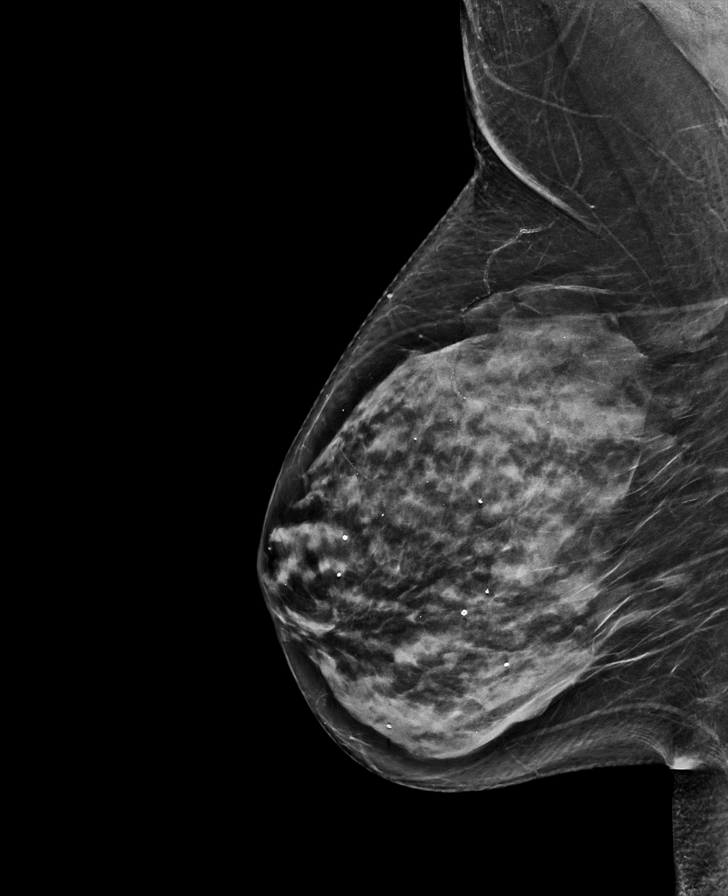

[R CC synth-2D]
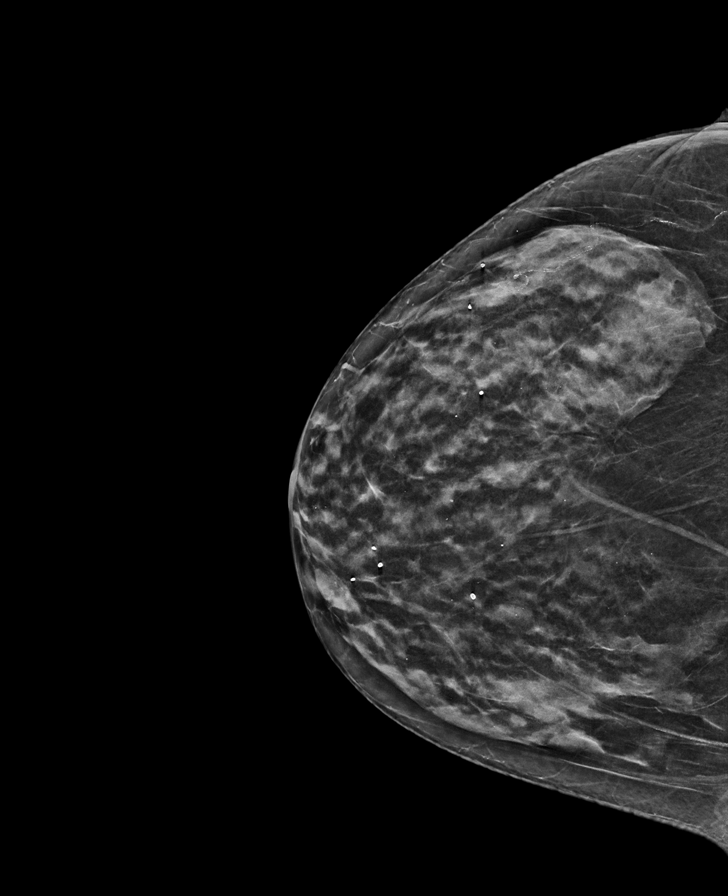

[L MLO synth-2D]
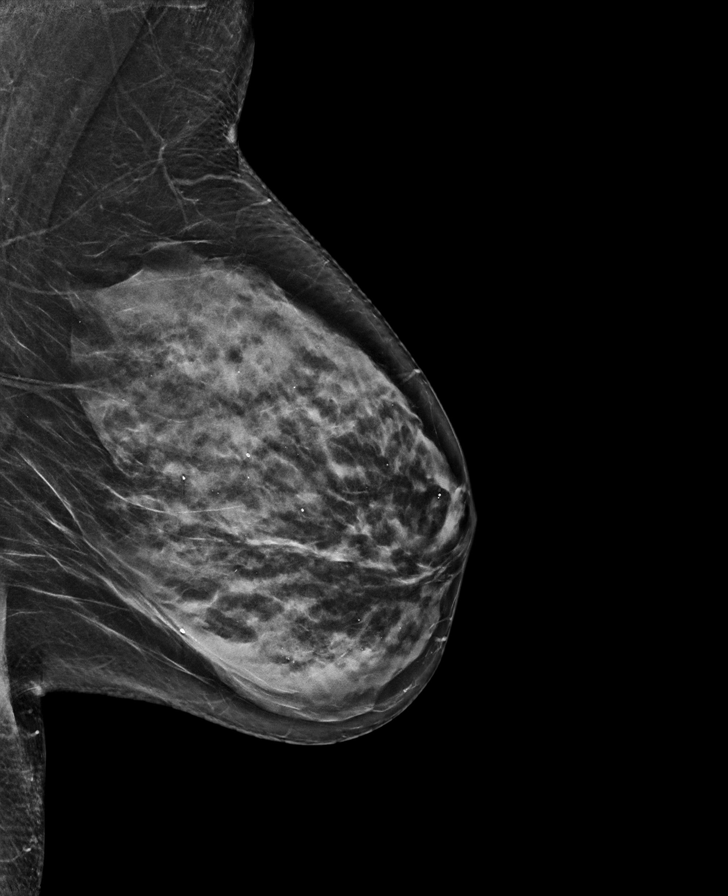

[L CC synth-2D]
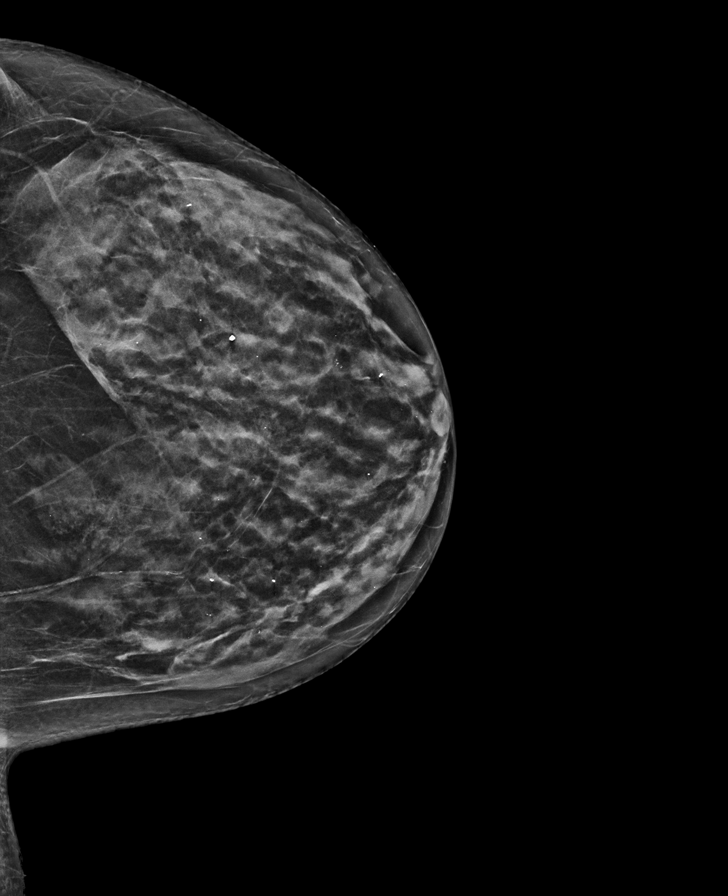

[R MLO tomo · tomo slice 35/70.0]
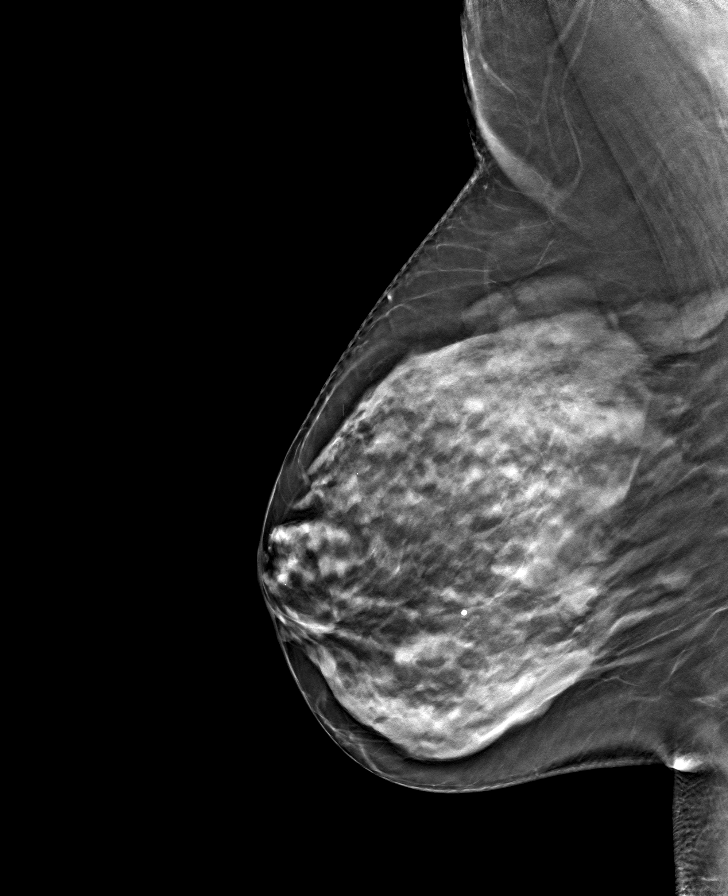

[L CC tomo · tomo slice 30/59.0]
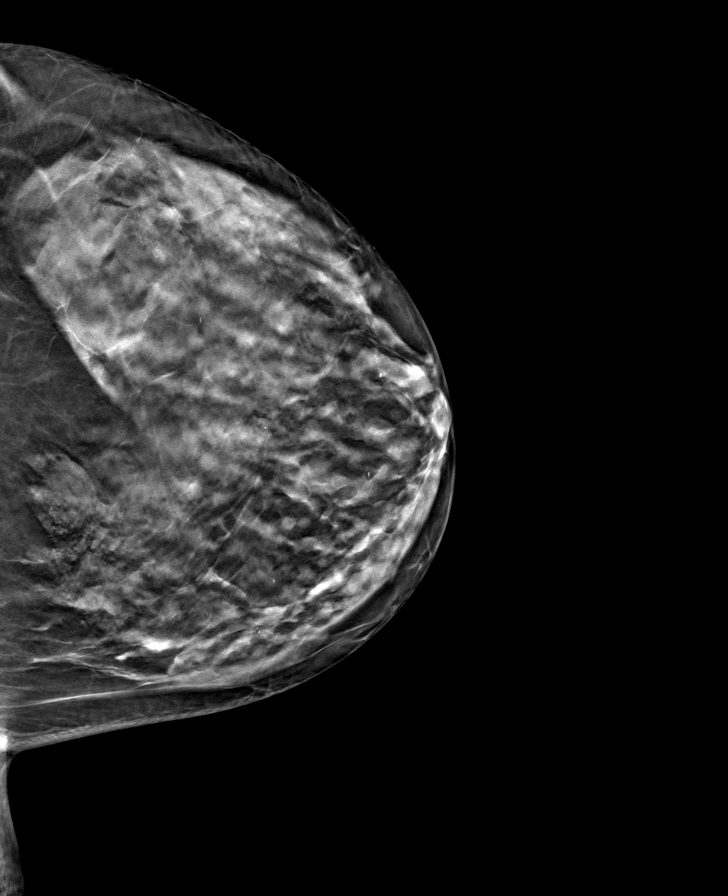

[R CC tomo · tomo slice 31/61.0]
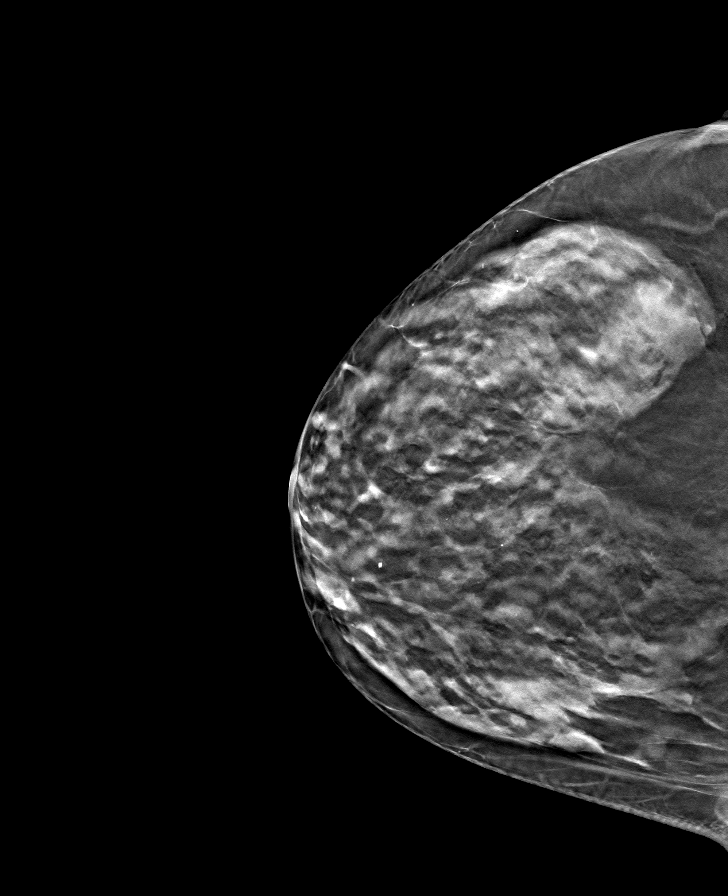

[L MLO tomo · tomo slice 34/67.0]
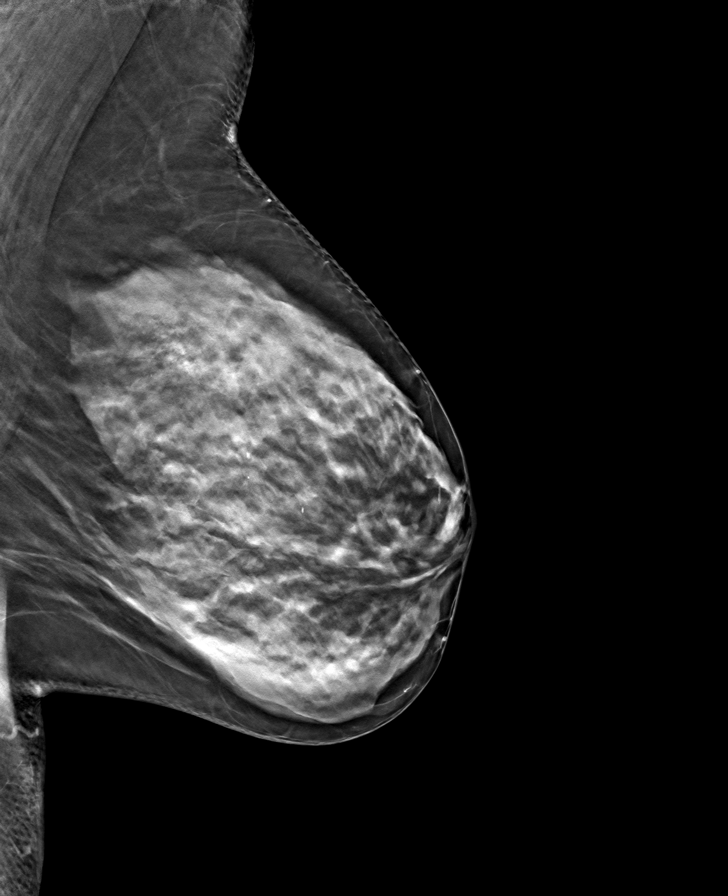

[8 of 24 positions shown; findings below may reference images not displayed]

ACR Breast Density Category d: The breast tissue is extremely dense,
which lowers the sensitivity of mammography
FINDINGS: There are no findings suspicious for malignancy.
IMPRESSION: No mammographic evidence of malignancy. A result letter of this
screening mammogram will be mailed directly to the patient.

RECOMMENDATION:
Screening mammogram in one year. (Code:TA-[id number])

BI-RADS CATEGORY  1: Negative.

## 2022-08-21 ENCOUNTER — Ambulatory Visit: Payer: Medicare PPO | Admitting: Physician Assistant

## 2022-08-21 ENCOUNTER — Encounter: Payer: Self-pay | Admitting: Physician Assistant

## 2022-08-21 VITALS — BP 114/70 | HR 69 | Temp 97.6°F | Resp 16 | Ht 64.0 in | Wt 188.7 lb

## 2022-08-21 DIAGNOSIS — E1169 Type 2 diabetes mellitus with other specified complication: Secondary | ICD-10-CM

## 2022-08-21 DIAGNOSIS — E039 Hypothyroidism, unspecified: Secondary | ICD-10-CM

## 2022-08-21 DIAGNOSIS — M797 Fibromyalgia: Secondary | ICD-10-CM | POA: Diagnosis not present

## 2022-08-21 DIAGNOSIS — I10 Essential (primary) hypertension: Secondary | ICD-10-CM | POA: Diagnosis not present

## 2022-08-21 DIAGNOSIS — G629 Polyneuropathy, unspecified: Secondary | ICD-10-CM | POA: Diagnosis not present

## 2022-08-21 DIAGNOSIS — Z23 Encounter for immunization: Secondary | ICD-10-CM

## 2022-08-21 DIAGNOSIS — E785 Hyperlipidemia, unspecified: Secondary | ICD-10-CM | POA: Diagnosis not present

## 2022-08-21 DIAGNOSIS — K219 Gastro-esophageal reflux disease without esophagitis: Secondary | ICD-10-CM | POA: Diagnosis not present

## 2022-08-21 DIAGNOSIS — M542 Cervicalgia: Secondary | ICD-10-CM | POA: Diagnosis not present

## 2022-08-21 DIAGNOSIS — M549 Dorsalgia, unspecified: Secondary | ICD-10-CM

## 2022-08-21 DIAGNOSIS — E119 Type 2 diabetes mellitus without complications: Secondary | ICD-10-CM | POA: Diagnosis not present

## 2022-08-21 DIAGNOSIS — M530 Cervicocranial syndrome: Secondary | ICD-10-CM | POA: Diagnosis not present

## 2022-08-21 DIAGNOSIS — G8929 Other chronic pain: Secondary | ICD-10-CM

## 2022-08-21 MED ORDER — AMLODIPINE BESYLATE 2.5 MG PO TABS: 2.5000 mg | ORAL_TABLET | Freq: Every day | ORAL | 1 refills | Status: DC

## 2022-08-21 MED ORDER — PREGABALIN 100 MG PO CAPS: ORAL_CAPSULE | ORAL | 0 refills | Status: DC

## 2022-08-21 NOTE — Assessment & Plan Note (Signed)
Chronic, ongoing Currently taking Levothyroxine 88 mcg PO QD and appears to be tolerating well Recheck TSH today for monitoring- results to dictate further management Follow up in 6 months or sooner if concerns arise

## 2022-08-21 NOTE — Progress Notes (Signed)
Established Patient Office visit    Patient: Brittany Hill   DOB: 07-20-1951   72 y.o. Female  MRN: [id number] Visit Date: 08/21/2022  Today's healthcare provider: Oswaldo Conroy Ruth Kovich, PA-C  Introduced myself to the patient as a Secondary school teacher and provided education on APPs in clinical practice.    Chief Complaint  Patient presents with   Follow-up   Hypertension    Pt states at home BP readings are high around 180s/190s, today bp is normal range and she brought her bp machine to compare and it was normal range as well.   Hyperlipidemia   Diabetes   Hypothyroidism   Subjective    HPI HPI     Hypertension    Additional comments: Pt states at home BP readings are high around 180s/190s, today bp is normal range and she brought her bp machine to compare and it was normal range as well.      Last edited by Forde Radon, CMA on 08/21/2022  9:37 AM.       HYPERTENSION / HYPERLIPIDEMIA Satisfied with current treatment?  She reports her BP has been elevated at home  Duration of hypertension: chronic BP monitoring frequency: a few times a week  BP range: Reports BP in 180s-190s/ she reports she takes her BP after sitting and resting for a few minutes with both feet on floor  BP medication side effects: no Past BP meds:  Metoprolol  and Spironolactone  Duration of hyperlipidemia: chronic Cholesterol medication side effects: no Cholesterol supplements: none Past cholesterol medications: simvastatin (zocor) and ezetimide (zetia) Medication compliance: good compliance Aspirin: no Recent stressors: no Recurrent headaches: yes Visual changes: no Palpitations: no Dyspnea: no Chest pain: no Lower extremity edema: no Dizzy/lightheaded:  occasionally    Diabetes, Type 2 - Last A1c 5.9 - Medications: Metformin 500 mg PO BID,  - Compliance: Good compliance  - Checking BG at home: No - Diet: Not following any particular diet  - Exercise: She does water exercises and  is in PT 2x week  - Eye exam: UTD  - Foot exam: UTD - Microalbumin: UTD  - Statin: Yes on statin therapy  - PNA vaccine: next vaccine ordered today and administered  - Denies symptoms of hypoglycemia, polyuria, polydipsia, numbness extremities, foot ulcers/trauma   HYPOTHYROIDISM Thyroid control status:controlled Satisfied with current treatment? yes Medication side effects: no Medication compliance: excellent compliance Etiology of hypothyroidism:  Recent dose adjustment:no Fatigue: no Cold intolerance: no Heat intolerance: no Weight gain: no Weight loss: no Constipation: yes Diarrhea/loose stools: no Palpitations: no Lower extremity edema: no Anxiety/depressed mood: no   GERD GERD control status: controlled Satisfied with current treatment? yes Heartburn frequency:  Medication side effects: no  Medication compliance: stable GERD medications:Famotidine and Protonix  Antacid use frequency:  rarely using  Alleviatiating factors:   Aggravating factors:  Dysphagia: no Odynophagia:  no Hematemesis: no Blood in stool: no EGD: yes   Patient Active Problem List   Diagnosis Date Noted   History of total knee replacement, bilateral 05/19/2022   At moderate risk for fall 05/19/2022   Syncope 01/17/2022   Dyslipidemia associated with type 2 diabetes mellitus (HCC) 07/12/2021   Acquired stenosis of bilateral nasolacrimal duct 02/08/2021   Type 2 diabetes mellitus without complication, without long-term current use of insulin (HCC) 03/22/2020   Acquired trigger finger 01/06/2020   Bouchard's nodes (with arthropathy) 01/06/2020   Chronic post-operative pain 09/05/2019   Neck pain 09/05/2019  Cervical radiculopathy 09/05/2019   Displacement of cervical intervertebral disc 05/14/2019   Fatty liver disease, nonalcoholic 62/83/6629   Osteopenia 10/20/2018   Obesity (BMI 30.0-34.9) 09/27/2018   Cataract, left 04/02/2018   Osteoarthritis of left ankle 02/16/2018   Asthma  without status asthmaticus 10/05/2017   Genetic testing 08/28/2017   Family hx-breast malignancy 08/02/2017   Dense breast tissue on mammogram 07/28/2016   Left sided sciatica 12/19/2015   Left lumbar radiculopathy 12/16/2015   Hypomagnesemia 12/06/2015   Hypokalemia 12/02/2015   Fibromyalgia    MS (multiple sclerosis) (HCC)    Chronic constipation    OA (osteoarthritis)    Hypothyroidism    IFG (impaired fasting glucose)    Hypertension    Hyperlipidemia    GERD (gastroesophageal reflux disease)    MDD (major depressive disorder), recurrent, in full remission ([place])    Anxiety    TMJ syndrome       Medications: Outpatient Medications Prior to Visit  Medication Sig   acetaminophen (TYLENOL) 500 MG tablet Take 1,000 mg by mouth every 6 (six) hours as needed.   albuterol (VENTOLIN HFA) 108 (90 Base) MCG/ACT inhaler Inhale 2 puffs into the lungs every 6 (six) hours as needed for wheezing or shortness of breath.   calcium carbonate (OS-CAL) 600 MG TABS tablet Take 600 mg by mouth as needed.    cholecalciferol (VITAMIN D3) 25 MCG (1000 UNIT) tablet Take 1,000 Units by mouth daily.   DULoxetine (CYMBALTA) 20 MG capsule Take 2 CAPSULES IN THE MORNING AND ONE IN THE EVENING.   EPINEPHrine 0.3 mg/0.3 mL IJ SOAJ injection Inject 0.3 mg into the muscle once.    ezetimibe-simvastatin (VYTORIN) 10-40 MG tablet Take 1 tablet by mouth at bedtime.   famotidine (PEPCID) 20 MG tablet Take 1 tablet (20 mg total) by mouth 2 (two) times daily as needed for heartburn or indigestion.   fluticasone-salmeterol (ADVAIR) 100-50 MCG/ACT AEPB Inhale 1 puff into the lungs 2 (two) times daily.   ipratropium (ATROVENT) 0.03 % nasal spray Place 2 sprays into both nostrils every 12 (twelve) hours.   levothyroxine (SYNTHROID) 75 MCG tablet Take 1 tablet (75 mcg total) by mouth daily before breakfast.   meloxicam (MOBIC) 15 MG tablet Take 1 tablet (15 mg total) by mouth daily.   metFORMIN (GLUCOPHAGE-XR) 500 MG 24  hr tablet Take 1 tablet (500 mg total) by mouth 2 (two) times daily.   metoprolol succinate (TOPROL-XL) 50 MG 24 hr tablet Take 1 tablet (50 mg total) by mouth at bedtime. Take with or immediately following a meal.   montelukast (SINGULAIR) 10 MG tablet Take 1 tablet (10 mg total) by mouth at bedtime.   naloxone (NARCAN) nasal spray 4 mg/0.1 mL Place into the nose.   ondansetron (ZOFRAN ODT) 4 MG disintegrating tablet Take 1 tablet (4 mg total) by mouth every 8 (eight) hours as needed for nausea or vomiting.   oxycodone (OXY-IR) 5 MG capsule Take 5 mg by mouth every 6 (six) hours as needed.   pantoprazole (PROTONIX) 40 MG tablet Take 1 tablet (40 mg total) by mouth at bedtime.   spironolactone (ALDACTONE) 25 MG tablet Take 1 tablet (25 mg total) by mouth daily. With breakfast   tiZANidine (ZANAFLEX) 4 MG tablet Take 2 tablets (8 mg total) by mouth 3 (three) times daily.   [DISCONTINUED] pregabalin (LYRICA) 100 MG capsule Take 100 mg EVERY MORNING, 100 mg EVERY midday and 200 mg at bedtime for acute on chronic neck pain, postop pain  No facility-administered medications prior to visit.    Review of Systems  Constitutional:  Negative for fatigue.  Eyes:  Negative for visual disturbance.  Respiratory:  Negative for chest tightness and shortness of breath.   Cardiovascular:  Negative for chest pain, palpitations and leg swelling.  Gastrointestinal:  Positive for constipation. Negative for diarrhea and vomiting.  Endocrine: Negative for cold intolerance and heat intolerance.  Musculoskeletal:  Positive for myalgias and neck pain.  Neurological:  Positive for headaches.       Objective    BP 114/70   Pulse 69   Temp 97.6 F (36.4 C) (Oral)   Resp 16   Ht 5\' 4"  (1.626 m)   Wt 188 lb 11.2 oz (85.6 kg)   SpO2 96%   BMI 32.39 kg/m    Physical Exam Vitals reviewed.  Constitutional:      General: She is awake.     Appearance: Normal appearance. She is well-developed and well-groomed.   HENT:     Head: Normocephalic and atraumatic.  Cardiovascular:     Rate and Rhythm: Normal rate and regular rhythm.     Pulses: Normal pulses.     Heart sounds: Normal heart sounds. No murmur heard.    No friction rub. No gallop.  Pulmonary:     Effort: Pulmonary effort is normal.     Breath sounds: Normal breath sounds. No decreased breath sounds, wheezing, rhonchi or rales.  Musculoskeletal:     Cervical back: Normal range of motion and neck supple.     Right lower leg: No edema.     Left lower leg: No edema.  Neurological:     Mental Status: She is alert.  Psychiatric:        Attention and Perception: Attention and perception normal.        Mood and Affect: Mood and affect normal.        Speech: Speech normal.        Behavior: Behavior normal. Behavior is cooperative.       No results found for any visits on 08/21/22.  Assessment & Plan      Return in about 4 weeks (around 09/18/2022) for HTN.      Problem List Items Addressed This Visit       Cardiovascular and Mediastinum   Hypertension (Chronic)    Chronic, ongoing She is taking Metoprolol 50 mg PO QHS and Spironolactone 25 mg PO QD She reports concerns today for fluctuating BP with high elevations despite appropriate measuring practices at home She reports she was started on Spironolactone due to hypokalemia in the past  Recommend adding Amlodipine 2.5 mg PO QD to see if this stabilizes BP - may be able to dc one of the others if this provides adequate control Follow up in 4 weeks with daily BP record to see how she is responding       Relevant Medications   amLODipine (NORVASC) 2.5 MG tablet   Other Relevant Orders   COMPLETE METABOLIC PANEL WITH GFR   CBC w/Diff/Platelet     Digestive   GERD (gastroesophageal reflux disease) (Chronic)    Chronic, stable and appears well controlled She is taking Protonix 40 mg PO QHS and Pepcid Appears to be tolerating well without side effects Continue current  regimen Follow up as needed         Endocrine   Hypothyroidism (Chronic)    Chronic, ongoing Currently taking Levothyroxine 88 mcg PO QD and appears to be tolerating well  Recheck TSH today for monitoring- results to dictate further management Follow up in 6 months or sooner if concerns arise       Relevant Orders   TSH   Type 2 diabetes mellitus without complication, without long-term current use of insulin (HCC) - Primary    Chronic, ongoing, stable She is taking metformin 500 mg PO BID and appears to be tolerating well Previous A1c was within goal Recheck A1c today -results to dictate further management Follow up in 6 months or sooner if concerns arise       Relevant Orders   Hemoglobin A1c   Dyslipidemia associated with type 2 diabetes mellitus (HCC)    Chronic, appears stable on current regimen She is taking Vytorin 10-40 mg PO QHS and appears to be tolerating well Recheck lipid panel today for monitoring- results to dictate further management Follow up in 6 months pending results and concerns      Relevant Orders   Lipid Profile     Other   Fibromyalgia (Chronic)    Lyrica refill provided today per patient request PDMP reviewed, no evidence of worrisome controlled substance use patterns at this time  Follow up as needed       Relevant Medications   pregabalin (LYRICA) 100 MG capsule   Other Visit Diagnoses     Need for pneumococcal vaccination       Relevant Orders   Pneumococcal conjugate vaccine 20-valent (Completed)   Neuropathy       Relevant Medications   pregabalin (LYRICA) 100 MG capsule   Chronic back pain, unspecified back location, unspecified back pain laterality       Relevant Medications   pregabalin (LYRICA) 100 MG capsule        Return in about 4 weeks (around 09/18/2022) for HTN.   I, Olaf Mesa E Sarra Rachels, PA-C, have reviewed all documentation for this visit. The documentation on 08/21/22 for the exam, diagnosis, procedures, and orders are  all accurate and complete.   Jacquelin Hawking, MHS, PA-C [facility]

## 2022-08-21 NOTE — Assessment & Plan Note (Addendum)
Chronic, ongoing, stable She is taking metformin 500 mg PO BID and appears to be tolerating well Previous A1c was within goal Recheck A1c today -results to dictate further management Follow up in 6 months or sooner if concerns arise

## 2022-08-21 NOTE — Assessment & Plan Note (Signed)
Lyrica refill provided today per patient request PDMP reviewed, no evidence of worrisome controlled substance use patterns at this time  Follow up as needed

## 2022-08-21 NOTE — Assessment & Plan Note (Signed)
Chronic, ongoing She is taking Metoprolol 50 mg PO QHS and Spironolactone 25 mg PO QD She reports concerns today for fluctuating BP with high elevations despite appropriate measuring practices at home She reports she was started on Spironolactone due to hypokalemia in the past  Recommend adding Amlodipine 2.5 mg PO QD to see if this stabilizes BP - may be able to dc one of the others if this provides adequate control Follow up in 4 weeks with daily BP record to see how she is responding

## 2022-08-21 NOTE — Assessment & Plan Note (Signed)
Chronic, appears stable on current regimen She is taking Vytorin 10-40 mg PO QHS and appears to be tolerating well Recheck lipid panel today for monitoring- results to dictate further management Follow up in 6 months pending results and concerns

## 2022-08-21 NOTE — Assessment & Plan Note (Signed)
>>  ASSESSMENT AND PLAN FOR PREDIABETES WRITTEN ON 08/21/2022  2:19 PM BY MECUM, ERIN E, PA-C  Chronic, ongoing, stable She is taking metformin  500 mg PO BID and appears to be tolerating well Previous A1c was within goal Recheck A1c today -results to dictate further management Follow up in 6 months or sooner if concerns arise

## 2022-08-21 NOTE — Assessment & Plan Note (Signed)
Chronic, stable and appears well controlled She is taking Protonix 40 mg PO QHS and Pepcid Appears to be tolerating well without side effects Continue current regimen Follow up as needed

## 2022-08-22 ENCOUNTER — Other Ambulatory Visit: Payer: Self-pay | Admitting: Family Medicine

## 2022-08-22 DIAGNOSIS — M542 Cervicalgia: Secondary | ICD-10-CM

## 2022-08-22 LAB — CBC WITH DIFFERENTIAL/PLATELET
Absolute Monocytes: 494 cells/uL (ref 200–950)
Basophils Absolute: 31 cells/uL (ref 0–200)
Basophils Relative: 0.5 %
Eosinophils Absolute: 311 cells/uL (ref 15–500)
Eosinophils Relative: 5.1 %
HCT: 35.4 % (ref 35.0–45.0)
Hemoglobin: 12 g/dL (ref 11.7–15.5)
Lymphs Abs: 1488 cells/uL (ref 850–3900)
MCH: 30.3 pg (ref 27.0–33.0)
MCHC: 33.9 g/dL (ref 32.0–36.0)
MCV: 89.4 fL (ref 80.0–100.0)
MPV: 10.5 fL (ref 7.5–12.5)
Monocytes Relative: 8.1 %
Neutro Abs: 3776 cells/uL (ref 1500–7800)
Neutrophils Relative %: 61.9 %
Platelets: 188 10*3/uL (ref 140–400)
RBC: 3.96 10*6/uL (ref 3.80–5.10)
RDW: 12.6 % (ref 11.0–15.0)
Total Lymphocyte: 24.4 %
WBC: 6.1 10*3/uL (ref 3.8–10.8)

## 2022-08-22 LAB — COMPLETE METABOLIC PANEL WITH GFR
AG Ratio: 1.9 (calc) (ref 1.0–2.5)
ALT: 11 U/L (ref 6–29)
AST: 21 U/L (ref 10–35)
Albumin: 4.8 g/dL (ref 3.6–5.1)
Alkaline phosphatase (APISO): 33 U/L — ABNORMAL LOW (ref 37–153)
BUN: 12 mg/dL (ref 7–25)
CO2: 30 mmol/L (ref 20–32)
Calcium: 10.6 mg/dL — ABNORMAL HIGH (ref 8.6–10.4)
Chloride: 104 mmol/L (ref 98–110)
Creat: 0.92 mg/dL (ref 0.60–1.00)
Globulin: 2.5 g/dL (calc) (ref 1.9–3.7)
Glucose, Bld: 107 mg/dL — ABNORMAL HIGH (ref 65–99)
Potassium: 4.9 mmol/L (ref 3.5–5.3)
Sodium: 139 mmol/L (ref 135–146)
Total Bilirubin: 0.4 mg/dL (ref 0.2–1.2)
Total Protein: 7.3 g/dL (ref 6.1–8.1)
eGFR: 67 mL/min/{1.73_m2} (ref >=60)

## 2022-08-22 LAB — HEMOGLOBIN A1C
Hgb A1c MFr Bld: 6.2 % of total Hgb — ABNORMAL HIGH (ref <5.7)
Mean Plasma Glucose: 131 mg/dL
eAG (mmol/L): 7.3 mmol/L

## 2022-08-22 LAB — LIPID PANEL
Cholesterol: 154 mg/dL (ref <200)
HDL: 49 mg/dL — ABNORMAL LOW (ref >=50)
LDL Cholesterol (Calc): 79 mg/dL (calc)
Non-HDL Cholesterol (Calc): 105 mg/dL (calc) (ref <130)
Total CHOL/HDL Ratio: 3.1 (calc) (ref <5.0)
Triglycerides: 163 mg/dL — ABNORMAL HIGH (ref <150)

## 2022-08-22 LAB — TSH: TSH: 2.6 mIU/L (ref 0.40–4.50)

## 2022-08-23 ENCOUNTER — Telehealth: Payer: Self-pay | Admitting: Physician Assistant

## 2022-08-23 ENCOUNTER — Other Ambulatory Visit: Payer: Self-pay | Admitting: Family Medicine

## 2022-08-23 DIAGNOSIS — M530 Cervicocranial syndrome: Secondary | ICD-10-CM | POA: Diagnosis not present

## 2022-08-23 DIAGNOSIS — E039 Hypothyroidism, unspecified: Secondary | ICD-10-CM

## 2022-08-23 DIAGNOSIS — M542 Cervicalgia: Secondary | ICD-10-CM | POA: Diagnosis not present

## 2022-08-23 NOTE — Telephone Encounter (Signed)
Unable to refill per protocol, Rx request responded to 08/23/21. Will refuse.  Requested Prescriptions  Pending Prescriptions Disp Refills   meloxicam (MOBIC) 15 MG tablet [Pharmacy Med Name: MELOXICAM 15 MG TABLET] 90 tablet 0    Sig: Take 1 tablet (15 mg total) by mouth daily.     Analgesics:  COX2 Inhibitors Failed - 08/23/2022  2:17 PM      Failed - Manual Review: Labs are only required if the patient has taken medication for more than 8 weeks.      Passed - HGB in normal range and within 360 days    Hemoglobin  Date Value Ref Range Status  08/21/2022 12.0 11.7 - 15.5 g/dL Final  09/20/2015 13.6 11.1 - 15.9 g/dL Final         Passed - Cr in normal range and within 360 days    Creat  Date Value Ref Range Status  08/21/2022 0.92 0.60 - 1.00 mg/dL Final   Creatinine, Urine  Date Value Ref Range Status  10/03/2021 203 20 - 275 mg/dL Final         Passed - HCT in normal range and within 360 days    HCT  Date Value Ref Range Status  08/21/2022 35.4 35.0 - 45.0 % Final   Hematocrit  Date Value Ref Range Status  09/20/2015 38.4 34.0 - 46.6 % Final         Passed - AST in normal range and within 360 days    AST  Date Value Ref Range Status  08/21/2022 21 10 - 35 U/L Final         Passed - ALT in normal range and within 360 days    ALT  Date Value Ref Range Status  08/21/2022 11 6 - 29 U/L Final         Passed - eGFR is 30 or above and within 360 days    GFR, Est African American  Date Value Ref Range Status  09/16/2020 80 > OR = 60 mL/min/1.88m2 Final   GFR, Est Non African American  Date Value Ref Range Status  09/16/2020 69 > OR = 60 mL/min/1.70m2 Final   eGFR  Date Value Ref Range Status  08/21/2022 67 > OR = 60 mL/min/1.46m2 Final         Passed - Patient is not pregnant      Passed - Valid encounter within last 12 months    Recent Outpatient Visits           2 days ago Type 2 diabetes mellitus without complication, without long-term current use of  insulin ([place])   [facility] Mecum, Erin E, PA-C   3 months ago MS (multiple sclerosis) [facility])   [facility] [place], Kristeen Miss, PA-C   6 months ago Syncope, unspecified syncope type   [facility] Delsa Grana, PA-C   7 months ago Syncope, unspecified syncope type   [place], DO   7 months ago Type 2 diabetes mellitus without complication, without long-term current use of insulin [facility])   [facility] Bo Merino, FNP       Future Appointments             In 3 weeks  [facility] [place]   In 3 weeks Delsa Grana, PA-C [facility]

## 2022-08-24 DIAGNOSIS — Z981 Arthrodesis status: Secondary | ICD-10-CM | POA: Diagnosis not present

## 2022-08-24 DIAGNOSIS — M542 Cervicalgia: Secondary | ICD-10-CM | POA: Diagnosis not present

## 2022-08-24 DIAGNOSIS — M48061 Spinal stenosis, lumbar region without neurogenic claudication: Secondary | ICD-10-CM | POA: Diagnosis not present

## 2022-08-24 DIAGNOSIS — G8929 Other chronic pain: Secondary | ICD-10-CM | POA: Diagnosis not present

## 2022-08-28 DIAGNOSIS — M542 Cervicalgia: Secondary | ICD-10-CM | POA: Diagnosis not present

## 2022-08-28 DIAGNOSIS — M530 Cervicocranial syndrome: Secondary | ICD-10-CM | POA: Diagnosis not present

## 2022-08-30 DIAGNOSIS — M530 Cervicocranial syndrome: Secondary | ICD-10-CM | POA: Diagnosis not present

## 2022-08-30 DIAGNOSIS — M542 Cervicalgia: Secondary | ICD-10-CM | POA: Diagnosis not present

## 2022-08-31 ENCOUNTER — Telehealth: Payer: Self-pay | Admitting: *Deleted

## 2022-08-31 DIAGNOSIS — D2262 Melanocytic nevi of left upper limb, including shoulder: Secondary | ICD-10-CM | POA: Diagnosis not present

## 2022-08-31 DIAGNOSIS — D225 Melanocytic nevi of trunk: Secondary | ICD-10-CM | POA: Diagnosis not present

## 2022-08-31 DIAGNOSIS — D2261 Melanocytic nevi of right upper limb, including shoulder: Secondary | ICD-10-CM | POA: Diagnosis not present

## 2022-08-31 DIAGNOSIS — R202 Paresthesia of skin: Secondary | ICD-10-CM | POA: Diagnosis not present

## 2022-08-31 DIAGNOSIS — D2271 Melanocytic nevi of right lower limb, including hip: Secondary | ICD-10-CM | POA: Diagnosis not present

## 2022-08-31 DIAGNOSIS — D2272 Melanocytic nevi of left lower limb, including hip: Secondary | ICD-10-CM | POA: Diagnosis not present

## 2022-08-31 NOTE — Progress Notes (Signed)
  Care Coordination  Outreach Note  08/31/2022 Name: Brittany Hill MRN: [id number] DOB: 04/19/1951   Care Coordination Outreach Attempts: An unsuccessful telephone outreach was attempted today to offer the patient information about available care coordination services as a benefit of their health plan.   Follow Up Plan:  Additional outreach attempts will be made to offer the patient care coordination information and services.   Encounter Outcome:  No Answer  Julian Hy, [place] Direct Dial: [phone]

## 2022-09-04 DIAGNOSIS — M5451 Vertebrogenic low back pain: Secondary | ICD-10-CM | POA: Diagnosis not present

## 2022-09-04 DIAGNOSIS — M5432 Sciatica, left side: Secondary | ICD-10-CM | POA: Diagnosis not present

## 2022-09-04 DIAGNOSIS — M542 Cervicalgia: Secondary | ICD-10-CM | POA: Diagnosis not present

## 2022-09-04 DIAGNOSIS — M5431 Sciatica, right side: Secondary | ICD-10-CM | POA: Diagnosis not present

## 2022-09-04 DIAGNOSIS — R262 Difficulty in walking, not elsewhere classified: Secondary | ICD-10-CM | POA: Diagnosis not present

## 2022-09-04 DIAGNOSIS — M530 Cervicocranial syndrome: Secondary | ICD-10-CM | POA: Diagnosis not present

## 2022-09-06 DIAGNOSIS — M5432 Sciatica, left side: Secondary | ICD-10-CM | POA: Diagnosis not present

## 2022-09-06 DIAGNOSIS — M5431 Sciatica, right side: Secondary | ICD-10-CM | POA: Diagnosis not present

## 2022-09-06 DIAGNOSIS — M5451 Vertebrogenic low back pain: Secondary | ICD-10-CM | POA: Diagnosis not present

## 2022-09-06 DIAGNOSIS — M542 Cervicalgia: Secondary | ICD-10-CM | POA: Diagnosis not present

## 2022-09-06 DIAGNOSIS — M530 Cervicocranial syndrome: Secondary | ICD-10-CM | POA: Diagnosis not present

## 2022-09-06 DIAGNOSIS — R262 Difficulty in walking, not elsewhere classified: Secondary | ICD-10-CM | POA: Diagnosis not present

## 2022-09-11 DIAGNOSIS — R262 Difficulty in walking, not elsewhere classified: Secondary | ICD-10-CM | POA: Diagnosis not present

## 2022-09-11 DIAGNOSIS — M5451 Vertebrogenic low back pain: Secondary | ICD-10-CM | POA: Diagnosis not present

## 2022-09-11 DIAGNOSIS — M542 Cervicalgia: Secondary | ICD-10-CM | POA: Diagnosis not present

## 2022-09-11 DIAGNOSIS — M530 Cervicocranial syndrome: Secondary | ICD-10-CM | POA: Diagnosis not present

## 2022-09-11 DIAGNOSIS — M5431 Sciatica, right side: Secondary | ICD-10-CM | POA: Diagnosis not present

## 2022-09-11 DIAGNOSIS — M5432 Sciatica, left side: Secondary | ICD-10-CM | POA: Diagnosis not present

## 2022-09-13 DIAGNOSIS — M5431 Sciatica, right side: Secondary | ICD-10-CM | POA: Diagnosis not present

## 2022-09-13 DIAGNOSIS — M5451 Vertebrogenic low back pain: Secondary | ICD-10-CM | POA: Diagnosis not present

## 2022-09-13 DIAGNOSIS — M5432 Sciatica, left side: Secondary | ICD-10-CM | POA: Diagnosis not present

## 2022-09-13 DIAGNOSIS — R262 Difficulty in walking, not elsewhere classified: Secondary | ICD-10-CM | POA: Diagnosis not present

## 2022-09-13 DIAGNOSIS — M530 Cervicocranial syndrome: Secondary | ICD-10-CM | POA: Diagnosis not present

## 2022-09-13 DIAGNOSIS — M542 Cervicalgia: Secondary | ICD-10-CM | POA: Diagnosis not present

## 2022-09-14 ENCOUNTER — Ambulatory Visit: Payer: Medicare PPO

## 2022-09-14 DIAGNOSIS — M47812 Spondylosis without myelopathy or radiculopathy, cervical region: Secondary | ICD-10-CM | POA: Diagnosis not present

## 2022-09-19 ENCOUNTER — Encounter: Payer: Self-pay | Admitting: Family Medicine

## 2022-09-19 ENCOUNTER — Ambulatory Visit: Payer: Medicare PPO | Admitting: Family Medicine

## 2022-09-19 VITALS — BP 120/70 | HR 87 | Temp 97.4°F | Resp 16 | Ht 64.0 in | Wt 190.8 lb

## 2022-09-19 DIAGNOSIS — I10 Essential (primary) hypertension: Secondary | ICD-10-CM

## 2022-09-19 MED ORDER — HYDRALAZINE HCL 10 MG PO TABS: 10.0000 mg | ORAL_TABLET | Freq: Two times a day (BID) | ORAL | 1 refills | Status: DC | PRN

## 2022-09-19 NOTE — Progress Notes (Signed)
Name: Brittany Hill   MRN: [id number]    DOB: 1950/10/08   Date:09/19/2022       Progress Note  Chief Complaint  Patient presents with   Follow-up   Hypertension    Up and down readings     Subjective:   Brittany Hill is a 72 y.o. female, presents to clinic for f/up on BP  Recently seen by Junie Panning for comprehensive f/up OV - advised to return for 4 week BP recheck   Hypertension (Chronic)       Chronic, ongoing She is taking Metoprolol 50 mg PO QHS and Spironolactone 25 mg PO QD She reports concerns today for fluctuating BP with high elevations despite appropriate measuring practices at home She reports she was started on Spironolactone due to hypokalemia in the past  Recommend adding Amlodipine 2.5 mg PO QD to see if this stabilizes BP - may be able to dc one of the others if this provides adequate control Follow up in 4 weeks with daily BP record to see how she is responding     BP Readings from Last 3 Encounters:  09/19/22 120/70  08/21/22 114/70  02/16/22 (!) 104/58    Spironolactone Rx by cardiology - Jefm Bryant Last potassium 4.9 BB - no bradycardia Pulse Readings from Last 3 Encounters:  09/19/22 87  08/21/22 69  02/16/22 65   High BP readings and feeling badly after injection procedure and weather changes this weekend - she felt generally bad  With BP 180 and 170's had brief episode of CP but did not want to go to the ER  Sx resolved and the next day her back and neck pain is much better  BP is at goal today - in the past she has had too low of BP   Current Outpatient Medications:    acetaminophen (TYLENOL) 500 MG tablet, Take 1,000 mg by mouth every 6 (six) hours as needed., Disp: , Rfl:    amLODipine (NORVASC) 2.5 MG tablet, Take 1 tablet (2.5 mg total) by mouth daily., Disp: 30 tablet, Rfl: 1   cholecalciferol (VITAMIN D3) 25 MCG (1000 UNIT) tablet, Take 1,000 Units by mouth daily., Disp: , Rfl:    DULoxetine (CYMBALTA) 20 MG capsule, Take 2 CAPSULES  IN THE MORNING AND ONE IN THE EVENING., Disp: 270 capsule, Rfl: 2   EPINEPHrine 0.3 mg/0.3 mL IJ SOAJ injection, Inject 0.3 mg into the muscle once. , Disp: , Rfl:    ezetimibe-simvastatin (VYTORIN) 10-40 MG tablet, Take 1 tablet by mouth at bedtime., Disp: 90 tablet, Rfl: 3   famotidine (PEPCID) 20 MG tablet, Take 1 tablet (20 mg total) by mouth 2 (two) times daily as needed for heartburn or indigestion., Disp: 60 tablet, Rfl: 5   ipratropium (ATROVENT) 0.03 % nasal spray, Place 2 sprays into both nostrils every 12 (twelve) hours., Disp: 30 mL, Rfl: 12   levothyroxine (SYNTHROID) 75 MCG tablet, Take 1 tablet (75 mcg total) by mouth daily before breakfast., Disp: 90 tablet, Rfl: 0   meloxicam (MOBIC) 15 MG tablet, Take 1 tablet (15 mg total) by mouth daily., Disp: 90 tablet, Rfl: 0   metFORMIN (GLUCOPHAGE-XR) 500 MG 24 hr tablet, Take 1 tablet (500 mg total) by mouth 2 (two) times daily., Disp: 180 tablet, Rfl: 3   metoprolol succinate (TOPROL-XL) 50 MG 24 hr tablet, Take 1 tablet (50 mg total) by mouth at bedtime. Take with or immediately following a meal., Disp: 90 tablet, Rfl: 3   montelukast (SINGULAIR)  10 MG tablet, Take 1 tablet (10 mg total) by mouth at bedtime., Disp: 90 tablet, Rfl: 3   naloxone (NARCAN) nasal spray 4 mg/0.1 mL, Place into the nose., Disp: , Rfl:    ondansetron (ZOFRAN ODT) 4 MG disintegrating tablet, Take 1 tablet (4 mg total) by mouth every 8 (eight) hours as needed for nausea or vomiting., Disp: 20 tablet, Rfl: 1   oxycodone (OXY-IR) 5 MG capsule, Take 5 mg by mouth every 6 (six) hours as needed., Disp: , Rfl:    pantoprazole (PROTONIX) 40 MG tablet, Take 1 tablet (40 mg total) by mouth at bedtime., Disp: 90 tablet, Rfl: 0   pregabalin (LYRICA) 100 MG capsule, Take 1 capsule (100 mg total) by mouth in the morning AND 2 capsules (200 mg total) at bedtime., Disp: 180 capsule, Rfl: 0   spironolactone (ALDACTONE) 25 MG tablet, Take 1 tablet (25 mg total) by mouth daily. With  breakfast, Disp: 90 tablet, Rfl: 0   tiZANidine (ZANAFLEX) 4 MG tablet, Take 2 tablets (8 mg total) by mouth 3 (three) times daily., Disp: 540 tablet, Rfl: 1   albuterol (VENTOLIN HFA) 108 (90 Base) MCG/ACT inhaler, Inhale 2 puffs into the lungs every 6 (six) hours as needed for wheezing or shortness of breath. (Patient not taking: Reported on 09/19/2022), Disp: 18 g, Rfl: 0   calcium carbonate (OS-CAL) 600 MG TABS tablet, Take 600 mg by mouth as needed.  (Patient not taking: Reported on 09/19/2022), Disp: , Rfl:    fluticasone-salmeterol (ADVAIR) 100-50 MCG/ACT AEPB, Inhale 1 puff into the lungs 2 (two) times daily. (Patient not taking: Reported on 09/19/2022), Disp: 1 each, Rfl: 1  Patient Active Problem List   Diagnosis Date Noted   History of total knee replacement, bilateral 05/19/2022   At moderate risk for fall 05/19/2022   Syncope 01/17/2022   Dyslipidemia associated with type 2 diabetes mellitus ([place]) 07/12/2021   Acquired stenosis of bilateral nasolacrimal duct 02/08/2021   Type 2 diabetes mellitus without complication, without long-term current use of insulin ([place]) 03/22/2020   Acquired trigger finger 01/06/2020   Bouchard's nodes (with arthropathy) 01/06/2020   Chronic post-operative pain 09/05/2019   Neck pain 09/05/2019   Cervical radiculopathy 09/05/2019   Displacement of cervical intervertebral disc 05/14/2019   Fatty liver disease, nonalcoholic 0000000   Osteopenia 10/20/2018   Obesity (BMI 30.0-34.9) 09/27/2018   Cataract, left 04/02/2018   Osteoarthritis of left ankle 02/16/2018   Asthma without status asthmaticus 10/05/2017   Genetic testing 08/28/2017   Family hx-breast malignancy 08/02/2017   Dense breast tissue on mammogram 07/28/2016   Left sided sciatica 12/19/2015   Left lumbar radiculopathy 12/16/2015   Hypomagnesemia 12/06/2015   Hypokalemia 12/02/2015   Fibromyalgia    MS (multiple sclerosis) (HCC)    Chronic constipation    OA (osteoarthritis)     Hypothyroidism    IFG (impaired fasting glucose)    Hypertension    Hyperlipidemia    GERD (gastroesophageal reflux disease)    MDD (major depressive disorder), recurrent, in full remission ([place])    Anxiety    TMJ syndrome     Past Surgical History:  Procedure Laterality Date   ABDOMINAL HYSTERECTOMY     complete at age 56; HRT for ~25y   BACK SURGERY     CATARACT EXTRACTION W/PHACO Left 09/27/2017   Procedure: CATARACT EXTRACTION PHACO AND INTRAOCULAR LENS PLACEMENT ([place]);  Surgeon: Eulogio Bear, MD;  Location: [facility] ORS;  Service: Ophthalmology;  Laterality: Left;  Lot [id number]  [place]:   00:27.2 AP%:  13.7 CDE:  3.75   CATARACT EXTRACTION W/PHACO Right 10/25/2017   Procedure: CATARACT EXTRACTION PHACO AND INTRAOCULAR LENS PLACEMENT ([facility]);  Surgeon: Eulogio Bear, MD;  Location: [facility] ORS;  Service: Ophthalmology;  Laterality: Right;  Lot # [id number] H [place]:   00:23.9 AP%   :8.7 CDE:   2.10   Cataracts Bilateral    09/27/17 and 10/25/17   CHOLECYSTECTOMY  05/27/2020   COLONOSCOPY WITH ESOPHAGOGASTRODUODENOSCOPY (EGD)  12/2017   Endoscopy [place]   EYE SURGERY  2022   JOINT REPLACEMENT     KNEE ARTHROSCOPY Right    x 2   lumbarectomy with fusion  12/06/2015   ROTATOR CUFF REPAIR     SPINE SURGERY  2018?   tear duct      surgery   TMJ ARTHROPLASTY     TOTAL KNEE ARTHROPLASTY Bilateral 09/2007    Family History  Problem Relation Age of Onset   Heart disease Mother    Hypertension Mother    Lymphoma Mother 21       deceased 20   Arthritis Mother    Cancer Mother    Varicose Veins Mother    Stroke Father    Heart attack Father    Heart disease Father    Hypertension Father    Alcohol abuse Father    Early death Father    Hypertension Sister    Colon cancer Sister 67   Cancer Sister    52 / Stillbirths Sister    Hypertension Brother    Diabetes Brother    Mental illness Brother    Depression Brother    Lung cancer Brother 33       smoker;  currently 56   Cancer Brother    Learning disabilities Brother    Alcohol abuse Son    Breast cancer Maternal Aunt 75       currently 75   Breast cancer Maternal Aunt        dx 27s; mets in 63s   AAA (abdominal aortic aneurysm) Maternal Grandmother    Heart disease Maternal Grandmother    Heart attack Paternal Grandfather    Throat cancer Paternal Uncle        smoker    Social History   Tobacco Use   Smoking status: Never   Smokeless tobacco: Never  Vaping Use   Vaping Use: Never used  Substance Use Topics   Alcohol use: No   Drug use: No     Allergies  Allergen Reactions   Shellfish Allergy Rash and Swelling   Codeine Nausea And Vomiting   Sulfa Antibiotics Swelling    Health Maintenance  Topic Date Due   Medicare Annual Wellness (AWV)  09/13/2022   Diabetic kidney evaluation - Urine ACR  10/03/2022   COVID-19 Vaccine (6 - 2023-24 season) 10/05/2022 (Originally 04/07/2022)   INFLUENZA VACCINE  11/05/2022 (Originally 03/07/2022)   Zoster Vaccines- Shingrix (1 of 2) 11/20/2022 (Originally 05/09/1970)   FOOT EXAM  10/03/2022   DEXA SCAN  11/05/2022   MAMMOGRAM  11/23/2022   COLONOSCOPY (Pts 45-42yr Insurance coverage will need to be confirmed)  01/11/2023   OPHTHALMOLOGY EXAM  01/25/2023   HEMOGLOBIN A1C  02/19/2023   Diabetic kidney evaluation - eGFR measurement  08/22/2023   DTaP/Tdap/Td (3 - Td or Tdap) 03/27/2030   Pneumonia Vaccine 72 Years old  Completed   Hepatitis C Screening  Addressed   HPV VACCINES  Aged Out    Chart Review  Today: I personally reviewed active problem list, medication list, allergies, family history, social history, health maintenance, notes from last encounter, lab results, imaging with the patient/caregiver today.   Review of Systems  Constitutional: Negative.   HENT: Negative.    Eyes: Negative.   Respiratory: Negative.    Cardiovascular: Negative.   Gastrointestinal: Negative.   Endocrine: Negative.   Genitourinary: Negative.    Musculoskeletal: Negative.   Skin: Negative.   Allergic/Immunologic: Negative.   Neurological: Negative.   Hematological: Negative.   Psychiatric/Behavioral: Negative.    All other systems reviewed and are negative.    Objective:   Vitals:   09/19/22 1043  BP: 120/70  Pulse: 87  Resp: 16  Temp: (!) 97.4 F (36.3 C)  TempSrc: Oral  SpO2: 97%  Weight: 190 lb 12.8 oz (86.5 kg)  Height: 5' 4"$  (1.626 m)    Body mass index is 32.75 kg/m.  Physical Exam Vitals and nursing note reviewed.  Constitutional:      General: She is not in acute distress.    Appearance: Normal appearance. She is well-developed. She is not ill-appearing, toxic-appearing or diaphoretic.  HENT:     Head: Normocephalic and atraumatic.     Nose: Nose normal.  Eyes:     General:        Right eye: No discharge.        Left eye: No discharge.     Conjunctiva/sclera: Conjunctivae normal.  Neck:     Trachea: No tracheal deviation.  Cardiovascular:     Rate and Rhythm: Normal rate and regular rhythm.     Pulses: Normal pulses.     Heart sounds: Normal heart sounds.  Pulmonary:     Effort: Pulmonary effort is normal. No respiratory distress.     Breath sounds: No stridor.  Musculoskeletal:        General: Normal range of motion.  Skin:    General: Skin is warm and dry.     Findings: No rash.  Neurological:     Mental Status: She is alert.     Motor: No abnormal muscle tone.     Coordination: Coordination normal.  Psychiatric:        Behavior: Behavior normal.         Assessment & Plan:   Problem List Items Addressed This Visit       Cardiovascular and Mediastinum   Hypertension - Primary (Chronic)    Bp at goal today, she is only having high readings at home associated with severe pain and procedures. She is on spironolactone per cardiology D/c norvasc Any prolonged high BP readings she can use hydralazine prn - but I encouraged her not to worry about higher BP when having severe  pain or procedures as long as not having sx concerning for events or end organ damage (which I reviewed sx/signs at length with her today)      Relevant Medications   hydrALAZINE (APRESOLINE) 10 MG tablet    Pt will stop amlodipine and continue her other meds and f/up with cardiology If she has any other episodes of severe pain associated by very elevated BP that does not improve after resting and managing acute on chronic pain flares- she can try PRN doses of hydralazine    Return for July routine f/up.   Delsa Grana, PA-C 09/19/22 11:03 AM

## 2022-09-19 NOTE — Patient Instructions (Signed)
Pt will stop amlodipine and continue her other meds and f/up with cardiology If she has any other episodes of severe pain associated by very elevated BP that does not improve after resting and managing acute on chronic pain flares- she can try PRN doses of hydralazine

## 2022-09-20 DIAGNOSIS — M5431 Sciatica, right side: Secondary | ICD-10-CM | POA: Diagnosis not present

## 2022-09-20 DIAGNOSIS — M542 Cervicalgia: Secondary | ICD-10-CM | POA: Diagnosis not present

## 2022-09-20 DIAGNOSIS — M5451 Vertebrogenic low back pain: Secondary | ICD-10-CM | POA: Diagnosis not present

## 2022-09-20 DIAGNOSIS — R262 Difficulty in walking, not elsewhere classified: Secondary | ICD-10-CM | POA: Diagnosis not present

## 2022-09-20 DIAGNOSIS — M5432 Sciatica, left side: Secondary | ICD-10-CM | POA: Diagnosis not present

## 2022-09-20 DIAGNOSIS — M530 Cervicocranial syndrome: Secondary | ICD-10-CM | POA: Diagnosis not present

## 2022-09-22 DIAGNOSIS — Z79891 Long term (current) use of opiate analgesic: Secondary | ICD-10-CM | POA: Diagnosis not present

## 2022-09-22 DIAGNOSIS — M5432 Sciatica, left side: Secondary | ICD-10-CM | POA: Diagnosis not present

## 2022-09-22 DIAGNOSIS — M5431 Sciatica, right side: Secondary | ICD-10-CM | POA: Diagnosis not present

## 2022-09-22 DIAGNOSIS — M530 Cervicocranial syndrome: Secondary | ICD-10-CM | POA: Diagnosis not present

## 2022-09-22 DIAGNOSIS — Z981 Arthrodesis status: Secondary | ICD-10-CM | POA: Diagnosis not present

## 2022-09-22 DIAGNOSIS — Z5181 Encounter for therapeutic drug level monitoring: Secondary | ICD-10-CM | POA: Diagnosis not present

## 2022-09-22 DIAGNOSIS — M5451 Vertebrogenic low back pain: Secondary | ICD-10-CM | POA: Diagnosis not present

## 2022-09-22 DIAGNOSIS — M542 Cervicalgia: Secondary | ICD-10-CM | POA: Diagnosis not present

## 2022-09-22 DIAGNOSIS — M7061 Trochanteric bursitis, right hip: Secondary | ICD-10-CM | POA: Diagnosis not present

## 2022-09-22 DIAGNOSIS — R262 Difficulty in walking, not elsewhere classified: Secondary | ICD-10-CM | POA: Diagnosis not present

## 2022-09-22 DIAGNOSIS — G8928 Other chronic postprocedural pain: Secondary | ICD-10-CM | POA: Diagnosis not present

## 2022-09-22 DIAGNOSIS — M25551 Pain in right hip: Secondary | ICD-10-CM | POA: Diagnosis not present

## 2022-09-22 DIAGNOSIS — G8929 Other chronic pain: Secondary | ICD-10-CM | POA: Diagnosis not present

## 2022-09-25 NOTE — Assessment & Plan Note (Signed)
Bp at goal today, she is only having high readings at home associated with severe pain and procedures. She is on spironolactone per cardiology D/c norvasc Any prolonged high BP readings she can use hydralazine prn - but I encouraged her not to worry about higher BP when having severe pain or procedures as long as not having sx concerning for events or end organ damage (which I reviewed sx/signs at length with her today)

## 2022-09-26 DIAGNOSIS — M47812 Spondylosis without myelopathy or radiculopathy, cervical region: Secondary | ICD-10-CM | POA: Diagnosis not present

## 2022-09-28 ENCOUNTER — Other Ambulatory Visit: Payer: Self-pay | Admitting: Family Medicine

## 2022-09-28 DIAGNOSIS — K219 Gastro-esophageal reflux disease without esophagitis: Secondary | ICD-10-CM

## 2022-09-29 DIAGNOSIS — M530 Cervicocranial syndrome: Secondary | ICD-10-CM | POA: Diagnosis not present

## 2022-09-29 DIAGNOSIS — M5451 Vertebrogenic low back pain: Secondary | ICD-10-CM | POA: Diagnosis not present

## 2022-09-29 DIAGNOSIS — M5432 Sciatica, left side: Secondary | ICD-10-CM | POA: Diagnosis not present

## 2022-09-29 DIAGNOSIS — R262 Difficulty in walking, not elsewhere classified: Secondary | ICD-10-CM | POA: Diagnosis not present

## 2022-09-29 DIAGNOSIS — M5431 Sciatica, right side: Secondary | ICD-10-CM | POA: Diagnosis not present

## 2022-09-29 DIAGNOSIS — M542 Cervicalgia: Secondary | ICD-10-CM | POA: Diagnosis not present

## 2022-10-02 DIAGNOSIS — M5451 Vertebrogenic low back pain: Secondary | ICD-10-CM | POA: Diagnosis not present

## 2022-10-02 DIAGNOSIS — M5432 Sciatica, left side: Secondary | ICD-10-CM | POA: Diagnosis not present

## 2022-10-02 DIAGNOSIS — M530 Cervicocranial syndrome: Secondary | ICD-10-CM | POA: Diagnosis not present

## 2022-10-02 DIAGNOSIS — M5431 Sciatica, right side: Secondary | ICD-10-CM | POA: Diagnosis not present

## 2022-10-02 DIAGNOSIS — M542 Cervicalgia: Secondary | ICD-10-CM | POA: Diagnosis not present

## 2022-10-02 DIAGNOSIS — R262 Difficulty in walking, not elsewhere classified: Secondary | ICD-10-CM | POA: Diagnosis not present

## 2022-10-02 NOTE — Progress Notes (Signed)
  Care Coordination   Note   10/02/2022 Name: Brittany Hill MRN: [id number] DOB: 1951-07-25  Brittany Hill is a 72 y.o. year old female who sees Delsa Grana, [place] for primary care. I reached out to Chales Abrahams by phone today to offer care coordination services.  [name] was given information about Care Coordination services today including:   The Care Coordination services include support from the care team which includes your Nurse Coordinator, Clinical Social Worker, or Pharmacist.  The Care Coordination team is here to help remove barriers to the health concerns and goals most important to you. Care Coordination services are voluntary, and the patient may decline or stop services at any time by request to their care team member.   Care Coordination Consent Status: Patient did not agree to participate in care coordination services at this time.  Follow up plan:  none indicated   Encounter Outcome:  Pt. Refused  Julian Hy, [place] Direct Dial: [phone]

## 2022-10-04 DIAGNOSIS — M5432 Sciatica, left side: Secondary | ICD-10-CM | POA: Diagnosis not present

## 2022-10-04 DIAGNOSIS — M530 Cervicocranial syndrome: Secondary | ICD-10-CM | POA: Diagnosis not present

## 2022-10-04 DIAGNOSIS — M5451 Vertebrogenic low back pain: Secondary | ICD-10-CM | POA: Diagnosis not present

## 2022-10-04 DIAGNOSIS — M5431 Sciatica, right side: Secondary | ICD-10-CM | POA: Diagnosis not present

## 2022-10-04 DIAGNOSIS — M542 Cervicalgia: Secondary | ICD-10-CM | POA: Diagnosis not present

## 2022-10-04 DIAGNOSIS — R262 Difficulty in walking, not elsewhere classified: Secondary | ICD-10-CM | POA: Diagnosis not present

## 2022-10-05 ENCOUNTER — Ambulatory Visit (INDEPENDENT_AMBULATORY_CARE_PROVIDER_SITE_OTHER): Payer: Medicare PPO

## 2022-10-05 VITALS — Ht 64.0 in | Wt 190.0 lb

## 2022-10-05 DIAGNOSIS — Z0000 Encounter for general adult medical examination without abnormal findings: Secondary | ICD-10-CM

## 2022-10-05 DIAGNOSIS — Z1211 Encounter for screening for malignant neoplasm of colon: Secondary | ICD-10-CM

## 2022-10-05 DIAGNOSIS — Z13820 Encounter for screening for osteoporosis: Secondary | ICD-10-CM

## 2022-10-05 DIAGNOSIS — Z1231 Encounter for screening mammogram for malignant neoplasm of breast: Secondary | ICD-10-CM

## 2022-10-05 NOTE — Progress Notes (Signed)
I connected with  Chales Abrahams on 10/05/22 by a audio enabled telemedicine application and verified that I am speaking with the correct person using two identifiers.  Patient Location: Home  Provider Location: Office/Clinic  I discussed the limitations of evaluation and management by telemedicine. The patient expressed understanding and agreed to proceed.  Subjective:   Brittany Hill is a 72 y.o. female who presents for Medicare Annual (Subsequent) preventive examination.  Review of Systems    Cardiac Risk Factors include: advanced age (>61mn, >>28women);diabetes mellitus;dyslipidemia;hypertension;obesity (BMI >30kg/m2);sedentary lifestyle     Objective:    Today's Vitals   10/05/22 0952 10/05/22 0953  Weight: 190 lb (86.2 kg)   Height: '5\' 4"'$  (1.626 m)   PainSc:  4    Body mass index is 32.61 kg/m.     10/05/2022   10:14 AM 09/13/2021    3:41 PM 09/09/2020    3:55 PM 08/14/2019   10:21 AM 08/06/2018   11:45 AM 10/25/2017    7:43 AM 09/27/2017    6:45 AM  Advanced Directives  Does Patient Have a Medical Advance Directive? Yes Yes Yes Yes Yes Yes Yes  Type of AParamed[place]of [place]Living will [place]Living will [place]Living will [place]SylvanLiving will [place]Living will HSanta CruzLiving will HBeckettLiving will  Does patient want to make changes to medical advance directive?      No - Patient declined No - Patient declined  Copy of HH[place] Chart? No - copy requested No - copy requested No - copy requested No - copy requested No - copy requested No - copy requested No - copy requested    Current Medications (verified) Outpatient Encounter Medications as of 10/05/2022  Medication Sig   acetaminophen (TYLENOL) 500 MG tablet Take 1,000 mg by mouth every 6 (six) hours as needed.   albuterol (VENTOLIN HFA)  108 (90 Base) MCG/ACT inhaler Inhale 2 puffs into the lungs every 6 (six) hours as needed for wheezing or shortness of breath.   cholecalciferol (VITAMIN D3) 25 MCG (1000 UNIT) tablet Take 1,000 Units by mouth daily.   DULoxetine (CYMBALTA) 20 MG capsule Take 2 CAPSULES IN THE MORNING AND ONE IN THE EVENING.   EPINEPHrine 0.3 mg/0.3 mL IJ SOAJ injection Inject 0.3 mg into the muscle once.    ezetimibe-simvastatin (VYTORIN) 10-40 MG tablet Take 1 tablet by mouth at bedtime.   famotidine (PEPCID) 20 MG tablet Take 1 tablet (20 mg total) by mouth 2 (two) times daily as needed for heartburn or indigestion.   hydrALAZINE (APRESOLINE) 10 MG tablet Take 1 tablet (10 mg total) by mouth 2 (two) times daily as needed (take for sustained BP >160/100).   ipratropium (ATROVENT) 0.03 % nasal spray Place 2 sprays into both nostrils every 12 (twelve) hours.   levothyroxine (SYNTHROID) 75 MCG tablet Take 1 tablet (75 mcg total) by mouth daily before breakfast.   meloxicam (MOBIC) 15 MG tablet Take 1 tablet (15 mg total) by mouth daily.   metFORMIN (GLUCOPHAGE-XR) 500 MG 24 hr tablet Take 1 tablet (500 mg total) by mouth 2 (two) times daily.   metoprolol succinate (TOPROL-XL) 50 MG 24 hr tablet Take 1 tablet (50 mg total) by mouth at bedtime. Take with or immediately following a meal.   montelukast (SINGULAIR) 10 MG tablet Take 1 tablet (10 mg total) by mouth at bedtime.   naloxone ([facility] nasal  spray 4 mg/0.1 mL Place into the nose.   oxycodone (OXY-IR) 5 MG capsule Take 5 mg by mouth every 6 (six) hours as needed.   pantoprazole (PROTONIX) 40 MG tablet Take 1 tablet (40 mg total) by mouth at bedtime.   pregabalin (LYRICA) 100 MG capsule Take 1 capsule (100 mg total) by mouth in the morning AND 2 capsules (200 mg total) at bedtime.   spironolactone (ALDACTONE) 25 MG tablet Take 1 tablet (25 mg total) by mouth daily. With breakfast   tiZANidine (ZANAFLEX) 4 MG tablet Take 2 tablets (8 mg total) by mouth 3 (three)  times daily.   calcium carbonate (OS-CAL) 600 MG TABS tablet Take 600 mg by mouth as needed.  (Patient not taking: Reported on 09/19/2022)   fluticasone-salmeterol (ADVAIR) 100-50 MCG/ACT AEPB Inhale 1 puff into the lungs 2 (two) times daily. (Patient not taking: Reported on 09/19/2022)   ondansetron (ZOFRAN ODT) 4 MG disintegrating tablet Take 1 tablet (4 mg total) by mouth every 8 (eight) hours as needed for nausea or vomiting.   No facility-administered encounter medications on file as of 10/05/2022.    Allergies (verified) Shellfish allergy, Codeine, and Sulfa antibiotics   History: Past Medical History:  Diagnosis Date   Allergy    Anxiety    Asthma    as a teenager   Blood transfusion without reported diagnosis 2009   My own blood   Cataract    Chronic constipation    Complication of anesthesia    Depression    Dysrhythmia    Fibromyalgia    Bells Palsy   Full code status 08/02/2017   Patient wishes to have resuscitative efforts made if collapses; but does not want to remain on life support or if condition is terminal -- see copy of living will   Genetic testing 08/28/2017   Multi-Cancer panel (83 genes) @ [facility] - No pathogenic mutations detected   GERD (gastroesophageal reflux disease)    Heart murmur    Hyperlipidemia    Hypertension    Hypothyroidism    IFG (impaired fasting glucose)    MS (multiple sclerosis) (HCC)    hx of long-term corticosteroid use   OA (osteoarthritis)    osteoporosis   Osteoporosis 05/2014   managed by Rheumatologist   Pneumonia 2017   PONV (postoperative nausea and vomiting)    TMJ syndrome    Past Surgical History:  Procedure Laterality Date   ABDOMINAL HYSTERECTOMY     complete at age 73; HRT for ~25y   BACK SURGERY     CATARACT EXTRACTION W/PHACO Left 09/27/2017   Procedure: CATARACT EXTRACTION PHACO AND INTRAOCULAR LENS PLACEMENT ([place]);  Surgeon: Eulogio Bear, MD;  Location: [facility] ORS;  Service: Ophthalmology;  Laterality:  Left;  Lot [id number] H [place]:   00:27.2 AP%:  13.7 CDE:  3.75   CATARACT EXTRACTION W/PHACO Right 10/25/2017   Procedure: CATARACT EXTRACTION PHACO AND INTRAOCULAR LENS PLACEMENT (IOC);  Surgeon: Eulogio Bear, MD;  Location: [facility] ORS;  Service: Ophthalmology;  Laterality: Right;  Lot # [id number] H [place]:   00:23.9 AP%   :8.7 CDE:   2.10   Cataracts Bilateral    09/27/17 and 10/25/17   CHOLECYSTECTOMY  05/27/2020   COLONOSCOPY WITH ESOPHAGOGASTRODUODENOSCOPY (EGD)  12/2017   Endoscopy [place]   EYE SURGERY  2022   JOINT REPLACEMENT     KNEE ARTHROSCOPY Right    x 2   lumbarectomy with fusion  12/06/2015   ROTATOR CUFF REPAIR  SPINE SURGERY  2018?   tear duct      surgery   TMJ ARTHROPLASTY     TOTAL KNEE ARTHROPLASTY Bilateral 09/2007   Family History  Problem Relation Age of Onset   Heart disease Mother    Hypertension Mother    Lymphoma Mother 69       deceased 34   Arthritis Mother    Cancer Mother    Varicose Veins Mother    Stroke Father    Heart attack Father    Heart disease Father    Hypertension Father    Alcohol abuse Father    Early death Father    Hypertension Sister    Colon cancer Sister 60   Cancer Sister    25 / Stillbirths Sister    Hypertension Brother    Diabetes Brother    Mental illness Brother    Depression Brother    Lung cancer Brother 36       smoker; currently 36   Cancer Brother    Learning disabilities Brother    Alcohol abuse Son    Breast cancer Maternal Aunt 75       currently 15   Breast cancer Maternal Aunt        dx 74s; mets in 87s   AAA (abdominal aortic aneurysm) Maternal Grandmother    Heart disease Maternal Grandmother    Heart attack Paternal Grandfather    Throat cancer Paternal Uncle        smoker   Social History   Socioeconomic History   Marital status: Married    Spouse name: Sonia Side   Number of children: 2   Years of education: Not on file   Highest education level: Master's degree (e.g., MA,  MS, MEng, MEd, MSW, MBA)  Occupational History   Occupation: retired  Tobacco Use   Smoking status: Never   Smokeless tobacco: Never  Vaping Use   Vaping Use: Never used  Substance and Sexual Activity   Alcohol use: No   Drug use: No   Sexual activity: Not Currently    Birth control/protection: None  Other Topics Concern   Not on file  Social History Narrative   Not on file   Social Determinants of Health   Financial Resource Strain: Low Risk  (10/05/2022)   Overall Financial Resource Strain (CARDIA)    Difficulty of Paying Living Expenses: Not hard at all  Food Insecurity: No Food Insecurity (10/05/2022)   Hunger Vital Sign    Worried About Running Out of Food in the Last Year: Never true    Ran Out of Food in the Last Year: Never true  Transportation Needs: No Transportation Needs (10/05/2022)   PRAPARE - Hydrologist (Medical): No    Lack of Transportation (Non-Medical): No  Physical Activity: Insufficiently Active (10/05/2022)   Exercise Vital Sign    Days of Exercise per Week: 2 days    Minutes of Exercise per Session: 60 min  Stress: Stress Concern Present (10/05/2022)   [place]    Feeling of Stress : Very much  Social Connections: Moderately Integrated (10/05/2022)   Social Connection and Isolation Panel [NHANES]    Frequency of Communication with Friends and Family: More than three times a week    Frequency of Social Gatherings with Friends and Family: More than three times a week    Attends Religious Services: More than 4 times per year  Active Member of Clubs or Organizations: No    Attends Archivist Meetings: Never    Marital Status: Married    Tobacco Counseling Counseling given: Not Answered   Clinical Intake:  Pre-visit preparation completed: Yes  Pain : 0-10 Pain Score: 4  Pain Type: Chronic pain Pain Location: Neck Pain Orientation:  Posterior Pain Descriptors / Indicators: Aching, Spasm, Tightness Pain Onset: More than a month ago Pain Frequency: Intermittent Pain Relieving Factors: medication,Tylenol and muscle relaxlers,heat  Pain Relieving Factors: medication,Tylenol and muscle relaxlers,heat  BMI - recorded: 32.61 Nutritional Status: BMI > 30  Obese Nutritional Risks: None Diabetes: Yes (pt says pre-daibetes)  How often do you need to have someone help you when you read instructions, pamphlets, or other written materials from your doctor or pharmacy?: 1 - Never  Diabetic?no  Interpreter Needed?: No  Information entered by :: B.Oshea Percival,LPN   Activities of Daily Living    10/05/2022   10:15 AM 09/19/2022   10:43 AM  In your present state of health, do you have any difficulty performing the following activities:  Hearing? 0 0  Vision? 0 0  Difficulty concentrating or making decisions? 0 0  Walking or climbing stairs? 1 0  Dressing or bathing? 0 0  Doing errands, shopping? 0 0  Preparing Food and eating ? N   Using the Toilet? N   In the past six months, have you accidently leaked urine? N   Do you have problems with loss of bowel control? N   Managing your Medications? N   Managing your Finances? N   Housekeeping or managing your Housekeeping? N     Patient Care Team: Delsa Grana, PA-C as PCP - General (Family Medicine) Maryellen Pile, MD as Referring Physician (Neurosurgery) Ubaldo Glassing Javier Docker, MD as Consulting Physician (Cardiology) Tendler, Rayvon Char (Inactive) as Referring Physician Arnell Asal, MD as Referring Physician (Neurosurgery) Wyn Quaker, MD as Referring Physician (Anesthesiology)  Indicate any recent Medical Services you may have received from other than [facility] providers in the past year (date may be approximate).     Assessment:   This is a routine wellness examination for Niva.  Hearing/Vision screen Hearing Screening - Comments:: Adequate hearing Vision  Screening - Comments:: Adequate vision;only readers [facility]  Dietary issues and exercise activities discussed: Current Exercise Habits: Structured exercise class;Home exercise routine (PT 2xweekly:does exercises at home), Type of exercise: stretching (PT 2day/week), Time (Minutes): 30, Frequency (Times/Week): 2, Weekly Exercise (Minutes/Week): 60, Intensity: Mild, Exercise limited by: neurologic condition(s);orthopedic condition(s)   Goals Addressed   None    Depression Screen    10/05/2022   10:05 AM 09/19/2022   10:43 AM 08/21/2022    9:23 AM 05/19/2022    1:19 PM 02/16/2022    9:23 AM 01/17/2022    2:19 PM 12/30/2021    9:14 AM  PHQ 2/9 Scores  PHQ - 2 Score 2 0 0 0 0 0 1  PHQ- 9 Score 2 0 0 0 0  1    Fall Risk    10/05/2022    9:58 AM 09/19/2022   10:43 AM 08/21/2022    9:23 AM 05/19/2022    1:18 PM 02/16/2022    9:23 AM  Fall Risk   Falls in the past year? '1 1 1 1 1  '$ Number falls in past yr: 0 0 0 0 0  Injury with Fall? 0 '1 1 1 1  '$ Comment hurt left thumb      Risk for fall due  to : No Fall Risks Impaired balance/gait Impaired balance/gait Impaired balance/gait;History of fall(s);Impaired mobility Impaired balance/gait  Follow up Education provided;Falls prevention discussed Falls prevention discussed;Education provided;Falls evaluation completed Falls prevention discussed;Education provided;Falls evaluation completed Falls evaluation completed;Education provided;Falls prevention discussed Falls prevention discussed;Education provided    FALL RISK PREVENTION PERTAINING TO THE HOME:  Any stairs in or around the home? Yes  If so, are there any without handrails? Yes  Home free of loose throw rugs in walkways, pet beds, electrical cords, etc? Yes  Adequate lighting in your home to reduce risk of falls? Yes   ASSISTIVE DEVICES UTILIZED TO PREVENT FALLS:  Life alert? No  Use of a cane, walker or w/c? No  Grab bars in the bathroom? Yes  Shower chair or bench in shower?  Yes  Elevated toilet seat or a handicapped toilet? Yes    Cognitive Function:        10/05/2022   10:18 AM 08/06/2018   11:52 AM  6CIT Screen  What Year? 0 points 0 points  What month? 0 points 0 points  What time? 0 points 0 points  Count back from 20 0 points 0 points  Months in reverse 0 points 0 points  Repeat phrase 0 points 0 points  Total Score 0 points 0 points    Immunizations Immunization History  Administered Date(s) Administered   Fluad Quad(high Dose 65+) 05/02/2019, 05/17/2020, 05/16/2021   Influenza, High Dose Seasonal PF 05/16/2018, 05/07/2021   Influenza,inj,Quad PF,6+ Mos 06/03/2015   Influenza-Unspecified 05/17/2012, 05/10/2017   PFIZER(Purple Top)SARS-COV-2 Vaccination 08/26/2019, 09/17/2019, 04/18/2020, 02/01/2021   PNEUMOCOCCAL CONJUGATE-20 08/21/2022   Pfizer Covid-19 Vaccine Bivalent Booster 26yr & up 08/02/2021   Pneumococcal Polysaccharide-23 08/02/2017   Rsv, Bivalent, Protein Subunit Rsvpref,pf (Evans Lance 07/19/2022   Td 06/19/1999   Tdap 03/27/2020   Zoster, Live 08/08/2011    TDAP status: Up to date  Flu Vaccine status: Up to date  Pneumococcal vaccine: YES  Covid-19 vaccine status: Completed vaccines  Qualifies for Shingles Vaccine? Yes   Zostavax completed Yes   Shingrix Completed?: Yes  Screening Tests Health Maintenance  Topic Date Due   Diabetic kidney evaluation - Urine ACR  10/03/2022   FOOT EXAM  10/03/2022   COVID-19 Vaccine (6 - 2023-24 season) 10/05/2022 (Originally 04/07/2022)   INFLUENZA VACCINE  11/05/2022 (Originally 03/07/2022)   Zoster Vaccines- Shingrix (1 of 2) 11/20/2022 (Originally 05/09/1970)   DEXA SCAN  11/05/2022   MAMMOGRAM  11/23/2022   COLONOSCOPY (Pts 45-491yrInsurance coverage will need to be confirmed)  01/11/2023   OPHTHALMOLOGY EXAM  01/25/2023   HEMOGLOBIN A1C  02/19/2023   Diabetic kidney evaluation - eGFR measurement  08/22/2023   Medicare Annual Wellness (AWV)  10/05/2023   DTaP/Tdap/Td (3 -  Td or Tdap) 03/27/2030   Pneumonia Vaccine [age]Years old  Completed   Hepatitis C Screening  Addressed   HPV VACCINES  Aged Out    Health Maintenance  Health Maintenance Due  Topic Date Due   Diabetic kidney evaluation - Urine ACR  10/03/2022   FOOT EXAM  10/03/2022    Colorectal cancer screening: Type of screening: Colonoscopy. Completed yes. Repeat every 5 years [id number] TeComer LocketMammogram status: Ordered yes. Pt provided with contact info and advised to call to schedule appt.  Due April 2024  Bone Density status: Completed yes. Results reflect: Bone density results: OSTEOPENIA. Repeat every 3 years.  Lung Cancer Screening: (Low Dose CT Chest recommended if Age 72-80ears, 30 pack-year currently smoking  OR have quit w/in 15years.) does not qualify.   Lung Cancer Screening Referral: no  Additional Screening:  Hepatitis C Screening: does not qualify; Completed no  Vision Screening: Recommended annual ophthalmology exams for early detection of glaucoma and other disorders of the eye. Is the patient up to date with their annual eye exam?  Yes  Who is the provider or what is the name of the office in which the patient attends annual eye exams? [place] If pt is not established with a provider, would they like to be referred to a provider to establish care? No .   Dental Screening: Recommended annual dental exams for proper oral hygiene  Community Resource Referral / Chronic Care Management: CRR required this visit?  No   CCM required this visit?  No      Plan:     I have personally reviewed and noted the following in the patient's chart:   Medical and social history Use of alcohol, tobacco or illicit drugs  Current medications and supplements including opioid prescriptions. Patient is currently taking opioid prescriptions. Information provided to patient regarding non-opioid alternatives. Patient advised to discuss non-opioid treatment plan with their  provider. Functional ability and status Nutritional status Physical activity Advanced directives List of other physicians Hospitalizations, surgeries, and ER visits in previous 12 months Vitals Screenings to include cognitive, depression, and falls Referrals and appointments  In addition, I have reviewed and discussed with patient certain preventive protocols, quality metrics, and best practice recommendations. A written personalized care plan for preventive services as well as general preventive health recommendations were provided to patient.     Roger Shelter, LPN   [id number]   Nurse Notes: pleasant pt who states she is doing well at her current baseline of managing pain. She continues PT 2xweekly and does home exercises given by PT. Pt has no questions or concerns at this time.   MMG, Dexa Scan ordered and referral to gastroenterology for colonoscopy.

## 2022-10-05 NOTE — Patient Instructions (Addendum)
Brittany Hill , Thank you for taking time to come for your Medicare Wellness Visit. I appreciate your ongoing commitment to your health goals. Please review the following plan we discussed and let me know if I can assist you in the future.   These are the goals we discussed:  Goals      Weight (lb) < 185 lb (83.9 kg)     Over the next year        This is a list of the screening recommended for you and due dates:  Health Maintenance  Topic Date Due   Yearly kidney health urinalysis for diabetes  10/03/2022   Complete foot exam   10/03/2022   COVID-19 Vaccine (6 - 2023-24 season) 10/05/2022*   Flu Shot  11/05/2022*   Zoster (Shingles) Vaccine (1 of 2) 11/20/2022*   DEXA scan (bone density measurement)  11/05/2022   Mammogram  11/23/2022   Colon Cancer Screening  01/11/2023   Eye exam for diabetics  01/25/2023   Hemoglobin A1C  02/19/2023   Yearly kidney function blood test for diabetes  08/22/2023   Medicare Annual Wellness Visit  10/05/2023   DTaP/Tdap/Td vaccine (3 - Td or Tdap) 03/27/2030   Pneumonia Vaccine  Completed   Hepatitis C Screening: USPSTF Recommendation to screen - Ages 71-79 yo.  Addressed   HPV Vaccine  Aged Out  *Topic was postponed. The date shown is not the original due date.    Advanced directives: yes  Conditions/risks identified: low falls risk  Next appointment: Follow up in one year for your annual wellness visit 10/11/2023 '@9'$ :45am telephone   Preventive Care 65 Years and Older, Female Preventive care refers to lifestyle choices and visits with your health care provider that can promote health and wellness. What does preventive care include? A yearly physical exam. This is also called an annual well check. Dental exams once or twice a year. Routine eye exams. Ask your health care provider how often you should have your eyes checked. Personal lifestyle choices, including: Daily care of your teeth and gums. Regular physical activity. Eating a  healthy diet. Avoiding tobacco and drug use. Limiting alcohol use. Practicing safe sex. Taking low-dose aspirin every day. Taking vitamin and mineral supplements as recommended by your health care provider. What happens during an annual well check? The services and screenings done by your health care provider during your annual well check will depend on your age, overall health, lifestyle risk factors, and family history of disease. Counseling  Your health care provider may ask you questions about your: Alcohol use. Tobacco use. Drug use. Emotional well-being. Home and relationship well-being. Sexual activity. Eating habits. History of falls. Memory and ability to understand (cognition). Work and work Statistician. Reproductive health. Screening  You may have the following tests or measurements: Height, weight, and BMI. Blood pressure. Lipid and cholesterol levels. These may be checked every 5 years, or more frequently if you are over 99 years old. Skin check. Lung cancer screening. You may have this screening every year starting at age 66 if you have a 30-pack-year history of smoking and currently smoke or have quit within the past 15 years. Fecal occult blood test (FOBT) of the stool. You may have this test every year starting at age 55. Flexible sigmoidoscopy or colonoscopy. You may have a sigmoidoscopy every 5 years or a colonoscopy every 10 years starting at age 48. Hepatitis C blood test. Hepatitis B blood test. Sexually transmitted disease (STD) testing. Diabetes screening. This is  done by checking your blood sugar (glucose) after you have not eaten for a while (fasting). You may have this done every 1-3 years. Bone density scan. This is done to screen for osteoporosis. You may have this done starting at age 52. Mammogram. This may be done every 1-2 years. Talk to your health care provider about how often you should have regular mammograms. Talk with your health care  provider about your test results, treatment options, and if necessary, the need for more tests. Vaccines  Your health care provider may recommend certain vaccines, such as: Influenza vaccine. This is recommended every year. Tetanus, diphtheria, and acellular pertussis (Tdap, Td) vaccine. You may need a Td booster every 10 years. Zoster vaccine. You may need this after age 24. Pneumococcal 13-valent conjugate (PCV13) vaccine. One dose is recommended after age 63. Pneumococcal polysaccharide (PPSV23) vaccine. One dose is recommended after age 39. Talk to your health care provider about which screenings and vaccines you need and how often you need them. This information is not intended to replace advice given to you by your health care provider. Make sure you discuss any questions you have with your health care provider. Document Released: 08/20/2015 Document Revised: 04/12/2016 Document Reviewed: 05/25/2015 Elsevier Interactive Patient Education  2017 [place] Prevention in the Home Falls can cause injuries. They can happen to people of all ages. There are many things you can do to make your home safe and to help prevent falls. What can I do on the outside of my home? Regularly fix the edges of walkways and driveways and fix any cracks. Remove anything that might make you trip as you walk through a door, such as a raised step or threshold. Trim any bushes or trees on the path to your home. Use bright outdoor lighting. Clear any walking paths of anything that might make someone trip, such as rocks or tools. Regularly check to see if handrails are loose or broken. Make sure that both sides of any steps have handrails. Any raised decks and porches should have guardrails on the edges. Have any leaves, snow, or ice cleared regularly. Use sand or salt on walking paths during winter. Clean up any spills in your garage right away. This includes oil or grease spills. What can I do in the  bathroom? Use night lights. Install grab bars by the toilet and in the tub and shower. Do not use towel bars as grab bars. Use non-skid mats or decals in the tub or shower. If you need to sit down in the shower, use a plastic, non-slip stool. Keep the floor dry. Clean up any water that spills on the floor as soon as it happens. Remove soap buildup in the tub or shower regularly. Attach bath mats securely with double-sided non-slip rug tape. Do not have throw rugs and other things on the floor that can make you trip. What can I do in the bedroom? Use night lights. Make sure that you have a light by your bed that is easy to reach. Do not use any sheets or blankets that are too big for your bed. They should not hang down onto the floor. Have a firm chair that has side arms. You can use this for support while you get dressed. Do not have throw rugs and other things on the floor that can make you trip. What can I do in the kitchen? Clean up any spills right away. Avoid walking on wet floors. Keep items that you  use a lot in easy-to-reach places. If you need to reach something above you, use a strong step stool that has a grab bar. Keep electrical cords out of the way. Do not use floor polish or wax that makes floors slippery. If you must use wax, use non-skid floor wax. Do not have throw rugs and other things on the floor that can make you trip. What can I do with my stairs? Do not leave any items on the stairs. Make sure that there are handrails on both sides of the stairs and use them. Fix handrails that are broken or loose. Make sure that handrails are as long as the stairways. Check any carpeting to make sure that it is firmly attached to the stairs. Fix any carpet that is loose or worn. Avoid having throw rugs at the top or bottom of the stairs. If you do have throw rugs, attach them to the floor with carpet tape. Make sure that you have a light switch at the top of the stairs and the  bottom of the stairs. If you do not have them, ask someone to add them for you. What else can I do to help prevent falls? Wear shoes that: Do not have high heels. Have rubber bottoms. Are comfortable and fit you well. Are closed at the toe. Do not wear sandals. If you use a stepladder: Make sure that it is fully opened. Do not climb a closed stepladder. Make sure that both sides of the stepladder are locked into place. Ask someone to hold it for you, if possible. Clearly mark and make sure that you can see: Any grab bars or handrails. First and last steps. Where the edge of each step is. Use tools that help you move around (mobility aids) if they are needed. These include: Canes. Walkers. Scooters. Crutches. Turn on the lights when you go into a dark area. Replace any light bulbs as soon as they burn out. Set up your furniture so you have a clear path. Avoid moving your furniture around. If any of your floors are uneven, fix them. If there are any pets around you, be aware of where they are. Review your medicines with your doctor. Some medicines can make you feel dizzy. This can increase your chance of falling. Ask your doctor what other things that you can do to help prevent falls. This information is not intended to replace advice given to you by your health care provider. Make sure you discuss any questions you have with your health care provider. Document Released: 05/20/2009 Document Revised: 12/30/2015 Document Reviewed: 08/28/2014 Elsevier Interactive Patient Education  2017 [employer].

## 2022-10-06 ENCOUNTER — Other Ambulatory Visit: Payer: Self-pay | Admitting: Family Medicine

## 2022-10-06 ENCOUNTER — Other Ambulatory Visit: Payer: Self-pay | Admitting: Nurse Practitioner

## 2022-10-06 DIAGNOSIS — E782 Mixed hyperlipidemia: Secondary | ICD-10-CM

## 2022-10-09 NOTE — Telephone Encounter (Signed)
Requested Prescriptions  Pending Prescriptions Disp Refills   ezetimibe-simvastatin (VYTORIN) 10-40 MG tablet [Pharmacy Med Name: EZETIMIBE-SIMV 10-40 MG] 90 tablet 2    Sig: Take 1 tablet by mouth at bedtime.     Cardiovascular:  Antilipid - Sterol Transport Inhibitors Failed - 10/06/2022  1:20 PM      Failed - Lipid Panel in normal range within the last 12 months    Cholesterol, Total  Date Value Ref Range Status  06/03/2015 150 100 - 199 mg/dL Final   Cholesterol  Date Value Ref Range Status  08/21/2022 154 <200 mg/dL Final   LDL Cholesterol (Calc)  Date Value Ref Range Status  08/21/2022 79 mg/dL (calc) Final    Comment:    Reference range: <100 . Desirable range <100 mg/dL for primary prevention;   <70 mg/dL for patients with CHD or diabetic patients  with > or = 2 CHD risk factors. Marland Kitchen LDL-C is now calculated using the Martin-Hopkins  calculation, which is a validated novel method providing  better accuracy than the Friedewald equation in the  estimation of LDL-C.  Cresenciano Genre et al. Annamaria Helling. [id number]): 2061-2068  (http://education.[employer].com/faq/FAQ164)    HDL  Date Value Ref Range Status  08/21/2022 49 (L) > OR = 50 mg/dL Final  06/03/2015 56 >39 mg/dL Final    Comment:    According to ATP-III Guidelines, HDL-C >59 mg/dL is considered a negative risk factor for CHD.    Triglycerides  Date Value Ref Range Status  08/21/2022 163 (H) <150 mg/dL Final         Passed - AST in normal range and within 360 days    AST  Date Value Ref Range Status  08/21/2022 21 10 - 35 U/L Final         Passed - ALT in normal range and within 360 days    ALT  Date Value Ref Range Status  08/21/2022 11 6 - 29 U/L Final         Passed - Patient is not pregnant      Passed - Valid encounter within last 12 months    Recent Outpatient Visits           2 weeks ago Primary hypertension   [facility] Delsa Grana, PA-C   1 month ago Type 2  diabetes mellitus without complication, without long-term current use of insulin ([place])   Lee Vining, PA-C   4 months ago MS (multiple sclerosis) [facility])   [facility] Delsa Grana, PA-C   7 months ago Syncope, unspecified syncope type   [facility] Delsa Grana, PA-C   8 months ago Syncope, unspecified syncope type   [facility] Myles Gip, DO       Future Appointments             In 4 months Delsa Grana, [facility], [facility]

## 2022-10-10 ENCOUNTER — Other Ambulatory Visit: Payer: Self-pay | Admitting: Family Medicine

## 2022-10-10 DIAGNOSIS — Z1231 Encounter for screening mammogram for malignant neoplasm of breast: Secondary | ICD-10-CM

## 2022-10-10 DIAGNOSIS — M5431 Sciatica, right side: Secondary | ICD-10-CM | POA: Diagnosis not present

## 2022-10-10 DIAGNOSIS — R262 Difficulty in walking, not elsewhere classified: Secondary | ICD-10-CM | POA: Diagnosis not present

## 2022-10-10 DIAGNOSIS — M530 Cervicocranial syndrome: Secondary | ICD-10-CM | POA: Diagnosis not present

## 2022-10-10 DIAGNOSIS — M542 Cervicalgia: Secondary | ICD-10-CM | POA: Diagnosis not present

## 2022-10-10 DIAGNOSIS — M5432 Sciatica, left side: Secondary | ICD-10-CM | POA: Diagnosis not present

## 2022-10-10 DIAGNOSIS — M5451 Vertebrogenic low back pain: Secondary | ICD-10-CM | POA: Diagnosis not present

## 2022-10-15 IMAGING — CR DG CERVICAL SPINE COMPLETE 4+V
1 series · 6 of 6 positions shown · non-contrast
Comparison: None Available.

CLINICAL DATA: Neck pain after fall.

EXAM:
CERVICAL SPINE - COMPLETE 4+ VIEW

[Series 1: dg cervical spine complete · 0.14mm/px · 6 of 6 slices shown]
[im 1/6]
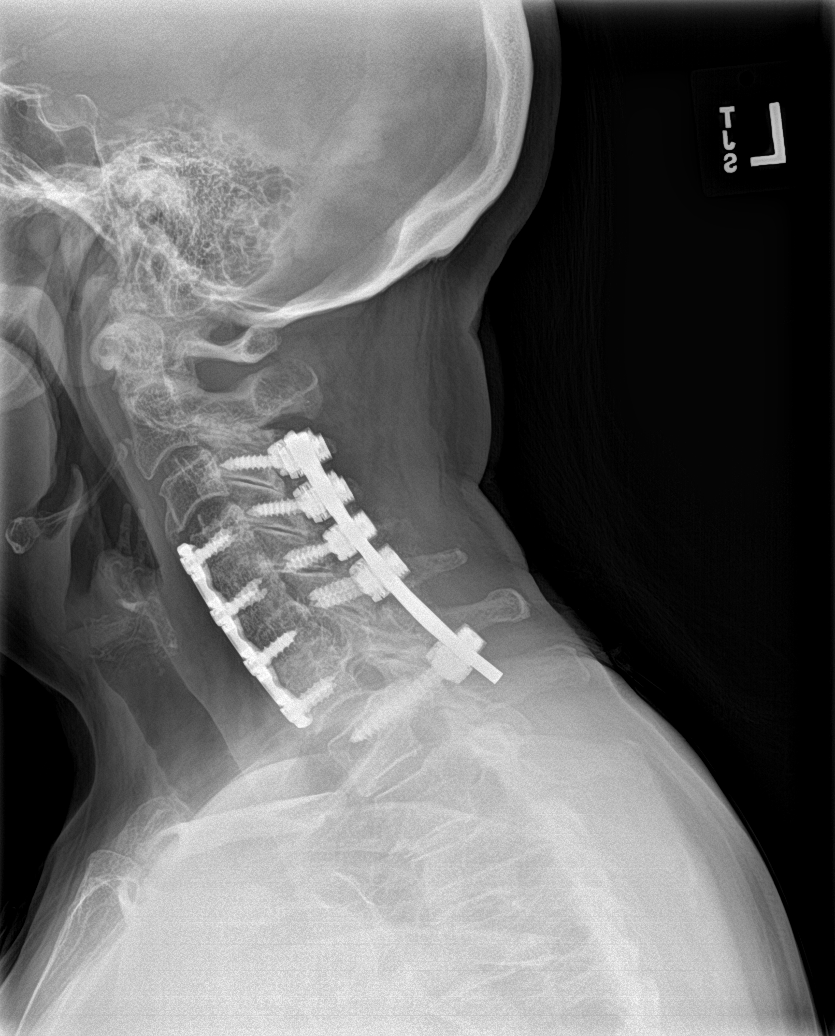
[im 2/6]
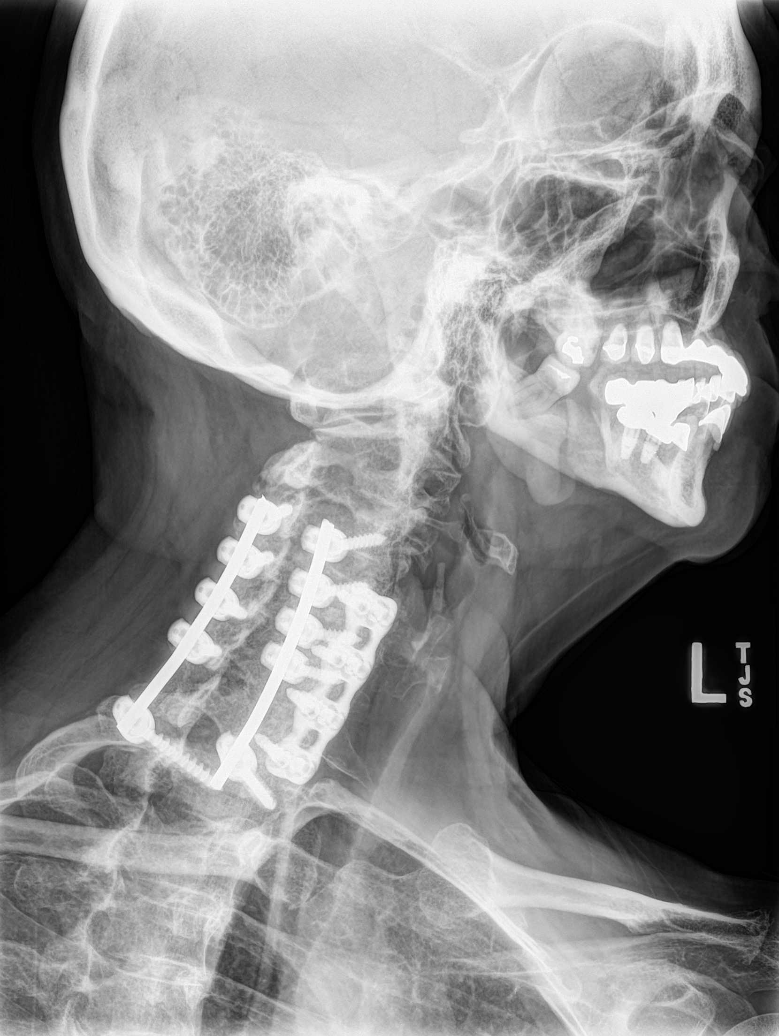
[im 3/6]
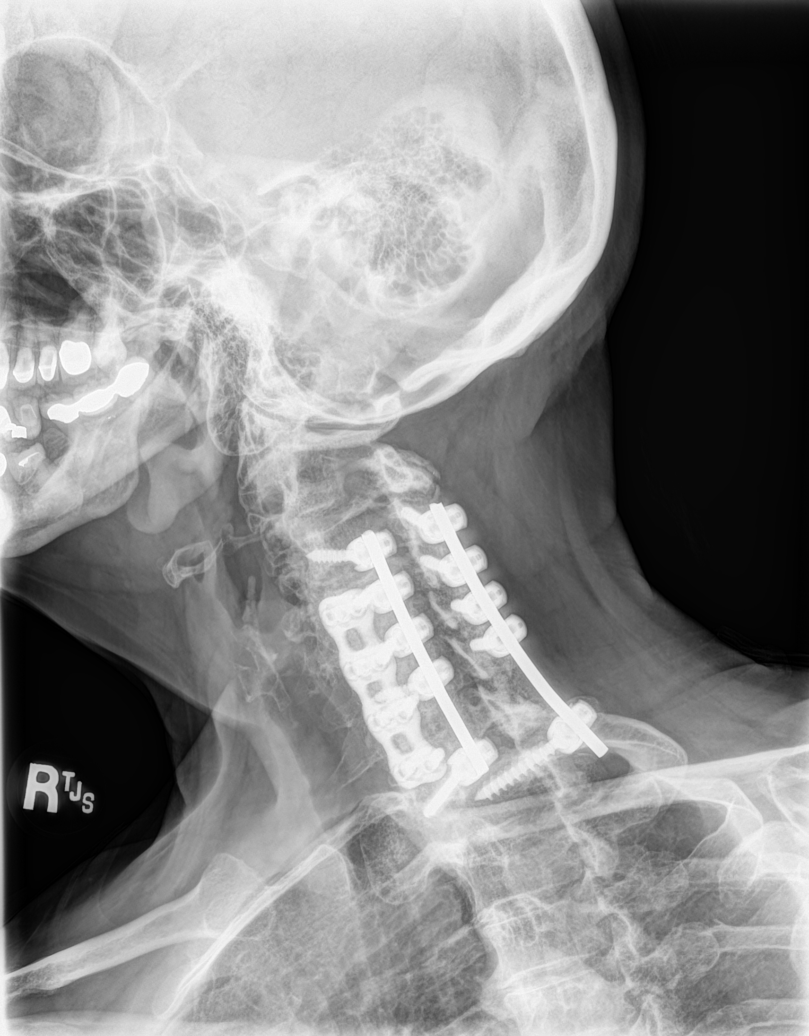
[im 4/6]
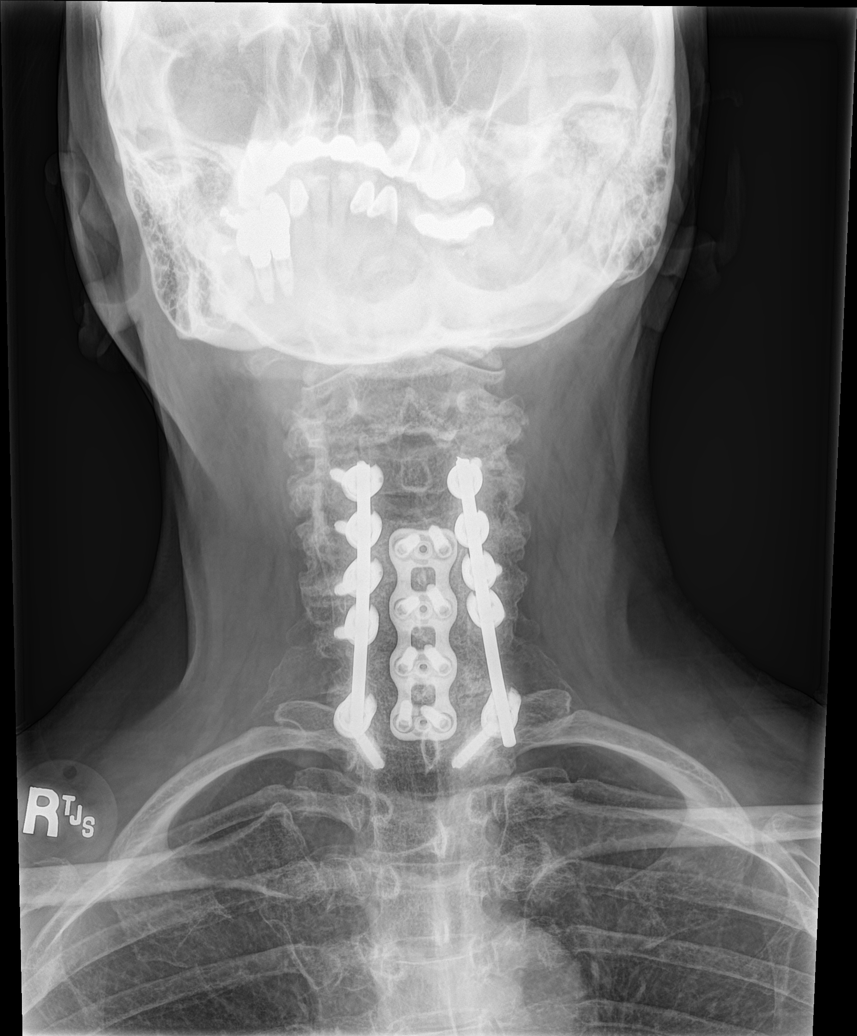
[im 5/6]
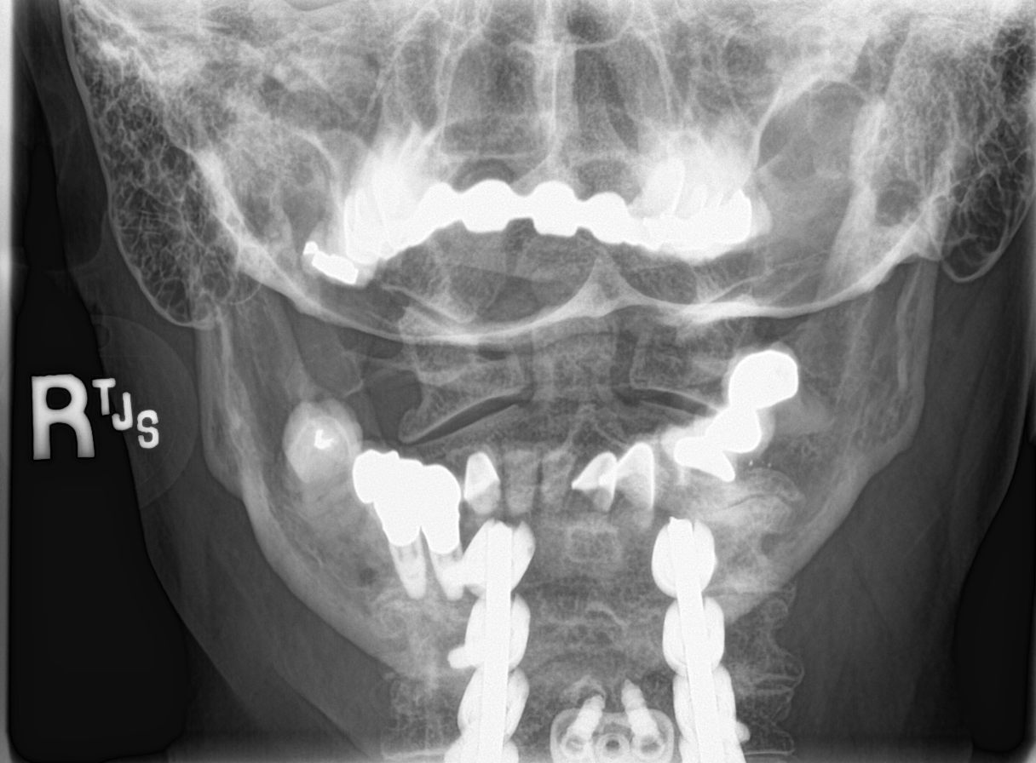
[im 6/6]
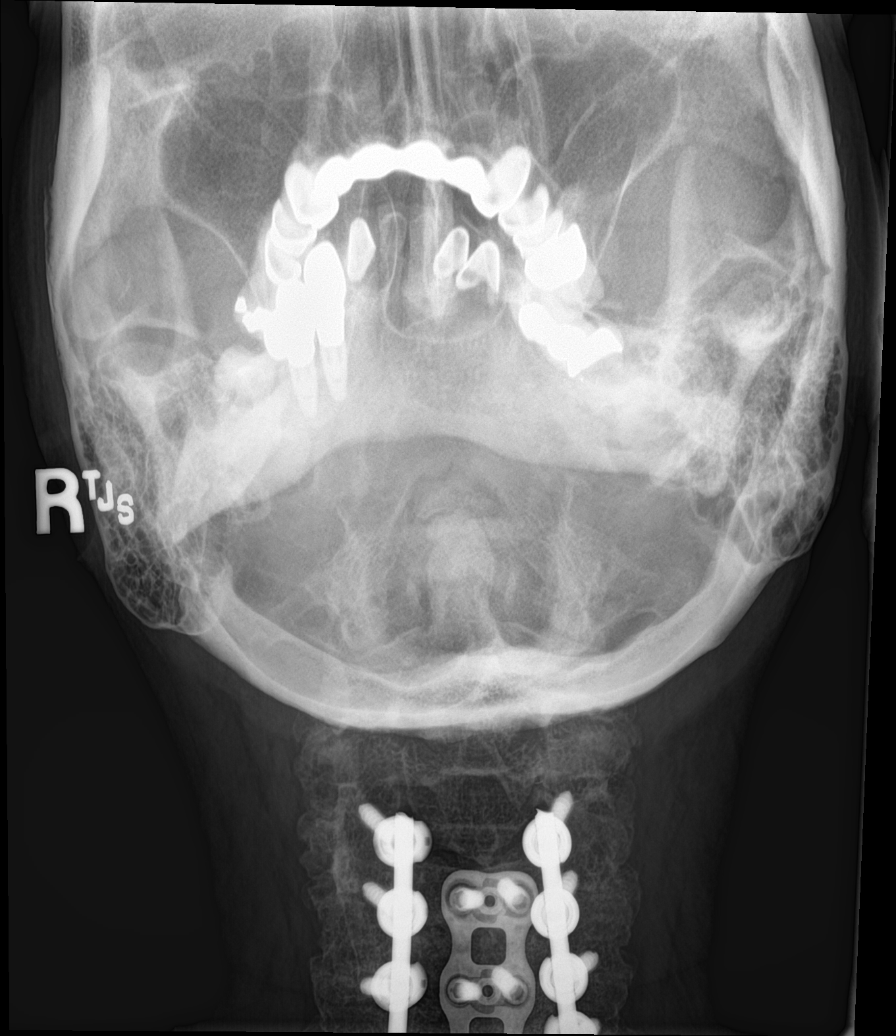

[6 of 6 positions shown; findings below may reference images not displayed]

FINDINGS: Anterior fusion from C4 through C7 with intact hardware. There is
posterior fusion from C3 through T1 threatened intrapedicular screws
with no screws at the C7 level. The hardware is intact. There is no
periprosthetic lucency. Postsurgical straightening of normal
lordosis. No evidence of acute fracture. Anterior spurring at C3-C4.
There is moderate multilevel facet hypertrophy.
IMPRESSION: 1. No radiographic evidence of acute fracture or subluxation of the
cervical spine. If there is persistent clinical concern for
fracture, recommend CT.
2. Anterior and posterior cervical fusion with intact hardware.

## 2022-10-18 DIAGNOSIS — M5431 Sciatica, right side: Secondary | ICD-10-CM | POA: Diagnosis not present

## 2022-10-18 DIAGNOSIS — M530 Cervicocranial syndrome: Secondary | ICD-10-CM | POA: Diagnosis not present

## 2022-10-18 DIAGNOSIS — R262 Difficulty in walking, not elsewhere classified: Secondary | ICD-10-CM | POA: Diagnosis not present

## 2022-10-18 DIAGNOSIS — M5451 Vertebrogenic low back pain: Secondary | ICD-10-CM | POA: Diagnosis not present

## 2022-10-18 DIAGNOSIS — M542 Cervicalgia: Secondary | ICD-10-CM | POA: Diagnosis not present

## 2022-10-18 DIAGNOSIS — M5432 Sciatica, left side: Secondary | ICD-10-CM | POA: Diagnosis not present

## 2022-10-20 DIAGNOSIS — M542 Cervicalgia: Secondary | ICD-10-CM | POA: Diagnosis not present

## 2022-10-20 DIAGNOSIS — M5432 Sciatica, left side: Secondary | ICD-10-CM | POA: Diagnosis not present

## 2022-10-20 DIAGNOSIS — R262 Difficulty in walking, not elsewhere classified: Secondary | ICD-10-CM | POA: Diagnosis not present

## 2022-10-20 DIAGNOSIS — M5431 Sciatica, right side: Secondary | ICD-10-CM | POA: Diagnosis not present

## 2022-10-20 DIAGNOSIS — M530 Cervicocranial syndrome: Secondary | ICD-10-CM | POA: Diagnosis not present

## 2022-10-20 DIAGNOSIS — M5451 Vertebrogenic low back pain: Secondary | ICD-10-CM | POA: Diagnosis not present

## 2022-10-23 DIAGNOSIS — M5432 Sciatica, left side: Secondary | ICD-10-CM | POA: Diagnosis not present

## 2022-10-23 DIAGNOSIS — R262 Difficulty in walking, not elsewhere classified: Secondary | ICD-10-CM | POA: Diagnosis not present

## 2022-10-23 DIAGNOSIS — M542 Cervicalgia: Secondary | ICD-10-CM | POA: Diagnosis not present

## 2022-10-23 DIAGNOSIS — M5431 Sciatica, right side: Secondary | ICD-10-CM | POA: Diagnosis not present

## 2022-10-23 DIAGNOSIS — M530 Cervicocranial syndrome: Secondary | ICD-10-CM | POA: Diagnosis not present

## 2022-10-23 DIAGNOSIS — M5451 Vertebrogenic low back pain: Secondary | ICD-10-CM | POA: Diagnosis not present

## 2022-10-24 DIAGNOSIS — M47812 Spondylosis without myelopathy or radiculopathy, cervical region: Secondary | ICD-10-CM | POA: Diagnosis not present

## 2022-10-30 ENCOUNTER — Other Ambulatory Visit: Payer: Self-pay | Admitting: Physician Assistant

## 2022-10-30 DIAGNOSIS — R262 Difficulty in walking, not elsewhere classified: Secondary | ICD-10-CM | POA: Diagnosis not present

## 2022-10-30 DIAGNOSIS — M530 Cervicocranial syndrome: Secondary | ICD-10-CM | POA: Diagnosis not present

## 2022-10-30 DIAGNOSIS — M5451 Vertebrogenic low back pain: Secondary | ICD-10-CM | POA: Diagnosis not present

## 2022-10-30 DIAGNOSIS — M797 Fibromyalgia: Secondary | ICD-10-CM

## 2022-10-30 DIAGNOSIS — M5432 Sciatica, left side: Secondary | ICD-10-CM | POA: Diagnosis not present

## 2022-10-30 DIAGNOSIS — M542 Cervicalgia: Secondary | ICD-10-CM | POA: Diagnosis not present

## 2022-10-30 DIAGNOSIS — M5431 Sciatica, right side: Secondary | ICD-10-CM | POA: Diagnosis not present

## 2022-10-30 DIAGNOSIS — G629 Polyneuropathy, unspecified: Secondary | ICD-10-CM

## 2022-10-30 DIAGNOSIS — G8929 Other chronic pain: Secondary | ICD-10-CM

## 2022-10-30 NOTE — Telephone Encounter (Signed)
Requested medication (s) are due for refill today: yes  Requested medication (s) are on the active medication list: yes  Last refill:  08/21/22 #180/0  Future visit scheduled: yes  Notes to clinic:  Unable to refill per protocol, cannot delegate.    Requested Prescriptions  Pending Prescriptions Disp Refills   pregabalin (LYRICA) 100 MG capsule [Pharmacy Med Name: PREGABALIN 100 MG CAPSULE] 180 capsule 0    Sig: Take 1 capsule (100 mg total) by mouth in the morning AND 2 capsules (200 mg total) at bedtime.     Not Delegated - Neurology:  Anticonvulsants - Controlled - pregabalin Failed - 10/30/2022 11:56 AM      Failed - This refill cannot be delegated      Passed - Cr in normal range and within 360 days    Creat  Date Value Ref Range Status  08/21/2022 0.92 0.60 - 1.00 mg/dL Final   Creatinine, Urine  Date Value Ref Range Status  10/03/2021 203 20 - 275 mg/dL Final         Passed - Completed PHQ-2 or PHQ-9 in the last 360 days      Passed - Valid encounter within last 12 months    Recent Outpatient Visits           1 month ago Primary hypertension   [facility] Delsa Grana, PA-C   2 months ago Type 2 diabetes mellitus without complication, without long-term current use of insulin ([place])   [facility] Mecum, Dani Gobble, PA-C   5 months ago MS (multiple sclerosis) [facility])   [facility] Delsa Grana, PA-C   8 months ago Syncope, unspecified syncope type   [facility] Delsa Grana, PA-C   9 months ago Syncope, unspecified syncope type   [facility] Myles Gip, DO       Future Appointments             In 3 months Delsa Grana, [facility], [facility]

## 2022-10-31 DIAGNOSIS — M47812 Spondylosis without myelopathy or radiculopathy, cervical region: Secondary | ICD-10-CM | POA: Diagnosis not present

## 2022-11-10 DIAGNOSIS — E119 Type 2 diabetes mellitus without complications: Secondary | ICD-10-CM | POA: Diagnosis not present

## 2022-11-10 DIAGNOSIS — I10 Essential (primary) hypertension: Secondary | ICD-10-CM | POA: Diagnosis not present

## 2022-11-10 DIAGNOSIS — E782 Mixed hyperlipidemia: Secondary | ICD-10-CM | POA: Diagnosis not present

## 2022-11-22 ENCOUNTER — Other Ambulatory Visit: Payer: Self-pay | Admitting: Family Medicine

## 2022-11-22 DIAGNOSIS — E039 Hypothyroidism, unspecified: Secondary | ICD-10-CM

## 2022-11-27 ENCOUNTER — Ambulatory Visit
Admission: RE | Admit: 2022-11-27 | Discharge: 2022-11-27 | Disposition: A | Discharge status: Home / Self Care | Payer: Medicare PPO | Source: Ambulatory Visit | Attending: Family Medicine | Admitting: Family Medicine

## 2022-11-27 DIAGNOSIS — Z13820 Encounter for screening for osteoporosis: Secondary | ICD-10-CM

## 2022-11-27 DIAGNOSIS — M8589 Other specified disorders of bone density and structure, multiple sites: Secondary | ICD-10-CM | POA: Insufficient documentation

## 2022-11-27 DIAGNOSIS — Z780 Asymptomatic menopausal state: Secondary | ICD-10-CM | POA: Diagnosis not present

## 2022-11-27 DIAGNOSIS — M85852 Other specified disorders of bone density and structure, left thigh: Secondary | ICD-10-CM | POA: Diagnosis not present

## 2022-11-27 DIAGNOSIS — Z1231 Encounter for screening mammogram for malignant neoplasm of breast: Secondary | ICD-10-CM | POA: Insufficient documentation

## 2022-11-30 ENCOUNTER — Telehealth: Payer: Self-pay | Admitting: Family Medicine

## 2022-11-30 DIAGNOSIS — M859 Disorder of bone density and structure, unspecified: Secondary | ICD-10-CM | POA: Diagnosis not present

## 2022-11-30 DIAGNOSIS — M47817 Spondylosis without myelopathy or radiculopathy, lumbosacral region: Secondary | ICD-10-CM | POA: Diagnosis not present

## 2022-11-30 DIAGNOSIS — Z981 Arthrodesis status: Secondary | ICD-10-CM | POA: Diagnosis not present

## 2022-11-30 NOTE — Telephone Encounter (Signed)
Pt needs an appointment

## 2022-11-30 NOTE — Telephone Encounter (Signed)
PT has called in wanting to ask Tapia's nurse if she will put her on a drug "Forteo" for her bones. She has been seeing Dr Gearlean Alf at [facility] and states that he says he can not put pt on this but her PCP may can do so. Pt was told she will probably need an appt but she saw dr in February and she wants to just ask if she can be prescribed the med instead. Her contact # is [phone].

## 2022-12-04 ENCOUNTER — Other Ambulatory Visit: Payer: Self-pay | Admitting: Nurse Practitioner

## 2022-12-04 DIAGNOSIS — R262 Difficulty in walking, not elsewhere classified: Secondary | ICD-10-CM | POA: Diagnosis not present

## 2022-12-04 DIAGNOSIS — M5431 Sciatica, right side: Secondary | ICD-10-CM | POA: Diagnosis not present

## 2022-12-04 DIAGNOSIS — M5432 Sciatica, left side: Secondary | ICD-10-CM | POA: Diagnosis not present

## 2022-12-04 DIAGNOSIS — M530 Cervicocranial syndrome: Secondary | ICD-10-CM | POA: Diagnosis not present

## 2022-12-04 DIAGNOSIS — I10 Essential (primary) hypertension: Secondary | ICD-10-CM

## 2022-12-04 DIAGNOSIS — M5451 Vertebrogenic low back pain: Secondary | ICD-10-CM | POA: Diagnosis not present

## 2022-12-04 DIAGNOSIS — M542 Cervicalgia: Secondary | ICD-10-CM | POA: Diagnosis not present

## 2022-12-04 NOTE — Progress Notes (Signed)
Lvm asking pt to return call to schedule appt per Danelle Berry one day next week.

## 2022-12-05 ENCOUNTER — Encounter: Payer: Self-pay | Admitting: Family Medicine

## 2022-12-05 ENCOUNTER — Ambulatory Visit
Admission: RE | Admit: 2022-12-05 | Discharge: 2022-12-05 | Disposition: A | Discharge status: Home / Self Care | Payer: Medicare PPO | Attending: Family Medicine | Admitting: Family Medicine

## 2022-12-05 ENCOUNTER — Ambulatory Visit: Payer: Medicare PPO | Admitting: Family Medicine

## 2022-12-05 ENCOUNTER — Ambulatory Visit
Admission: RE | Admit: 2022-12-05 | Discharge: 2022-12-05 | Disposition: A | Discharge status: Home / Self Care | Payer: Medicare PPO | Source: Ambulatory Visit | Attending: Family Medicine | Admitting: Family Medicine

## 2022-12-05 DIAGNOSIS — F3342 Major depressive disorder, recurrent, in full remission: Secondary | ICD-10-CM

## 2022-12-05 DIAGNOSIS — G35 Multiple sclerosis: Secondary | ICD-10-CM

## 2022-12-05 DIAGNOSIS — M25551 Pain in right hip: Secondary | ICD-10-CM

## 2022-12-05 DIAGNOSIS — R1310 Dysphagia, unspecified: Secondary | ICD-10-CM | POA: Diagnosis not present

## 2022-12-05 DIAGNOSIS — M8589 Other specified disorders of bone density and structure, multiple sites: Secondary | ICD-10-CM

## 2022-12-05 DIAGNOSIS — M26629 Arthralgia of temporomandibular joint, unspecified side: Secondary | ICD-10-CM | POA: Diagnosis not present

## 2022-12-05 DIAGNOSIS — G35D Multiple sclerosis, unspecified: Secondary | ICD-10-CM

## 2022-12-05 NOTE — Telephone Encounter (Signed)
Requested medication (s) are due for refill today: expired medication  Requested medication (s) are on the active medication list: yes   Last refill:  04/01/20 #90 3 refills  Future visit scheduled: yes in 2 months   Notes to clinic:   expired medication . Do you want to renew Rx?     Requested Prescriptions  Pending Prescriptions Disp Refills   metoprolol succinate (TOPROL-XL) 50 MG 24 hr tablet [Pharmacy Med Name: METOPROLOL SUCC ER 50 MG TAB] 90 tablet 0    Sig: Take 1 tablet (50 mg total) by mouth once daily     Cardiovascular:  Beta Blockers Passed - 12/04/2022 12:41 PM      Passed - Last BP in normal range    BP Readings from Last 1 Encounters:  09/19/22 120/70         Passed - Last Heart Rate in normal range    Pulse Readings from Last 1 Encounters:  09/19/22 87         Passed - Valid encounter within last 6 months    Recent Outpatient Visits           2 months ago Primary hypertension   [place] [facility] Danelle Berry, PA-C   3 months ago Type 2 diabetes mellitus without complication, without long-term current use of insulin (H[facility])   [place] [facility] Mecum, Oswaldo Conroy, PA-C   6 months ago MS (multiple sclerosis) [facility])   [facility] Danelle Berry, PA-C   9 months ago Syncope, unspecified syncope type   [facility] Danelle Berry, PA-C   10 months ago Syncope, unspecified syncope type   [facility] Caro Laroche, DO       Future Appointments             In 2 months Danelle Berry, PA-C [facility], [facility]

## 2022-12-05 NOTE — Progress Notes (Unsigned)
Patient ID: Brittany Hill, female    DOB: Apr 24, 1951, 72 y.o.   MRN: [id number]  PCP: Brittany Berry, PA-C  Chief Complaint  Patient presents with   Consult    Pt states seen a provider     Subjective:   Brittany Hill is a 72 y.o. female, presents to clinic with CC of the following:  HPI  Here to discuss recommendation from spine specialists of starting forteo for upcoming surgery   Last calcium was 10.6, previously normal Last vitamin D Lab Results  Component Value Date   VD25OH 32 10/11/2018     Narrative & Impression  EXAM: DUAL X-RAY ABSORPTIOMETRY (DXA) FOR BONE MINERAL DENSITY   IMPRESSION: Dear Dr. Angelica Chessman,   Your patient Brittany Hill completed a FRAX assessment on 11/27/2022 using the Lunar iDXA DXA System (analysis version: 14.10) manufactured by [employer]. The following summarizes the results of our evaluation.   PATIENT BIOGRAPHICAL: Name: Brittany Hill Patient ID: [id number] Birth Date: 1951-01-31 Height:    64.0 in. Gender:     Female    [age]       Weight:    187.0 lbs. Ethnicity:  White                            Exam Date: 11/27/2022   FRAX* RESULTS:  (version: 3.5) 10-year Probability of Fracture1 Major Osteoporotic Fracture2 Hip Fracture 9.8% 1.5% Population: [place] (Caucasian) Risk Factors: None   Based on Femur (Left) Neck BMD   1 -The 10-year probability of fracture may be lower than reported if the patient has received treatment. 2 -Major Osteoporotic Fracture: Clinical Spine, Forearm, Hip or Shoulder   *FRAX is a Armed forces logistics/support/administrative officer of the [employer] of [employer] for Metabolic Bone Disease, a World Science writer (WHO) [employer].   ASSESSMENT: The probability of a major osteoporotic fracture is 9.8% within the next ten years.   The probability of a hip fracture is 1.5% within the next ten years.   Your patient Brittany Hill completed a BMD test on 11/27/2022     On Vit D 3 - gummies 1000 IU - takes a couple a day  Calcium supplement every other day  Hx of osteoporosis and prolia use, she was not cleared to take oral bisphosphonates due to dysphagia and jaw pain Per care everywhere records from her surgeon with osteopenia in the spinal bodies that is known to cause possible surgical complications so he would like her bone strength and with the medications for 6 months prior to surgery     Patient Active Problem List   Diagnosis Date Noted   History of total knee replacement, bilateral 05/19/2022   At moderate risk for fall 05/19/2022   Syncope 01/17/2022   Dyslipidemia associated with type 2 diabetes mellitus (HCC) 07/12/2021   Acquired stenosis of bilateral nasolacrimal duct 02/08/2021   Type 2 diabetes mellitus without complication, without long-term current use of insulin (HCC) 03/22/2020   Acquired trigger finger 01/06/2020   Bouchard's nodes (with arthropathy) 01/06/2020   Chronic post-operative pain 09/05/2019   Neck pain 09/05/2019   Cervical radiculopathy 09/05/2019   Displacement of cervical intervertebral disc 05/14/2019   Fatty liver disease, nonalcoholic 03/28/2019   Osteopenia 10/20/2018   Obesity (BMI 30.0-34.9) 09/27/2018   Cataract, left 04/02/2018   Osteoarthritis of left ankle 02/16/2018   Asthma without status asthmaticus 10/05/2017  Genetic testing 08/28/2017   Family hx-breast malignancy 08/02/2017   Dense breast tissue on mammogram 07/28/2016   Left sided sciatica 12/19/2015   Left lumbar radiculopathy 12/16/2015   Hypomagnesemia 12/06/2015   Hypokalemia 12/02/2015   Fibromyalgia    MS (multiple sclerosis) (HCC)    Chronic constipation    OA (osteoarthritis)    Hypothyroidism    IFG (impaired fasting glucose)    Hypertension    Hyperlipidemia    GERD (gastroesophageal reflux disease)    MDD (major depressive disorder), recurrent, in full remission (HCC)    Anxiety    TMJ syndrome       Current  Outpatient Medications:    acetaminophen (TYLENOL) 500 MG tablet, Take 1,000 mg by mouth every 6 (six) hours as needed., Disp: , Rfl:    albuterol (VENTOLIN HFA) 108 (90 Base) MCG/ACT inhaler, Inhale 2 puffs into the lungs every 6 (six) hours as needed for wheezing or shortness of breath., Disp: 18 g, Rfl: 0   cholecalciferol (VITAMIN D3) 25 MCG (1000 UNIT) tablet, Take 1,000 Units by mouth daily., Disp: , Rfl:    DULoxetine (CYMBALTA) 20 MG capsule, Take 2 CAPSULES IN THE MORNING AND ONE IN THE EVENING., Disp: 270 capsule, Rfl: 2   EPINEPHrine 0.3 mg/0.3 mL IJ SOAJ injection, Inject 0.3 mg into the muscle once. , Disp: , Rfl:    ezetimibe-simvastatin (VYTORIN) 10-40 MG tablet, Take 1 tablet by mouth at bedtime., Disp: 90 tablet, Rfl: 2   famotidine (PEPCID) 20 MG tablet, Take 1 tablet (20 mg total) by mouth 2 (two) times daily as needed for heartburn or indigestion., Disp: 60 tablet, Rfl: 5   hydrALAZINE (APRESOLINE) 10 MG tablet, Take 1 tablet (10 mg total) by mouth 2 (two) times daily as needed (take for sustained BP >160/100)., Disp: 30 tablet, Rfl: 1   ipratropium (ATROVENT) 0.03 % nasal spray, Place 2 sprays into both nostrils every 12 (twelve) hours., Disp: 30 mL, Rfl: 12   levothyroxine (SYNTHROID) 75 MCG tablet, Take 1 tablet (75 mcg total) by mouth daily before breakfast., Disp: 90 tablet, Rfl: 2   meloxicam (MOBIC) 15 MG tablet, Take 1 tablet (15 mg total) by mouth daily., Disp: 90 tablet, Rfl: 0   metFORMIN (GLUCOPHAGE-XR) 500 MG 24 hr tablet, Take 1 tablet (500 mg total) by mouth 2 (two) times daily., Disp: 180 tablet, Rfl: 3   metoprolol succinate (TOPROL-XL) 50 MG 24 hr tablet, Take 1 tablet (50 mg total) by mouth at bedtime. Take with or immediately following a meal., Disp: 90 tablet, Rfl: 3   montelukast (SINGULAIR) 10 MG tablet, Take 1 tablet (10 mg total) by mouth at bedtime., Disp: 90 tablet, Rfl: 3   naloxone (NARCAN) nasal spray 4 mg/0.1 mL, Place into the nose., Disp: , Rfl:     ondansetron (ZOFRAN ODT) 4 MG disintegrating tablet, Take 1 tablet (4 mg total) by mouth every 8 (eight) hours as needed for nausea or vomiting., Disp: 20 tablet, Rfl: 1   oxycodone (OXY-IR) 5 MG capsule, Take 5 mg by mouth every 6 (six) hours as needed., Disp: , Rfl:    pantoprazole (PROTONIX) 40 MG tablet, Take 1 tablet (40 mg total) by mouth at bedtime., Disp: 90 tablet, Rfl: 0   pregabalin (LYRICA) 100 MG capsule, Take 1 capsule (100 mg total) by mouth in the morning AND 2 capsules (200 mg total) at bedtime., Disp: 180 capsule, Rfl: 0   spironolactone (ALDACTONE) 25 MG tablet, Take 1 tablet (25 mg total) by  mouth daily. With breakfast, Disp: 90 tablet, Rfl: 0   tiZANidine (ZANAFLEX) 4 MG tablet, Take 2 tablets (8 mg total) by mouth 3 (three) times daily., Disp: 540 tablet, Rfl: 1   calcium carbonate (OS-CAL) 600 MG TABS tablet, Take 600 mg by mouth as needed.  (Patient not taking: Reported on 09/19/2022), Disp: , Rfl:    fluticasone-salmeterol (ADVAIR) 100-50 MCG/ACT AEPB, Inhale 1 puff into the lungs 2 (two) times daily. (Patient not taking: Reported on 09/19/2022), Disp: 1 each, Rfl: 1   Allergies  Allergen Reactions   Shellfish Allergy Rash and Swelling   Codeine Nausea And Vomiting   Sulfa Antibiotics Swelling     Social History   Tobacco Use   Smoking status: Never   Smokeless tobacco: Never  Vaping Use   Vaping Use: Never used  Substance Use Topics   Alcohol use: No   Drug use: No      Chart Review Today: I personally reviewed active problem list, medication list, allergies, family history, social history, health maintenance, notes from last encounter, lab results, imaging with the patient/caregiver today.   Review of Systems  Constitutional: Negative.   HENT: Negative.    Eyes: Negative.   Respiratory: Negative.    Cardiovascular: Negative.   Gastrointestinal: Negative.   Endocrine: Negative.   Genitourinary: Negative.   Musculoskeletal: Negative.   Skin:  Negative.   Allergic/Immunologic: Negative.   Neurological: Negative.   Hematological: Negative.   Psychiatric/Behavioral: Negative.    All other systems reviewed and are negative.      Objective:   Vitals:   12/05/22 1503  BP: 106/62  Pulse: 86  Resp: 16  Temp: (!) 97.5 F (36.4 C)  TempSrc: Oral  SpO2: 98%  Weight: 192 lb 11.2 oz (87.4 kg)  Height: 5\' 4"  (1.626 m)    Body mass index is 33.08 kg/m.  Physical Exam Vitals and nursing note reviewed.  Constitutional:      General: She is not in acute distress.    Appearance: Normal appearance. She is well-developed. She is not ill-appearing, toxic-appearing or diaphoretic.  HENT:     Head: Normocephalic and atraumatic.     Nose: Nose normal.  Eyes:     General:        Right eye: No discharge.        Left eye: No discharge.     Conjunctiva/sclera: Conjunctivae normal.  Neck:     Trachea: No tracheal deviation.  Cardiovascular:     Rate and Rhythm: Normal rate and regular rhythm.  Pulmonary:     Effort: Pulmonary effort is normal. No respiratory distress.     Breath sounds: No stridor.  Musculoskeletal:     Cervical back: Rigidity present. Decreased range of motion.  Skin:    General: Skin is warm and dry.     Findings: No rash.  Neurological:     Mental Status: She is alert.     Motor: No abnormal muscle tone.     Coordination: Coordination normal.     Gait: Gait abnormal.  Psychiatric:        Behavior: Behavior normal.      Results for orders placed or performed in visit on 08/21/22  Hemoglobin A1c  Result Value Ref Range   Hgb A1c MFr Bld 6.2 (H) <5.7 % of total Hgb   Mean Plasma Glucose 131 mg/dL   eAG (mmol/L) 7.3 mmol/L  TSH  Result Value Ref Range   TSH 2.60 0.40 - 4.50 mIU/L  Lipid Profile  Result Value Ref Range   Cholesterol 154 <200 mg/dL   HDL 49 (L) > OR = 50 mg/dL   Triglycerides 981 (H) <150 mg/dL   LDL Cholesterol (Calc) 79 mg/dL (calc)   Total CHOL/HDL Ratio 3.1 <5.0 (calc)    Non-HDL Cholesterol (Calc) 105 <130 mg/dL (calc)  COMPLETE METABOLIC PANEL WITH GFR  Result Value Ref Range   Glucose, Bld 107 (H) 65 - 99 mg/dL   BUN 12 7 - 25 mg/dL   Creat 1.91 4.78 - 2.95 mg/dL   eGFR 67 > OR = 60 AO/ZHY/8.65H8   BUN/Creatinine Ratio SEE NOTE: 6 - 22 (calc)   Sodium 139 135 - 146 mmol/L   Potassium 4.9 3.5 - 5.3 mmol/L   Chloride 104 98 - 110 mmol/L   CO2 30 20 - 32 mmol/L   Calcium 10.6 (H) 8.6 - 10.4 mg/dL   Total Protein 7.3 6.1 - 8.1 g/dL   Albumin 4.8 3.6 - 5.1 g/dL   Globulin 2.5 1.9 - 3.7 g/dL (calc)   AG Ratio 1.9 1.0 - 2.5 (calc)   Total Bilirubin 0.4 0.2 - 1.2 mg/dL   Alkaline phosphatase (APISO) 33 (L) 37 - 153 U/L   AST 21 10 - 35 U/L   ALT 11 6 - 29 U/L  CBC w/Diff/Platelet  Result Value Ref Range   WBC 6.1 3.8 - 10.8 Thousand/uL   RBC 3.96 3.80 - 5.10 Million/uL   Hemoglobin 12.0 11.7 - 15.5 g/dL   HCT 46.9 62.9 - 52.8 %   MCV 89.4 80.0 - 100.0 fL   MCH 30.3 27.0 - 33.0 pg   MCHC 33.9 32.0 - 36.0 g/dL   RDW 41.3 24.4 - 01.0 %   Platelets 188 140 - 400 Thousand/uL   MPV 10.5 7.5 - 12.5 fL   Neutro Abs 3,776 1,500 - 7,800 cells/uL   Lymphs Abs 1,488 850 - 3,900 cells/uL   Absolute Monocytes 494 200 - 950 cells/uL   Eosinophils Absolute 311 15 - 500 cells/uL   Basophils Absolute 31 0 - 200 cells/uL   Neutrophils Relative % 61.9 %   Total Lymphocyte 24.4 %   Monocytes Relative 8.1 %   Eosinophils Relative 5.1 %   Basophils Relative 0.5 %       Assessment & Plan:     ICD-10-CM   1. Hypercalcemia  E83.52 COMPLETE METABOLIC PANEL WITH GFR    VITAMIN D 25 Hydroxy (Vit-D Deficiency, Fractures)    Parathyroid hormone, intact (no Ca)   recheck labs, review supplements    2. Dysphagia, unspecified type  R13.10    previously unable to take oral bisphosphonates    3. TMJ syndrome  M26.629    previously unable to take oral bisphosphonates    4. Osteopenia of multiple sites  M85.89 COMPLETE METABOLIC PANEL WITH GFR    VITAMIN D 25  Hydroxy (Vit-D Deficiency, Fractures)    Parathyroid hormone, intact (no Ca)   History of osteoporosis that T-scores improved to osteopenia with multiple Prolia treatments for several years    5. Right hip pain  M25.551 DG Hip Unilat W OR W/O Pelvis 2-3 Views Right   acute on chronic radiated previously to outside of leg with hx of lumbar radiculopathy and a lot of spine pathology, more recent groin inner leg pain    6. MS (multiple sclerosis) (HCC) Chronic G35    Managed by neurology, contributing to complexity of care, mobility    7. MDD (major depressive disorder),  recurrent, in full remission [facility]) Chronic F33.42    Patient reports managing fairly well with her neck pain is she can try to stay positive    Phq 9 reviewed and neg today, on cymbalta for chronic pain, overall mood good    12/05/2022    2:52 PM 10/05/2022   10:05 AM 09/19/2022   10:43 AM  Depression screen PHQ 2/9  Decreased Interest 0 1 0  Down, Depressed, Hopeless 0 1 0  PHQ - 2 Score 0 2 0  Altered sleeping 0 0 0  Tired, decreased energy 0 0 0  Change in appetite 0 0 0  Feeling bad or failure about yourself  0 0 0  Trouble concentrating 0 0 0  Moving slowly or fidgety/restless 0 0 0  Suicidal thoughts 0 0 0  PHQ-9 Score 0 2 0  Difficult doing work/chores Not difficult at all Somewhat difficult Not difficult at all         Brittany Berry, PA-C 12/05/22 3:04 PM

## 2022-12-06 ENCOUNTER — Other Ambulatory Visit: Payer: Self-pay | Admitting: Family Medicine

## 2022-12-06 ENCOUNTER — Encounter: Payer: Self-pay | Admitting: Family Medicine

## 2022-12-06 DIAGNOSIS — R1310 Dysphagia, unspecified: Secondary | ICD-10-CM

## 2022-12-06 DIAGNOSIS — Z9889 Other specified postprocedural states: Secondary | ICD-10-CM

## 2022-12-06 DIAGNOSIS — M810 Age-related osteoporosis without current pathological fracture: Secondary | ICD-10-CM

## 2022-12-06 DIAGNOSIS — M26629 Arthralgia of temporomandibular joint, unspecified side: Secondary | ICD-10-CM

## 2022-12-06 LAB — COMPLETE METABOLIC PANEL WITH GFR
AG Ratio: 2.1 (calc) (ref 1.0–2.5)
ALT: 13 U/L (ref 6–29)
AST: 20 U/L (ref 10–35)
Albumin: 4.6 g/dL (ref 3.6–5.1)
Alkaline phosphatase (APISO): 33 U/L — ABNORMAL LOW (ref 37–153)
BUN/Creatinine Ratio: 12 (calc) (ref 6–22)
BUN: 13 mg/dL (ref 7–25)
CO2: 26 mmol/L (ref 20–32)
Calcium: 9.8 mg/dL (ref 8.6–10.4)
Chloride: 101 mmol/L (ref 98–110)
Creat: 1.06 mg/dL — ABNORMAL HIGH (ref 0.60–1.00)
Globulin: 2.2 g/dL (calc) (ref 1.9–3.7)
Glucose, Bld: 103 mg/dL — ABNORMAL HIGH (ref 65–99)
Potassium: 4.5 mmol/L (ref 3.5–5.3)
Sodium: 135 mmol/L (ref 135–146)
Total Bilirubin: 0.4 mg/dL (ref 0.2–1.2)
Total Protein: 6.8 g/dL (ref 6.1–8.1)
eGFR: 56 mL/min/{1.73_m2} — ABNORMAL LOW (ref >=60)

## 2022-12-06 LAB — PARATHYROID HORMONE, INTACT (NO CA): PTH: 43 pg/mL (ref 16–77)

## 2022-12-06 LAB — VITAMIN D 25 HYDROXY (VIT D DEFICIENCY, FRACTURES): Vit D, 25-Hydroxy: 44 ng/mL (ref 30–100)

## 2022-12-06 MED ORDER — INSULIN PEN NEEDLE 32G X 4 MM MISC
5 refills | Status: DC
Start: 2022-12-06 — End: 2022-12-20

## 2022-12-06 MED ORDER — TERIPARATIDE 600 MCG/2.4ML ~~LOC~~ SOPN
PEN_INJECTOR | SUBCUTANEOUS | 5 refills | Status: DC
Start: 2022-12-06 — End: 2023-01-08

## 2022-12-06 NOTE — Progress Notes (Signed)
Pt previously completed 5 years of prolia injections Oral bisphosphonates contraindicated for pt due to jaw issues and dysphagia - not cleared by GI or dentist Hx of osteoporosis Scheduled for spine surgery - multiple prior surgeries Surgeon asked for pt to get on forteo to optimize bone density prior to surgery to minimize risk  Labs show normal calcium, Vit D optimized and not PTH lab abnormality  Forteo discussed with [facility]CM pharmacist and SP Dr. Carlynn Purl and pt at appt yesterday. Chart thoroughly reviewed.  We will plan for getting Rx approved with insurance first, then arrange education in office followed by close monitoring with first few doses of injections at home.    ICD-10-CM   1. Osteoporosis without current pathological fracture, unspecified osteoporosis type  M81.0 Teriparatide, Recombinant, (FORTEO) 600 MCG/2.4ML SOPN    Insulin Pen Needle 32G X 4 MM MISC    2. Personal history of spine surgery  Z98.890 Teriparatide, Recombinant, (FORTEO) 600 MCG/2.4ML SOPN    3. Dysphagia, unspecified type  R13.10 Teriparatide, Recombinant, (FORTEO) 600 MCG/2.4ML SOPN    4. TMJ syndrome  M26.629 Teriparatide, Recombinant, (FORTEO) 600 MCG/2.4ML SOPN     Will need to coordinate with pharmacy needed needles for this pen and other supplies.

## 2022-12-07 ENCOUNTER — Encounter: Payer: Self-pay | Admitting: Family Medicine

## 2022-12-08 DIAGNOSIS — M5431 Sciatica, right side: Secondary | ICD-10-CM | POA: Diagnosis not present

## 2022-12-08 DIAGNOSIS — R262 Difficulty in walking, not elsewhere classified: Secondary | ICD-10-CM | POA: Diagnosis not present

## 2022-12-08 DIAGNOSIS — M5432 Sciatica, left side: Secondary | ICD-10-CM | POA: Diagnosis not present

## 2022-12-08 DIAGNOSIS — M5451 Vertebrogenic low back pain: Secondary | ICD-10-CM | POA: Diagnosis not present

## 2022-12-08 DIAGNOSIS — M530 Cervicocranial syndrome: Secondary | ICD-10-CM | POA: Diagnosis not present

## 2022-12-08 DIAGNOSIS — M542 Cervicalgia: Secondary | ICD-10-CM | POA: Diagnosis not present

## 2022-12-11 ENCOUNTER — Encounter: Payer: Self-pay | Admitting: Family Medicine

## 2022-12-13 DIAGNOSIS — M5431 Sciatica, right side: Secondary | ICD-10-CM | POA: Diagnosis not present

## 2022-12-13 DIAGNOSIS — M5451 Vertebrogenic low back pain: Secondary | ICD-10-CM | POA: Diagnosis not present

## 2022-12-13 DIAGNOSIS — M530 Cervicocranial syndrome: Secondary | ICD-10-CM | POA: Diagnosis not present

## 2022-12-13 DIAGNOSIS — R262 Difficulty in walking, not elsewhere classified: Secondary | ICD-10-CM | POA: Diagnosis not present

## 2022-12-13 DIAGNOSIS — M542 Cervicalgia: Secondary | ICD-10-CM | POA: Diagnosis not present

## 2022-12-13 DIAGNOSIS — M5432 Sciatica, left side: Secondary | ICD-10-CM | POA: Diagnosis not present

## 2022-12-14 ENCOUNTER — Other Ambulatory Visit: Payer: Self-pay | Admitting: Physician Assistant

## 2022-12-14 DIAGNOSIS — M542 Cervicalgia: Secondary | ICD-10-CM

## 2022-12-14 NOTE — Telephone Encounter (Signed)
Requested Prescriptions  Pending Prescriptions Disp Refills   meloxicam (MOBIC) 15 MG tablet [Pharmacy Med Name: MELOXICAM 15 MG TABLET] 90 tablet 0    Sig: Take 1 tablet (15 mg total) by mouth daily.     Analgesics:  COX2 Inhibitors Failed - 12/14/2022  3:36 PM      Failed - Manual Review: Labs are only required if the patient has taken medication for more than 8 weeks.      Failed - Cr in normal range and within 360 days    Creat  Date Value Ref Range Status  12/05/2022 1.06 (H) 0.60 - 1.00 mg/dL Final   Creatinine, Urine  Date Value Ref Range Status  10/03/2021 203 20 - 275 mg/dL Final         Passed - HGB in normal range and within 360 days    Hemoglobin  Date Value Ref Range Status  08/21/2022 12.0 11.7 - 15.5 g/dL Final  16/05/9603 54.0 11.1 - 15.9 g/dL Final         Passed - HCT in normal range and within 360 days    HCT  Date Value Ref Range Status  08/21/2022 35.4 35.0 - 45.0 % Final   Hematocrit  Date Value Ref Range Status  09/20/2015 38.4 34.0 - 46.6 % Final         Passed - AST in normal range and within 360 days    AST  Date Value Ref Range Status  12/05/2022 20 10 - 35 U/L Final         Passed - ALT in normal range and within 360 days    ALT  Date Value Ref Range Status  12/05/2022 13 6 - 29 U/L Final         Passed - eGFR is 30 or above and within 360 days    GFR, Est African American  Date Value Ref Range Status  09/16/2020 80 > OR = 60 mL/min/1.22m2 Final   GFR, Est Non African American  Date Value Ref Range Status  09/16/2020 69 > OR = 60 mL/min/1.9m2 Final   eGFR  Date Value Ref Range Status  12/05/2022 56 (L) > OR = 60 mL/min/1.99m2 Final         Passed - Patient is not pregnant      Passed - Valid encounter within last 12 months    Recent Outpatient Visits           1 week ago Hypercalcemia   [facility] Danelle Berry, PA-C   2 months ago Primary hypertension   [place] [facility]  Danelle Berry, PA-C   3 months ago Type 2 diabetes mellitus without complication, without long-term current use of insulin (HCC)   [place] [facility] Mecum, Oswaldo Conroy, PA-C   6 months ago MS (multiple sclerosis) [facility])    [facility] Danelle Berry, PA-C   10 months ago Syncope, unspecified syncope type   [facility] Danelle Berry, PA-C       Future Appointments             In 2 months Danelle Berry, PA-C [facility], [facility]

## 2022-12-15 DIAGNOSIS — M5431 Sciatica, right side: Secondary | ICD-10-CM | POA: Diagnosis not present

## 2022-12-15 DIAGNOSIS — M5432 Sciatica, left side: Secondary | ICD-10-CM | POA: Diagnosis not present

## 2022-12-15 DIAGNOSIS — M5451 Vertebrogenic low back pain: Secondary | ICD-10-CM | POA: Diagnosis not present

## 2022-12-15 DIAGNOSIS — M542 Cervicalgia: Secondary | ICD-10-CM | POA: Diagnosis not present

## 2022-12-15 DIAGNOSIS — R262 Difficulty in walking, not elsewhere classified: Secondary | ICD-10-CM | POA: Diagnosis not present

## 2022-12-15 DIAGNOSIS — M530 Cervicocranial syndrome: Secondary | ICD-10-CM | POA: Diagnosis not present

## 2022-12-19 DIAGNOSIS — M5431 Sciatica, right side: Secondary | ICD-10-CM | POA: Diagnosis not present

## 2022-12-19 DIAGNOSIS — M5451 Vertebrogenic low back pain: Secondary | ICD-10-CM | POA: Diagnosis not present

## 2022-12-19 DIAGNOSIS — M5432 Sciatica, left side: Secondary | ICD-10-CM | POA: Diagnosis not present

## 2022-12-19 DIAGNOSIS — M530 Cervicocranial syndrome: Secondary | ICD-10-CM | POA: Diagnosis not present

## 2022-12-19 DIAGNOSIS — R262 Difficulty in walking, not elsewhere classified: Secondary | ICD-10-CM | POA: Diagnosis not present

## 2022-12-19 DIAGNOSIS — M542 Cervicalgia: Secondary | ICD-10-CM | POA: Diagnosis not present

## 2022-12-20 ENCOUNTER — Other Ambulatory Visit: Payer: Self-pay | Admitting: Family Medicine

## 2022-12-20 DIAGNOSIS — Z79891 Long term (current) use of opiate analgesic: Secondary | ICD-10-CM | POA: Diagnosis not present

## 2022-12-20 DIAGNOSIS — Z981 Arthrodesis status: Secondary | ICD-10-CM | POA: Diagnosis not present

## 2022-12-20 DIAGNOSIS — M25551 Pain in right hip: Secondary | ICD-10-CM | POA: Diagnosis not present

## 2022-12-20 DIAGNOSIS — G8928 Other chronic postprocedural pain: Secondary | ICD-10-CM | POA: Diagnosis not present

## 2022-12-20 DIAGNOSIS — M7061 Trochanteric bursitis, right hip: Secondary | ICD-10-CM | POA: Diagnosis not present

## 2022-12-20 DIAGNOSIS — M810 Age-related osteoporosis without current pathological fracture: Secondary | ICD-10-CM

## 2022-12-20 DIAGNOSIS — Z5181 Encounter for therapeutic drug level monitoring: Secondary | ICD-10-CM | POA: Diagnosis not present

## 2022-12-20 DIAGNOSIS — G8929 Other chronic pain: Secondary | ICD-10-CM | POA: Diagnosis not present

## 2022-12-20 DIAGNOSIS — M542 Cervicalgia: Secondary | ICD-10-CM | POA: Diagnosis not present

## 2022-12-20 NOTE — Telephone Encounter (Unsigned)
Copied from CRM [id number]. Topic: General - Other >> Dec 20, 2022  2:30 PM Everette C wrote: Reason for CRM: Medication Refill - Medication: needles   Has the patient contacted their pharmacy? Yes.   (Agent: If no, request that the patient contact the pharmacy for the refill. If patient does not wish to contact the pharmacy document the reason why and proceed with request.) (Agent: If yes, when and what did the pharmacy advise?)  Preferred Pharmacy (with phone number or street name): [facility] [place], [place] - [address] [place] [address] Phone: [phone] Fax: [phone] Hours: Not open 24 hours   Has the patient been seen for an appointment in the last year OR does the patient have an upcoming appointment? Yes.    Agent: Please be advised that RX refills may take up to 3 business days. We ask that you follow-up with your pharmacy.

## 2022-12-21 MED ORDER — INSULIN PEN NEEDLE 32G X 4 MM MISC
2 refills | Status: DC
Start: 2022-12-21 — End: 2023-12-24

## 2022-12-21 NOTE — Telephone Encounter (Signed)
Requested Prescriptions  Pending Prescriptions Disp Refills   Insulin Pen Needle 32G X 4 MM MISC 90 each 2    Sig: Use as directed with forteo injection daily     Endocrinology: Diabetes - Testing Supplies Passed - 12/20/2022  3:33 PM      Passed - Valid encounter within last 12 months    Recent Outpatient Visits           2 weeks ago Hypercalcemia   [facility] Danelle Berry, PA-C   3 months ago Primary hypertension   [place] [facility] Danelle Berry, PA-C   4 months ago Type 2 diabetes mellitus without complication, without long-term current use of insulin (HCC)   [place] [facility] Mecum, Oswaldo Conroy, PA-C   7 months ago MS (multiple sclerosis) [facility])   [facility] Danelle Berry, PA-C   10 months ago Syncope, unspecified syncope type   [facility] Danelle Berry, PA-C       Future Appointments             In 2 months Danelle Berry, PA-C [facility], [facility]

## 2022-12-26 DIAGNOSIS — M5451 Vertebrogenic low back pain: Secondary | ICD-10-CM | POA: Diagnosis not present

## 2022-12-26 DIAGNOSIS — R262 Difficulty in walking, not elsewhere classified: Secondary | ICD-10-CM | POA: Diagnosis not present

## 2022-12-26 DIAGNOSIS — M542 Cervicalgia: Secondary | ICD-10-CM | POA: Diagnosis not present

## 2022-12-26 DIAGNOSIS — M530 Cervicocranial syndrome: Secondary | ICD-10-CM | POA: Diagnosis not present

## 2022-12-26 DIAGNOSIS — M5431 Sciatica, right side: Secondary | ICD-10-CM | POA: Diagnosis not present

## 2022-12-26 DIAGNOSIS — M5432 Sciatica, left side: Secondary | ICD-10-CM | POA: Diagnosis not present

## 2022-12-27 ENCOUNTER — Other Ambulatory Visit: Payer: Self-pay | Admitting: Physician Assistant

## 2022-12-27 ENCOUNTER — Other Ambulatory Visit: Payer: Self-pay | Admitting: Nurse Practitioner

## 2022-12-27 DIAGNOSIS — M797 Fibromyalgia: Secondary | ICD-10-CM

## 2022-12-27 DIAGNOSIS — G629 Polyneuropathy, unspecified: Secondary | ICD-10-CM

## 2022-12-27 DIAGNOSIS — J4521 Mild intermittent asthma with (acute) exacerbation: Secondary | ICD-10-CM

## 2022-12-27 DIAGNOSIS — G8929 Other chronic pain: Secondary | ICD-10-CM

## 2022-12-27 NOTE — Telephone Encounter (Signed)
Requested Prescriptions  Pending Prescriptions Disp Refills   montelukast (SINGULAIR) 10 MG tablet [Pharmacy Med Name: MONTELUKAST SOD 10 MG TABLET] 90 tablet 0    Sig: Take 1 tablet (10 mg total) by mouth at bedtime.     Pulmonology:  Leukotriene Inhibitors Passed - 12/27/2022 10:40 AM      Passed - Valid encounter within last 12 months    Recent Outpatient Visits           3 weeks ago Hypercalcemia   [facility] Danelle Berry, PA-C   3 months ago Primary hypertension   [place] [facility] Danelle Berry, PA-C   4 months ago Type 2 diabetes mellitus without complication, without long-term current use of insulin (HCC)   [place] [facility] Mecum, Oswaldo Conroy, PA-C   7 months ago MS (multiple sclerosis) [facility])   [facility] Danelle Berry, PA-C   10 months ago Syncope, unspecified syncope type   [facility] Danelle Berry, PA-C       Future Appointments             In 1 month Danelle Berry, PA-C [facility], [facility]

## 2022-12-27 NOTE — Telephone Encounter (Signed)
Requested medication (s) are due for refill today:   Provider to review  Requested medication (s) are on the active medication list:   Yes  Future visit scheduled:   Yes   Last ordered: 10/07/2022 #180, 0 refills  Non delegated refill    Requested Prescriptions  Pending Prescriptions Disp Refills   pregabalin (LYRICA) 100 MG capsule [Pharmacy Med Name: PREGABALIN 100 MG CAPSULE] 180 capsule 0    Sig: Take 1 capsule (100 mg total) by mouth in the morning AND 2 capsules (200 mg total) at bedtime.     Not Delegated - Neurology:  Anticonvulsants - Controlled - pregabalin Failed - 12/27/2022 10:40 AM      Failed - This refill cannot be delegated      Failed - Cr in normal range and within 360 days    Creat  Date Value Ref Range Status  12/05/2022 1.06 (H) 0.60 - 1.00 mg/dL Final   Creatinine, Urine  Date Value Ref Range Status  10/03/2021 203 20 - 275 mg/dL Final         Passed - Completed PHQ-2 or PHQ-9 in the last 360 days      Passed - Valid encounter within last 12 months    Recent Outpatient Visits           3 weeks ago Hypercalcemia   [facility] Danelle Berry, PA-C   3 months ago Primary hypertension   [place] [facility] [facility] Danelle Berry, PA-C   4 months ago Type 2 diabetes mellitus without complication, without long-term current use of insulin (HCC)   [place] [facility] Mecum, Oswaldo Conroy, PA-C   7 months ago MS (multiple sclerosis) [facility])   [place] [facility] Danelle Berry, PA-C   10 months ago Syncope, unspecified syncope type   [facility] Danelle Berry, PA-C       Future Appointments             In 1 month Danelle Berry, PA-C [facility], [facility]

## 2022-12-28 DIAGNOSIS — M5432 Sciatica, left side: Secondary | ICD-10-CM | POA: Diagnosis not present

## 2022-12-28 DIAGNOSIS — M530 Cervicocranial syndrome: Secondary | ICD-10-CM | POA: Diagnosis not present

## 2022-12-28 DIAGNOSIS — M5431 Sciatica, right side: Secondary | ICD-10-CM | POA: Diagnosis not present

## 2022-12-28 DIAGNOSIS — M542 Cervicalgia: Secondary | ICD-10-CM | POA: Diagnosis not present

## 2022-12-28 DIAGNOSIS — R262 Difficulty in walking, not elsewhere classified: Secondary | ICD-10-CM | POA: Diagnosis not present

## 2022-12-28 DIAGNOSIS — M5451 Vertebrogenic low back pain: Secondary | ICD-10-CM | POA: Diagnosis not present

## 2023-01-02 DIAGNOSIS — M4316 Spondylolisthesis, lumbar region: Secondary | ICD-10-CM | POA: Diagnosis not present

## 2023-01-02 DIAGNOSIS — Z981 Arthrodesis status: Secondary | ICD-10-CM | POA: Diagnosis not present

## 2023-01-02 DIAGNOSIS — M160 Bilateral primary osteoarthritis of hip: Secondary | ICD-10-CM | POA: Diagnosis not present

## 2023-01-02 DIAGNOSIS — M47816 Spondylosis without myelopathy or radiculopathy, lumbar region: Secondary | ICD-10-CM | POA: Diagnosis not present

## 2023-01-02 DIAGNOSIS — M47814 Spondylosis without myelopathy or radiculopathy, thoracic region: Secondary | ICD-10-CM | POA: Diagnosis not present

## 2023-01-02 DIAGNOSIS — M47812 Spondylosis without myelopathy or radiculopathy, cervical region: Secondary | ICD-10-CM | POA: Diagnosis not present

## 2023-01-03 DIAGNOSIS — R262 Difficulty in walking, not elsewhere classified: Secondary | ICD-10-CM | POA: Diagnosis not present

## 2023-01-03 DIAGNOSIS — M530 Cervicocranial syndrome: Secondary | ICD-10-CM | POA: Diagnosis not present

## 2023-01-03 DIAGNOSIS — M5432 Sciatica, left side: Secondary | ICD-10-CM | POA: Diagnosis not present

## 2023-01-03 DIAGNOSIS — M5431 Sciatica, right side: Secondary | ICD-10-CM | POA: Diagnosis not present

## 2023-01-03 DIAGNOSIS — M5451 Vertebrogenic low back pain: Secondary | ICD-10-CM | POA: Diagnosis not present

## 2023-01-03 DIAGNOSIS — M542 Cervicalgia: Secondary | ICD-10-CM | POA: Diagnosis not present

## 2023-01-05 DIAGNOSIS — M542 Cervicalgia: Secondary | ICD-10-CM | POA: Diagnosis not present

## 2023-01-05 DIAGNOSIS — M5431 Sciatica, right side: Secondary | ICD-10-CM | POA: Diagnosis not present

## 2023-01-05 DIAGNOSIS — M530 Cervicocranial syndrome: Secondary | ICD-10-CM | POA: Diagnosis not present

## 2023-01-05 DIAGNOSIS — R262 Difficulty in walking, not elsewhere classified: Secondary | ICD-10-CM | POA: Diagnosis not present

## 2023-01-05 DIAGNOSIS — M5451 Vertebrogenic low back pain: Secondary | ICD-10-CM | POA: Diagnosis not present

## 2023-01-05 DIAGNOSIS — M5432 Sciatica, left side: Secondary | ICD-10-CM | POA: Diagnosis not present

## 2023-01-08 ENCOUNTER — Other Ambulatory Visit: Payer: Self-pay | Admitting: Family Medicine

## 2023-01-08 ENCOUNTER — Other Ambulatory Visit: Payer: Self-pay

## 2023-01-08 DIAGNOSIS — R1310 Dysphagia, unspecified: Secondary | ICD-10-CM

## 2023-01-08 DIAGNOSIS — M810 Age-related osteoporosis without current pathological fracture: Secondary | ICD-10-CM

## 2023-01-08 DIAGNOSIS — M26629 Arthralgia of temporomandibular joint, unspecified side: Secondary | ICD-10-CM

## 2023-01-08 DIAGNOSIS — Z9889 Other specified postprocedural states: Secondary | ICD-10-CM

## 2023-01-09 MED ORDER — TERIPARATIDE 600 MCG/2.4ML ~~LOC~~ SOPN
PEN_INJECTOR | SUBCUTANEOUS | 5 refills | Status: DC
Start: 2023-01-09 — End: 2023-05-28

## 2023-01-11 ENCOUNTER — Telehealth: Payer: Self-pay | Admitting: Family Medicine

## 2023-01-11 NOTE — Telephone Encounter (Signed)
Copied from CRM [id number]. Topic: General - Other >> Jan 11, 2023 11:10 AM Franchot Heidelberg wrote: Reason for CRM: Pt's daughter Marchelle Folks called to report that [facility] told her to contact PCP to have this refill submitted. She is asking for someone from the clinic to call [facility] and resolve this. We have receipt confirming that they received this.   Please advise once completed [phone]

## 2023-01-11 NOTE — Telephone Encounter (Signed)
Called pt to ask what was needed and she stated she need a new rx for Forteo to be sent to [facility]. On 01/09/23 a new rx was sent to pharmacy. I advised pt to call them back notifiy them new rx was sent on 01/09/23

## 2023-01-22 ENCOUNTER — Other Ambulatory Visit: Payer: Self-pay | Admitting: Family Medicine

## 2023-01-22 DIAGNOSIS — K219 Gastro-esophageal reflux disease without esophagitis: Secondary | ICD-10-CM

## 2023-02-12 DIAGNOSIS — I38 Endocarditis, valve unspecified: Secondary | ICD-10-CM | POA: Diagnosis not present

## 2023-02-12 DIAGNOSIS — I10 Essential (primary) hypertension: Secondary | ICD-10-CM | POA: Diagnosis not present

## 2023-02-12 DIAGNOSIS — E782 Mixed hyperlipidemia: Secondary | ICD-10-CM | POA: Diagnosis not present

## 2023-02-12 DIAGNOSIS — R079 Chest pain, unspecified: Secondary | ICD-10-CM | POA: Diagnosis not present

## 2023-02-13 DIAGNOSIS — M542 Cervicalgia: Secondary | ICD-10-CM | POA: Diagnosis not present

## 2023-02-13 DIAGNOSIS — R262 Difficulty in walking, not elsewhere classified: Secondary | ICD-10-CM | POA: Diagnosis not present

## 2023-02-13 DIAGNOSIS — M5431 Sciatica, right side: Secondary | ICD-10-CM | POA: Diagnosis not present

## 2023-02-13 DIAGNOSIS — M5432 Sciatica, left side: Secondary | ICD-10-CM | POA: Diagnosis not present

## 2023-02-13 DIAGNOSIS — M530 Cervicocranial syndrome: Secondary | ICD-10-CM | POA: Diagnosis not present

## 2023-02-13 DIAGNOSIS — M5451 Vertebrogenic low back pain: Secondary | ICD-10-CM | POA: Diagnosis not present

## 2023-02-16 DIAGNOSIS — M5432 Sciatica, left side: Secondary | ICD-10-CM | POA: Diagnosis not present

## 2023-02-16 DIAGNOSIS — M530 Cervicocranial syndrome: Secondary | ICD-10-CM | POA: Diagnosis not present

## 2023-02-16 DIAGNOSIS — M542 Cervicalgia: Secondary | ICD-10-CM | POA: Diagnosis not present

## 2023-02-16 DIAGNOSIS — M5451 Vertebrogenic low back pain: Secondary | ICD-10-CM | POA: Diagnosis not present

## 2023-02-16 DIAGNOSIS — R262 Difficulty in walking, not elsewhere classified: Secondary | ICD-10-CM | POA: Diagnosis not present

## 2023-02-16 DIAGNOSIS — M5431 Sciatica, right side: Secondary | ICD-10-CM | POA: Diagnosis not present

## 2023-02-19 ENCOUNTER — Encounter: Payer: Self-pay | Admitting: Family Medicine

## 2023-02-19 ENCOUNTER — Ambulatory Visit: Payer: Medicare PPO | Admitting: Family Medicine

## 2023-02-19 VITALS — BP 164/82 | HR 80 | Temp 97.7°F | Resp 16 | Ht 64.0 in | Wt 194.7 lb

## 2023-02-19 DIAGNOSIS — I10 Essential (primary) hypertension: Secondary | ICD-10-CM | POA: Diagnosis not present

## 2023-02-19 DIAGNOSIS — E1169 Type 2 diabetes mellitus with other specified complication: Secondary | ICD-10-CM | POA: Diagnosis not present

## 2023-02-19 DIAGNOSIS — E785 Hyperlipidemia, unspecified: Secondary | ICD-10-CM

## 2023-02-19 DIAGNOSIS — Z5181 Encounter for therapeutic drug level monitoring: Secondary | ICD-10-CM

## 2023-02-19 DIAGNOSIS — E039 Hypothyroidism, unspecified: Secondary | ICD-10-CM | POA: Diagnosis not present

## 2023-02-19 DIAGNOSIS — F3342 Major depressive disorder, recurrent, in full remission: Secondary | ICD-10-CM

## 2023-02-19 DIAGNOSIS — J310 Chronic rhinitis: Secondary | ICD-10-CM | POA: Diagnosis not present

## 2023-02-19 DIAGNOSIS — Z7984 Long term (current) use of oral hypoglycemic drugs: Secondary | ICD-10-CM

## 2023-02-19 DIAGNOSIS — M797 Fibromyalgia: Secondary | ICD-10-CM | POA: Diagnosis not present

## 2023-02-19 DIAGNOSIS — E119 Type 2 diabetes mellitus without complications: Secondary | ICD-10-CM

## 2023-02-19 DIAGNOSIS — R7303 Prediabetes: Secondary | ICD-10-CM

## 2023-02-19 DIAGNOSIS — I38 Endocarditis, valve unspecified: Secondary | ICD-10-CM | POA: Diagnosis not present

## 2023-02-19 DIAGNOSIS — K219 Gastro-esophageal reflux disease without esophagitis: Secondary | ICD-10-CM

## 2023-02-19 LAB — CBC WITH DIFFERENTIAL/PLATELET
Eosinophils Relative: 4 %
Hemoglobin: 11.6 g/dL — ABNORMAL LOW (ref 11.7–15.5)
MCHC: 33 g/dL (ref 32.0–36.0)
Monocytes Relative: 9.8 %
Neutro Abs: 2993 cells/uL (ref 1500–7800)
Neutrophils Relative %: 52.5 %
RBC: 3.89 10*6/uL (ref 3.80–5.10)
RDW: 13.1 % (ref 11.0–15.0)

## 2023-02-19 MED ORDER — HYDRALAZINE HCL 10 MG PO TABS
10.0000 mg | ORAL_TABLET | Freq: Two times a day (BID) | ORAL | 1 refills | Status: DC | PRN
Start: 2023-02-19 — End: 2024-05-22

## 2023-02-19 MED ORDER — METFORMIN HCL ER 500 MG PO TB24
500.0000 mg | ORAL_TABLET | Freq: Two times a day (BID) | ORAL | 3 refills | Status: DC
Start: 2023-02-19 — End: 2024-02-22

## 2023-02-19 MED ORDER — IPRATROPIUM BROMIDE 0.03 % NA SOLN
2.0000 | Freq: Two times a day (BID) | NASAL | 12 refills | Status: AC
Start: 2023-02-19 — End: ?

## 2023-02-19 NOTE — Progress Notes (Signed)
Name: Brittany Hill   MRN: [id number]    DOB: 09-Mar-1951   Date:02/19/2023       Progress Note  Chief Complaint  Patient presents with   Follow-up   Hyperlipidemia   Hypothyroidism   Diabetes   Depression     Subjective:   Brittany Hill is a 72 y.o. female, presents to clinic for routine f/up   HTN on spironolactone and BB BPS higher recently with increased pain She was doing hydralazine 10 mg only once a day   On forteo meds for strengthening bone density prior to next spine surgery She is experiencing achy bones and increased chronic pain, is seeing ortho and [facility] Overall pain is worse day to day   DM:   Pt managing DM with metformin 500 mg bid Reports good med compliance Pt has no SE from meds. Blood sugars not checking Denies: Polyuria, polydipsia, vision changes, neuropathy, hypoglycemia Recent pertinent labs: Lab Results  Component Value Date   HGBA1C 6.2 (H) 08/21/2022   HGBA1C 5.9 (H) 12/30/2021   HGBA1C 6.0 (H) 10/03/2021   HTN - BP high on spironolactone per cardiology, BB, hydralzine 10 mg up to BID prn BP Readings from Last 6 Encounters:  02/19/23 (!) 164/82  12/05/22 106/62  09/19/22 120/70  08/21/22 114/70  02/16/22 (!) 104/58   HLD on zetia-simvastatin- lipids done earlier this year Lab Results  Component Value Date   CHOL 154 08/21/2022   HDL 49 (L) 08/21/2022   LDLCALC 79 08/21/2022   TRIG 163 (H) 08/21/2022   CHOLHDL 3.1 08/21/2022   Hypothyroid- on levothyroxine 75 mg daily     Current Outpatient Medications:    acetaminophen (TYLENOL) 500 MG tablet, Take 1,000 mg by mouth every 6 (six) hours as needed., Disp: , Rfl:    albuterol (VENTOLIN HFA) 108 (90 Base) MCG/ACT inhaler, Inhale 2 puffs into the lungs every 6 (six) hours as needed for wheezing or shortness of breath., Disp: 18 g, Rfl: 0   cholecalciferol (VITAMIN D3) 25 MCG (1000 UNIT) tablet, Take 1,000 Units by mouth daily., Disp: , Rfl:    DULoxetine  (CYMBALTA) 20 MG capsule, Take 2 CAPSULES IN THE MORNING AND ONE IN THE EVENING., Disp: 270 capsule, Rfl: 2   EPINEPHrine 0.3 mg/0.3 mL IJ SOAJ injection, Inject 0.3 mg into the muscle once. , Disp: , Rfl:    ezetimibe-simvastatin (VYTORIN) 10-40 MG tablet, Take 1 tablet by mouth at bedtime., Disp: 90 tablet, Rfl: 2   famotidine (PEPCID) 20 MG tablet, Take 1 tablet (20 mg total) by mouth 2 (two) times daily as needed for heartburn or indigestion., Disp: 60 tablet, Rfl: 5   hydrALAZINE (APRESOLINE) 10 MG tablet, Take 1 tablet (10 mg total) by mouth 2 (two) times daily as needed (take for sustained BP >160/100)., Disp: 30 tablet, Rfl: 1   Insulin Pen Needle 32G X 4 MM MISC, Use as directed with forteo injection daily, Disp: 90 each, Rfl: 2   ipratropium (ATROVENT) 0.03 % nasal spray, Place 2 sprays into both nostrils every 12 (twelve) hours., Disp: 30 mL, Rfl: 12   levothyroxine (SYNTHROID) 75 MCG tablet, Take 1 tablet (75 mcg total) by mouth daily before breakfast., Disp: 90 tablet, Rfl: 2   meloxicam (MOBIC) 15 MG tablet, Take 1 tablet (15 mg total) by mouth daily., Disp: 90 tablet, Rfl: 0   metFORMIN (GLUCOPHAGE-XR) 500 MG 24 hr tablet, Take 1 tablet (500 mg total) by mouth 2 (two) times daily., Disp:  180 tablet, Rfl: 3   metoprolol succinate (TOPROL-XL) 50 MG 24 hr tablet, Take 1 tablet (50 mg total) by mouth once daily, Disp: 90 tablet, Rfl: 1   montelukast (SINGULAIR) 10 MG tablet, Take 1 tablet (10 mg total) by mouth at bedtime., Disp: 90 tablet, Rfl: 1   naloxone (NARCAN) nasal spray 4 mg/0.1 mL, Place into the nose., Disp: , Rfl:    ondansetron (ZOFRAN ODT) 4 MG disintegrating tablet, Take 1 tablet (4 mg total) by mouth every 8 (eight) hours as needed for nausea or vomiting., Disp: 20 tablet, Rfl: 1   oxyCODONE (OXY IR/ROXICODONE) 5 MG immediate release tablet, Take 5 mg by mouth 2 (two) times daily as needed., Disp: , Rfl:    oxycodone (OXY-IR) 5 MG capsule, Take 5 mg by mouth every 6 (six)  hours as needed., Disp: , Rfl:    pantoprazole (PROTONIX) 40 MG tablet, Take 1 tablet (40 mg total) by mouth at bedtime., Disp: 90 tablet, Rfl: 0   pregabalin (LYRICA) 100 MG capsule, Take 1 capsule (100 mg total) by mouth in the morning AND 2 capsules (200 mg total) at bedtime., Disp: 270 capsule, Rfl: 1   spironolactone (ALDACTONE) 25 MG tablet, Take 1 tablet (25 mg total) by mouth daily. With breakfast, Disp: 90 tablet, Rfl: 0   Teriparatide, Recombinant, (FORTEO) 600 MCG/2.4ML SOPN, Inject 20 mcg subQ once daily, Disp: 2.4 mL, Rfl: 5   tiZANidine (ZANAFLEX) 4 MG tablet, Take 2 tablets (8 mg total) by mouth 3 (three) times daily., Disp: 540 tablet, Rfl: 1   XTAMPZA ER 9 MG C12A, Take 1 capsule by mouth daily., Disp: , Rfl:    calcium carbonate (OS-CAL) 600 MG TABS tablet, Take 600 mg by mouth as needed.  (Patient not taking: Reported on 09/19/2022), Disp: , Rfl:    fluticasone-salmeterol (ADVAIR) 100-50 MCG/ACT AEPB, Inhale 1 puff into the lungs 2 (two) times daily. (Patient not taking: Reported on 09/19/2022), Disp: 1 each, Rfl: 1  Patient Active Problem List   Diagnosis Date Noted   History of total knee replacement, bilateral 05/19/2022   At moderate risk for fall 05/19/2022   Syncope 01/17/2022   Dyslipidemia associated with type 2 diabetes mellitus (HCC) 07/12/2021   Acquired stenosis of bilateral nasolacrimal duct 02/08/2021   Type 2 diabetes mellitus without complication, without long-term current use of insulin (HCC) 03/22/2020   Acquired trigger finger 01/06/2020   Bouchard's nodes (with arthropathy) 01/06/2020   Chronic post-operative pain 09/05/2019   Neck pain 09/05/2019   Cervical radiculopathy 09/05/2019   Displacement of cervical intervertebral disc 05/14/2019   Fatty liver disease, nonalcoholic 03/28/2019   Osteopenia 10/20/2018   Obesity (BMI 30.0-34.9) 09/27/2018   Cataract, left 04/02/2018   Osteoarthritis of left ankle 02/16/2018   Asthma without status asthmaticus  10/05/2017   Genetic testing 08/28/2017   Family hx-breast malignancy 08/02/2017   Dense breast tissue on mammogram 07/28/2016   Left sided sciatica 12/19/2015   Left lumbar radiculopathy 12/16/2015   Hypomagnesemia 12/06/2015   Hypokalemia 12/02/2015   Fibromyalgia    MS (multiple sclerosis) (HCC)    Chronic constipation    OA (osteoarthritis)    Hypothyroidism    IFG (impaired fasting glucose)    Hypertension    Hyperlipidemia    GERD (gastroesophageal reflux disease)    MDD (major depressive disorder), recurrent, in full remission (HCC)    Anxiety    TMJ syndrome     Past Surgical History:  Procedure Laterality Date   ABDOMINAL  HYSTERECTOMY     complete at age 76; HRT for ~25y   BACK SURGERY     CATARACT EXTRACTION W/PHACO Left 09/27/2017   Procedure: CATARACT EXTRACTION PHACO AND INTRAOCULAR LENS PLACEMENT (IOC);  Surgeon: Nevada Crane, MD;  Location: [facility] OR[facility];  Service: Ophthalmology;  Laterality: Left;  Lot #[id number] H [place]:   00:27.2 AP%:  13.7 CDE:  3.75   CATARACT EXTRACTION W/PHACO Right 10/25/2017   Procedure: CATARACT EXTRACTION PHACO AND INTRAOCULAR LENS PLACEMENT (IOC);  Surgeon: Nevada Crane, MD;  Location: [facility];  Service: Ophthalmology;  Laterality: Right;  Lot # [id number] H [place]:   00:23.9 AP%   :8.7 CDE:   2.10   Cataracts Bilateral    09/27/17 and 10/25/17   CHOLECYSTECTOMY  05/27/2020   COLONOSCOPY WITH ESOPHAGOGASTRODUODENOSCOPY (EGD)  12/2017   [facility]   EYE SURGERY  2022   JOINT REPLACEMENT     KNEE ARTHROSCOPY Right    x 2   lumbarectomy with fusion  12/06/2015   ROTATOR CUFF REPAIR     SPINE SURGERY  2018?   tear duct      surgery   TMJ ARTHROPLASTY     TOTAL KNEE ARTHROPLASTY Bilateral 09/2007    Family History  Problem Relation Age of Onset   Heart disease Mother    Hypertension Mother    Lymphoma Mother 79       deceased 49   Arthritis Mother    Cancer Mother    Varicose Veins Mother    Stroke Father     Heart attack Father    Heart disease Father    Hypertension Father    Alcohol abuse Father    Early death Father    Hypertension Sister    Colon cancer Sister 59   Cancer Sister 34   Miscarriages / Stillbirths Sister    Hypertension Brother    Diabetes Brother    Mental illness Brother    Depression Brother    Lung cancer Brother 91       smoker; currently 22   Cancer Brother    Learning disabilities Brother    Alcohol abuse Son    Breast cancer Maternal Aunt 21       currently 59   Breast cancer Maternal Aunt        dx 42s; mets in 42s   AAA (abdominal aortic aneurysm) Maternal Grandmother    Heart disease Maternal Grandmother    Heart attack Paternal Grandfather    Throat cancer Paternal Uncle        smoker    Social History   Tobacco Use   Smoking status: Never   Smokeless tobacco: Never  Vaping Use   Vaping status: Never Used  Substance Use Topics   Alcohol use: No   Drug use: No     Allergies  Allergen Reactions   Shellfish Allergy Rash and Swelling   Codeine Nausea And Vomiting   Sulfa Antibiotics Swelling    Health Maintenance  Topic Date Due   Diabetic kidney evaluation - Urine ACR  10/03/2022   FOOT EXAM  10/03/2022   HEMOGLOBIN A1C  02/19/2023   OPHTHALMOLOGY EXAM  02/19/2023 (Originally 01/25/2023)   Zoster Vaccines- Shingrix (1 of 2) 03/06/2023 (Originally 05/09/1970)   COVID-19 Vaccine (6 - 2023-24 season) 03/07/2023 (Originally 04/07/2022)   Colonoscopy  02/19/2024 (Originally 01/11/2023)   INFLUENZA VACCINE  03/08/2023   Medicare Annual Wellness (AWV)  10/05/2023   MAMMOGRAM  11/27/2023   Diabetic kidney evaluation -  eGFR measurement  12/05/2023   DEXA SCAN  11/26/2024   DTaP/Tdap/Td (3 - Td or Tdap) 03/27/2030   Pneumonia Vaccine 35+ Years old  Completed   Hepatitis C Screening  Addressed   HPV VACCINES  Aged Out    Chart Review Today: I personally reviewed active problem list, medication list, allergies, family history, social history,  health maintenance, notes from last encounter, lab results, imaging with the patient/caregiver today.   Review of Systems  Constitutional: Negative.   HENT: Negative.    Eyes: Negative.   Respiratory: Negative.    Cardiovascular: Negative.   Gastrointestinal: Negative.   Endocrine: Negative.   Genitourinary: Negative.   Musculoskeletal: Negative.   Skin: Negative.   Allergic/Immunologic: Negative.   Neurological: Negative.   Hematological: Negative.   Psychiatric/Behavioral: Negative.    All other systems reviewed and are negative.    Objective:   Vitals:   02/19/23 1049 02/19/23 1137  BP: (!) 152/86 (!) 164/82  Pulse: 80   Resp: 16   Temp: 97.7 F (36.5 C)   TempSrc: Oral   SpO2: 98%   Weight: 194 lb 11.2 oz (88.3 kg)   Height: 5\' 4"  (1.626 m)     Body mass index is 33.42 kg/m.  Physical Exam Vitals and nursing note reviewed.  Constitutional:      General: She is not in acute distress.    Appearance: Normal appearance. She is well-developed. She is not ill-appearing, toxic-appearing or diaphoretic.  HENT:     Head: Normocephalic and atraumatic.     Nose: Nose normal.  Eyes:     General:        Right eye: No discharge.        Left eye: No discharge.     Conjunctiva/sclera: Conjunctivae normal.  Neck:     Trachea: No tracheal deviation.  Cardiovascular:     Rate and Rhythm: Normal rate and regular rhythm.     Pulses: Normal pulses.     Heart sounds: Normal heart sounds. No murmur heard.    No friction rub. No gallop.  Pulmonary:     Effort: Pulmonary effort is normal. No respiratory distress.     Breath sounds: Normal breath sounds. No stridor.  Musculoskeletal:        General: Normal range of motion.     Cervical back: Rigidity present.  Skin:    General: Skin is warm and dry.     Findings: No rash.  Neurological:     Mental Status: She is alert.     Motor: No abnormal muscle tone.     Coordination: Coordination normal.     Gait: Gait abnormal.   Psychiatric:        Behavior: Behavior normal.         Assessment & Plan:   Problem List Items Addressed This Visit       Cardiovascular and Mediastinum   Hypertension (Chronic)    BP elevated, likely due to pain She can continue using hydralazine for BP >160 Recommend she come in with home BP readings and log for further review      Relevant Medications   hydrALAZINE (APRESOLINE) 10 MG tablet   Other Relevant Orders   COMPLETE METABOLIC PANEL WITH GFR (Completed)     Digestive   GERD (gastroesophageal reflux disease) (Chronic)    Sx stable, well controlled        Endocrine   Hypothyroidism (Chronic)    Recheck TSH On 75 mcg dose daily  Relevant Orders   TSH (Completed)   Type 2 diabetes mellitus without complication, without long-term current use of insulin (HCC) - Primary    Has been well controlled on metformin, due for labs      Relevant Medications   metFORMIN (GLUCOPHAGE-XR) 500 MG 24 hr tablet   Other Relevant Orders   Microalbumin / creatinine urine ratio (Completed)   HM DIABETES FOOT EXAM (Completed)   COMPLETE METABOLIC PANEL WITH GFR (Completed)   Hemoglobin A1c (Completed)   Dyslipidemia associated with type 2 diabetes mellitus (HCC)    Has been well controlled on statin-zetia combo pill      Relevant Medications   metFORMIN (GLUCOPHAGE-XR) 500 MG 24 hr tablet   Other Relevant Orders   COMPLETE METABOLIC PANEL WITH GFR (Completed)     Other   Fibromyalgia (Chronic)    On lyrica and cymbalta      Relevant Medications   oxyCODONE (OXY IR/ROXICODONE) 5 MG immediate release tablet   XTAMPZA ER 9 MG C12A   MDD (major depressive disorder), recurrent, in full remission (HCC)       03/12/2023   11:24 AM 02/19/2023   10:49 AM 12/05/2022    2:52 PM  Depression screen PHQ 2/9  Decreased Interest 1 2 0  Down, Depressed, Hopeless 1 2 0  PHQ - 2 Score 2 4 0  Altered sleeping 1 1 0  Tired, decreased energy 1 1 0  Change in appetite 0 0 0   Feeling bad or failure about yourself  0 0 0  Trouble concentrating 0 0 0  Moving slowly or fidgety/restless 0 0 0  Suicidal thoughts 0 0 0  PHQ-9 Score 4 6 0  Difficult doing work/chores Somewhat difficult Somewhat difficult Not difficult at all  Reviewed, on cymbalta       Other Visit Diagnoses     Encounter for medication monitoring       Relevant Orders   Microalbumin / creatinine urine ratio (Completed)   HM DIABETES FOOT EXAM (Completed)   COMPLETE METABOLIC PANEL WITH GFR (Completed)   TSH (Completed)   CBC with Differential/Platelet (Completed)   Chronic rhinitis       refill on nasal spray   Relevant Medications   ipratropium (ATROVENT) 0.03 % nasal spray        Return for 2-3 week BP recheck and review echo/meds.   Danelle Berry, PA-C 02/19/23 11:12 AM

## 2023-02-20 DIAGNOSIS — M5451 Vertebrogenic low back pain: Secondary | ICD-10-CM | POA: Diagnosis not present

## 2023-02-20 DIAGNOSIS — M530 Cervicocranial syndrome: Secondary | ICD-10-CM | POA: Diagnosis not present

## 2023-02-20 DIAGNOSIS — M5432 Sciatica, left side: Secondary | ICD-10-CM | POA: Diagnosis not present

## 2023-02-20 DIAGNOSIS — R262 Difficulty in walking, not elsewhere classified: Secondary | ICD-10-CM | POA: Diagnosis not present

## 2023-02-20 DIAGNOSIS — M542 Cervicalgia: Secondary | ICD-10-CM | POA: Diagnosis not present

## 2023-02-20 DIAGNOSIS — M5431 Sciatica, right side: Secondary | ICD-10-CM | POA: Diagnosis not present

## 2023-02-20 LAB — TSH: TSH: 3.52 mIU/L (ref 0.40–4.50)

## 2023-02-20 LAB — COMPLETE METABOLIC PANEL WITH GFR
AG Ratio: 1.9 (calc) (ref 1.0–2.5)
ALT: 13 U/L (ref 6–29)
AST: 18 U/L (ref 10–35)
Albumin: 4.8 g/dL (ref 3.6–5.1)
Alkaline phosphatase (APISO): 40 U/L (ref 37–153)
BUN: 11 mg/dL (ref 7–25)
CO2: 29 mmol/L (ref 20–32)
Calcium: 10.2 mg/dL (ref 8.6–10.4)
Chloride: 98 mmol/L (ref 98–110)
Creat: 0.86 mg/dL (ref 0.60–1.00)
Globulin: 2.5 g/dL (calc) (ref 1.9–3.7)
Glucose, Bld: 84 mg/dL (ref 65–99)
Potassium: 4.8 mmol/L (ref 3.5–5.3)
Sodium: 134 mmol/L — ABNORMAL LOW (ref 135–146)
Total Bilirubin: 0.3 mg/dL (ref 0.2–1.2)
Total Protein: 7.3 g/dL (ref 6.1–8.1)
eGFR: 72 mL/min/{1.73_m2} (ref >=60)

## 2023-02-20 LAB — CBC WITH DIFFERENTIAL/PLATELET
Absolute Monocytes: 559 cells/uL (ref 200–950)
Basophils Absolute: 29 cells/uL (ref 0–200)
Basophils Relative: 0.5 %
Eosinophils Absolute: 228 cells/uL (ref 15–500)
HCT: 35.2 % (ref 35.0–45.0)
Lymphs Abs: 1892 cells/uL (ref 850–3900)
MCH: 29.8 pg (ref 27.0–33.0)
MCV: 90.5 fL (ref 80.0–100.0)
MPV: 10.4 fL (ref 7.5–12.5)
Platelets: 208 10*3/uL (ref 140–400)
Total Lymphocyte: 33.2 %
WBC: 5.7 10*3/uL (ref 3.8–10.8)

## 2023-02-20 LAB — HEMOGLOBIN A1C
Hgb A1c MFr Bld: 6.3 % of total Hgb — ABNORMAL HIGH (ref <5.7)
Mean Plasma Glucose: 134 mg/dL
eAG (mmol/L): 7.4 mmol/L

## 2023-02-20 LAB — MICROALBUMIN / CREATININE URINE RATIO
Creatinine, Urine: 80 mg/dL (ref 20–275)
Microalb, Ur: <0.2 mg/dL

## 2023-02-23 DIAGNOSIS — M542 Cervicalgia: Secondary | ICD-10-CM | POA: Diagnosis not present

## 2023-02-23 DIAGNOSIS — R262 Difficulty in walking, not elsewhere classified: Secondary | ICD-10-CM | POA: Diagnosis not present

## 2023-02-23 DIAGNOSIS — M530 Cervicocranial syndrome: Secondary | ICD-10-CM | POA: Diagnosis not present

## 2023-02-23 DIAGNOSIS — M5451 Vertebrogenic low back pain: Secondary | ICD-10-CM | POA: Diagnosis not present

## 2023-02-23 DIAGNOSIS — M5431 Sciatica, right side: Secondary | ICD-10-CM | POA: Diagnosis not present

## 2023-02-23 DIAGNOSIS — M5432 Sciatica, left side: Secondary | ICD-10-CM | POA: Diagnosis not present

## 2023-02-26 DIAGNOSIS — M530 Cervicocranial syndrome: Secondary | ICD-10-CM | POA: Diagnosis not present

## 2023-02-26 DIAGNOSIS — M5432 Sciatica, left side: Secondary | ICD-10-CM | POA: Diagnosis not present

## 2023-02-26 DIAGNOSIS — M542 Cervicalgia: Secondary | ICD-10-CM | POA: Diagnosis not present

## 2023-02-26 DIAGNOSIS — M5431 Sciatica, right side: Secondary | ICD-10-CM | POA: Diagnosis not present

## 2023-02-26 DIAGNOSIS — R262 Difficulty in walking, not elsewhere classified: Secondary | ICD-10-CM | POA: Diagnosis not present

## 2023-02-26 DIAGNOSIS — M5451 Vertebrogenic low back pain: Secondary | ICD-10-CM | POA: Diagnosis not present

## 2023-03-01 DIAGNOSIS — R262 Difficulty in walking, not elsewhere classified: Secondary | ICD-10-CM | POA: Diagnosis not present

## 2023-03-01 DIAGNOSIS — M5431 Sciatica, right side: Secondary | ICD-10-CM | POA: Diagnosis not present

## 2023-03-01 DIAGNOSIS — M5451 Vertebrogenic low back pain: Secondary | ICD-10-CM | POA: Diagnosis not present

## 2023-03-01 DIAGNOSIS — M5432 Sciatica, left side: Secondary | ICD-10-CM | POA: Diagnosis not present

## 2023-03-01 DIAGNOSIS — M542 Cervicalgia: Secondary | ICD-10-CM | POA: Diagnosis not present

## 2023-03-01 DIAGNOSIS — M530 Cervicocranial syndrome: Secondary | ICD-10-CM | POA: Diagnosis not present

## 2023-03-07 DIAGNOSIS — Z5181 Encounter for therapeutic drug level monitoring: Secondary | ICD-10-CM | POA: Diagnosis not present

## 2023-03-07 DIAGNOSIS — M4311 Spondylolisthesis, occipito-atlanto-axial region: Secondary | ICD-10-CM | POA: Diagnosis not present

## 2023-03-07 DIAGNOSIS — M542 Cervicalgia: Secondary | ICD-10-CM | POA: Diagnosis not present

## 2023-03-07 DIAGNOSIS — M4312 Spondylolisthesis, cervical region: Secondary | ICD-10-CM | POA: Diagnosis not present

## 2023-03-07 DIAGNOSIS — G8929 Other chronic pain: Secondary | ICD-10-CM | POA: Diagnosis not present

## 2023-03-12 ENCOUNTER — Ambulatory Visit: Payer: Medicare PPO | Admitting: Family Medicine

## 2023-03-12 ENCOUNTER — Encounter: Payer: Self-pay | Admitting: Family Medicine

## 2023-03-12 VITALS — BP 149/83 | HR 87 | Temp 97.7°F | Resp 16 | Ht 64.0 in | Wt 192.7 lb

## 2023-03-12 DIAGNOSIS — I10 Essential (primary) hypertension: Secondary | ICD-10-CM

## 2023-03-12 NOTE — Patient Instructions (Signed)
We may want to consider adding a longerlasting daily BP medication like losartan or valsartan if most of your BP readings are staying high like this.  Your cardiologist may have other recommendations/changes when you go to see them for your follow up

## 2023-03-12 NOTE — Progress Notes (Unsigned)
Patient ID: DEZEREA QUI, female    DOB: 21-Jul-1951, 72 y.o.   MRN: [id number]  PCP: Danelle Berry, PA-C  Chief Complaint  Patient presents with   Follow-up   Hypertension    Still has some high reading at home    Subjective:   Brittany Hill is a 72 y.o. female, presents to clinic with CC of the following:  HPI  Here for increased BP/hypertension f/up She got an ECHO done by her cardiologist at [facility] and has f/up soon as well Her home BP log has more than 30 entries and majority are >140-170's only 6 times was her BP in goal range or less than 130 She is taking hydralazine 10 mg most days, not taking BID  Patient Active Problem List   Diagnosis Date Noted   History of total knee replacement, bilateral 05/19/2022   At moderate risk for fall 05/19/2022   Syncope 01/17/2022   Dyslipidemia associated with type 2 diabetes mellitus (HCC) 07/12/2021   Acquired stenosis of bilateral nasolacrimal duct 02/08/2021   Type 2 diabetes mellitus without complication, without long-term current use of insulin (HCC) 03/22/2020   Acquired trigger finger 01/06/2020   Bouchard's nodes (with arthropathy) 01/06/2020   Chronic post-operative pain 09/05/2019   Neck pain 09/05/2019   Cervical radiculopathy 09/05/2019   Displacement of cervical intervertebral disc 05/14/2019   Fatty liver disease, nonalcoholic 03/28/2019   Osteopenia 10/20/2018   Obesity (BMI 30.0-34.9) 09/27/2018   Cataract, left 04/02/2018   Osteoarthritis of left ankle 02/16/2018   Asthma without status asthmaticus 10/05/2017   Genetic testing 08/28/2017   Family hx-breast malignancy 08/02/2017   Dense breast tissue on mammogram 07/28/2016   Left sided sciatica 12/19/2015   Left lumbar radiculopathy 12/16/2015   Hypomagnesemia 12/06/2015   Hypokalemia 12/02/2015   Fibromyalgia    MS (multiple sclerosis) (HCC)    Chronic constipation    OA (osteoarthritis)    Hypothyroidism    IFG (impaired fasting glucose)     Hypertension    Hyperlipidemia    GERD (gastroesophageal reflux disease)    MDD (major depressive disorder), recurrent, in full remission (HCC)    Anxiety    TMJ syndrome       Current Outpatient Medications:    acetaminophen (TYLENOL) 500 MG tablet, Take 1,000 mg by mouth every 6 (six) hours as needed., Disp: , Rfl:    cholecalciferol (VITAMIN D3) 25 MCG (1000 UNIT) tablet, Take 1,000 Units by mouth daily., Disp: , Rfl:    DULoxetine (CYMBALTA) 20 MG capsule, Take 2 CAPSULES IN THE MORNING AND ONE IN THE EVENING., Disp: 270 capsule, Rfl: 2   EPINEPHrine 0.3 mg/0.3 mL IJ SOAJ injection, Inject 0.3 mg into the muscle once. , Disp: , Rfl:    ezetimibe-simvastatin (VYTORIN) 10-40 MG tablet, Take 1 tablet by mouth at bedtime., Disp: 90 tablet, Rfl: 2   famotidine (PEPCID) 20 MG tablet, Take 1 tablet (20 mg total) by mouth 2 (two) times daily as needed for heartburn or indigestion., Disp: 60 tablet, Rfl: 5   hydrALAZINE (APRESOLINE) 10 MG tablet, Take 1 tablet (10 mg total) by mouth 2 (two) times daily as needed (take for sustained BP >160/100)., Disp: 30 tablet, Rfl: 1   Insulin Pen Needle 32G X 4 MM MISC, Use as directed with forteo injection daily, Disp: 90 each, Rfl: 2   ipratropium (ATROVENT) 0.03 % nasal spray, Place 2 sprays into both nostrils every 12 (twelve) hours., Disp: 30 mL, Rfl: 12  levothyroxine (SYNTHROID) 75 MCG tablet, Take 1 tablet (75 mcg total) by mouth daily before breakfast., Disp: 90 tablet, Rfl: 2   meloxicam (MOBIC) 15 MG tablet, Take 1 tablet (15 mg total) by mouth daily., Disp: 90 tablet, Rfl: 0   metFORMIN (GLUCOPHAGE-XR) 500 MG 24 hr tablet, Take 1 tablet (500 mg total) by mouth 2 (two) times daily., Disp: 180 tablet, Rfl: 3   metoprolol succinate (TOPROL-XL) 50 MG 24 hr tablet, Take 1 tablet (50 mg total) by mouth once daily, Disp: 90 tablet, Rfl: 1   montelukast (SINGULAIR) 10 MG tablet, Take 1 tablet (10 mg total) by mouth at bedtime., Disp: 90 tablet, Rfl:  1   naloxone (NARCAN) nasal spray 4 mg/0.1 mL, Place into the nose., Disp: , Rfl:    ondansetron (ZOFRAN ODT) 4 MG disintegrating tablet, Take 1 tablet (4 mg total) by mouth every 8 (eight) hours as needed for nausea or vomiting., Disp: 20 tablet, Rfl: 1   oxyCODONE (OXY IR/ROXICODONE) 5 MG immediate release tablet, Take 5 mg by mouth 2 (two) times daily as needed., Disp: , Rfl:    oxycodone (OXY-IR) 5 MG capsule, Take 5 mg by mouth every 6 (six) hours as needed., Disp: , Rfl:    pantoprazole (PROTONIX) 40 MG tablet, Take 1 tablet (40 mg total) by mouth at bedtime., Disp: 90 tablet, Rfl: 0   pregabalin (LYRICA) 100 MG capsule, Take 1 capsule (100 mg total) by mouth in the morning AND 2 capsules (200 mg total) at bedtime., Disp: 270 capsule, Rfl: 1   spironolactone (ALDACTONE) 25 MG tablet, Take 1 tablet (25 mg total) by mouth daily. With breakfast, Disp: 90 tablet, Rfl: 0   Teriparatide, Recombinant, (FORTEO) 600 MCG/2.4ML SOPN, Inject 20 mcg subQ once daily, Disp: 2.4 mL, Rfl: 5   tiZANidine (ZANAFLEX) 4 MG tablet, Take 2 tablets (8 mg total) by mouth 3 (three) times daily., Disp: 540 tablet, Rfl: 1   XTAMPZA ER 9 MG C12A, Take 1 capsule by mouth daily., Disp: , Rfl:    albuterol (VENTOLIN HFA) 108 (90 Base) MCG/ACT inhaler, Inhale 2 puffs into the lungs every 6 (six) hours as needed for wheezing or shortness of breath., Disp: 18 g, Rfl: 0   calcium carbonate (OS-CAL) 600 MG TABS tablet, Take 600 mg by mouth as needed.  (Patient not taking: Reported on 09/19/2022), Disp: , Rfl:    fluticasone-salmeterol (ADVAIR) 100-50 MCG/ACT AEPB, Inhale 1 puff into the lungs 2 (two) times daily. (Patient not taking: Reported on 09/19/2022), Disp: 1 each, Rfl: 1   Allergies  Allergen Reactions   Shellfish Allergy Rash and Swelling   Codeine Nausea And Vomiting   Sulfa Antibiotics Swelling     Social History   Tobacco Use   Smoking status: Never   Smokeless tobacco: Never  Vaping Use   Vaping status:  Never Used  Substance Use Topics   Alcohol use: No   Drug use: No      Chart Review Today: I personally reviewed active problem list, medication list, allergies, family history, social history, health maintenance, notes from last encounter, lab results, imaging with the patient/caregiver today.   Review of Systems  Constitutional: Negative.   HENT: Negative.    Eyes: Negative.   Respiratory: Negative.    Cardiovascular: Negative.   Gastrointestinal: Negative.   Endocrine: Negative.   Genitourinary: Negative.   Musculoskeletal: Negative.   Skin: Negative.   Allergic/Immunologic: Negative.   Neurological: Negative.   Hematological: Negative.   Psychiatric/Behavioral: Negative.  All other systems reviewed and are negative.      Objective:   Vitals:   03/12/23 1125 03/12/23 1133  BP: (!) 142/82 (!) 149/83  Pulse: 87   Resp: 16   Temp: 97.7 F (36.5 C)   TempSrc: Oral   SpO2: 94%   Weight: 192 lb 11.2 oz (87.4 kg)   Height: 5\' 4"  (1.626 m)     Body mass index is 33.08 kg/m.  Physical Exam Vitals and nursing note reviewed.  Constitutional:      General: She is not in acute distress.    Appearance: Normal appearance. She is well-developed. She is not ill-appearing, toxic-appearing or diaphoretic.  HENT:     Head: Normocephalic and atraumatic.     Nose: Nose normal.  Eyes:     General:        Right eye: No discharge.        Left eye: No discharge.     Conjunctiva/sclera: Conjunctivae normal.  Neck:     Trachea: No tracheal deviation.  Cardiovascular:     Rate and Rhythm: Normal rate and regular rhythm.     Pulses: Normal pulses.     Heart sounds: Normal heart sounds. No murmur heard.    No friction rub. No gallop.  Pulmonary:     Effort: Pulmonary effort is normal. No respiratory distress.     Breath sounds: Normal breath sounds. No stridor. No wheezing, rhonchi or rales.  Musculoskeletal:     Cervical back: Rigidity present. Decreased range of motion.   Skin:    General: Skin is warm and dry.     Findings: No rash.  Neurological:     Mental Status: She is alert.     Motor: No abnormal muscle tone.     Coordination: Coordination normal.  Psychiatric:        Behavior: Behavior normal.      Results for orders placed or performed in visit on 02/19/23  Microalbumin / creatinine urine ratio  Result Value Ref Range   Creatinine, Urine 80 20 - 275 mg/dL   Microalb, Ur <1.6 mg/dL   Microalb Creat Ratio NOTE <30 mg/g creat  COMPLETE METABOLIC PANEL WITH GFR  Result Value Ref Range   Glucose, Bld 84 65 - 99 mg/dL   BUN 11 7 - 25 mg/dL   Creat 1.09 6.04 - 5.40 mg/dL   eGFR 72 > OR = 60 JW/JXB/1.47W2   BUN/Creatinine Ratio SEE NOTE: 6 - 22 (calc)   Sodium 134 (L) 135 - 146 mmol/L   Potassium 4.8 3.5 - 5.3 mmol/L   Chloride 98 98 - 110 mmol/L   CO2 29 20 - 32 mmol/L   Calcium 10.2 8.6 - 10.4 mg/dL   Total Protein 7.3 6.1 - 8.1 g/dL   Albumin 4.8 3.6 - 5.1 g/dL   Globulin 2.5 1.9 - 3.7 g/dL (calc)   AG Ratio 1.9 1.0 - 2.5 (calc)   Total Bilirubin 0.3 0.2 - 1.2 mg/dL   Alkaline phosphatase (APISO) 40 37 - 153 U/L   AST 18 10 - 35 U/L   ALT 13 6 - 29 U/L  Hemoglobin A1c  Result Value Ref Range   Hgb A1c MFr Bld 6.3 (H) <5.7 % of total Hgb   Mean Plasma Glucose 134 mg/dL   eAG (mmol/L) 7.4 mmol/L  TSH  Result Value Ref Range   TSH 3.52 0.40 - 4.50 mIU/L  CBC with Differential/Platelet  Result Value Ref Range   WBC 5.7 3.8 -  10.8 Thousand/uL   RBC 3.89 3.80 - 5.10 Million/uL   Hemoglobin 11.6 (L) 11.7 - 15.5 g/dL   HCT 16.1 09.6 - 04.5 %   MCV 90.5 80.0 - 100.0 fL   MCH 29.8 27.0 - 33.0 pg   MCHC 33.0 32.0 - 36.0 g/dL   RDW 40.9 81.1 - 91.4 %   Platelets 208 140 - 400 Thousand/uL   MPV 10.4 7.5 - 12.5 fL   Neutro Abs 2,993 1,500 - 7,800 cells/uL   Lymphs Abs 1,892 850 - 3,900 cells/uL   Absolute Monocytes 559 200 - 950 cells/uL   Eosinophils Absolute 228 15 - 500 cells/uL   Basophils Absolute 29 0 - 200 cells/uL    Neutrophils Relative % 52.5 %   Total Lymphocyte 33.2 %   Monocytes Relative 9.8 %   Eosinophils Relative 4.0 %   Basophils Relative 0.5 %       Assessment & Plan:   1. Primary hypertension BP here today and per home BP log elevated and not at goal. BP Readings from Last 3 Encounters:  03/12/23 (!) 149/83  02/19/23 (!) 164/82  12/05/22 106/62   Majority of readings are 140-170 (out of 30+ readings only 6 were at or near goal) She is pending f/up with her new cardiologist to review ECHO If she is needing better HTN control I would recommend possibly adding a new daily medication rather than hydralazine BID to TID Consider ARB - will wait for cardiology f/up and results to see if cardiologist makes suggestions Pt will contact us via mychart or phone call after her appt For now she can continue hydralazine - possibly try 10 mg BID for better prevention of higher readings to limit her risk.  I did explain how hydralazine works better BID to TID if she is taking it this often     Danelle Berry, PA-C 03/12/23 11:47 AM

## 2023-03-14 DIAGNOSIS — R262 Difficulty in walking, not elsewhere classified: Secondary | ICD-10-CM | POA: Diagnosis not present

## 2023-03-14 DIAGNOSIS — M542 Cervicalgia: Secondary | ICD-10-CM | POA: Diagnosis not present

## 2023-03-14 DIAGNOSIS — M530 Cervicocranial syndrome: Secondary | ICD-10-CM | POA: Diagnosis not present

## 2023-03-14 DIAGNOSIS — M5431 Sciatica, right side: Secondary | ICD-10-CM | POA: Diagnosis not present

## 2023-03-14 DIAGNOSIS — M5451 Vertebrogenic low back pain: Secondary | ICD-10-CM | POA: Diagnosis not present

## 2023-03-14 DIAGNOSIS — M5432 Sciatica, left side: Secondary | ICD-10-CM | POA: Diagnosis not present

## 2023-03-14 NOTE — Assessment & Plan Note (Signed)
Has been well controlled on statin-zetia combo pill

## 2023-03-14 NOTE — Assessment & Plan Note (Signed)
On lyrica and cymbalta

## 2023-03-14 NOTE — Assessment & Plan Note (Signed)
    03/12/2023   11:24 AM 02/19/2023   10:49 AM 12/05/2022    2:52 PM  Depression screen PHQ 2/9  Decreased Interest 1 2 0  Down, Depressed, Hopeless 1 2 0  PHQ - 2 Score 2 4 0  Altered sleeping 1 1 0  Tired, decreased energy 1 1 0  Change in appetite 0 0 0  Feeling bad or failure about yourself  0 0 0  Trouble concentrating 0 0 0  Moving slowly or fidgety/restless 0 0 0  Suicidal thoughts 0 0 0  PHQ-9 Score 4 6 0  Difficult doing work/chores Somewhat difficult Somewhat difficult Not difficult at all  Reviewed, on cymbalta

## 2023-03-14 NOTE — Assessment & Plan Note (Signed)
Sx stable, well controlled

## 2023-03-14 NOTE — Assessment & Plan Note (Signed)
Has been well controlled on metformin, due for labs

## 2023-03-14 NOTE — Assessment & Plan Note (Signed)
Recheck TSH On 75 mcg dose daily

## 2023-03-14 NOTE — Assessment & Plan Note (Signed)
BP elevated, likely due to pain She can continue using hydralazine for BP >160 Recommend she come in with home BP readings and log for further review

## 2023-03-14 NOTE — Assessment & Plan Note (Signed)
>>  ASSESSMENT AND PLAN FOR PREDIABETES WRITTEN ON 03/14/2023  6:44 PM BY TAPIA, LEISA, PA-C  Has been well controlled on metformin , due for labs

## 2023-03-15 ENCOUNTER — Encounter: Payer: Self-pay | Admitting: Family Medicine

## 2023-03-16 DIAGNOSIS — M5431 Sciatica, right side: Secondary | ICD-10-CM | POA: Diagnosis not present

## 2023-03-16 DIAGNOSIS — M530 Cervicocranial syndrome: Secondary | ICD-10-CM | POA: Diagnosis not present

## 2023-03-16 DIAGNOSIS — M5451 Vertebrogenic low back pain: Secondary | ICD-10-CM | POA: Diagnosis not present

## 2023-03-16 DIAGNOSIS — M542 Cervicalgia: Secondary | ICD-10-CM | POA: Diagnosis not present

## 2023-03-16 DIAGNOSIS — M5432 Sciatica, left side: Secondary | ICD-10-CM | POA: Diagnosis not present

## 2023-03-16 DIAGNOSIS — R262 Difficulty in walking, not elsewhere classified: Secondary | ICD-10-CM | POA: Diagnosis not present

## 2023-03-19 DIAGNOSIS — R079 Chest pain, unspecified: Secondary | ICD-10-CM | POA: Diagnosis not present

## 2023-03-19 DIAGNOSIS — I10 Essential (primary) hypertension: Secondary | ICD-10-CM | POA: Diagnosis not present

## 2023-03-19 DIAGNOSIS — E782 Mixed hyperlipidemia: Secondary | ICD-10-CM | POA: Diagnosis not present

## 2023-03-19 DIAGNOSIS — I38 Endocarditis, valve unspecified: Secondary | ICD-10-CM | POA: Diagnosis not present

## 2023-03-20 DIAGNOSIS — M5432 Sciatica, left side: Secondary | ICD-10-CM | POA: Diagnosis not present

## 2023-03-20 DIAGNOSIS — M5431 Sciatica, right side: Secondary | ICD-10-CM | POA: Diagnosis not present

## 2023-03-20 DIAGNOSIS — M542 Cervicalgia: Secondary | ICD-10-CM | POA: Diagnosis not present

## 2023-03-20 DIAGNOSIS — M5451 Vertebrogenic low back pain: Secondary | ICD-10-CM | POA: Diagnosis not present

## 2023-03-20 DIAGNOSIS — M530 Cervicocranial syndrome: Secondary | ICD-10-CM | POA: Diagnosis not present

## 2023-03-20 DIAGNOSIS — R262 Difficulty in walking, not elsewhere classified: Secondary | ICD-10-CM | POA: Diagnosis not present

## 2023-03-21 ENCOUNTER — Other Ambulatory Visit: Payer: Self-pay | Admitting: Family Medicine

## 2023-03-21 DIAGNOSIS — M542 Cervicalgia: Secondary | ICD-10-CM

## 2023-03-27 DIAGNOSIS — G35 Multiple sclerosis: Secondary | ICD-10-CM | POA: Diagnosis not present

## 2023-03-27 DIAGNOSIS — M542 Cervicalgia: Secondary | ICD-10-CM | POA: Diagnosis not present

## 2023-03-27 DIAGNOSIS — M530 Cervicocranial syndrome: Secondary | ICD-10-CM | POA: Diagnosis not present

## 2023-03-27 DIAGNOSIS — M25551 Pain in right hip: Secondary | ICD-10-CM | POA: Diagnosis not present

## 2023-03-27 DIAGNOSIS — M5451 Vertebrogenic low back pain: Secondary | ICD-10-CM | POA: Diagnosis not present

## 2023-03-27 DIAGNOSIS — R262 Difficulty in walking, not elsewhere classified: Secondary | ICD-10-CM | POA: Diagnosis not present

## 2023-03-27 DIAGNOSIS — M5432 Sciatica, left side: Secondary | ICD-10-CM | POA: Diagnosis not present

## 2023-03-27 DIAGNOSIS — M5416 Radiculopathy, lumbar region: Secondary | ICD-10-CM | POA: Diagnosis not present

## 2023-03-27 DIAGNOSIS — M5431 Sciatica, right side: Secondary | ICD-10-CM | POA: Diagnosis not present

## 2023-03-27 DIAGNOSIS — M7061 Trochanteric bursitis, right hip: Secondary | ICD-10-CM | POA: Diagnosis not present

## 2023-03-27 DIAGNOSIS — G8929 Other chronic pain: Secondary | ICD-10-CM | POA: Diagnosis not present

## 2023-03-27 DIAGNOSIS — G8928 Other chronic postprocedural pain: Secondary | ICD-10-CM | POA: Diagnosis not present

## 2023-03-27 DIAGNOSIS — M5412 Radiculopathy, cervical region: Secondary | ICD-10-CM | POA: Diagnosis not present

## 2023-03-27 DIAGNOSIS — Z5181 Encounter for therapeutic drug level monitoring: Secondary | ICD-10-CM | POA: Diagnosis not present

## 2023-03-27 DIAGNOSIS — Z981 Arthrodesis status: Secondary | ICD-10-CM | POA: Diagnosis not present

## 2023-04-03 DIAGNOSIS — E782 Mixed hyperlipidemia: Secondary | ICD-10-CM | POA: Diagnosis not present

## 2023-04-03 DIAGNOSIS — I10 Essential (primary) hypertension: Secondary | ICD-10-CM | POA: Diagnosis not present

## 2023-04-03 DIAGNOSIS — I38 Endocarditis, valve unspecified: Secondary | ICD-10-CM | POA: Diagnosis not present

## 2023-04-03 DIAGNOSIS — R079 Chest pain, unspecified: Secondary | ICD-10-CM | POA: Diagnosis not present

## 2023-04-10 DIAGNOSIS — M530 Cervicocranial syndrome: Secondary | ICD-10-CM | POA: Diagnosis not present

## 2023-04-10 DIAGNOSIS — R262 Difficulty in walking, not elsewhere classified: Secondary | ICD-10-CM | POA: Diagnosis not present

## 2023-04-10 DIAGNOSIS — M5432 Sciatica, left side: Secondary | ICD-10-CM | POA: Diagnosis not present

## 2023-04-10 DIAGNOSIS — M542 Cervicalgia: Secondary | ICD-10-CM | POA: Diagnosis not present

## 2023-04-10 DIAGNOSIS — M5451 Vertebrogenic low back pain: Secondary | ICD-10-CM | POA: Diagnosis not present

## 2023-04-10 DIAGNOSIS — M5431 Sciatica, right side: Secondary | ICD-10-CM | POA: Diagnosis not present

## 2023-04-11 ENCOUNTER — Other Ambulatory Visit: Payer: Self-pay | Admitting: Family Medicine

## 2023-04-13 DIAGNOSIS — M530 Cervicocranial syndrome: Secondary | ICD-10-CM | POA: Diagnosis not present

## 2023-04-13 DIAGNOSIS — M5431 Sciatica, right side: Secondary | ICD-10-CM | POA: Diagnosis not present

## 2023-04-13 DIAGNOSIS — M542 Cervicalgia: Secondary | ICD-10-CM | POA: Diagnosis not present

## 2023-04-13 DIAGNOSIS — M5432 Sciatica, left side: Secondary | ICD-10-CM | POA: Diagnosis not present

## 2023-04-13 DIAGNOSIS — M5451 Vertebrogenic low back pain: Secondary | ICD-10-CM | POA: Diagnosis not present

## 2023-04-13 DIAGNOSIS — R262 Difficulty in walking, not elsewhere classified: Secondary | ICD-10-CM | POA: Diagnosis not present

## 2023-04-17 DIAGNOSIS — M5451 Vertebrogenic low back pain: Secondary | ICD-10-CM | POA: Diagnosis not present

## 2023-04-17 DIAGNOSIS — M530 Cervicocranial syndrome: Secondary | ICD-10-CM | POA: Diagnosis not present

## 2023-04-17 DIAGNOSIS — M5431 Sciatica, right side: Secondary | ICD-10-CM | POA: Diagnosis not present

## 2023-04-17 DIAGNOSIS — M5432 Sciatica, left side: Secondary | ICD-10-CM | POA: Diagnosis not present

## 2023-04-17 DIAGNOSIS — R262 Difficulty in walking, not elsewhere classified: Secondary | ICD-10-CM | POA: Diagnosis not present

## 2023-04-17 DIAGNOSIS — M542 Cervicalgia: Secondary | ICD-10-CM | POA: Diagnosis not present

## 2023-04-21 DIAGNOSIS — M542 Cervicalgia: Secondary | ICD-10-CM | POA: Diagnosis not present

## 2023-04-21 DIAGNOSIS — Z981 Arthrodesis status: Secondary | ICD-10-CM | POA: Diagnosis not present

## 2023-04-21 DIAGNOSIS — R52 Pain, unspecified: Secondary | ICD-10-CM | POA: Diagnosis not present

## 2023-04-21 DIAGNOSIS — M792 Neuralgia and neuritis, unspecified: Secondary | ICD-10-CM | POA: Diagnosis not present

## 2023-04-21 DIAGNOSIS — M545 Low back pain, unspecified: Secondary | ICD-10-CM | POA: Diagnosis not present

## 2023-04-24 DIAGNOSIS — M5431 Sciatica, right side: Secondary | ICD-10-CM | POA: Diagnosis not present

## 2023-04-24 DIAGNOSIS — M5432 Sciatica, left side: Secondary | ICD-10-CM | POA: Diagnosis not present

## 2023-04-24 DIAGNOSIS — M542 Cervicalgia: Secondary | ICD-10-CM | POA: Diagnosis not present

## 2023-04-24 DIAGNOSIS — M5451 Vertebrogenic low back pain: Secondary | ICD-10-CM | POA: Diagnosis not present

## 2023-04-24 DIAGNOSIS — M530 Cervicocranial syndrome: Secondary | ICD-10-CM | POA: Diagnosis not present

## 2023-04-24 DIAGNOSIS — R262 Difficulty in walking, not elsewhere classified: Secondary | ICD-10-CM | POA: Diagnosis not present

## 2023-04-26 ENCOUNTER — Other Ambulatory Visit: Payer: Self-pay | Admitting: Family Medicine

## 2023-04-26 DIAGNOSIS — F3342 Major depressive disorder, recurrent, in full remission: Secondary | ICD-10-CM

## 2023-04-26 DIAGNOSIS — G8929 Other chronic pain: Secondary | ICD-10-CM

## 2023-04-26 DIAGNOSIS — M797 Fibromyalgia: Secondary | ICD-10-CM

## 2023-04-26 DIAGNOSIS — G629 Polyneuropathy, unspecified: Secondary | ICD-10-CM

## 2023-04-27 DIAGNOSIS — M530 Cervicocranial syndrome: Secondary | ICD-10-CM | POA: Diagnosis not present

## 2023-04-27 DIAGNOSIS — M5432 Sciatica, left side: Secondary | ICD-10-CM | POA: Diagnosis not present

## 2023-04-27 DIAGNOSIS — M5431 Sciatica, right side: Secondary | ICD-10-CM | POA: Diagnosis not present

## 2023-04-27 DIAGNOSIS — R262 Difficulty in walking, not elsewhere classified: Secondary | ICD-10-CM | POA: Diagnosis not present

## 2023-04-27 DIAGNOSIS — M5451 Vertebrogenic low back pain: Secondary | ICD-10-CM | POA: Diagnosis not present

## 2023-04-27 DIAGNOSIS — M542 Cervicalgia: Secondary | ICD-10-CM | POA: Diagnosis not present

## 2023-04-30 DIAGNOSIS — M5417 Radiculopathy, lumbosacral region: Secondary | ICD-10-CM | POA: Diagnosis not present

## 2023-04-30 DIAGNOSIS — M961 Postlaminectomy syndrome, not elsewhere classified: Secondary | ICD-10-CM | POA: Diagnosis not present

## 2023-05-01 DIAGNOSIS — M5432 Sciatica, left side: Secondary | ICD-10-CM | POA: Diagnosis not present

## 2023-05-01 DIAGNOSIS — M530 Cervicocranial syndrome: Secondary | ICD-10-CM | POA: Diagnosis not present

## 2023-05-01 DIAGNOSIS — M5451 Vertebrogenic low back pain: Secondary | ICD-10-CM | POA: Diagnosis not present

## 2023-05-01 DIAGNOSIS — M542 Cervicalgia: Secondary | ICD-10-CM | POA: Diagnosis not present

## 2023-05-01 DIAGNOSIS — R262 Difficulty in walking, not elsewhere classified: Secondary | ICD-10-CM | POA: Diagnosis not present

## 2023-05-01 DIAGNOSIS — M5431 Sciatica, right side: Secondary | ICD-10-CM | POA: Diagnosis not present

## 2023-05-04 DIAGNOSIS — M5451 Vertebrogenic low back pain: Secondary | ICD-10-CM | POA: Diagnosis not present

## 2023-05-04 DIAGNOSIS — M530 Cervicocranial syndrome: Secondary | ICD-10-CM | POA: Diagnosis not present

## 2023-05-04 DIAGNOSIS — M542 Cervicalgia: Secondary | ICD-10-CM | POA: Diagnosis not present

## 2023-05-04 DIAGNOSIS — M5432 Sciatica, left side: Secondary | ICD-10-CM | POA: Diagnosis not present

## 2023-05-04 DIAGNOSIS — M5431 Sciatica, right side: Secondary | ICD-10-CM | POA: Diagnosis not present

## 2023-05-04 DIAGNOSIS — R262 Difficulty in walking, not elsewhere classified: Secondary | ICD-10-CM | POA: Diagnosis not present

## 2023-05-07 DIAGNOSIS — M530 Cervicocranial syndrome: Secondary | ICD-10-CM | POA: Diagnosis not present

## 2023-05-07 DIAGNOSIS — M5432 Sciatica, left side: Secondary | ICD-10-CM | POA: Diagnosis not present

## 2023-05-07 DIAGNOSIS — M5431 Sciatica, right side: Secondary | ICD-10-CM | POA: Diagnosis not present

## 2023-05-07 DIAGNOSIS — M5451 Vertebrogenic low back pain: Secondary | ICD-10-CM | POA: Diagnosis not present

## 2023-05-07 DIAGNOSIS — M542 Cervicalgia: Secondary | ICD-10-CM | POA: Diagnosis not present

## 2023-05-07 DIAGNOSIS — R262 Difficulty in walking, not elsewhere classified: Secondary | ICD-10-CM | POA: Diagnosis not present

## 2023-05-09 DIAGNOSIS — R262 Difficulty in walking, not elsewhere classified: Secondary | ICD-10-CM | POA: Diagnosis not present

## 2023-05-09 DIAGNOSIS — M530 Cervicocranial syndrome: Secondary | ICD-10-CM | POA: Diagnosis not present

## 2023-05-09 DIAGNOSIS — M5432 Sciatica, left side: Secondary | ICD-10-CM | POA: Diagnosis not present

## 2023-05-09 DIAGNOSIS — M5451 Vertebrogenic low back pain: Secondary | ICD-10-CM | POA: Diagnosis not present

## 2023-05-09 DIAGNOSIS — M542 Cervicalgia: Secondary | ICD-10-CM | POA: Diagnosis not present

## 2023-05-09 DIAGNOSIS — M5431 Sciatica, right side: Secondary | ICD-10-CM | POA: Diagnosis not present

## 2023-05-11 DIAGNOSIS — M5412 Radiculopathy, cervical region: Secondary | ICD-10-CM | POA: Diagnosis not present

## 2023-05-11 DIAGNOSIS — M5417 Radiculopathy, lumbosacral region: Secondary | ICD-10-CM | POA: Diagnosis not present

## 2023-05-14 DIAGNOSIS — M5431 Sciatica, right side: Secondary | ICD-10-CM | POA: Diagnosis not present

## 2023-05-14 DIAGNOSIS — R262 Difficulty in walking, not elsewhere classified: Secondary | ICD-10-CM | POA: Diagnosis not present

## 2023-05-14 DIAGNOSIS — M5432 Sciatica, left side: Secondary | ICD-10-CM | POA: Diagnosis not present

## 2023-05-14 DIAGNOSIS — M530 Cervicocranial syndrome: Secondary | ICD-10-CM | POA: Diagnosis not present

## 2023-05-14 DIAGNOSIS — M5451 Vertebrogenic low back pain: Secondary | ICD-10-CM | POA: Diagnosis not present

## 2023-05-14 DIAGNOSIS — M542 Cervicalgia: Secondary | ICD-10-CM | POA: Diagnosis not present

## 2023-05-15 ENCOUNTER — Other Ambulatory Visit: Payer: Self-pay | Admitting: Family Medicine

## 2023-05-15 DIAGNOSIS — K219 Gastro-esophageal reflux disease without esophagitis: Secondary | ICD-10-CM

## 2023-05-15 DIAGNOSIS — M5481 Occipital neuralgia: Secondary | ICD-10-CM | POA: Diagnosis not present

## 2023-05-15 DIAGNOSIS — M542 Cervicalgia: Secondary | ICD-10-CM | POA: Diagnosis not present

## 2023-05-15 DIAGNOSIS — Z981 Arthrodesis status: Secondary | ICD-10-CM | POA: Diagnosis not present

## 2023-05-16 DIAGNOSIS — M5416 Radiculopathy, lumbar region: Secondary | ICD-10-CM | POA: Diagnosis not present

## 2023-05-16 DIAGNOSIS — M48061 Spinal stenosis, lumbar region without neurogenic claudication: Secondary | ICD-10-CM | POA: Diagnosis not present

## 2023-05-26 ENCOUNTER — Other Ambulatory Visit: Payer: Self-pay | Admitting: Family Medicine

## 2023-05-26 DIAGNOSIS — M810 Age-related osteoporosis without current pathological fracture: Secondary | ICD-10-CM

## 2023-05-26 DIAGNOSIS — M26629 Arthralgia of temporomandibular joint, unspecified side: Secondary | ICD-10-CM

## 2023-05-26 DIAGNOSIS — R1310 Dysphagia, unspecified: Secondary | ICD-10-CM

## 2023-05-26 DIAGNOSIS — Z9889 Other specified postprocedural states: Secondary | ICD-10-CM

## 2023-05-28 DIAGNOSIS — R262 Difficulty in walking, not elsewhere classified: Secondary | ICD-10-CM | POA: Diagnosis not present

## 2023-05-28 DIAGNOSIS — M5431 Sciatica, right side: Secondary | ICD-10-CM | POA: Diagnosis not present

## 2023-05-28 DIAGNOSIS — M5451 Vertebrogenic low back pain: Secondary | ICD-10-CM | POA: Diagnosis not present

## 2023-05-28 DIAGNOSIS — M5432 Sciatica, left side: Secondary | ICD-10-CM | POA: Diagnosis not present

## 2023-05-28 DIAGNOSIS — M542 Cervicalgia: Secondary | ICD-10-CM | POA: Diagnosis not present

## 2023-05-28 DIAGNOSIS — M530 Cervicocranial syndrome: Secondary | ICD-10-CM | POA: Diagnosis not present

## 2023-05-30 DIAGNOSIS — M5431 Sciatica, right side: Secondary | ICD-10-CM | POA: Diagnosis not present

## 2023-05-30 DIAGNOSIS — M530 Cervicocranial syndrome: Secondary | ICD-10-CM | POA: Diagnosis not present

## 2023-05-30 DIAGNOSIS — M5432 Sciatica, left side: Secondary | ICD-10-CM | POA: Diagnosis not present

## 2023-05-30 DIAGNOSIS — R262 Difficulty in walking, not elsewhere classified: Secondary | ICD-10-CM | POA: Diagnosis not present

## 2023-05-30 DIAGNOSIS — M5451 Vertebrogenic low back pain: Secondary | ICD-10-CM | POA: Diagnosis not present

## 2023-05-30 DIAGNOSIS — M542 Cervicalgia: Secondary | ICD-10-CM | POA: Diagnosis not present

## 2023-05-31 DIAGNOSIS — Z961 Presence of intraocular lens: Secondary | ICD-10-CM | POA: Diagnosis not present

## 2023-05-31 DIAGNOSIS — H26493 Other secondary cataract, bilateral: Secondary | ICD-10-CM | POA: Diagnosis not present

## 2023-05-31 DIAGNOSIS — E119 Type 2 diabetes mellitus without complications: Secondary | ICD-10-CM | POA: Diagnosis not present

## 2023-05-31 LAB — HM DIABETES EYE EXAM

## 2023-06-04 ENCOUNTER — Encounter: Payer: Self-pay | Admitting: Family Medicine

## 2023-06-04 DIAGNOSIS — F4520 Hypochondriacal disorder, unspecified: Secondary | ICD-10-CM | POA: Diagnosis not present

## 2023-06-06 DIAGNOSIS — M5432 Sciatica, left side: Secondary | ICD-10-CM | POA: Diagnosis not present

## 2023-06-06 DIAGNOSIS — R262 Difficulty in walking, not elsewhere classified: Secondary | ICD-10-CM | POA: Diagnosis not present

## 2023-06-06 DIAGNOSIS — M5431 Sciatica, right side: Secondary | ICD-10-CM | POA: Diagnosis not present

## 2023-06-06 DIAGNOSIS — M5451 Vertebrogenic low back pain: Secondary | ICD-10-CM | POA: Diagnosis not present

## 2023-06-06 DIAGNOSIS — M542 Cervicalgia: Secondary | ICD-10-CM | POA: Diagnosis not present

## 2023-06-06 DIAGNOSIS — M530 Cervicocranial syndrome: Secondary | ICD-10-CM | POA: Diagnosis not present

## 2023-06-11 DIAGNOSIS — M530 Cervicocranial syndrome: Secondary | ICD-10-CM | POA: Diagnosis not present

## 2023-06-11 DIAGNOSIS — M542 Cervicalgia: Secondary | ICD-10-CM | POA: Diagnosis not present

## 2023-06-11 DIAGNOSIS — M5431 Sciatica, right side: Secondary | ICD-10-CM | POA: Diagnosis not present

## 2023-06-11 DIAGNOSIS — M5451 Vertebrogenic low back pain: Secondary | ICD-10-CM | POA: Diagnosis not present

## 2023-06-11 DIAGNOSIS — R262 Difficulty in walking, not elsewhere classified: Secondary | ICD-10-CM | POA: Diagnosis not present

## 2023-06-11 DIAGNOSIS — M5432 Sciatica, left side: Secondary | ICD-10-CM | POA: Diagnosis not present

## 2023-06-13 DIAGNOSIS — R262 Difficulty in walking, not elsewhere classified: Secondary | ICD-10-CM | POA: Diagnosis not present

## 2023-06-13 DIAGNOSIS — M542 Cervicalgia: Secondary | ICD-10-CM | POA: Diagnosis not present

## 2023-06-13 DIAGNOSIS — M5431 Sciatica, right side: Secondary | ICD-10-CM | POA: Diagnosis not present

## 2023-06-13 DIAGNOSIS — M5432 Sciatica, left side: Secondary | ICD-10-CM | POA: Diagnosis not present

## 2023-06-13 DIAGNOSIS — M5451 Vertebrogenic low back pain: Secondary | ICD-10-CM | POA: Diagnosis not present

## 2023-06-13 DIAGNOSIS — M530 Cervicocranial syndrome: Secondary | ICD-10-CM | POA: Diagnosis not present

## 2023-06-15 ENCOUNTER — Other Ambulatory Visit: Payer: Self-pay | Admitting: Family Medicine

## 2023-06-15 DIAGNOSIS — M542 Cervicalgia: Secondary | ICD-10-CM

## 2023-06-18 DIAGNOSIS — M542 Cervicalgia: Secondary | ICD-10-CM | POA: Diagnosis not present

## 2023-06-18 DIAGNOSIS — R262 Difficulty in walking, not elsewhere classified: Secondary | ICD-10-CM | POA: Diagnosis not present

## 2023-06-18 DIAGNOSIS — M5431 Sciatica, right side: Secondary | ICD-10-CM | POA: Diagnosis not present

## 2023-06-18 DIAGNOSIS — M5451 Vertebrogenic low back pain: Secondary | ICD-10-CM | POA: Diagnosis not present

## 2023-06-18 DIAGNOSIS — M5432 Sciatica, left side: Secondary | ICD-10-CM | POA: Diagnosis not present

## 2023-06-18 DIAGNOSIS — M530 Cervicocranial syndrome: Secondary | ICD-10-CM | POA: Diagnosis not present

## 2023-06-20 ENCOUNTER — Telehealth: Payer: Self-pay | Admitting: Family Medicine

## 2023-06-20 DIAGNOSIS — M5451 Vertebrogenic low back pain: Secondary | ICD-10-CM | POA: Diagnosis not present

## 2023-06-20 DIAGNOSIS — M5432 Sciatica, left side: Secondary | ICD-10-CM | POA: Diagnosis not present

## 2023-06-20 DIAGNOSIS — M542 Cervicalgia: Secondary | ICD-10-CM | POA: Diagnosis not present

## 2023-06-20 DIAGNOSIS — M530 Cervicocranial syndrome: Secondary | ICD-10-CM | POA: Diagnosis not present

## 2023-06-20 DIAGNOSIS — M5431 Sciatica, right side: Secondary | ICD-10-CM | POA: Diagnosis not present

## 2023-06-20 DIAGNOSIS — R262 Difficulty in walking, not elsewhere classified: Secondary | ICD-10-CM | POA: Diagnosis not present

## 2023-06-20 NOTE — Telephone Encounter (Signed)
[facility] is calling in because pt says they have completed Teriparatide, Recombinant, (FORTEO) 600 MCG/2.4ML SOPN [[id number]]  and would like to discontinue the prescription. [facility] needs something from the office stating that the therapy is completed or the prescription is being discontinued. It can be faxed to: [phone]

## 2023-06-25 DIAGNOSIS — Z79891 Long term (current) use of opiate analgesic: Secondary | ICD-10-CM | POA: Diagnosis not present

## 2023-06-25 DIAGNOSIS — G8928 Other chronic postprocedural pain: Secondary | ICD-10-CM | POA: Diagnosis not present

## 2023-06-25 DIAGNOSIS — Z981 Arthrodesis status: Secondary | ICD-10-CM | POA: Diagnosis not present

## 2023-06-25 DIAGNOSIS — M25551 Pain in right hip: Secondary | ICD-10-CM | POA: Diagnosis not present

## 2023-06-25 DIAGNOSIS — G8929 Other chronic pain: Secondary | ICD-10-CM | POA: Diagnosis not present

## 2023-06-25 DIAGNOSIS — M7061 Trochanteric bursitis, right hip: Secondary | ICD-10-CM | POA: Diagnosis not present

## 2023-06-25 DIAGNOSIS — Z5181 Encounter for therapeutic drug level monitoring: Secondary | ICD-10-CM | POA: Diagnosis not present

## 2023-06-25 DIAGNOSIS — M542 Cervicalgia: Secondary | ICD-10-CM | POA: Diagnosis not present

## 2023-06-26 ENCOUNTER — Ambulatory Visit: Payer: Self-pay

## 2023-06-26 NOTE — Telephone Encounter (Signed)
  Chief Complaint: Call from insurance company requesting more information regarding any change in therapy, and medication Forteo Symptoms: none Frequency:  Pertinent Negatives: Patient denies  Disposition: [] ED /[] Urgent Care (no appt availability in office) / [] Appointment(In office/virtual)/ []  [place] [facility]/ [] Home Care/ [] Refused Recommended Disposition /[] [place] [facility]/ [x]  Follow-up with PCP Additional Notes: Returned call to insurance and s/w Marnisha. Bernerd Limbo needs more information regarding any changes in therapy for pt, and medication changes. Please return her call.    Summary: Forteo - clarification   Asha requesting clarification for FORTEO, pt declined taking prescription. Renard Hamper would like a call back for clarification.  Best call back number: [phone]     Reason for Disposition  [1] Caller requests to speak ONLY to PCP AND [2] NON-URGENT question  Answer Assessment - Initial Assessment Questions 1. REASON FOR CALL or QUESTION: "What is your reason for calling today?" or "How can I best help you?" or "What question do you have that I can help answer?"     Has therapy for this pt changed? Is she still on Forteo?  Please clarify. 2. CALLER: Document the source of call. (e.g., laboratory, patient).     Insurance  Protocols used: PCP Call - No Triage-A-AH

## 2023-06-26 NOTE — Telephone Encounter (Signed)
Called pt and verified if she has decided to not take it anymore. Pt stated she will no longer take the medicine due to a discussion pt and neurosurgeon provider discussed. Called insurance on wait for 15+mins no answer.

## 2023-06-27 DIAGNOSIS — M5432 Sciatica, left side: Secondary | ICD-10-CM | POA: Diagnosis not present

## 2023-06-27 DIAGNOSIS — M5451 Vertebrogenic low back pain: Secondary | ICD-10-CM | POA: Diagnosis not present

## 2023-06-27 DIAGNOSIS — M530 Cervicocranial syndrome: Secondary | ICD-10-CM | POA: Diagnosis not present

## 2023-06-27 DIAGNOSIS — R262 Difficulty in walking, not elsewhere classified: Secondary | ICD-10-CM | POA: Diagnosis not present

## 2023-06-27 DIAGNOSIS — M542 Cervicalgia: Secondary | ICD-10-CM | POA: Diagnosis not present

## 2023-06-27 DIAGNOSIS — M5431 Sciatica, right side: Secondary | ICD-10-CM | POA: Diagnosis not present

## 2023-06-29 DIAGNOSIS — M5432 Sciatica, left side: Secondary | ICD-10-CM | POA: Diagnosis not present

## 2023-06-29 DIAGNOSIS — M5451 Vertebrogenic low back pain: Secondary | ICD-10-CM | POA: Diagnosis not present

## 2023-06-29 DIAGNOSIS — M5431 Sciatica, right side: Secondary | ICD-10-CM | POA: Diagnosis not present

## 2023-06-29 DIAGNOSIS — R262 Difficulty in walking, not elsewhere classified: Secondary | ICD-10-CM | POA: Diagnosis not present

## 2023-06-29 DIAGNOSIS — M530 Cervicocranial syndrome: Secondary | ICD-10-CM | POA: Diagnosis not present

## 2023-06-29 DIAGNOSIS — M542 Cervicalgia: Secondary | ICD-10-CM | POA: Diagnosis not present

## 2023-07-02 ENCOUNTER — Other Ambulatory Visit: Payer: Self-pay | Admitting: Family Medicine

## 2023-07-02 ENCOUNTER — Other Ambulatory Visit: Payer: Self-pay | Admitting: Nurse Practitioner

## 2023-07-02 DIAGNOSIS — J4521 Mild intermittent asthma with (acute) exacerbation: Secondary | ICD-10-CM

## 2023-07-02 DIAGNOSIS — R262 Difficulty in walking, not elsewhere classified: Secondary | ICD-10-CM | POA: Diagnosis not present

## 2023-07-02 DIAGNOSIS — M542 Cervicalgia: Secondary | ICD-10-CM | POA: Diagnosis not present

## 2023-07-02 DIAGNOSIS — M797 Fibromyalgia: Secondary | ICD-10-CM

## 2023-07-02 DIAGNOSIS — M5451 Vertebrogenic low back pain: Secondary | ICD-10-CM | POA: Diagnosis not present

## 2023-07-02 DIAGNOSIS — M5431 Sciatica, right side: Secondary | ICD-10-CM | POA: Diagnosis not present

## 2023-07-02 DIAGNOSIS — G629 Polyneuropathy, unspecified: Secondary | ICD-10-CM

## 2023-07-02 DIAGNOSIS — G8929 Other chronic pain: Secondary | ICD-10-CM

## 2023-07-02 DIAGNOSIS — M5432 Sciatica, left side: Secondary | ICD-10-CM | POA: Diagnosis not present

## 2023-07-02 DIAGNOSIS — M530 Cervicocranial syndrome: Secondary | ICD-10-CM | POA: Diagnosis not present

## 2023-07-02 NOTE — Telephone Encounter (Signed)
Appt sch'd with Erin Mecum for 07/04/2023

## 2023-07-02 NOTE — Telephone Encounter (Signed)
Per sowles needs appt before refills can be sent

## 2023-07-03 DIAGNOSIS — M542 Cervicalgia: Secondary | ICD-10-CM | POA: Diagnosis not present

## 2023-07-03 DIAGNOSIS — M47892 Other spondylosis, cervical region: Secondary | ICD-10-CM | POA: Diagnosis not present

## 2023-07-03 DIAGNOSIS — M47812 Spondylosis without myelopathy or radiculopathy, cervical region: Secondary | ICD-10-CM | POA: Diagnosis not present

## 2023-07-03 DIAGNOSIS — G4486 Cervicogenic headache: Secondary | ICD-10-CM | POA: Diagnosis not present

## 2023-07-03 DIAGNOSIS — G8929 Other chronic pain: Secondary | ICD-10-CM | POA: Diagnosis not present

## 2023-07-03 NOTE — Telephone Encounter (Signed)
Requested Prescriptions  Pending Prescriptions Disp Refills   montelukast (SINGULAIR) 10 MG tablet [Pharmacy Med Name: MONTELUKAST SOD 10 MG TABLET] 90 tablet 1    Sig: Take 1 tablet (10 mg total) by mouth at bedtime.     Pulmonology:  Leukotriene Inhibitors Passed - 07/02/2023  9:33 AM      Passed - Valid encounter within last 12 months    Recent Outpatient Visits           3 months ago Primary hypertension   [place] [facility] Danelle Berry, PA-C   4 months ago Type 2 diabetes mellitus without complication, without long-term current use of insulin [facility])   [place] [facility] Danelle Berry, PA-C   7 months ago Hypercalcemia   [facility] Danelle Berry, [place]   9 months ago Primary hypertension   [place] [facility] Danelle Berry, PA-C   10 months ago Type 2 diabetes mellitus without complication, without long-term current use of insulin (HCC)   [facility] Mecum, Oswaldo Conroy, PA-C       Future Appointments             Tomorrow Mecum, Oswaldo Conroy, PA-C  [facility]

## 2023-07-04 ENCOUNTER — Ambulatory Visit: Payer: Medicare PPO | Admitting: Physician Assistant

## 2023-07-04 ENCOUNTER — Ambulatory Visit: Payer: Self-pay

## 2023-07-04 ENCOUNTER — Encounter: Payer: Self-pay | Admitting: Physician Assistant

## 2023-07-04 VITALS — BP 130/82 | HR 75 | Temp 97.8°F | Resp 16 | Ht 64.0 in | Wt 199.2 lb

## 2023-07-04 DIAGNOSIS — E1169 Type 2 diabetes mellitus with other specified complication: Secondary | ICD-10-CM | POA: Diagnosis not present

## 2023-07-04 DIAGNOSIS — E1159 Type 2 diabetes mellitus with other circulatory complications: Secondary | ICD-10-CM | POA: Diagnosis not present

## 2023-07-04 DIAGNOSIS — E039 Hypothyroidism, unspecified: Secondary | ICD-10-CM

## 2023-07-04 DIAGNOSIS — R262 Difficulty in walking, not elsewhere classified: Secondary | ICD-10-CM | POA: Diagnosis not present

## 2023-07-04 DIAGNOSIS — E785 Hyperlipidemia, unspecified: Secondary | ICD-10-CM

## 2023-07-04 DIAGNOSIS — Z7984 Long term (current) use of oral hypoglycemic drugs: Secondary | ICD-10-CM

## 2023-07-04 DIAGNOSIS — E119 Type 2 diabetes mellitus without complications: Secondary | ICD-10-CM

## 2023-07-04 DIAGNOSIS — M530 Cervicocranial syndrome: Secondary | ICD-10-CM | POA: Diagnosis not present

## 2023-07-04 DIAGNOSIS — M5451 Vertebrogenic low back pain: Secondary | ICD-10-CM | POA: Diagnosis not present

## 2023-07-04 DIAGNOSIS — M5412 Radiculopathy, cervical region: Secondary | ICD-10-CM

## 2023-07-04 DIAGNOSIS — I10 Essential (primary) hypertension: Secondary | ICD-10-CM

## 2023-07-04 DIAGNOSIS — M5432 Sciatica, left side: Secondary | ICD-10-CM | POA: Diagnosis not present

## 2023-07-04 DIAGNOSIS — M5431 Sciatica, right side: Secondary | ICD-10-CM | POA: Diagnosis not present

## 2023-07-04 DIAGNOSIS — M542 Cervicalgia: Secondary | ICD-10-CM | POA: Diagnosis not present

## 2023-07-04 MED ORDER — LOSARTAN POTASSIUM 25 MG PO TABS
25.0000 mg | ORAL_TABLET | Freq: Every day | ORAL | 1 refills | Status: DC
Start: 2023-07-04 — End: 2023-10-12

## 2023-07-04 MED ORDER — PREGABALIN 100 MG PO CAPS: ORAL_CAPSULE | ORAL | 0 refills | Status: DC

## 2023-07-04 NOTE — Assessment & Plan Note (Signed)
Chronic, historic condition Recheck lipid panel today Results to dictate further management She is currently taking Vytorin 10-40 mg p.o. nightly and appears to be tolerating well.  Continue this Follow-up in 3 months or sooner if concerns arise

## 2023-07-04 NOTE — Assessment & Plan Note (Signed)
Chronic, ongoing Recheck TSH and T4 today Results to dictate further management.  for now continue levothyroxine 75 mcg p.o. daily Follow-up in 3 months or sooner if concerns arise

## 2023-07-04 NOTE — Telephone Encounter (Signed)
Summary: med ?   Pt called in has another bp med that was prescribed, (losartan) and she wans to know if she should take the amlodpine with this or just the losartan     Chief Complaint: Pt. Seen today,  States she forgot she takes amlodipine 5 mg daily from cardiology. Should she still start her new BP pill? Symptoms: n/a Frequency: n/a Pertinent Negatives: Patient denies  Disposition: [] ED /[] Urgent Care (no appt availability in office) / [] Appointment(In office/virtual)/ []  [place] [facility]/ [] Home Care/ [] Refused Recommended Disposition /[] [place] [facility]/ [x]  Follow-up with PCP Additional Notes: Please advise pt.  Reason for Disposition  [1] Caller has URGENT medicine question about med that PCP or specialist prescribed AND [2] triager unable to answer question  Answer Assessment - Initial Assessment Questions 1. NAME of MEDICINE: "What medicine(s) are you calling about?"     Pt. States she forgot she takes amlodipine 5 mg daily from cardiology 2. QUESTION: "What is your question?" (e.g., double dose of medicine, side effect)     N/a 3. PRESCRIBER: "Who prescribed the medicine?" Reason: if prescribed by specialist, call should be referred to that group.     Cardiology 4. SYMPTOMS: "Do you have any symptoms?" If Yes, ask: "What symptoms are you having?"  "How bad are the symptoms (e.g., mild, moderate, severe)     N/a 5. PREGNANCY:  "Is there any chance that you are pregnant?" "When was your last menstrual period?"     No  Protocols used: Medication Question Call-A-AH

## 2023-07-04 NOTE — Assessment & Plan Note (Signed)
Chronic, ongoing Patient is currently seeing [facility] pain management. Reviewed most recent encounter note from 06/25/2023. They are currently managing tizanidine, Roxicodone, Xtampza. Will provide refills of Lyrica as requested. PDMP reviewed - no records of her Lyrica or oxycodone scripts available in database Both scripts are showing in med list as being prescribed in 2024  [facility] to discuss fill records and PDMP discrepancy- they deny abnormal or early use patterns in the last year on their side Continue with current regimen as directed. Would prefer Pain management to manage all pain medications to prevent contract issues or dosing concerns Recommend follow up in 3 months or sooner if concerns arise

## 2023-07-04 NOTE — Assessment & Plan Note (Signed)
>>  ASSESSMENT AND PLAN FOR PREDIABETES WRITTEN ON 07/04/2023 12:47 PM BY MECUM, ERIN E, PA-C  Chronic, historic condition Appears well-managed on current regimen comprised of metformin  500 mg p.o. daily Most recent A1c was 6.3%.  Recheck today.  Results to dictate further management Continue current regimen for now Follow-up in 3 months or sooner if concerns arise

## 2023-07-04 NOTE — Assessment & Plan Note (Signed)
Chronic, ongoing Reviewed BP home measure records which show routinely elevated BP.  Please see scanned document for further info Will start losartan 25 mg p.o. daily.  Continue hydralazine 10 mg p.o. twice daily as needed, metoprolol 50 mg p.o. daily, spironolactone 25 mg p.o. daily for now Continue to check blood pressures at home at least daily Will review records at follow-up.  Would like to eventually get her off of spironolactone and hydralazine if blood pressure comes into goal Recommend follow-up in about 4 weeks or sooner if concerns arise

## 2023-07-04 NOTE — Patient Instructions (Signed)
Please try to wear compression stockings. Use your shoe size and then measure around the widest part of your calf to get the appropriate size stocking I typically recommend 15-25 mmHg compression force for daily wear. Please put these on first thing in the morning and wear until the evening time.  These should not be painful or cut into your legs but they may be pretty snug. If they cause pain, please take them off.   It was nice to see you and I appreciate the opportunity to be involved in your care If you were satisfied with the care you received from me, I would greatly appreciate you saying so in the after-visit survey that is sent out following our visit.

## 2023-07-04 NOTE — Assessment & Plan Note (Signed)
Chronic, historic condition Appears well-managed on current regimen comprised of metformin 500 mg p.o. daily Most recent A1c was 6.3%.  Recheck today.  Results to dictate further management Continue current regimen for now Follow-up in 3 months or sooner if concerns arise

## 2023-07-04 NOTE — Progress Notes (Signed)
Established Patient Office Visit  Name: Brittany Hill   MRN: [id number]    DOB: 03-Apr-1951   Date:07/04/2023  Today's Provider: Jacquelin Hawking, MHS, PA-C Introduced myself to the patient as a PA-C and provided education on APPs in clinical practice.         Subjective  Chief Complaint  Chief Complaint  Patient presents with   Medication Refill    pregabalin    HPI   Neck Pain/  She reports ongoing neck pain with associated headaches  She is seeing [facility] regularly    Hypertension: - Medications: Hydralazine as needed, Spironolactone, Metoprolol  - Compliance: good compliance  - Checking BP at home: yes checking daily- see scanned record in chart  She reports feeling nauseous when BP is high,  She reports chronic, recurrent headaches from neck pain   Diabetes, Type 2 - Last A1c 6.3%  - Medications: Metformin 500 mg PO every day - Compliance: good  - Checking BG at home: not checking  - Eye exam: UTD - Foot exam: UTD - Microalbumin: UTD - Statin: on statin therapy  - PNA vaccine: completed     Patient Active Problem List   Diagnosis Date Noted   History of total knee replacement, bilateral 05/19/2022   At moderate risk for fall 05/19/2022   Syncope 01/17/2022   Dyslipidemia associated with type 2 diabetes mellitus (HCC) 07/12/2021   Acquired stenosis of bilateral nasolacrimal duct 02/08/2021   Type 2 diabetes mellitus without complication, without long-term current use of insulin (HCC) 03/22/2020   Acquired trigger finger 01/06/2020   Bouchard's nodes (with arthropathy) 01/06/2020   Chronic post-operative pain 09/05/2019   Neck pain 09/05/2019   Cervical radiculopathy 09/05/2019   Displacement of cervical intervertebral disc 05/14/2019   Fatty liver disease, nonalcoholic 03/28/2019   Osteopenia 10/20/2018   Obesity (BMI 30.0-34.9) 09/27/2018   Cataract, left 04/02/2018   Osteoarthritis of left ankle 02/16/2018   Asthma  without status asthmaticus 10/05/2017   Genetic testing 08/28/2017   Family hx-breast malignancy 08/02/2017   Dense breast tissue on mammogram 07/28/2016   Left sided sciatica 12/19/2015   Left lumbar radiculopathy 12/16/2015   Hypomagnesemia 12/06/2015   Hypokalemia 12/02/2015   Fibromyalgia    MS (multiple sclerosis) (HCC)    Chronic constipation    OA (osteoarthritis)    Hypothyroidism    IFG (impaired fasting glucose)    Hypertension    Hyperlipidemia    GERD (gastroesophageal reflux disease)    MDD (major depressive disorder), recurrent, in full remission (HCC)    Anxiety    TMJ syndrome     Past Surgical History:  Procedure Laterality Date   ABDOMINAL HYSTERECTOMY     complete at age 34; HRT for ~25y   BACK SURGERY     CATARACT EXTRACTION W/PHACO Left 09/27/2017   Procedure: CATARACT EXTRACTION PHACO AND INTRAOCULAR LENS PLACEMENT (IOC);  Surgeon: Nevada Crane, MD;  Location: [facility] ORS;  Service: Ophthalmology;  Laterality: Left;  Lot #[id number] [place]:   00:27.2 AP%:  13.7 CDE:  3.75   CATARACT EXTRACTION W/PHACO Right 10/25/2017   Procedure: CATARACT EXTRACTION PHACO AND INTRAOCULAR LENS PLACEMENT (IOC);  Surgeon: Nevada Crane, MD;  Location: [facility] ORS;  Service: Ophthalmology;  Laterality: Right;  Lot # [id number] H [place]:   00:23.9 AP%   :8.7 CDE:   2.10   Cataracts Bilateral    09/27/17 and 10/25/17   CHOLECYSTECTOMY  05/27/2020  COLONOSCOPY WITH ESOPHAGOGASTRODUODENOSCOPY (EGD)  12/2017   [facility]   EYE SURGERY  2022   JOINT REPLACEMENT     KNEE ARTHROSCOPY Right    x 2   lumbarectomy with fusion  12/06/2015   ROTATOR CUFF REPAIR     SPINE SURGERY  2018?   tear duct      surgery   TMJ ARTHROPLASTY     TOTAL KNEE ARTHROPLASTY Bilateral 09/2007    Family History  Problem Relation Age of Onset   Heart disease Mother    Hypertension Mother    Lymphoma Mother 4       deceased 35   Arthritis Mother    Cancer Mother    Varicose Veins  Mother    Stroke Father    Heart attack Father    Heart disease Father    Hypertension Father    Alcohol abuse Father    Early death Father    Hypertension Sister    Colon cancer Sister 29   Cancer Sister 18   Miscarriages / Stillbirths Sister    Hypertension Brother    Diabetes Brother    Mental illness Brother    Depression Brother    Lung cancer Brother 62       smoker; currently 91   Cancer Brother    Learning disabilities Brother    Alcohol abuse Son    Breast cancer Maternal Aunt 53       currently 65   Breast cancer Maternal Aunt        dx 82s; mets in 60s   AAA (abdominal aortic aneurysm) Maternal Grandmother    Heart disease Maternal Grandmother    Heart attack Paternal Grandfather    Throat cancer Paternal Uncle        smoker    Social History   Tobacco Use   Smoking status: Never   Smokeless tobacco: Never  Substance Use Topics   Alcohol use: No     Current Outpatient Medications:    acetaminophen (TYLENOL) 500 MG tablet, Take 1,000 mg by mouth every 6 (six) hours as needed., Disp: , Rfl:    cholecalciferol (VITAMIN D3) 25 MCG (1000 UNIT) tablet, Take 1,000 Units by mouth daily., Disp: , Rfl:    DULoxetine (CYMBALTA) 20 MG capsule, Take 2 CAPSULES IN THE MORNING AND ONE IN THE EVENING., Disp: 270 capsule, Rfl: 0   EPINEPHrine 0.3 mg/0.3 mL IJ SOAJ injection, Inject 0.3 mg into the muscle once. , Disp: , Rfl:    ezetimibe-simvastatin (VYTORIN) 10-40 MG tablet, Take 1 tablet by mouth at bedtime., Disp: 90 tablet, Rfl: 2   famotidine (PEPCID) 20 MG tablet, Take 1 tablet (20 mg total) by mouth 2 (two) times daily as needed for heartburn or indigestion., Disp: 60 tablet, Rfl: 5   hydrALAZINE (APRESOLINE) 10 MG tablet, Take 1 tablet (10 mg total) by mouth 2 (two) times daily as needed (take for sustained BP >160/100)., Disp: 30 tablet, Rfl: 1   Insulin Pen Needle 32G X 4 MM MISC, Use as directed with forteo injection daily, Disp: 90 each, Rfl: 2   ipratropium  (ATROVENT) 0.03 % nasal spray, Place 2 sprays into both nostrils every 12 (twelve) hours., Disp: 30 mL, Rfl: 12   levothyroxine (SYNTHROID) 75 MCG tablet, Take 1 tablet (75 mcg total) by mouth daily before breakfast., Disp: 90 tablet, Rfl: 2   losartan (COZAAR) 25 MG tablet, Take 1 tablet (25 mg total) by mouth daily., Disp: 30 tablet, Rfl: 1  meloxicam (MOBIC) 15 MG tablet, Take 1 tablet (15 mg total) by mouth daily., Disp: 90 tablet, Rfl: 0   metFORMIN (GLUCOPHAGE-XR) 500 MG 24 hr tablet, Take 1 tablet (500 mg total) by mouth 2 (two) times daily., Disp: 180 tablet, Rfl: 3   metoprolol succinate (TOPROL-XL) 50 MG 24 hr tablet, Take 1 tablet (50 mg total) by mouth once daily, Disp: 90 tablet, Rfl: 1   montelukast (SINGULAIR) 10 MG tablet, Take 1 tablet (10 mg total) by mouth at bedtime., Disp: 90 tablet, Rfl: 1   naloxone (NARCAN) nasal spray 4 mg/0.1 mL, Place into the nose., Disp: , Rfl:    ondansetron (ZOFRAN ODT) 4 MG disintegrating tablet, Take 1 tablet (4 mg total) by mouth every 8 (eight) hours as needed for nausea or vomiting., Disp: 20 tablet, Rfl: 1   oxyCODONE (OXY IR/ROXICODONE) 5 MG immediate release tablet, Take 5 mg by mouth 2 (two) times daily as needed., Disp: , Rfl:    oxycodone (OXY-IR) 5 MG capsule, Take 5 mg by mouth every 6 (six) hours as needed., Disp: , Rfl:    pantoprazole (PROTONIX) 40 MG tablet, Take 1 tablet (40 mg total) by mouth at bedtime., Disp: 90 tablet, Rfl: 0   spironolactone (ALDACTONE) 25 MG tablet, Take 1 tablet (25 mg total) by mouth daily. With breakfast, Disp: 90 tablet, Rfl: 0   Teriparatide, Recombinant, (FORTEO) 600 MCG/2.4ML SOPN, Inject 20 mcg into the skin daily. (DISCARD REMAINDER IN PEN 28 DAYS AFTER INITIAL USE), for osteoporosis, Disp: 2.4 mL, Rfl: 2   tiZANidine (ZANAFLEX) 4 MG tablet, Take 2 tablets (8 mg total) by mouth 3 (three) times daily., Disp: 540 tablet, Rfl: 1   XTAMPZA ER 9 MG C12A, Take 1 capsule by mouth daily., Disp: , Rfl:     albuterol (VENTOLIN HFA) 108 (90 Base) MCG/ACT inhaler, Inhale 2 puffs into the lungs every 6 (six) hours as needed for wheezing or shortness of breath., Disp: 18 g, Rfl: 0   calcium carbonate (OS-CAL) 600 MG TABS tablet, Take 600 mg by mouth as needed.  (Patient not taking: Reported on 09/19/2022), Disp: , Rfl:    fluticasone-salmeterol (ADVAIR) 100-50 MCG/ACT AEPB, Inhale 1 puff into the lungs 2 (two) times daily. (Patient not taking: Reported on 09/19/2022), Disp: 1 each, Rfl: 1   pregabalin (LYRICA) 100 MG capsule, Take 1 capsule (100 mg total) by mouth in the morning AND 2 capsules (200 mg total) at bedtime., Disp: 270 capsule, Rfl: 0  Allergies  Allergen Reactions   Shellfish Allergy Rash and Swelling   Codeine Nausea And Vomiting   Sulfa Antibiotics Swelling    I personally reviewed active problem list, medication list, allergies, health maintenance, notes from last encounter, lab results with the patient/caregiver today.   Review of Systems  Eyes:  Negative for blurred vision, double vision and photophobia.  Respiratory:  Negative for shortness of breath and wheezing.   Cardiovascular:  Positive for leg swelling (her feet and ankles have been swelling over the last few weeks). Negative for chest pain and palpitations.  Gastrointestinal:  Positive for nausea.  Musculoskeletal:  Positive for neck pain.  Neurological:  Positive for dizziness (has started having dizziness in the last few weeks), tingling (chronic from neck/spine) and headaches.      Objective  Vitals:   07/04/23 1102  BP: 130/82  Pulse: 75  Resp: 16  Temp: 97.8 F (36.6 C)  TempSrc: Oral  SpO2: 98%  Weight: 199 lb 3.2 oz (90.4 kg)  Height: 5\' 4"  (1.626 m)    Body mass index is 34.19 kg/m.  Physical Exam Vitals reviewed.  Constitutional:      General: She is awake.     Appearance: Normal appearance. She is well-developed and well-groomed.  HENT:     Head: Normocephalic and atraumatic.   Cardiovascular:     Rate and Rhythm: Normal rate and regular rhythm.     Pulses: Normal pulses.          Radial pulses are 2+ on the right side and 2+ on the left side.     Heart sounds: Normal heart sounds. No murmur heard.    No friction rub. No gallop.  Pulmonary:     Effort: Pulmonary effort is normal.     Breath sounds: Normal breath sounds. No decreased air movement. No decreased breath sounds, wheezing, rhonchi or rales.  Musculoskeletal:     Cervical back: Normal range of motion.     Right lower leg: No edema.     Left lower leg: No edema.  Neurological:     General: No focal deficit present.     Mental Status: She is alert and oriented to person, place, and time. Mental status is at baseline.     GCS: GCS eye subscore is 4. GCS verbal subscore is 5. GCS motor subscore is 6.  Psychiatric:        Attention and Perception: Attention and perception normal.        Mood and Affect: Mood and affect normal.        Speech: Speech normal.        Behavior: Behavior normal. Behavior is cooperative.        Thought Content: Thought content normal.        Cognition and Memory: Cognition normal.      Recent Results (from the past 2160 hour(s))  HM DIABETES EYE EXAM     Status: None   Collection Time: 05/31/23  3:21 PM  Result Value Ref Range   HM Diabetic Eye Exam No Retinopathy No Retinopathy    Comment: Abs by HIM     PHQ2/9:    07/04/2023   11:02 AM 03/12/2023   11:24 AM 02/19/2023   10:49 AM 12/05/2022    2:52 PM 10/05/2022   10:05 AM  Depression screen PHQ 2/9  Decreased Interest 0 1 2 0 1  Down, Depressed, Hopeless 0 1 2 0 1  PHQ - 2 Score 0 2 4 0 2  Altered sleeping  1 1 0 0  Tired, decreased energy  1 1 0 0  Change in appetite  0 0 0 0  Feeling bad or failure about yourself   0 0 0 0  Trouble concentrating  0 0 0 0  Moving slowly or fidgety/restless  0 0 0 0  Suicidal thoughts  0 0 0 0  PHQ-9 Score  4 6 0 2  Difficult doing work/chores  Somewhat difficult  Somewhat difficult Not difficult at all Somewhat difficult      Fall Risk:    07/04/2023   11:01 AM 03/12/2023   11:24 AM 02/19/2023   10:49 AM 12/05/2022    2:51 PM 10/05/2022    9:58 AM  Fall Risk   Falls in the past year? 0 0 0 1 1  Number falls in past yr: 0 0 0 0 0  Injury with Fall? 0 0 0 1 0  Comment     hurt left thumb  Risk for  fall due to : No Fall Risks No Fall Risks No Fall Risks Impaired balance/gait No Fall Risks  Follow up Falls prevention discussed;Education provided;Falls evaluation completed Falls prevention discussed;Education provided;Falls evaluation completed Falls prevention discussed;Education provided;Falls evaluation completed Falls prevention discussed;Education provided;Falls evaluation completed Education provided;Falls prevention discussed      Functional Status Survey: Is the patient deaf or have difficulty hearing?: No Does the patient have difficulty seeing, even when wearing glasses/contacts?: No Does the patient have difficulty concentrating, remembering, or making decisions?: No Does the patient have difficulty walking or climbing stairs?: No Does the patient have difficulty dressing or bathing?: No Does the patient have difficulty doing errands alone such as visiting a doctor's office or shopping?: No    Assessment & Plan  Problem List Items Addressed This Visit       Cardiovascular and Mediastinum   Hypertension - Primary (Chronic)    Chronic, ongoing Reviewed BP home measure records which show routinely elevated BP.  Please see scanned document for further info Will start losartan 25 mg p.o. daily.  Continue hydralazine 10 mg p.o. twice daily as needed, metoprolol 50 mg p.o. daily, spironolactone 25 mg p.o. daily for now Continue to check blood pressures at home at least daily Will review records at follow-up.  Would like to eventually get her off of spironolactone and hydralazine if blood pressure comes into goal Recommend follow-up in  about 4 weeks or sooner if concerns arise      Relevant Medications   losartan (COZAAR) 25 MG tablet   Other Relevant Orders   COMPLETE METABOLIC PANEL WITH GFR   CBC w/Diff/Platelet     Endocrine   Hypothyroidism (Chronic)    Chronic, ongoing Recheck TSH and T4 today Results to dictate further management.  for now continue levothyroxine 75 mcg p.o. daily Follow-up in 3 months or sooner if concerns arise      Relevant Orders   TSH   T4   Type 2 diabetes mellitus without complication, without long-term current use of insulin (HCC)    Chronic, historic condition Appears well-managed on current regimen comprised of metformin 500 mg p.o. daily Most recent A1c was 6.3%.  Recheck today.  Results to dictate further management Continue current regimen for now Follow-up in 3 months or sooner if concerns arise      Relevant Medications   losartan (COZAAR) 25 MG tablet   Other Relevant Orders   HgB A1c   Dyslipidemia associated with type 2 diabetes mellitus (HCC)    Chronic, historic condition Recheck lipid panel today Results to dictate further management She is currently taking Vytorin 10-40 mg p.o. nightly and appears to be tolerating well.  Continue this Follow-up in 3 months or sooner if concerns arise      Relevant Medications   losartan (COZAAR) 25 MG tablet   Other Relevant Orders   Lipid Profile     Nervous and Auditory   Cervical radiculopathy    Chronic, ongoing Patient is currently seeing [facility]. Reviewed most recent encounter note from 06/25/2023. They are currently managing tizanidine, Roxicodone, Xtampza. Will provide refills of Lyrica as requested. PDMP reviewed - no records of her Lyrica or oxycodone scripts available in database Both scripts are showing in med list as being prescribed in 2024  [facility] to discuss fill records and PDMP discrepancy- they deny abnormal or early use patterns in the last year on their  side Continue with current regimen as directed. Would prefer Pain management  to manage all pain medications to prevent contract issues or dosing concerns Recommend follow up in 3 months or sooner if concerns arise       Relevant Medications   pregabalin (LYRICA) 100 MG capsule     Return in about 3 months (around 10/04/2023) for HLD, HTN, DM, neck pain, Hypothyroidism , Annual Physical.   I, Klaudia Beirne E Journi Moffa, PA-C, have reviewed all documentation for this visit. The documentation on 07/04/23 for the exam, diagnosis, procedures, and orders are all accurate and complete.   Jacquelin Hawking, MHS, PA-C [facility]

## 2023-07-04 NOTE — Telephone Encounter (Signed)
Called pt and relayed per Denny Peon note: Will start losartan 25 mg p.o. daily.  Continue hydralazine 10 mg p.o. twice daily as needed, metoprolol 50 mg p.o. daily, spironolactone 25 mg p.o. daily for now Continue to check blood pressures at home at least daily.  I advised we do not prescribe amlodipine to her and not under current med list. She can contact provider who dose prescribe it to her per her visit documentation, do not take amlodipine. Pt verbalized understanding

## 2023-07-05 LAB — CBC WITH DIFFERENTIAL/PLATELET
Absolute Lymphocytes: 2166 {cells}/uL (ref 850–3900)
Absolute Monocytes: 703 {cells}/uL (ref 200–950)
Basophils Absolute: 28 {cells}/uL (ref 0–200)
Basophils Relative: 0.4 %
Eosinophils Absolute: 263 {cells}/uL (ref 15–500)
Eosinophils Relative: 3.7 %
HCT: 37.5 % (ref 35.0–45.0)
Hemoglobin: 12.6 g/dL (ref 11.7–15.5)
MCH: 30.5 pg (ref 27.0–33.0)
MCHC: 33.6 g/dL (ref 32.0–36.0)
MCV: 90.8 fL (ref 80.0–100.0)
MPV: 11 fL (ref 7.5–12.5)
Monocytes Relative: 9.9 %
Neutro Abs: 3941 {cells}/uL (ref 1500–7800)
Neutrophils Relative %: 55.5 %
Platelets: 229 10*3/uL (ref 140–400)
RBC: 4.13 10*6/uL (ref 3.80–5.10)
RDW: 12.6 % (ref 11.0–15.0)
Total Lymphocyte: 30.5 %
WBC: 7.1 10*3/uL (ref 3.8–10.8)

## 2023-07-05 LAB — COMPLETE METABOLIC PANEL WITH GFR
AG Ratio: 2 (calc) (ref 1.0–2.5)
ALT: 21 U/L (ref 6–29)
AST: 25 U/L (ref 10–35)
Albumin: 5.1 g/dL (ref 3.6–5.1)
Alkaline phosphatase (APISO): 51 U/L (ref 37–153)
BUN: 12 mg/dL (ref 7–25)
CO2: 29 mmol/L (ref 20–32)
Calcium: 11.1 mg/dL — ABNORMAL HIGH (ref 8.6–10.4)
Chloride: 97 mmol/L — ABNORMAL LOW (ref 98–110)
Creat: 0.97 mg/dL (ref 0.60–1.00)
Globulin: 2.5 g/dL (ref 1.9–3.7)
Glucose, Bld: 97 mg/dL (ref 65–99)
Potassium: 4.8 mmol/L (ref 3.5–5.3)
Sodium: 134 mmol/L — ABNORMAL LOW (ref 135–146)
Total Bilirubin: 0.5 mg/dL (ref 0.2–1.2)
Total Protein: 7.6 g/dL (ref 6.1–8.1)
eGFR: 62 mL/min/{1.73_m2} (ref >=60)

## 2023-07-05 LAB — HEMOGLOBIN A1C
Hgb A1c MFr Bld: 6.1 %{Hb} — ABNORMAL HIGH (ref <5.7)
Mean Plasma Glucose: 128 mg/dL
eAG (mmol/L): 7.1 mmol/L

## 2023-07-05 LAB — LIPID PANEL
Cholesterol: 163 mg/dL (ref <200)
HDL: 49 mg/dL — ABNORMAL LOW (ref >=50)
LDL Cholesterol (Calc): 85 mg/dL
Non-HDL Cholesterol (Calc): 114 mg/dL (ref <130)
Total CHOL/HDL Ratio: 3.3 (calc) (ref <5.0)
Triglycerides: 197 mg/dL — ABNORMAL HIGH (ref <150)

## 2023-07-05 LAB — TSH: TSH: 2.88 m[IU]/L (ref 0.40–4.50)

## 2023-07-05 LAB — T4: T4, Total: 8.9 ug/dL (ref 5.1–11.9)

## 2023-07-09 DIAGNOSIS — M859 Disorder of bone density and structure, unspecified: Secondary | ICD-10-CM | POA: Diagnosis not present

## 2023-07-09 DIAGNOSIS — Z981 Arthrodesis status: Secondary | ICD-10-CM | POA: Diagnosis not present

## 2023-07-09 DIAGNOSIS — M47816 Spondylosis without myelopathy or radiculopathy, lumbar region: Secondary | ICD-10-CM | POA: Diagnosis not present

## 2023-07-09 DIAGNOSIS — M48061 Spinal stenosis, lumbar region without neurogenic claudication: Secondary | ICD-10-CM | POA: Diagnosis not present

## 2023-07-09 NOTE — Progress Notes (Signed)
Your labs are back Overall your cholesterol looks pretty good.  Your triglycerides remain elevated since they were last checked 10 months ago.  Triglycerides are usually elevated if you have not been fasting prior to lab work.  We should continue to keep an eye on this and your next set of labs should be done while you are fasting for at least 8 hours Your liver and kidney testing is in normal limits.  Your sodium and your chloride were little low.  Please make sure that you are getting a well-balanced diet.  Your calcium was mildly elevated.  At this time I would recommend reducing your intake of calcium rich foods and make sure you are staying well-hydrated.  We can recheck this in about 6 weeks to make sure it is stabilizing.   Your A1c is stable at 6.1% Your CBC is overall normal, no signs of anemia Your thyroid testing is normal Please continue your current medication regimen and let us know if you have further questions or concerns

## 2023-07-11 DIAGNOSIS — M530 Cervicocranial syndrome: Secondary | ICD-10-CM | POA: Diagnosis not present

## 2023-07-11 DIAGNOSIS — M5432 Sciatica, left side: Secondary | ICD-10-CM | POA: Diagnosis not present

## 2023-07-11 DIAGNOSIS — M5451 Vertebrogenic low back pain: Secondary | ICD-10-CM | POA: Diagnosis not present

## 2023-07-11 DIAGNOSIS — M5431 Sciatica, right side: Secondary | ICD-10-CM | POA: Diagnosis not present

## 2023-07-11 DIAGNOSIS — M542 Cervicalgia: Secondary | ICD-10-CM | POA: Diagnosis not present

## 2023-07-11 DIAGNOSIS — R262 Difficulty in walking, not elsewhere classified: Secondary | ICD-10-CM | POA: Diagnosis not present

## 2023-07-12 ENCOUNTER — Other Ambulatory Visit: Payer: Self-pay | Admitting: Family Medicine

## 2023-07-12 DIAGNOSIS — R1310 Dysphagia, unspecified: Secondary | ICD-10-CM

## 2023-07-12 DIAGNOSIS — M26629 Arthralgia of temporomandibular joint, unspecified side: Secondary | ICD-10-CM

## 2023-07-12 DIAGNOSIS — M5416 Radiculopathy, lumbar region: Secondary | ICD-10-CM | POA: Diagnosis not present

## 2023-07-12 DIAGNOSIS — M48061 Spinal stenosis, lumbar region without neurogenic claudication: Secondary | ICD-10-CM | POA: Diagnosis not present

## 2023-07-12 DIAGNOSIS — Z9889 Other specified postprocedural states: Secondary | ICD-10-CM

## 2023-07-12 DIAGNOSIS — Z981 Arthrodesis status: Secondary | ICD-10-CM | POA: Diagnosis not present

## 2023-07-12 DIAGNOSIS — M810 Age-related osteoporosis without current pathological fracture: Secondary | ICD-10-CM

## 2023-07-12 NOTE — Telephone Encounter (Signed)
Medication Refill -  Most Recent Primary Care Visit:  Provider: Jacquelin Hawking E  Department: [facility]  Visit Type: OFFICE VISIT  Date: 07/04/2023  Medication: Teriparatide, Recombinant, (FORTEO) 600 MCG/2.4ML SOPN Pt informed by Neurosurgeon at [facility] (Dr. Gearlean Alf) she should continue taking medication. Requesting refill.   Has the patient contacted their pharmacy? No (Agent: If no, request that the patient contact the pharmacy for the refill. If patient does not wish to contact the pharmacy document the reason why and proceed with request.) Pt believed medication was discontinued from her med list, so she called office for refill.   Is this the correct pharmacy for this prescription? Yes  This is the patient's preferred pharmacy:   [facility] - [place], [place] - [address] [place] [address] Phone: [phone] Fax: [phone]   Has the prescription been filled recently? Yes  Is the patient out of the medication? No Pt has enough until middle of month.   Has the patient been seen for an appointment in the last year OR does the patient have an upcoming appointment? Yes  Can we respond through MyChart? Yes  Agent: Please be advised that Rx refills may take up to 3 business days. We ask that you follow-up with your pharmacy.

## 2023-07-13 ENCOUNTER — Other Ambulatory Visit: Payer: Self-pay | Admitting: Nurse Practitioner

## 2023-07-13 DIAGNOSIS — M542 Cervicalgia: Secondary | ICD-10-CM | POA: Diagnosis not present

## 2023-07-13 DIAGNOSIS — M530 Cervicocranial syndrome: Secondary | ICD-10-CM | POA: Diagnosis not present

## 2023-07-13 DIAGNOSIS — E782 Mixed hyperlipidemia: Secondary | ICD-10-CM

## 2023-07-13 DIAGNOSIS — M5451 Vertebrogenic low back pain: Secondary | ICD-10-CM | POA: Diagnosis not present

## 2023-07-13 DIAGNOSIS — M5432 Sciatica, left side: Secondary | ICD-10-CM | POA: Diagnosis not present

## 2023-07-13 DIAGNOSIS — R262 Difficulty in walking, not elsewhere classified: Secondary | ICD-10-CM | POA: Diagnosis not present

## 2023-07-13 DIAGNOSIS — M5431 Sciatica, right side: Secondary | ICD-10-CM | POA: Diagnosis not present

## 2023-07-13 NOTE — Telephone Encounter (Signed)
Requested medication (s) are due for refill today- provider review   Requested medication (s) are on the active medication list -yes  Future visit scheduled -yes  Last refill: 05/28/23 2.67ml 2RF  Notes to clinic: off protocol- provider review   Requested Prescriptions  Pending Prescriptions Disp Refills   Teriparatide (FORTEO) 600 MCG/2.4ML SOPN 2.4 mL 2    Sig: Inject 20 mcg into the skin daily. (DISCARD REMAINDER IN PEN 28 DAYS AFTER INITIAL USE), for osteoporosis     Off-Protocol Failed - 07/12/2023  3:57 PM      Failed - Medication not assigned to a protocol, review manually.      Passed - Valid encounter within last 12 months    Recent Outpatient Visits           1 week ago Primary hypertension   [place] [facility] Mecum, Oswaldo Conroy, PA-C   4 months ago Primary hypertension   [place] [facility] Danelle Berry, PA-C   4 months ago Type 2 diabetes mellitus without complication, without long-term current use of insulin [facility])   [place] [facility] Danelle Berry, PA-C   7 months ago Hypercalcemia   [facility] Danelle Berry, PA-C   9 months ago Primary hypertension   [place] [facility] Danelle Berry, PA-C       Future Appointments             In 3 months Mecum, Oswaldo Conroy, PA-C [place] [facility]               Requested Prescriptions  Pending Prescriptions Disp Refills   Teriparatide (FORTEO) 600 MCG/2.4ML SOPN 2.4 mL 2    Sig: Inject 20 mcg into the skin daily. (DISCARD REMAINDER IN PEN 28 DAYS AFTER INITIAL USE), for osteoporosis     Off-Protocol Failed - 07/12/2023  3:57 PM      Failed - Medication not assigned to a protocol, review manually.      Passed - Valid encounter within last 12 months    Recent Outpatient Visits           1 week ago Primary hypertension   [place] [facility] Mecum, Oswaldo Conroy, PA-C   4 months ago  Primary hypertension   [place] [facility] Danelle Berry, PA-C   4 months ago Type 2 diabetes mellitus without complication, without long-term current use of insulin [facility])   [place] [facility] Danelle Berry, PA-C   7 months ago Hypercalcemia   [facility] Danelle Berry, PA-C   9 months ago Primary hypertension   [place] [facility] Danelle Berry, PA-C       Future Appointments             In 3 months Mecum, Oswaldo Conroy, PA-C [place] [facility], [facility]

## 2023-07-16 DIAGNOSIS — R262 Difficulty in walking, not elsewhere classified: Secondary | ICD-10-CM | POA: Diagnosis not present

## 2023-07-16 DIAGNOSIS — M5432 Sciatica, left side: Secondary | ICD-10-CM | POA: Diagnosis not present

## 2023-07-16 DIAGNOSIS — M5451 Vertebrogenic low back pain: Secondary | ICD-10-CM | POA: Diagnosis not present

## 2023-07-16 DIAGNOSIS — M530 Cervicocranial syndrome: Secondary | ICD-10-CM | POA: Diagnosis not present

## 2023-07-16 DIAGNOSIS — M5431 Sciatica, right side: Secondary | ICD-10-CM | POA: Diagnosis not present

## 2023-07-16 DIAGNOSIS — M542 Cervicalgia: Secondary | ICD-10-CM | POA: Diagnosis not present

## 2023-07-16 NOTE — Telephone Encounter (Signed)
Requested Prescriptions  Pending Prescriptions Disp Refills   ezetimibe-simvastatin (VYTORIN) 10-40 MG tablet [Pharmacy Med Name: EZETIMIBE-SIMV 10-40 MG] 90 tablet 0    Sig: Take 1 tablet by mouth at bedtime.     Cardiovascular:  Antilipid - Sterol Transport Inhibitors Failed - 07/13/2023  6:55 PM      Failed - Lipid Panel in normal range within the last 12 months    Cholesterol, Total  Date Value Ref Range Status  06/03/2015 150 100 - 199 mg/dL Final   Cholesterol  Date Value Ref Range Status  07/04/2023 163 <200 mg/dL Final   LDL Cholesterol (Calc)  Date Value Ref Range Status  07/04/2023 85 mg/dL (calc) Final    Comment:    Reference range: <100 . Desirable range <100 mg/dL for primary prevention;   <70 mg/dL for patients with CHD or diabetic patients  with > or = 2 CHD risk factors. Marland Kitchen LDL-C is now calculated using the Martin-Hopkins  calculation, which is a validated novel method providing  better accuracy than the Friedewald equation in the  estimation of LDL-C.  Horald Pollen et al. Lenox Ahr. 3474;259(56): 2061-2068  (http://education.[employer].com/faq/FAQ164)    HDL  Date Value Ref Range Status  07/04/2023 49 (L) > OR = 50 mg/dL Final  38/75/6433 56 >29 mg/dL Final    Comment:    According to ATP-III Guidelines, HDL-C >59 mg/dL is considered a negative risk factor for CHD.    Triglycerides  Date Value Ref Range Status  07/04/2023 197 (H) <150 mg/dL Final         Passed - AST in normal range and within 360 days    AST  Date Value Ref Range Status  07/04/2023 25 10 - 35 U/L Final         Passed - ALT in normal range and within 360 days    ALT  Date Value Ref Range Status  07/04/2023 21 6 - 29 U/L Final         Passed - Patient is not pregnant      Passed - Valid encounter within last 12 months    Recent Outpatient Visits           1 week ago Primary hypertension   [place] [facility] Mecum, Oswaldo Conroy, PA-C   4 months ago Primary  hypertension   [place] [facility] Danelle Berry, PA-C   4 months ago Type 2 diabetes mellitus without complication, without long-term current use of insulin [facility])   [place] [facility] Danelle Berry, PA-C   7 months ago Hypercalcemia   [facility] Danelle Berry, PA-C   10 months ago Primary hypertension    [facility] Danelle Berry, PA-C       Future Appointments             In 3 months Mecum, Oswaldo Conroy, PA-C [facility], [facility]

## 2023-07-18 DIAGNOSIS — M5431 Sciatica, right side: Secondary | ICD-10-CM | POA: Diagnosis not present

## 2023-07-18 DIAGNOSIS — M5451 Vertebrogenic low back pain: Secondary | ICD-10-CM | POA: Diagnosis not present

## 2023-07-18 DIAGNOSIS — R262 Difficulty in walking, not elsewhere classified: Secondary | ICD-10-CM | POA: Diagnosis not present

## 2023-07-18 DIAGNOSIS — M5432 Sciatica, left side: Secondary | ICD-10-CM | POA: Diagnosis not present

## 2023-07-18 DIAGNOSIS — M530 Cervicocranial syndrome: Secondary | ICD-10-CM | POA: Diagnosis not present

## 2023-07-18 DIAGNOSIS — M542 Cervicalgia: Secondary | ICD-10-CM | POA: Diagnosis not present

## 2023-07-19 MED ORDER — TERIPARATIDE 600 MCG/2.4ML ~~LOC~~ SOPN
20.0000 ug | PEN_INJECTOR | Freq: Every day | SUBCUTANEOUS | 2 refills | Status: DC
Start: 2023-07-19 — End: 2023-10-11

## 2023-07-20 DIAGNOSIS — M5431 Sciatica, right side: Secondary | ICD-10-CM | POA: Diagnosis not present

## 2023-07-20 DIAGNOSIS — M5451 Vertebrogenic low back pain: Secondary | ICD-10-CM | POA: Diagnosis not present

## 2023-07-20 DIAGNOSIS — R262 Difficulty in walking, not elsewhere classified: Secondary | ICD-10-CM | POA: Diagnosis not present

## 2023-07-20 DIAGNOSIS — M530 Cervicocranial syndrome: Secondary | ICD-10-CM | POA: Diagnosis not present

## 2023-07-20 DIAGNOSIS — M542 Cervicalgia: Secondary | ICD-10-CM | POA: Diagnosis not present

## 2023-07-20 DIAGNOSIS — M5432 Sciatica, left side: Secondary | ICD-10-CM | POA: Diagnosis not present

## 2023-07-23 DIAGNOSIS — M5431 Sciatica, right side: Secondary | ICD-10-CM | POA: Diagnosis not present

## 2023-07-23 DIAGNOSIS — M5451 Vertebrogenic low back pain: Secondary | ICD-10-CM | POA: Diagnosis not present

## 2023-07-23 DIAGNOSIS — M5432 Sciatica, left side: Secondary | ICD-10-CM | POA: Diagnosis not present

## 2023-07-23 DIAGNOSIS — M542 Cervicalgia: Secondary | ICD-10-CM | POA: Diagnosis not present

## 2023-07-23 DIAGNOSIS — M530 Cervicocranial syndrome: Secondary | ICD-10-CM | POA: Diagnosis not present

## 2023-07-23 DIAGNOSIS — R262 Difficulty in walking, not elsewhere classified: Secondary | ICD-10-CM | POA: Diagnosis not present

## 2023-07-25 DIAGNOSIS — M542 Cervicalgia: Secondary | ICD-10-CM | POA: Diagnosis not present

## 2023-07-25 DIAGNOSIS — M530 Cervicocranial syndrome: Secondary | ICD-10-CM | POA: Diagnosis not present

## 2023-07-25 DIAGNOSIS — M5432 Sciatica, left side: Secondary | ICD-10-CM | POA: Diagnosis not present

## 2023-07-25 DIAGNOSIS — M5431 Sciatica, right side: Secondary | ICD-10-CM | POA: Diagnosis not present

## 2023-07-25 DIAGNOSIS — M5451 Vertebrogenic low back pain: Secondary | ICD-10-CM | POA: Diagnosis not present

## 2023-07-25 DIAGNOSIS — R262 Difficulty in walking, not elsewhere classified: Secondary | ICD-10-CM | POA: Diagnosis not present

## 2023-07-26 ENCOUNTER — Other Ambulatory Visit: Payer: Self-pay | Admitting: Family Medicine

## 2023-07-26 DIAGNOSIS — F3342 Major depressive disorder, recurrent, in full remission: Secondary | ICD-10-CM

## 2023-07-26 DIAGNOSIS — M797 Fibromyalgia: Secondary | ICD-10-CM

## 2023-07-26 DIAGNOSIS — G8929 Other chronic pain: Secondary | ICD-10-CM

## 2023-07-26 DIAGNOSIS — G629 Polyneuropathy, unspecified: Secondary | ICD-10-CM

## 2023-07-27 DIAGNOSIS — M5431 Sciatica, right side: Secondary | ICD-10-CM | POA: Diagnosis not present

## 2023-07-27 DIAGNOSIS — M542 Cervicalgia: Secondary | ICD-10-CM | POA: Diagnosis not present

## 2023-07-27 DIAGNOSIS — M530 Cervicocranial syndrome: Secondary | ICD-10-CM | POA: Diagnosis not present

## 2023-07-27 DIAGNOSIS — M5432 Sciatica, left side: Secondary | ICD-10-CM | POA: Diagnosis not present

## 2023-07-27 DIAGNOSIS — M5451 Vertebrogenic low back pain: Secondary | ICD-10-CM | POA: Diagnosis not present

## 2023-07-27 DIAGNOSIS — R262 Difficulty in walking, not elsewhere classified: Secondary | ICD-10-CM | POA: Diagnosis not present

## 2023-07-30 DIAGNOSIS — M5432 Sciatica, left side: Secondary | ICD-10-CM | POA: Diagnosis not present

## 2023-07-30 DIAGNOSIS — M542 Cervicalgia: Secondary | ICD-10-CM | POA: Diagnosis not present

## 2023-07-30 DIAGNOSIS — R262 Difficulty in walking, not elsewhere classified: Secondary | ICD-10-CM | POA: Diagnosis not present

## 2023-07-30 DIAGNOSIS — M530 Cervicocranial syndrome: Secondary | ICD-10-CM | POA: Diagnosis not present

## 2023-07-30 DIAGNOSIS — M5431 Sciatica, right side: Secondary | ICD-10-CM | POA: Diagnosis not present

## 2023-07-30 DIAGNOSIS — M5451 Vertebrogenic low back pain: Secondary | ICD-10-CM | POA: Diagnosis not present

## 2023-08-02 DIAGNOSIS — M5431 Sciatica, right side: Secondary | ICD-10-CM | POA: Diagnosis not present

## 2023-08-02 DIAGNOSIS — M5451 Vertebrogenic low back pain: Secondary | ICD-10-CM | POA: Diagnosis not present

## 2023-08-02 DIAGNOSIS — M542 Cervicalgia: Secondary | ICD-10-CM | POA: Diagnosis not present

## 2023-08-02 DIAGNOSIS — R262 Difficulty in walking, not elsewhere classified: Secondary | ICD-10-CM | POA: Diagnosis not present

## 2023-08-02 DIAGNOSIS — M5432 Sciatica, left side: Secondary | ICD-10-CM | POA: Diagnosis not present

## 2023-08-02 DIAGNOSIS — M530 Cervicocranial syndrome: Secondary | ICD-10-CM | POA: Diagnosis not present

## 2023-08-03 DIAGNOSIS — M5432 Sciatica, left side: Secondary | ICD-10-CM | POA: Diagnosis not present

## 2023-08-03 DIAGNOSIS — M530 Cervicocranial syndrome: Secondary | ICD-10-CM | POA: Diagnosis not present

## 2023-08-03 DIAGNOSIS — R262 Difficulty in walking, not elsewhere classified: Secondary | ICD-10-CM | POA: Diagnosis not present

## 2023-08-03 DIAGNOSIS — M5431 Sciatica, right side: Secondary | ICD-10-CM | POA: Diagnosis not present

## 2023-08-03 DIAGNOSIS — M5451 Vertebrogenic low back pain: Secondary | ICD-10-CM | POA: Diagnosis not present

## 2023-08-03 DIAGNOSIS — M542 Cervicalgia: Secondary | ICD-10-CM | POA: Diagnosis not present

## 2023-08-14 DIAGNOSIS — R262 Difficulty in walking, not elsewhere classified: Secondary | ICD-10-CM | POA: Diagnosis not present

## 2023-08-14 DIAGNOSIS — M530 Cervicocranial syndrome: Secondary | ICD-10-CM | POA: Diagnosis not present

## 2023-08-14 DIAGNOSIS — M542 Cervicalgia: Secondary | ICD-10-CM | POA: Diagnosis not present

## 2023-08-14 DIAGNOSIS — M5432 Sciatica, left side: Secondary | ICD-10-CM | POA: Diagnosis not present

## 2023-08-14 DIAGNOSIS — M5451 Vertebrogenic low back pain: Secondary | ICD-10-CM | POA: Diagnosis not present

## 2023-08-14 DIAGNOSIS — M5431 Sciatica, right side: Secondary | ICD-10-CM | POA: Diagnosis not present

## 2023-08-17 DIAGNOSIS — M5451 Vertebrogenic low back pain: Secondary | ICD-10-CM | POA: Diagnosis not present

## 2023-08-17 DIAGNOSIS — M530 Cervicocranial syndrome: Secondary | ICD-10-CM | POA: Diagnosis not present

## 2023-08-17 DIAGNOSIS — M542 Cervicalgia: Secondary | ICD-10-CM | POA: Diagnosis not present

## 2023-08-17 DIAGNOSIS — M5431 Sciatica, right side: Secondary | ICD-10-CM | POA: Diagnosis not present

## 2023-08-17 DIAGNOSIS — M5432 Sciatica, left side: Secondary | ICD-10-CM | POA: Diagnosis not present

## 2023-08-17 DIAGNOSIS — R262 Difficulty in walking, not elsewhere classified: Secondary | ICD-10-CM | POA: Diagnosis not present

## 2023-08-20 ENCOUNTER — Other Ambulatory Visit: Payer: Self-pay | Admitting: Family Medicine

## 2023-08-20 DIAGNOSIS — M5432 Sciatica, left side: Secondary | ICD-10-CM | POA: Diagnosis not present

## 2023-08-20 DIAGNOSIS — R262 Difficulty in walking, not elsewhere classified: Secondary | ICD-10-CM | POA: Diagnosis not present

## 2023-08-20 DIAGNOSIS — E039 Hypothyroidism, unspecified: Secondary | ICD-10-CM

## 2023-08-20 DIAGNOSIS — M530 Cervicocranial syndrome: Secondary | ICD-10-CM | POA: Diagnosis not present

## 2023-08-20 DIAGNOSIS — M5451 Vertebrogenic low back pain: Secondary | ICD-10-CM | POA: Diagnosis not present

## 2023-08-20 DIAGNOSIS — M542 Cervicalgia: Secondary | ICD-10-CM | POA: Diagnosis not present

## 2023-08-20 DIAGNOSIS — M549 Dorsalgia, unspecified: Secondary | ICD-10-CM | POA: Diagnosis not present

## 2023-08-20 DIAGNOSIS — M47814 Spondylosis without myelopathy or radiculopathy, thoracic region: Secondary | ICD-10-CM | POA: Diagnosis not present

## 2023-08-20 DIAGNOSIS — M5431 Sciatica, right side: Secondary | ICD-10-CM | POA: Diagnosis not present

## 2023-08-21 DIAGNOSIS — M530 Cervicocranial syndrome: Secondary | ICD-10-CM | POA: Diagnosis not present

## 2023-08-21 DIAGNOSIS — M5431 Sciatica, right side: Secondary | ICD-10-CM | POA: Diagnosis not present

## 2023-08-21 DIAGNOSIS — R262 Difficulty in walking, not elsewhere classified: Secondary | ICD-10-CM | POA: Diagnosis not present

## 2023-08-21 DIAGNOSIS — M5432 Sciatica, left side: Secondary | ICD-10-CM | POA: Diagnosis not present

## 2023-08-21 DIAGNOSIS — M542 Cervicalgia: Secondary | ICD-10-CM | POA: Diagnosis not present

## 2023-08-21 DIAGNOSIS — M5451 Vertebrogenic low back pain: Secondary | ICD-10-CM | POA: Diagnosis not present

## 2023-08-22 DIAGNOSIS — I38 Endocarditis, valve unspecified: Secondary | ICD-10-CM | POA: Diagnosis not present

## 2023-08-22 DIAGNOSIS — E882 Lipomatosis, not elsewhere classified: Secondary | ICD-10-CM | POA: Diagnosis not present

## 2023-08-22 DIAGNOSIS — Z8709 Personal history of other diseases of the respiratory system: Secondary | ICD-10-CM | POA: Diagnosis not present

## 2023-08-22 DIAGNOSIS — M2428 Disorder of ligament, vertebrae: Secondary | ICD-10-CM | POA: Diagnosis not present

## 2023-08-22 DIAGNOSIS — J45909 Unspecified asthma, uncomplicated: Secondary | ICD-10-CM | POA: Diagnosis not present

## 2023-08-22 DIAGNOSIS — R002 Palpitations: Secondary | ICD-10-CM | POA: Diagnosis not present

## 2023-08-22 DIAGNOSIS — M9981 Other biomechanical lesions of cervical region: Secondary | ICD-10-CM | POA: Diagnosis not present

## 2023-08-22 DIAGNOSIS — I10 Essential (primary) hypertension: Secondary | ICD-10-CM | POA: Diagnosis not present

## 2023-08-22 DIAGNOSIS — Z9889 Other specified postprocedural states: Secondary | ICD-10-CM | POA: Diagnosis not present

## 2023-08-22 DIAGNOSIS — Z981 Arthrodesis status: Secondary | ICD-10-CM | POA: Diagnosis not present

## 2023-08-22 DIAGNOSIS — M5116 Intervertebral disc disorders with radiculopathy, lumbar region: Secondary | ICD-10-CM | POA: Diagnosis not present

## 2023-08-22 DIAGNOSIS — M1288 Other specific arthropathies, not elsewhere classified, other specified site: Secondary | ICD-10-CM | POA: Diagnosis not present

## 2023-08-22 DIAGNOSIS — R600 Localized edema: Secondary | ICD-10-CM | POA: Diagnosis not present

## 2023-08-22 DIAGNOSIS — M5416 Radiculopathy, lumbar region: Secondary | ICD-10-CM | POA: Diagnosis not present

## 2023-08-22 DIAGNOSIS — R0609 Other forms of dyspnea: Secondary | ICD-10-CM | POA: Diagnosis not present

## 2023-08-22 DIAGNOSIS — E782 Mixed hyperlipidemia: Secondary | ICD-10-CM | POA: Diagnosis not present

## 2023-08-22 DIAGNOSIS — M48061 Spinal stenosis, lumbar region without neurogenic claudication: Secondary | ICD-10-CM | POA: Diagnosis not present

## 2023-08-24 DIAGNOSIS — M530 Cervicocranial syndrome: Secondary | ICD-10-CM | POA: Diagnosis not present

## 2023-08-24 DIAGNOSIS — M5431 Sciatica, right side: Secondary | ICD-10-CM | POA: Diagnosis not present

## 2023-08-24 DIAGNOSIS — M5432 Sciatica, left side: Secondary | ICD-10-CM | POA: Diagnosis not present

## 2023-08-24 DIAGNOSIS — R262 Difficulty in walking, not elsewhere classified: Secondary | ICD-10-CM | POA: Diagnosis not present

## 2023-08-24 DIAGNOSIS — M5451 Vertebrogenic low back pain: Secondary | ICD-10-CM | POA: Diagnosis not present

## 2023-08-24 DIAGNOSIS — R0609 Other forms of dyspnea: Secondary | ICD-10-CM | POA: Diagnosis not present

## 2023-08-24 DIAGNOSIS — R002 Palpitations: Secondary | ICD-10-CM | POA: Diagnosis not present

## 2023-08-24 DIAGNOSIS — M542 Cervicalgia: Secondary | ICD-10-CM | POA: Diagnosis not present

## 2023-08-27 DIAGNOSIS — M542 Cervicalgia: Secondary | ICD-10-CM | POA: Diagnosis not present

## 2023-08-27 DIAGNOSIS — M5431 Sciatica, right side: Secondary | ICD-10-CM | POA: Diagnosis not present

## 2023-08-27 DIAGNOSIS — M5451 Vertebrogenic low back pain: Secondary | ICD-10-CM | POA: Diagnosis not present

## 2023-08-27 DIAGNOSIS — M530 Cervicocranial syndrome: Secondary | ICD-10-CM | POA: Diagnosis not present

## 2023-08-27 DIAGNOSIS — R262 Difficulty in walking, not elsewhere classified: Secondary | ICD-10-CM | POA: Diagnosis not present

## 2023-08-27 DIAGNOSIS — M5432 Sciatica, left side: Secondary | ICD-10-CM | POA: Diagnosis not present

## 2023-08-29 DIAGNOSIS — M542 Cervicalgia: Secondary | ICD-10-CM | POA: Diagnosis not present

## 2023-08-29 DIAGNOSIS — M530 Cervicocranial syndrome: Secondary | ICD-10-CM | POA: Diagnosis not present

## 2023-08-29 DIAGNOSIS — M5431 Sciatica, right side: Secondary | ICD-10-CM | POA: Diagnosis not present

## 2023-08-29 DIAGNOSIS — M5432 Sciatica, left side: Secondary | ICD-10-CM | POA: Diagnosis not present

## 2023-08-29 DIAGNOSIS — R262 Difficulty in walking, not elsewhere classified: Secondary | ICD-10-CM | POA: Diagnosis not present

## 2023-08-29 DIAGNOSIS — M5451 Vertebrogenic low back pain: Secondary | ICD-10-CM | POA: Diagnosis not present

## 2023-08-30 ENCOUNTER — Other Ambulatory Visit: Payer: Self-pay | Admitting: Family Medicine

## 2023-08-30 DIAGNOSIS — Z981 Arthrodesis status: Secondary | ICD-10-CM | POA: Diagnosis not present

## 2023-08-30 DIAGNOSIS — K219 Gastro-esophageal reflux disease without esophagitis: Secondary | ICD-10-CM

## 2023-08-31 DIAGNOSIS — R262 Difficulty in walking, not elsewhere classified: Secondary | ICD-10-CM | POA: Diagnosis not present

## 2023-08-31 DIAGNOSIS — M5451 Vertebrogenic low back pain: Secondary | ICD-10-CM | POA: Diagnosis not present

## 2023-08-31 DIAGNOSIS — M5431 Sciatica, right side: Secondary | ICD-10-CM | POA: Diagnosis not present

## 2023-08-31 DIAGNOSIS — M542 Cervicalgia: Secondary | ICD-10-CM | POA: Diagnosis not present

## 2023-08-31 DIAGNOSIS — M5432 Sciatica, left side: Secondary | ICD-10-CM | POA: Diagnosis not present

## 2023-08-31 DIAGNOSIS — M530 Cervicocranial syndrome: Secondary | ICD-10-CM | POA: Diagnosis not present

## 2023-09-04 DIAGNOSIS — I38 Endocarditis, valve unspecified: Secondary | ICD-10-CM | POA: Diagnosis not present

## 2023-09-04 DIAGNOSIS — R0602 Shortness of breath: Secondary | ICD-10-CM | POA: Diagnosis not present

## 2023-09-05 DIAGNOSIS — M5451 Vertebrogenic low back pain: Secondary | ICD-10-CM | POA: Diagnosis not present

## 2023-09-05 DIAGNOSIS — M530 Cervicocranial syndrome: Secondary | ICD-10-CM | POA: Diagnosis not present

## 2023-09-05 DIAGNOSIS — M542 Cervicalgia: Secondary | ICD-10-CM | POA: Diagnosis not present

## 2023-09-05 DIAGNOSIS — R262 Difficulty in walking, not elsewhere classified: Secondary | ICD-10-CM | POA: Diagnosis not present

## 2023-09-05 DIAGNOSIS — M5431 Sciatica, right side: Secondary | ICD-10-CM | POA: Diagnosis not present

## 2023-09-05 DIAGNOSIS — M5432 Sciatica, left side: Secondary | ICD-10-CM | POA: Diagnosis not present

## 2023-09-06 DIAGNOSIS — L820 Inflamed seborrheic keratosis: Secondary | ICD-10-CM | POA: Diagnosis not present

## 2023-09-06 DIAGNOSIS — L718 Other rosacea: Secondary | ICD-10-CM | POA: Diagnosis not present

## 2023-09-06 DIAGNOSIS — L2989 Other pruritus: Secondary | ICD-10-CM | POA: Diagnosis not present

## 2023-09-06 DIAGNOSIS — L821 Other seborrheic keratosis: Secondary | ICD-10-CM | POA: Diagnosis not present

## 2023-09-07 DIAGNOSIS — R262 Difficulty in walking, not elsewhere classified: Secondary | ICD-10-CM | POA: Diagnosis not present

## 2023-09-07 DIAGNOSIS — M542 Cervicalgia: Secondary | ICD-10-CM | POA: Diagnosis not present

## 2023-09-07 DIAGNOSIS — M5432 Sciatica, left side: Secondary | ICD-10-CM | POA: Diagnosis not present

## 2023-09-07 DIAGNOSIS — M530 Cervicocranial syndrome: Secondary | ICD-10-CM | POA: Diagnosis not present

## 2023-09-07 DIAGNOSIS — M5431 Sciatica, right side: Secondary | ICD-10-CM | POA: Diagnosis not present

## 2023-09-07 DIAGNOSIS — M5451 Vertebrogenic low back pain: Secondary | ICD-10-CM | POA: Diagnosis not present

## 2023-09-10 DIAGNOSIS — M5432 Sciatica, left side: Secondary | ICD-10-CM | POA: Diagnosis not present

## 2023-09-10 DIAGNOSIS — M5451 Vertebrogenic low back pain: Secondary | ICD-10-CM | POA: Diagnosis not present

## 2023-09-10 DIAGNOSIS — M542 Cervicalgia: Secondary | ICD-10-CM | POA: Diagnosis not present

## 2023-09-10 DIAGNOSIS — M530 Cervicocranial syndrome: Secondary | ICD-10-CM | POA: Diagnosis not present

## 2023-09-10 DIAGNOSIS — R262 Difficulty in walking, not elsewhere classified: Secondary | ICD-10-CM | POA: Diagnosis not present

## 2023-09-10 DIAGNOSIS — M5431 Sciatica, right side: Secondary | ICD-10-CM | POA: Diagnosis not present

## 2023-09-13 DIAGNOSIS — M530 Cervicocranial syndrome: Secondary | ICD-10-CM | POA: Diagnosis not present

## 2023-09-13 DIAGNOSIS — M5432 Sciatica, left side: Secondary | ICD-10-CM | POA: Diagnosis not present

## 2023-09-13 DIAGNOSIS — M542 Cervicalgia: Secondary | ICD-10-CM | POA: Diagnosis not present

## 2023-09-13 DIAGNOSIS — R262 Difficulty in walking, not elsewhere classified: Secondary | ICD-10-CM | POA: Diagnosis not present

## 2023-09-13 DIAGNOSIS — M5431 Sciatica, right side: Secondary | ICD-10-CM | POA: Diagnosis not present

## 2023-09-13 DIAGNOSIS — M5451 Vertebrogenic low back pain: Secondary | ICD-10-CM | POA: Diagnosis not present

## 2023-09-17 DIAGNOSIS — Z9689 Presence of other specified functional implants: Secondary | ICD-10-CM | POA: Diagnosis not present

## 2023-09-17 DIAGNOSIS — M5412 Radiculopathy, cervical region: Secondary | ICD-10-CM | POA: Diagnosis not present

## 2023-09-17 DIAGNOSIS — M47812 Spondylosis without myelopathy or radiculopathy, cervical region: Secondary | ICD-10-CM | POA: Diagnosis not present

## 2023-09-20 ENCOUNTER — Other Ambulatory Visit: Payer: Self-pay | Admitting: Family Medicine

## 2023-09-20 DIAGNOSIS — M5412 Radiculopathy, cervical region: Secondary | ICD-10-CM | POA: Diagnosis not present

## 2023-09-20 DIAGNOSIS — M542 Cervicalgia: Secondary | ICD-10-CM

## 2023-09-24 DIAGNOSIS — M5451 Vertebrogenic low back pain: Secondary | ICD-10-CM | POA: Diagnosis not present

## 2023-09-24 DIAGNOSIS — M542 Cervicalgia: Secondary | ICD-10-CM | POA: Diagnosis not present

## 2023-09-24 DIAGNOSIS — M5432 Sciatica, left side: Secondary | ICD-10-CM | POA: Diagnosis not present

## 2023-09-24 DIAGNOSIS — M5431 Sciatica, right side: Secondary | ICD-10-CM | POA: Diagnosis not present

## 2023-09-24 DIAGNOSIS — M530 Cervicocranial syndrome: Secondary | ICD-10-CM | POA: Diagnosis not present

## 2023-09-24 DIAGNOSIS — R262 Difficulty in walking, not elsewhere classified: Secondary | ICD-10-CM | POA: Diagnosis not present

## 2023-09-25 DIAGNOSIS — R002 Palpitations: Secondary | ICD-10-CM | POA: Diagnosis not present

## 2023-09-25 DIAGNOSIS — M25551 Pain in right hip: Secondary | ICD-10-CM | POA: Diagnosis not present

## 2023-09-25 DIAGNOSIS — Z79891 Long term (current) use of opiate analgesic: Secondary | ICD-10-CM | POA: Diagnosis not present

## 2023-09-25 DIAGNOSIS — G8929 Other chronic pain: Secondary | ICD-10-CM | POA: Diagnosis not present

## 2023-09-25 DIAGNOSIS — M7061 Trochanteric bursitis, right hip: Secondary | ICD-10-CM | POA: Diagnosis not present

## 2023-09-25 DIAGNOSIS — G8928 Other chronic postprocedural pain: Secondary | ICD-10-CM | POA: Diagnosis not present

## 2023-09-25 DIAGNOSIS — M542 Cervicalgia: Secondary | ICD-10-CM | POA: Diagnosis not present

## 2023-09-25 DIAGNOSIS — Z5181 Encounter for therapeutic drug level monitoring: Secondary | ICD-10-CM | POA: Diagnosis not present

## 2023-09-25 DIAGNOSIS — Z981 Arthrodesis status: Secondary | ICD-10-CM | POA: Diagnosis not present

## 2023-09-26 DIAGNOSIS — I10 Essential (primary) hypertension: Secondary | ICD-10-CM | POA: Diagnosis not present

## 2023-09-28 ENCOUNTER — Other Ambulatory Visit: Payer: Self-pay | Admitting: Physician Assistant

## 2023-09-28 DIAGNOSIS — M5412 Radiculopathy, cervical region: Secondary | ICD-10-CM

## 2023-09-28 NOTE — Telephone Encounter (Signed)
Requested medications are due for refill today.  yes  Requested medications are on the active medications list.  yes  Last refill. 07/04/2023 #270 0 rf  Future visit scheduled.   yes  Notes to clinic.  Refill not delegated.    Requested Prescriptions  Pending Prescriptions Disp Refills   pregabalin (LYRICA) 100 MG capsule [Pharmacy Med Name: PREGABALIN 100 MG CAPSULE] 270 capsule 0    Sig: Take 1 capsule (100 mg total) by mouth in the morning AND 2 capsules (200 mg total) at bedtime.     Not Delegated - Neurology:  Anticonvulsants - Controlled - pregabalin Failed - 09/28/2023  4:41 PM      Failed - This refill cannot be delegated      Passed - Cr in normal range and within 360 days    Creat  Date Value Ref Range Status  07/04/2023 0.97 0.60 - 1.00 mg/dL Final   Creatinine, Urine  Date Value Ref Range Status  02/19/2023 80 20 - 275 mg/dL Final         Passed - Completed PHQ-2 or PHQ-9 in the last 360 days      Passed - Valid encounter within last 12 months    Recent Outpatient Visits           2 months ago Primary hypertension   [place] [facility] Mecum, Oswaldo Conroy, PA-C   6 months ago Primary hypertension   [facility] Danelle Berry, PA-C   7 months ago Type 2 diabetes mellitus without complication, without long-term current use of insulin [facility])   [place] Danelle Berry, PA-C   9 months ago Hypercalcemia   [facility] Danelle Berry, PA-C   1 year ago Primary hypertension   [facility] Danelle Berry, [place]       Future Appointments             In 3 months Alba Cory, MD [facility], [facility]

## 2023-10-01 DIAGNOSIS — I10 Essential (primary) hypertension: Secondary | ICD-10-CM | POA: Diagnosis not present

## 2023-10-01 DIAGNOSIS — Z981 Arthrodesis status: Secondary | ICD-10-CM | POA: Diagnosis not present

## 2023-10-01 DIAGNOSIS — E119 Type 2 diabetes mellitus without complications: Secondary | ICD-10-CM | POA: Diagnosis not present

## 2023-10-01 DIAGNOSIS — M5412 Radiculopathy, cervical region: Secondary | ICD-10-CM | POA: Diagnosis not present

## 2023-10-03 DIAGNOSIS — M5412 Radiculopathy, cervical region: Secondary | ICD-10-CM | POA: Diagnosis not present

## 2023-10-03 DIAGNOSIS — G9340 Encephalopathy, unspecified: Secondary | ICD-10-CM | POA: Diagnosis not present

## 2023-10-03 DIAGNOSIS — I10 Essential (primary) hypertension: Secondary | ICD-10-CM | POA: Diagnosis not present

## 2023-10-04 DIAGNOSIS — M5412 Radiculopathy, cervical region: Secondary | ICD-10-CM | POA: Diagnosis not present

## 2023-10-04 DIAGNOSIS — R4182 Altered mental status, unspecified: Secondary | ICD-10-CM | POA: Diagnosis not present

## 2023-10-04 DIAGNOSIS — G9340 Encephalopathy, unspecified: Secondary | ICD-10-CM | POA: Diagnosis not present

## 2023-10-04 DIAGNOSIS — I10 Essential (primary) hypertension: Secondary | ICD-10-CM | POA: Diagnosis not present

## 2023-10-05 DIAGNOSIS — M5412 Radiculopathy, cervical region: Secondary | ICD-10-CM | POA: Diagnosis not present

## 2023-10-05 DIAGNOSIS — G9340 Encephalopathy, unspecified: Secondary | ICD-10-CM | POA: Diagnosis not present

## 2023-10-05 DIAGNOSIS — I10 Essential (primary) hypertension: Secondary | ICD-10-CM | POA: Diagnosis not present

## 2023-10-08 ENCOUNTER — Telehealth: Payer: Self-pay

## 2023-10-08 NOTE — Transitions of Care (Post Inpatient/ED Visit) (Signed)
 10/08/2023  Name: Brittany Hill MRN: [id number] DOB: 1951/01/27  Today's TOC FU Call Status: Today's TOC FU Call Status:: Successful TOC FU Call Completed TOC FU Call Complete Date: 10/08/23 Patient's Name and Date of Birth confirmed.  Transition Care Management Follow-up Telephone Call Date of Discharge: 10/05/23 Discharge Facility: Other ([facility]) Name of Other ([facility]) [facility] Facility: [facility] Type of Discharge: Inpatient Admission Primary Inpatient Discharge Diagnosis:: Radiculopathy, cervical region [id number]); Encephalopathy, unspecified (E9528) How have you been since you were released from the hospital?: Better Any questions or concerns?: No  Items Reviewed: Did you receive and understand the discharge instructions provided?: Yes Medications obtained,verified, and reconciled?: Yes (Medications Reviewed) (Medication reconciliation completed based on recent discharge summary Patient taking medications as instructed and is aware of any changes or dosage adjustments medication regimen. Patient denies questions and reports no barriers to medication adherence) Any new allergies since your discharge?: No Dietary orders reviewed?: Yes Type of Diet Ordered:: Reg Heart Healthy Carb Mdified Do you have support at home?: Yes People in Home: spouse, child(ren), adult Name of Support/Comfort Primary Source: Spouse Dorene Sorrow , Daughter is a Teacher, early years/pre and assists with care  Medications Reviewed Today: Medications Reviewed Today     Reviewed by Johnnette Barrios, RN (Registered Nurse) on 10/08/23 at 1010  Med List Status: <None>   Medication Order Taking? Sig Documenting Provider Last Dose Status Informant  acetaminophen (TYLENOL) 500 MG tablet [id number] Yes Take 1,000 mg by mouth every 6 (six) hours as needed. [provider] Taking Active   albuterol (VENTOLIN HFA) 108 (90 Base) MCG/ACT inhaler [id number]  Inhale 2 puffs into the lungs every 6 (six) hours as  needed for wheezing or shortness of breath. Alba Cory, MD  Active   baclofen (LIORESAL) 10 MG tablet [id number] Yes Take 10 mg by mouth 3 (three) times daily. Take 1 tablet (10 mg total) by mouth 3 (three) times a day as needed for muscle spasms. [provider] Taking Active   calcium carbonate (OS-CAL) 600 MG TABS tablet [id number] No Take 600 mg by mouth as needed.   Patient not taking: Reported on 09/19/2022   [provider] Not Taking Active Self  cholecalciferol (VITAMIN D3) 25 MCG (1000 UNIT) tablet [id number] Yes Take 1,000 Units by mouth daily. [provider] Taking Active   DULoxetine (CYMBALTA) 20 MG capsule [id number] Yes Take 2 CAPSULES IN THE MORNING AND ONE IN THE EVENING. Danelle Berry, PA-C Taking Active   EPINEPHrine 0.3 mg/0.3 mL IJ SOAJ injection [id number] No Inject 0.3 mg into the muscle once.   Patient not taking: Reported on 10/08/2023   [provider] Not Taking Active Self           Med Note Haywood Pao   Wed Sep 19, 2017 10:09 AM)    ezetimibe-simvastatin (VYTORIN) 10-40 MG tablet [id number] No Take 1 tablet by mouth at bedtime.  Patient not taking: Reported on 10/08/2023   Danelle Berry, PA-C Not Taking Active   famotidine (PEPCID) 20 MG tablet [id number] Yes Take 1 tablet (20 mg total) by mouth 2 (two) times daily as needed for heartburn or indigestion. Danelle Berry, PA-C Taking Active   fluticasone (VERAMYST) 27.5 MCG/SPRAY nasal spray [id number] Yes Place 1 spray into the nose daily. Inhale 1 puff (100 mcg total) every morning. [provider] Taking Active   fluticasone-salmeterol (ADVAIR) 100-50 MCG/ACT AEPB [id number]  Inhale 1 puff into the lungs 2 (two) times daily.  Patient not taking: Reported  on 09/19/2022   Alba Cory, MD  Active   hydrALAZINE (APRESOLINE) 10 MG tablet [id number] Yes Take 1 tablet (10 mg total) by mouth 2 (two) times daily as needed (take for sustained BP >160/100). Danelle Berry, PA-C Taking  Active   ibuprofen (ADVIL) 600 MG tablet [id number] Yes Take 600 mg by mouth every 6 (six) hours. Take 1 tablet (600 mg total) by mouth every 6 (six) hours for 5 days. [provider] Taking Active   Insulin Pen Needle 32G X 4 MM MISC [id number] No Use as directed with forteo injection daily  Patient not taking: Reported on 10/08/2023   Danelle Berry, PA-C Not Taking Active   ipratropium (ATROVENT HFA) 17 MCG/ACT inhaler [id number] Yes Inhale 1 puff into the lungs every 6 (six) hours.  inhaler  Inhale daily. [provider] Taking Active   ipratropium (ATROVENT) 0.03 % nasal spray [id number] No Place 2 sprays into both nostrils every 12 (twelve) hours.  Patient not taking: Reported on 10/08/2023   Danelle Berry, PA-C Not Taking Active   levothyroxine (SYNTHROID) 75 MCG tablet [id number] Yes Take 1 tablet (75 mcg total) by mouth daily before breakfast. Danelle Berry, PA-C Taking Active   losartan (COZAAR) 25 MG tablet [id number] Yes Take 1 tablet (25 mg total) by mouth daily. Mecum, Oswaldo Conroy, PA-C Taking Active   meloxicam (MOBIC) 15 MG tablet [id number] No Take 1 tablet (15 mg total) by mouth daily.  Patient not taking: Reported on 10/08/2023   Danelle Berry, PA-C Not Taking Active   metFORMIN (GLUCOPHAGE-XR) 500 MG 24 hr tablet [id number] Yes Take 1 tablet (500 mg total) by mouth 2 (two) times daily. Danelle Berry, PA-C Taking Active   metoprolol succinate (TOPROL-XL) 50 MG 24 hr tablet [id number] Yes Take 1 tablet (50 mg total) by mouth once daily Danelle Berry, PA-C Taking Active   montelukast (SINGULAIR) 10 MG tablet [id number] Yes Take 1 tablet (10 mg total) by mouth at bedtime. Danelle Berry, PA-C Taking Active   naloxone [facility]) nasal spray 4 mg/0.1 mL [id number] No Place into the nose.  Patient not taking: Reported on 10/08/2023   [provider] Not Taking Active   ondansetron (ZOFRAN ODT) 4 MG disintegrating tablet [id number] Yes Take 1 tablet (4 mg total) by mouth every 8 (eight)  hours as needed for nausea or vomiting. Danelle Berry, PA-C Taking Active   oxyCODONE (OXY IR/ROXICODONE) 5 MG immediate release tablet [id number] Yes Take 5 mg by mouth 2 (two) times daily as needed. [provider] Taking Active   oxycodone (OXY-IR) 5 MG capsule [id number] Yes Take 5 mg by mouth every 6 (six) hours as needed. [provider] Taking Active   pantoprazole (PROTONIX) 40 MG tablet [id number] Yes Take 1 tablet (40 mg total) by mouth at bedtime. Danelle Berry, PA-C Taking Active   pregabalin (LYRICA) 100 MG capsule [id number] No Take 1 capsule (100 mg total) by mouth in the morning AND 2 capsules (200 mg total) at bedtime.  Patient not taking: Reported on 10/08/2023   Danelle Berry, PA-C Not Taking Active   rosuvastatin (CRESTOR) 10 MG tablet [id number] Yes Take 10 mg by mouth daily. Take 1 tablet (10 mg total) by mouth nightly. [provider] Taking Active   sennosides-docusate sodium (SENOKOT-S) 8.6-50 MG tablet [id number] Yes Take 2 tablets by mouth daily. Take 2 tablets by mouth nightly [provider] Taking Active   spironolactone (ALDACTONE) 25 MG tablet [id number] No Take 1 tablet (25 mg total) by mouth daily.  With breakfast  Patient not taking: Reported on 10/08/2023   Danelle Berry, PA-C Not Taking Active   Teriparatide (FORTEO) 600 MCG/2.4ML SOPN [id number] No Inject 20 mcg into the skin daily. (DISCARD REMAINDER IN PEN 28 DAYS AFTER INITIAL USE), for osteoporosis  Patient not taking: Reported on 10/08/2023   Mecum, Oswaldo Conroy, PA-C Not Taking Active   tiZANidine (ZANAFLEX) 4 MG tablet [id number] No Take 2 tablets (8 mg total) by mouth 3 (three) times daily.  Patient not taking: Reported on 10/08/2023   Berniece Salines, FNP Not Taking Active   XTAMPZA ER 9 MG C12A [id number] No Take 1 capsule by mouth daily.  Patient not taking: Reported on 10/08/2023   [provider] Not Taking Active            Medication reconciliation / review completed based  on most recent discharge summary and EHR medication list. Confirmed patient is taking all newly prescribed medications as instructed (any discrepancies are noted in review section)   Patient  is aware of any changes to and / or  any dosage adjustments to medication regimen. Patient denies questions at this time and reports no barriers to medication adherence.   Home Care and Equipment/Supplies: Were Home Health Services Ordered?: No Any new equipment or medical supplies ordered?: No  Functional Questionnaire: Do you need assistance with bathing/showering or dressing?: No Do you need assistance with meal preparation?: No Do you need assistance with eating?: No Do you have difficulty maintaining continence: No Do you need assistance with getting out of bed/getting out of a chair/moving?: No Do you have difficulty managing or taking your medications?: No  Follow up appointments reviewed: PCP Follow-up appointment confirmed?: No (She will call and schedule, D/c Friday  Office was closed weekend) MD Provider Line Number:[phone] Given: No Specialist Hospital Follow-up appointment confirmed?: Yes Date of Specialist follow-up appointment?: 11/15/23 Follow-Up Specialty Provider:: Surgical follow-up Do you need transportation to your follow-up appointment?: No (Both she and spouse drive) Do you understand care options if your condition(s) worsen?: Yes-patient verbalized understanding  SDOH Interventions Today    Flowsheet Row Most Recent Value  SDOH Interventions   Food Insecurity Interventions Intervention Not Indicated  Housing Interventions Intervention Not Indicated  Transportation Interventions Intervention Not Indicated, Patient Resources (Friends/Family)  Utilities Interventions Intervention Not Indicated      Interventions Today    Flowsheet Row Most Recent Value  Chronic Disease   Chronic disease during today's visit Other  General Interventions   General Interventions  Discussed/Reviewed General Interventions Discussed, General Interventions Reviewed, Labs, Doctor Visits  Labs Hgb A1c every 3 months  Doctor Visits Discussed/Reviewed Doctor Visits Discussed, Doctor Visits Reviewed, Annual Wellness Visits, PCP, Specialist  PCP/Specialist Visits Compliance with follow-up visit  Exercise Interventions   Exercise Discussed/Reviewed Physical Activity  Physical Activity Discussed/Reviewed Physical Activity Reviewed  Nutrition Interventions   Nutrition Discussed/Reviewed Nutrition Discussed, Nutrition Reviewed, Carbohydrate meal planning, Decreasing salt, Decreasing sugar intake  Pharmacy Interventions   Pharmacy Dicussed/Reviewed Pharmacy Topics Reviewed, Medications and their functions, Medication Adherence  Safety Interventions   Safety Discussed/Reviewed Safety Reviewed       Benefits reviewed  Based on current information and Insurance plan -Reviewed benefits accessible to patient, including details about eligibility options for care and  available value based care options  if any areas of needs were identified.  Reviewed patient/  caregiver's ability to access and / or  ability with navigating the benefits system..Amb Referral made if indicted , refer to orders section  of note for details   Reviewed goals for care Patient/ Caregiver  verbalizes understanding of instructions and care plan provided. Patient / Caregiver was encouraged to make informed decisions about their care, actively participate in managing their health condition, and implement lifestyle changes as needed to promote independence and self-management of health care. There were no reported  barriers to care.   TOC program  Patient is at high risk for readmission and/or has history of  high utilization  Discussed VBCI  TOC program and weekly calls to patient to assess condition/status, medication management  and provide support/education as indicated . Patient/ Caregiver voiced understanding and  declined enrollment in the 30-day TOC Program.     The patient has been provided with contact information for the care management team and has been advised to call with any health-related questions or concerns. Follow up as indicated with Care Team , or sooner should any new problems arise.   Susa Loffler , BSN, RN [facility]   VBCI-Population Health RN Care Manager Direct Dial [phone]  Fax: [phone] Website: [email or web address]

## 2023-10-11 ENCOUNTER — Ambulatory Visit: Payer: Medicare PPO

## 2023-10-11 DIAGNOSIS — Z0000 Encounter for general adult medical examination without abnormal findings: Secondary | ICD-10-CM | POA: Diagnosis not present

## 2023-10-11 DIAGNOSIS — Z1231 Encounter for screening mammogram for malignant neoplasm of breast: Secondary | ICD-10-CM

## 2023-10-11 NOTE — Patient Instructions (Addendum)
 Ms. Brittany Hill , Thank you for taking time to come for your Medicare Wellness Visit. I appreciate your ongoing commitment to your health goals. Please review the following plan we discussed and let me know if I can assist you in the future.   Referrals/Orders/Follow-Ups/Clinician Recommendations: REFERRAL FOR MAMMOGRAM SENT  You have an order for:  []   2D Mammogram  [x]   3D Mammogram  []   Bone Density     Please call for appointment:  [facility]  [address] [place] [address] [phone] [facility] [address] [place], [place] [address] [phone] [facility] [address] Geanie Logan [place], [place] [address] [phone]   Make sure to wear two-piece clothing.  No lotions, powders, or deodorants the day of the appointment. Make sure to bring picture ID and insurance card.  Bring list of medications you are currently taking including any supplements.   Schedule your [place] screening mammogram through MyChart!   Log into your MyChart account.  Go to 'Visit' (or 'Appointments' if on mobile App) --> Schedule an Appointment  Under 'Select a Reason for Visit' choose the Mammogram Screening option.  Complete the pre-visit questions and select the time and place that best fits your schedule.   This is a list of the screening recommended for you and due dates:  Health Maintenance  Topic Date Due   Zoster (Shingles) Vaccine (1 of 2) [birth year]09/1970   COVID-19 Vaccine (6 - 2024-25 season) 04/08/2023   Colon Cancer Screening  02/19/2024*   Mammogram  11/27/2023   Hemoglobin A1C  01/01/2024   Yearly kidney health urinalysis for diabetes  02/19/2024   Complete foot exam   02/19/2024   Eye exam for diabetics  05/30/2024   Yearly kidney function blood test for diabetes  07/03/2024   Medicare Annual Wellness Visit  10/10/2024   DEXA scan (bone density  measurement)  11/26/2024   DTaP/Tdap/Td vaccine (3 - Td or Tdap) 03/27/2030   Pneumonia Vaccine  Completed   Flu Shot  Completed   Hepatitis C Screening  Addressed   HPV Vaccine  Aged Out  *Topic was postponed. The date shown is not the original due date.    Advanced directives: (ACP Link)Information on Advanced Care Planning can be found at [facility] [place] Advance Health Care Directives Advance Health Care Directives ([email or web address]   Next Medicare Annual Wellness Visit scheduled for next year: Yes   10/16/24 @ 9:30 AM BY VIDEO

## 2023-10-11 NOTE — Progress Notes (Signed)
 Subjective:   Brittany Hill is a 73 y.o. who presents for a Medicare Wellness preventive visit.  Visit Complete: Virtual I connected with  Brittany Hill on 10/11/23 by a video and audio enabled telemedicine application and verified that I am speaking with the correct person using two identifiers.  Patient Location: Home  Provider Location: Office/Clinic  I discussed the limitations of evaluation and management by telemedicine. The patient expressed understanding and agreed to proceed.  Vital Signs: Because this visit was a virtual/telehealth visit, some criteria may be missing or patient reported. Any vitals not documented were not able to be obtained and vitals that have been documented are patient reported.   AWV Questionnaire: No: Patient Medicare AWV questionnaire was not completed prior to this visit.  Cardiac Risk Factors include: advanced age (>86men, >31 women);dyslipidemia;hypertension;diabetes mellitus;obesity (BMI >30kg/m2)     Objective:    Today's Vitals   10/11/23 0957  PainSc: 6    There is no height or weight on file to calculate BMI.     10/11/2023   10:07 AM 10/05/2022   10:14 AM 09/13/2021    3:41 PM 09/09/2020    3:55 PM 08/14/2019   10:21 AM 08/06/2018   11:45 AM 10/25/2017    7:43 AM  Advanced Directives  Does Patient Have a Medical Advance Directive? No Yes Yes Yes Yes Yes Yes  Type of Furniture conservator/restorer;Living will Healthcare Power of [place];Living will Healthcare Power of [place];Living will Healthcare Power of [place];Living will Healthcare Power of [place];Living will Healthcare Power of [place];Living will  Does patient want to make changes to medical advance directive?       No - Patient declined  Copy of Healthcare Power of Attorney in Chart?  No - copy requested No - copy requested No - copy requested No - copy requested No - copy requested No - copy requested  Would patient like information on creating a  medical advance directive? No - Patient declined          Current Medications (verified) Outpatient Encounter Medications as of 10/11/2023  Medication Sig   acetaminophen (TYLENOL) 500 MG tablet Take 1,000 mg by mouth every 6 (six) hours as needed.   DULoxetine (CYMBALTA) 20 MG capsule Take 2 CAPSULES IN THE MORNING AND ONE IN THE EVENING.   EPINEPHrine 0.3 mg/0.3 mL IJ SOAJ injection Inject 0.3 mg into the muscle once.   famotidine (PEPCID) 20 MG tablet Take 1 tablet (20 mg total) by mouth 2 (two) times daily as needed for heartburn or indigestion.   fluticasone (VERAMYST) 27.5 MCG/SPRAY nasal spray Place 1 spray into the nose daily. Inhale 1 puff (100 mcg total) every morning.   hydrALAZINE (APRESOLINE) 10 MG tablet Take 1 tablet (10 mg total) by mouth 2 (two) times daily as needed (take for sustained BP >160/100).   ibuprofen (ADVIL) 600 MG tablet Take 600 mg by mouth every 6 (six) hours. Take 1 tablet (600 mg total) by mouth every 6 (six) hours for 5 days.   Insulin Pen Needle 32G X 4 MM MISC Use as directed with forteo injection daily   ipratropium (ATROVENT HFA) 17 MCG/ACT inhaler Inhale 1 puff into the lungs every 6 (six) hours.  inhaler  Inhale daily.   ipratropium (ATROVENT) 0.03 % nasal spray Place 2 sprays into both nostrils every 12 (twelve) hours.   levothyroxine (SYNTHROID) 75 MCG tablet Take 1 tablet (75 mcg total) by mouth daily before breakfast.  metFORMIN (GLUCOPHAGE-XR) 500 MG 24 hr tablet Take 1 tablet (500 mg total) by mouth 2 (two) times daily.   metoprolol succinate (TOPROL-XL) 50 MG 24 hr tablet Take 1 tablet (50 mg total) by mouth once daily   montelukast (SINGULAIR) 10 MG tablet Take 1 tablet (10 mg total) by mouth at bedtime.   naloxone (NARCAN) nasal spray 4 mg/0.1 mL Place into the nose.   ondansetron (ZOFRAN ODT) 4 MG disintegrating tablet Take 1 tablet (4 mg total) by mouth every 8 (eight) hours as needed for nausea or vomiting.   oxyCODONE (OXY IR/ROXICODONE) 5  MG immediate release tablet Take 5 mg by mouth 2 (two) times daily as needed.   oxycodone (OXY-IR) 5 MG capsule Take 5 mg by mouth every 6 (six) hours as needed.   pantoprazole (PROTONIX) 40 MG tablet Take 1 tablet (40 mg total) by mouth at bedtime.   pregabalin (LYRICA) 100 MG capsule Take 1 capsule (100 mg total) by mouth in the morning AND 2 capsules (200 mg total) at bedtime.   rosuvastatin (CRESTOR) 10 MG tablet Take 10 mg by mouth daily. Take 1 tablet (10 mg total) by mouth nightly.   spironolactone (ALDACTONE) 25 MG tablet Take 1 tablet (25 mg total) by mouth daily. With breakfast   tiZANidine (ZANAFLEX) 4 MG tablet Take 2 tablets (8 mg total) by mouth 3 (three) times daily.   XTAMPZA ER 9 MG C12A Take 1 capsule by mouth daily.   albuterol (VENTOLIN HFA) 108 (90 Base) MCG/ACT inhaler Inhale 2 puffs into the lungs every 6 (six) hours as needed for wheezing or shortness of breath.   baclofen (LIORESAL) 10 MG tablet Take 10 mg by mouth 3 (three) times daily. Take 1 tablet (10 mg total) by mouth 3 (three) times a day as needed for muscle spasms.   calcium carbonate (OS-CAL) 600 MG TABS tablet Take 600 mg by mouth as needed.  (Patient not taking: Reported on 09/19/2022)   cholecalciferol (VITAMIN D3) 25 MCG (1000 UNIT) tablet Take 1,000 Units by mouth daily. (Patient not taking: Reported on 10/11/2023)   ezetimibe-simvastatin (VYTORIN) 10-40 MG tablet Take 1 tablet by mouth at bedtime. (Patient not taking: Reported on 10/11/2023)   fluticasone-salmeterol (ADVAIR) 100-50 MCG/ACT AEPB Inhale 1 puff into the lungs 2 (two) times daily. (Patient not taking: Reported on 09/19/2022)   losartan (COZAAR) 25 MG tablet Take 1 tablet (25 mg total) by mouth daily.   meloxicam (MOBIC) 15 MG tablet Take 1 tablet (15 mg total) by mouth daily. (Patient not taking: Reported on 10/08/2023)   sennosides-docusate sodium (SENOKOT-S) 8.6-50 MG tablet Take 2 tablets by mouth daily. Take 2 tablets by mouth nightly (Patient not  taking: Reported on 10/11/2023)   Teriparatide (FORTEO) 600 MCG/2.4ML SOPN Inject 20 mcg into the skin daily. (DISCARD REMAINDER IN PEN 28 DAYS AFTER INITIAL USE), for osteoporosis (Patient not taking: Reported on 10/11/2023)   No facility-administered encounter medications on file as of 10/11/2023.    Allergies (verified) Shellfish allergy, Codeine, Sulfa antibiotics, and Shellfish-derived products   History: Past Medical History:  Diagnosis Date   Allergy    Anxiety    Asthma    as a teenager   Blood transfusion without reported diagnosis 2009   My own blood   Cataract    Chronic constipation    Complication of anesthesia    Depression    Dysrhythmia    Fibromyalgia    Bells Palsy   Full code status 08/02/2017   Patient  wishes to have resuscitative efforts made if collapses; but does not want to remain on life support or if condition is terminal -- see copy of living will   Genetic testing 08/28/2017   Multi-Cancer panel (83 genes) @ [facility] - No pathogenic mutations detected   GERD (gastroesophageal reflux disease)    Heart murmur    Hyperlipidemia    Hypertension    Hypothyroidism    IFG (impaired fasting glucose)    MS (multiple sclerosis) (HCC)    hx of long-term corticosteroid use   OA (osteoarthritis)    osteoporosis   Osteoporosis 05/2014   managed by Rheumatologist   Pneumonia 2017   PONV (postoperative nausea and vomiting)    TMJ syndrome    Past Surgical History:  Procedure Laterality Date   ABDOMINAL HYSTERECTOMY     complete at age 31; HRT for ~25y   BACK SURGERY     CATARACT EXTRACTION W/PHACO Left 09/27/2017   Procedure: CATARACT EXTRACTION PHACO AND INTRAOCULAR LENS PLACEMENT (IOC);  Surgeon: Nevada Crane, MD;  Location: [facility] ORS;  Service: Ophthalmology;  Laterality: Left;  Lot #[id number] H [place]:   00:27.2 AP%:  13.7 CDE:  3.75   CATARACT EXTRACTION W/PHACO Right 10/25/2017   Procedure: CATARACT EXTRACTION PHACO AND INTRAOCULAR LENS PLACEMENT  (IOC);  Surgeon: Nevada Crane, MD;  Location: [facility] ORS;  Service: Ophthalmology;  Laterality: Right;  Lot # [id number] H [place]:   00:23.9 AP%   :8.7 CDE:   2.10   Cataracts Bilateral    09/27/17 and 10/25/17   CHOLECYSTECTOMY  05/27/2020   COLONOSCOPY WITH ESOPHAGOGASTRODUODENOSCOPY (EGD)  12/2017   [facility]   EYE SURGERY  2022   JOINT REPLACEMENT     KNEE ARTHROSCOPY Right    x 2   lumbarectomy with fusion  12/06/2015   ROTATOR CUFF REPAIR     SPINE SURGERY  2018?   tear duct      surgery   TMJ ARTHROPLASTY     TOTAL KNEE ARTHROPLASTY Bilateral 09/2007   Family History  Problem Relation Age of Onset   Heart disease Mother    Hypertension Mother    Lymphoma Mother 40       deceased 64   Arthritis Mother    Cancer Mother    Varicose Veins Mother    Stroke Father    Heart attack Father    Heart disease Father    Hypertension Father    Alcohol abuse Father    Early death Father    Hypertension Sister    Colon cancer Sister 41   Cancer Sister 14   Miscarriages / Stillbirths Sister    Hypertension Brother    Diabetes Brother    Mental illness Brother    Depression Brother    Lung cancer Brother 38       smoker; currently 70   Cancer Brother    Learning disabilities Brother    Alcohol abuse Son    Breast cancer Maternal Aunt 92       currently 42   Breast cancer Maternal Aunt        dx 59s; mets in 94s   AAA (abdominal aortic aneurysm) Maternal Grandmother    Heart disease Maternal Grandmother    Heart attack Paternal Grandfather    Throat cancer Paternal Uncle        smoker   Social History   Socioeconomic History   Marital status: Married    Spouse name: Dorene Sorrow   Number of children:  2   Years of education: Not on file   Highest education level: Master's degree (e.g., MA, MS, MEng, MEd, MSW, MBA)  Occupational History   Occupation: retired  Tobacco Use   Smoking status: Never   Smokeless tobacco: Never  Vaping Use   Vaping status: Never  Used  Substance and Sexual Activity   Alcohol use: No   Drug use: No   Sexual activity: Not Currently    Birth control/protection: None  Other Topics Concern   Not on file  Social History Narrative   Not on file   Social Drivers of Health   Financial Resource Strain: Low Risk  (10/11/2023)   Overall Financial Resource Strain (CARDIA)    Difficulty of Paying Living Expenses: Not hard at all  Food Insecurity: No Food Insecurity (10/11/2023)   Hunger Vital Sign    Worried About Running Out of Food in the Last Year: Never true    Ran Out of Food in the Last Year: Never true  Transportation Needs: No Transportation Needs (10/11/2023)   PRAPARE - Administrator, Civil Service (Medical): No    Lack of Transportation (Non-Medical): No  Physical Activity: Inactive (10/11/2023)   Exercise Vital Sign    Days of Exercise per Week: 0 days    Minutes of Exercise per Session: 0 min  Stress: Stress Concern Present (10/11/2023)   [employer] of Occupational Health - Occupational Stress Questionnaire    Feeling of Stress : To some extent  Social Connections: Socially Integrated (10/11/2023)   Social Connection and Isolation Panel [NHANES]    Frequency of Communication with Friends and Family: More than three times a week    Frequency of Social Gatherings with Friends and Family: Once a week    Attends Religious Services: More than 4 times per year    Active Member of [employer] or Organizations: Yes    Attends Engineer, structural: More than 4 times per year    Marital Status: Married    Tobacco Counseling Counseling given: Not Answered    Clinical Intake:  Pre-visit preparation completed: Yes  Pain : 0-10 Pain Score: 6  Pain Type: Chronic pain Pain Location: Neck Pain Orientation: Posterior Pain Descriptors / Indicators: Aching, Discomfort, Tender Pain Onset: More than a month ago Pain Frequency: Constant Pain Relieving Factors: had spinal cord manipulator inserted  last week  Pain Relieving Factors: had spinal cord manipulator inserted last week  BMI - recorded: 34.2 Nutritional Status: BMI > 30  Obese Nutritional Risks: None Diabetes: Yes CBG done?: No Did pt. bring in CBG monitor from home?: No  How often do you need to have someone help you when you read instructions, pamphlets, or other written materials from your doctor or pharmacy?: 1 - Never  Interpreter Needed?: No  Information entered by :: Kennedy Bucker, LPN   Activities of Daily Living     10/11/2023   10:09 AM 10/10/2023   10:21 AM  In your present state of health, do you have any difficulty performing the following activities:  Hearing? 0 0  Vision? 0 0  Difficulty concentrating or making decisions? 0 0  Walking or climbing stairs? 1 1  Comment KNEE PAIN   Dressing or bathing? 0 0  Doing errands, shopping? 1 1  Preparing Food and eating ? N N  Using the Toilet? N N  In the past six months, have you accidently leaked urine? Y Y  Do you have problems with loss of bowel  control? N N  Managing your Medications? N N  Managing your Finances? N N  Housekeeping or managing your Housekeeping? N N    Patient Care Team: Danelle Berry, PA-C as PCP - General (Family Medicine) Bettey Mare, MD as Referring Physician (Neurosurgery) Lady Gary Darlin Priestly, MD as Consulting Physician (Cardiology) Tendler, Cliffton Asters, MD as Referring Physician Arlis Porta, MD as Referring Physician (Neurosurgery) Leda Min, MD as Referring Physician (Anesthesiology) Nevada Crane, MD as Consulting Physician (Ophthalmology)  Indicate any recent Medical Services you may have received from other than [facility] providers in the past year (date may be approximate).     Assessment:   This is a routine wellness examination for Brittany Hill.  Hearing/Vision screen Hearing Screening - Comments:: NO AIDS Vision Screening - Comments:: READERS- DR.KING   Goals Addressed             This Visit's  Progress    DIET - EAT MORE FRUITS AND VEGETABLES         Depression Screen     10/11/2023   10:05 AM 07/04/2023   11:02 AM 03/12/2023   11:24 AM 02/19/2023   10:49 AM 12/05/2022    2:52 PM 10/05/2022   10:05 AM 09/19/2022   10:43 AM  PHQ 2/9 Scores  PHQ - 2 Score 4 0 2 4 0 2 0  PHQ- 9 Score 4  4 6  0 2 0    Fall Risk     10/11/2023   10:08 AM 10/10/2023   10:21 AM 07/04/2023   11:01 AM 03/12/2023   11:24 AM 02/19/2023   10:49 AM  Fall Risk   Falls in the past year? 1 1 0 0 0  Number falls in past yr: 0 0 0 0 0  Injury with Fall? 0 0 0 0 0  Risk for fall due to :   No Fall Risks No Fall Risks No Fall Risks  Follow up Falls prevention discussed;Falls evaluation completed  Falls prevention discussed;Education provided;Falls evaluation completed Falls prevention discussed;Education provided;Falls evaluation completed Falls prevention discussed;Education provided;Falls evaluation completed    MEDICARE RISK AT HOME:  Medicare Risk at Home Any stairs in or around the home?: Yes If so, are there any without handrails?: No Home free of loose throw rugs in walkways, pet beds, electrical cords, etc?: Yes Adequate lighting in your home to reduce risk of falls?: Yes Life alert?: No Use of a cane, walker or w/c?: No Grab bars in the bathroom?: Yes Shower chair or bench in shower?: Yes Elevated toilet seat or a handicapped toilet?: Yes  TIMED UP AND GO:  Was the test performed?  No  Cognitive Function: 6CIT completed        10/11/2023   10:16 AM 10/05/2022   10:18 AM 08/06/2018   11:52 AM  6CIT Screen  What Year? 0 points 0 points 0 points  What month? 0 points 0 points 0 points  What time? 0 points 0 points 0 points  Count back from 20 0 points 0 points 0 points  Months in reverse 0 points 0 points 0 points  Repeat phrase 2 points 0 points 0 points  Total Score 2 points 0 points 0 points    Immunizations Immunization History  Administered Date(s) Administered   Fluad Quad(high  Dose 65+) 05/02/2019, 05/17/2020, 05/16/2021   Influenza, High Dose Seasonal PF 05/16/2018, 05/07/2021   Influenza,inj,Quad PF,6+ Mos 06/03/2015   Influenza-Unspecified 05/17/2012, 05/10/2017, 05/09/2023   PFIZER(Purple Top)SARS-COV-2 Vaccination 08/26/2019, 09/17/2019, 04/18/2020, 02/01/2021  PNEUMOCOCCAL CONJUGATE-20 08/21/2022   Pfizer Covid-19 Theatre manager 58yrs & up 08/02/2021   Pneumococcal Polysaccharide-23 08/02/2017   Rsv, Bivalent, Protein Subunit Rsvpref,pf Verdis Frederickson) 07/19/2022   Td 06/19/1999   Tdap 03/27/2020   Zoster, Live 08/08/2011    Screening Tests Health Maintenance  Topic Date Due   Zoster Vaccines- Shingrix (1 of 2) 05/09/1970   COVID-19 Vaccine (6 - 2024-25 season) 04/08/2023   Colonoscopy  02/19/2024 (Originally 01/11/2023)   MAMMOGRAM  11/27/2023   HEMOGLOBIN A1C  01/01/2024   Diabetic kidney evaluation - Urine ACR  02/19/2024   FOOT EXAM  02/19/2024   OPHTHALMOLOGY EXAM  05/30/2024   Diabetic kidney evaluation - eGFR measurement  07/03/2024   Medicare Annual Wellness (AWV)  10/10/2024   DEXA SCAN  11/26/2024   DTaP/Tdap/Td (3 - Td or Tdap) 03/27/2030   Pneumonia Vaccine 81+ Years old  Completed   INFLUENZA VACCINE  Completed   Hepatitis C Screening  Addressed   HPV VACCINES  Aged Out    Health Maintenance  Health Maintenance Due  Topic Date Due   Zoster Vaccines- Shingrix (1 of 2) 05/09/1970   COVID-19 Vaccine (6 - 2024-25 season) 04/08/2023   Health Maintenance Items Addressed: [facility] DUE MAMMOGRAM REFERRAL SENT COLONSCOPY UP TO DATE- HAS APPOINTMENT IN MAY 2025  Additional Screening:  Vision Screening: Recommended annual ophthalmology exams for early detection of glaucoma and other disorders of the eye.  Dental Screening: Recommended annual dental exams for proper oral hygiene  Community Resource Referral / Chronic Care Management: CRR required this visit?  No   CCM required this visit?  No     Plan:     I have  personally reviewed and noted the following in the patient's chart:   Medical and social history Use of alcohol, tobacco or illicit drugs  Current medications and supplements including opioid prescriptions. Patient is currently taking opioid prescriptions. Information provided to patient regarding non-opioid alternatives. Patient advised to discuss non-opioid treatment plan with their provider. Functional ability and status Nutritional status Physical activity Advanced directives List of other physicians Hospitalizations, surgeries, and ER visits in previous 12 months Vitals Screenings to include cognitive, depression, and falls Referrals and appointments  In addition, I have reviewed and discussed with patient certain preventive protocols, quality metrics, and best practice recommendations. A written personalized care plan for preventive services as well as general preventive health recommendations were provided to patient.     Hal Hope, LPN   08/11/7827   After Visit Summary: (MyChart) Due to this being a telephonic visit, the after visit summary with patients personalized plan was offered to patient via MyChart   Notes:  MAMMOGRAM REFERRAL SENT

## 2023-10-12 ENCOUNTER — Ambulatory Visit: Admitting: Family Medicine

## 2023-10-12 ENCOUNTER — Encounter: Payer: Self-pay | Admitting: Family Medicine

## 2023-10-12 VITALS — BP 158/76 | HR 92 | Resp 16 | Ht 64.0 in | Wt 189.0 lb

## 2023-10-12 DIAGNOSIS — Z09 Encounter for follow-up examination after completed treatment for conditions other than malignant neoplasm: Secondary | ICD-10-CM

## 2023-10-12 DIAGNOSIS — M5412 Radiculopathy, cervical region: Secondary | ICD-10-CM

## 2023-10-12 DIAGNOSIS — M810 Age-related osteoporosis without current pathological fracture: Secondary | ICD-10-CM

## 2023-10-12 DIAGNOSIS — I10 Essential (primary) hypertension: Secondary | ICD-10-CM

## 2023-10-12 DIAGNOSIS — R631 Polydipsia: Secondary | ICD-10-CM

## 2023-10-12 DIAGNOSIS — R899 Unspecified abnormal finding in specimens from other organs, systems and tissues: Secondary | ICD-10-CM

## 2023-10-12 DIAGNOSIS — G9340 Encephalopathy, unspecified: Secondary | ICD-10-CM | POA: Diagnosis not present

## 2023-10-12 DIAGNOSIS — R7303 Prediabetes: Secondary | ICD-10-CM | POA: Diagnosis not present

## 2023-10-12 NOTE — Progress Notes (Signed)
 Patient ID: Brittany Hill, female    DOB: January 13, 1951, 73 y.o.   MRN: [id number]  PCP: Danelle Berry, PA-C  Chief Complaint  Patient presents with   Hospitalization Follow-up    Subjective:   Brittany Hill is a 73 y.o. female, presents to clinic with CC of the following:  HPI  Admit Date: 10/01/2023 Discharge Date: 10/05/2023  Admitted to [facility] for spinal cord stimulator surgery for cervical radiculopathy complicated by acute encephalopthy   Here for hospital follow up/transition of care. Transition of care was initiated previously by crystal lloyd on 3/3 and med changes, diagnosis, specialist follow ups and pts symptoms and condition were all reviewed.  New medications started per hospitalization -none Labs due today are- none but pt has concerns about calcium Pt has increased pain due to surgery, has surgical f/up soon Hospital Course: - Admission date: 10/01/2023 - On 10/01/2023 6:48 AM Wilnette Kales Florinda Marker was taken to the OR and underwent an Procedure(s) (LRB): CERVICAL SPINAL CORD STIMULATOR IMPLANT (N/A) by Dr. Brayton El.??Surgical consent and time-out was completed appropriately prior to proceeding with surgery. Patient underwent General Anesthesia successfully and was administered the appropriate peri-operative antibiotics. Patient tolerated the procedure well. No intraoperative complications were noted. For a more detailed operative course please refer to operative note. Patient was successfully extubated and transferred to PACU. After meeting appropriate PACU discharge criteria patient was transferred to floor.  Postoperative day one patient had some issues with BP dropping when taking Meds, she attempted to minimize meds which resulted in increased pain. POD2 she became very drowsy, hospitalist consuilted, Pulsox ordered. POD3 still drowsy, medications revised by hospitalist, had issues with O2 Sats dropping while off oxygen, incision benign. POD4  became much more alert but had issues with orthostatic hypotension, likely from deconditioning over the previous day. Later that day she improved and wanted to go home. Patient then discharged.   - Discharge Condition:  On day of discharge Blood pressure 161/72, pulse 75, temperature 98.5 F (36.9 C), temperature source Oral, resp. rate 18, height 1.6 m (5\' 3" ), weight 88 kg (194 lb 0.1 oz), SpO2 93%. noted to be stable. Patient is alert and oriented x 3. NAD. Motor/Neuro/Vascular within normal limit. Incision without erythema/drainage/well approximated. Dressing remains dry. Drains have been discontinued. Patient's pain control has been optimized and is at an acceptable level to the patient. Patient at this time is medically stable and is cleared by Physical therapy to be safely discharged. Instructions have been reviewed with the patient and at this time patient has no further questions.     Pt has concerns with excessive thirst, neuropathy in feet - wants to recheck A1c, worried about diabetes She also has abnormal ionized calcium and severe osteoporosis - reviewed chart and last calcium was 9.8/normal and she's had some past mild calcium elevations over the past 2 years    Lab Results  Component Value Date   HGBA1C 6.1 (H) 07/04/2023      Patient Active Problem List   Diagnosis Date Noted   History of total knee replacement, bilateral 05/19/2022   At moderate risk for fall 05/19/2022   Syncope 01/17/2022   Dyslipidemia associated with type 2 diabetes mellitus (HCC) 07/12/2021   Acquired stenosis of bilateral nasolacrimal duct 02/08/2021   Type 2 diabetes mellitus without complication, without long-term current use of insulin (HCC) 03/22/2020   Acquired trigger finger 01/06/2020   Bouchard's nodes (with arthropathy) 01/06/2020   Chronic post-operative pain 09/05/2019  Neck pain 09/05/2019   Cervical radiculopathy 09/05/2019   Displacement of cervical intervertebral disc  05/14/2019   Fatty liver disease, nonalcoholic 03/28/2019   Osteopenia 10/20/2018   Obesity (BMI 30.0-34.9) 09/27/2018   Cataract, left 04/02/2018   Osteoarthritis of left ankle 02/16/2018   Asthma without status asthmaticus 10/05/2017   Genetic testing 08/28/2017   Family hx-breast malignancy 08/02/2017   Dense breast tissue on mammogram 07/28/2016   Left sided sciatica 12/19/2015   Left lumbar radiculopathy 12/16/2015   Hypomagnesemia 12/06/2015   Hypokalemia 12/02/2015   Fibromyalgia    MS (multiple sclerosis) (HCC)    Chronic constipation    OA (osteoarthritis)    Hypothyroidism    IFG (impaired fasting glucose)    Hypertension    Hyperlipidemia    GERD (gastroesophageal reflux disease)    MDD (major depressive disorder), recurrent, in full remission (HCC)    Anxiety    TMJ syndrome       Current Outpatient Medications:    acetaminophen (TYLENOL) 500 MG tablet, Take 1,000 mg by mouth every 6 (six) hours as needed., Disp: , Rfl:    DULoxetine (CYMBALTA) 20 MG capsule, Take 2 CAPSULES IN THE MORNING AND ONE IN THE EVENING., Disp: 270 capsule, Rfl: 0   EPINEPHrine 0.3 mg/0.3 mL IJ SOAJ injection, Inject 0.3 mg into the muscle once., Disp: , Rfl:    famotidine (PEPCID) 20 MG tablet, Take 1 tablet (20 mg total) by mouth 2 (two) times daily as needed for heartburn or indigestion., Disp: 60 tablet, Rfl: 5   fluticasone (VERAMYST) 27.5 MCG/SPRAY nasal spray, Place 1 spray into the nose daily. Inhale 1 puff (100 mcg total) every morning., Disp: , Rfl:    hydrALAZINE (APRESOLINE) 10 MG tablet, Take 1 tablet (10 mg total) by mouth 2 (two) times daily as needed (take for sustained BP >160/100)., Disp: 30 tablet, Rfl: 1   ibuprofen (ADVIL) 600 MG tablet, Take 600 mg by mouth every 6 (six) hours. Take 1 tablet (600 mg total) by mouth every 6 (six) hours for 5 days., Disp: , Rfl:    Insulin Pen Needle 32G X 4 MM MISC, Use as directed with forteo injection daily, Disp: 90 each, Rfl: 2    ipratropium (ATROVENT HFA) 17 MCG/ACT inhaler, Inhale 1 puff into the lungs every 6 (six) hours.  inhaler  Inhale daily., Disp: , Rfl:    ipratropium (ATROVENT) 0.03 % nasal spray, Place 2 sprays into both nostrils every 12 (twelve) hours., Disp: 30 mL, Rfl: 12   levothyroxine (SYNTHROID) 75 MCG tablet, Take 1 tablet (75 mcg total) by mouth daily before breakfast., Disp: 90 tablet, Rfl: 0   losartan (COZAAR) 25 MG tablet, Take 1 tablet (25 mg total) by mouth daily., Disp: 30 tablet, Rfl: 1   metFORMIN (GLUCOPHAGE-XR) 500 MG 24 hr tablet, Take 1 tablet (500 mg total) by mouth 2 (two) times daily., Disp: 180 tablet, Rfl: 3   metoprolol succinate (TOPROL-XL) 50 MG 24 hr tablet, Take 1 tablet (50 mg total) by mouth once daily, Disp: 90 tablet, Rfl: 1   montelukast (SINGULAIR) 10 MG tablet, Take 1 tablet (10 mg total) by mouth at bedtime., Disp: 90 tablet, Rfl: 1   naloxone (NARCAN) nasal spray 4 mg/0.1 mL, Place into the nose., Disp: , Rfl:    ondansetron (ZOFRAN ODT) 4 MG disintegrating tablet, Take 1 tablet (4 mg total) by mouth every 8 (eight) hours as needed for nausea or vomiting., Disp: 20 tablet, Rfl: 1   oxyCODONE (OXY  IR/ROXICODONE) 5 MG immediate release tablet, Take 5 mg by mouth 2 (two) times daily as needed., Disp: , Rfl:    oxycodone (OXY-IR) 5 MG capsule, Take 5 mg by mouth every 6 (six) hours as needed., Disp: , Rfl:    pantoprazole (PROTONIX) 40 MG tablet, Take 1 tablet (40 mg total) by mouth at bedtime., Disp: 90 tablet, Rfl: 0   pregabalin (LYRICA) 100 MG capsule, Take 1 capsule (100 mg total) by mouth in the morning AND 2 capsules (200 mg total) at bedtime., Disp: 270 capsule, Rfl: 0   rosuvastatin (CRESTOR) 10 MG tablet, Take 10 mg by mouth daily. Take 1 tablet (10 mg total) by mouth nightly., Disp: , Rfl:    spironolactone (ALDACTONE) 25 MG tablet, Take 1 tablet (25 mg total) by mouth daily. With breakfast, Disp: 90 tablet, Rfl: 0   tiZANidine (ZANAFLEX) 4 MG tablet, Take 2 tablets  (8 mg total) by mouth 3 (three) times daily., Disp: 540 tablet, Rfl: 1   XTAMPZA ER 9 MG C12A, Take 1 capsule by mouth daily., Disp: , Rfl:    ezetimibe-simvastatin (VYTORIN) 10-40 MG tablet, Take 1 tablet by mouth at bedtime. (Patient not taking: Reported on 10/08/2023), Disp: 90 tablet, Rfl: 3   Allergies  Allergen Reactions   Shellfish Allergy Rash and Swelling   Codeine Nausea And Vomiting   Sulfa Antibiotics Swelling   Shellfish-Derived Products Hives     Social History   Tobacco Use   Smoking status: Never   Smokeless tobacco: Never  Vaping Use   Vaping status: Never Used  Substance Use Topics   Alcohol use: No   Drug use: No      Chart Review Today: I personally reviewed active problem list, medication list, allergies, family history, social history, health maintenance, notes from last encounter, lab results, imaging with the patient/caregiver today.   Review of Systems  Constitutional: Negative.   HENT: Negative.    Eyes: Negative.   Respiratory: Negative.    Cardiovascular: Negative.   Gastrointestinal: Negative.   Endocrine: Negative.   Genitourinary: Negative.   Musculoskeletal: Negative.   Skin: Negative.   Allergic/Immunologic: Negative.   Neurological: Negative.   Hematological: Negative.   Psychiatric/Behavioral: Negative.    All other systems reviewed and are negative.      Objective:   Vitals:   10/12/23 1411  BP: (!) 164/78  Pulse: 92  Resp: 16  SpO2: 97%  Weight: 189 lb (85.7 kg)  Height: 5\' 4"  (1.626 m)    Body mass index is 32.44 kg/m.  Physical Exam Vitals and nursing note reviewed.  Constitutional:      General: She is not in acute distress.    Appearance: She is well-developed. She is not ill-appearing, toxic-appearing or diaphoretic.  HENT:     Head: Normocephalic and atraumatic.     Nose: Nose normal.  Eyes:     General:        Right eye: No discharge.        Left eye: No discharge.     Conjunctiva/sclera:  Conjunctivae normal.  Neck:     Trachea: Trachea and phonation normal. No tracheal deviation.  Cardiovascular:     Rate and Rhythm: Normal rate and regular rhythm.     Pulses: Normal pulses.     Heart sounds: Normal heart sounds.  Pulmonary:     Effort: Pulmonary effort is normal. No respiratory distress.     Breath sounds: Normal breath sounds. No stridor.  Musculoskeletal:  Cervical back: Rigidity present. Decreased range of motion.  Skin:    General: Skin is warm and dry.     Findings: No rash.  Neurological:     Mental Status: She is alert.     Motor: No abnormal muscle tone.     Coordination: Coordination normal.  Psychiatric:        Mood and Affect: Mood normal.        Behavior: Behavior normal.      Results for orders placed or performed in visit on 07/04/23  Lipid Profile   Collection Time: 07/04/23 11:55 AM  Result Value Ref Range   Cholesterol 163 <200 mg/dL   HDL 49 (L) > OR = 50 mg/dL   Triglycerides 295 (H) <150 mg/dL   LDL Cholesterol (Calc) 85 mg/dL (calc)   Total CHOL/HDL Ratio 3.3 <5.0 (calc)   Non-HDL Cholesterol (Calc) 114 <130 mg/dL (calc)  COMPLETE METABOLIC PANEL WITH GFR   Collection Time: 07/04/23 11:55 AM  Result Value Ref Range   Glucose, Bld 97 65 - 99 mg/dL   BUN 12 7 - 25 mg/dL   Creat 6.21 3.08 - 6.57 mg/dL   eGFR 62 > OR = 60 QI/ONG/2.95M8   BUN/Creatinine Ratio SEE NOTE: 6 - 22 (calc)   Sodium 134 (L) 135 - 146 mmol/L   Potassium 4.8 3.5 - 5.3 mmol/L   Chloride 97 (L) 98 - 110 mmol/L   CO2 29 20 - 32 mmol/L   Calcium 11.1 (H) 8.6 - 10.4 mg/dL   Total Protein 7.6 6.1 - 8.1 g/dL   Albumin 5.1 3.6 - 5.1 g/dL   Globulin 2.5 1.9 - 3.7 g/dL (calc)   AG Ratio 2.0 1.0 - 2.5 (calc)   Total Bilirubin 0.5 0.2 - 1.2 mg/dL   Alkaline phosphatase (APISO) 51 37 - 153 U/L   AST 25 10 - 35 U/L   ALT 21 6 - 29 U/L  CBC w/Diff/Platelet   Collection Time: 07/04/23 11:55 AM  Result Value Ref Range   WBC 7.1 3.8 - 10.8 Thousand/uL   RBC 4.13  3.80 - 5.10 Million/uL   Hemoglobin 12.6 11.7 - 15.5 g/dL   HCT 41.3 24.4 - 01.0 %   MCV 90.8 80.0 - 100.0 fL   MCH 30.5 27.0 - 33.0 pg   MCHC 33.6 32.0 - 36.0 g/dL   RDW 27.2 53.6 - 64.4 %   Platelets 229 140 - 400 Thousand/uL   MPV 11.0 7.5 - 12.5 fL   Neutro Abs 3,941 1,500 - 7,800 cells/uL   Absolute Lymphocytes 2,166 850 - 3,900 cells/uL   Absolute Monocytes 703 200 - 950 cells/uL   Eosinophils Absolute 263 15 - 500 cells/uL   Basophils Absolute 28 0 - 200 cells/uL   Neutrophils Relative % 55.5 %   Total Lymphocyte 30.5 %   Monocytes Relative 9.9 %   Eosinophils Relative 3.7 %   Basophils Relative 0.4 %  HgB A1c   Collection Time: 07/04/23 11:55 AM  Result Value Ref Range   Hgb A1c MFr Bld 6.1 (H) <5.7 % of total Hgb   Mean Plasma Glucose 128 mg/dL   eAG (mmol/L) 7.1 mmol/L  TSH   Collection Time: 07/04/23 11:55 AM  Result Value Ref Range   TSH 2.88 0.40 - 4.50 mIU/L  T4   Collection Time: 07/04/23 11:55 AM  Result Value Ref Range   T4, Total 8.9 5.1 - 11.9 mcg/dL       Assessment & Plan:   1.  Hospital discharge follow-up (Primary) Hospitalization, imaging, labs all reviewed through care everywhere - CBC with Differential/Platelet - COMPLETE METABOLIC PANEL WITH GFR  2. Prediabetes Recheck labs due to pt concerns - COMPLETE METABOLIC PANEL WITH GFR - Hemoglobin A1c  3. Polydipsia  - Hemoglobin A1c  4. Osteoporosis without current pathological fracture, unspecified osteoporosis type Prior tx with forteo per spine surgery request, she has completed 6 months of this and pt reports no improvement per the spine surgeon - I cannot find this in notes through care everywhere - Calcium, ionized  5. Abnormal laboratory test Reviewed labs, only mildly elevated calcium in the past that improved/normalized - Calcium, ionized   6. Primary hypertension - COMPLETE METABOLIC PANEL WITH GFR BP elevated today, she is experiencing postop pain In the past with  additional BP medications she's had syncopal episodes so we tend to allow some HTN, rather than risk hypotension and syncope - which would put her at sig risk for fx She does have hydralazine to add when needed for HTN that is persistant Today will not make any changes given her postop pain Will do close f/up after she is cleared from her surgeon BP Readings from Last 3 Encounters:  10/12/23 (!) 158/76  07/04/23 130/82  03/12/23 (!) 149/83        Danelle Berry, PA-C 10/12/23 2:30 PM

## 2023-10-14 LAB — CBC WITH DIFFERENTIAL/PLATELET
Absolute Lymphocytes: 1833 {cells}/uL (ref 850–3900)
Absolute Monocytes: 632 {cells}/uL (ref 200–950)
Basophils Absolute: 32 {cells}/uL (ref 0–200)
Basophils Relative: 0.4 %
Eosinophils Absolute: 348 {cells}/uL (ref 15–500)
Eosinophils Relative: 4.4 %
HCT: 36.5 % (ref 35.0–45.0)
Hemoglobin: 12 g/dL (ref 11.7–15.5)
MCH: 29.4 pg (ref 27.0–33.0)
MCHC: 32.9 g/dL (ref 32.0–36.0)
MCV: 89.5 fL (ref 80.0–100.0)
MPV: 10.2 fL (ref 7.5–12.5)
Monocytes Relative: 8 %
Neutro Abs: 5056 {cells}/uL (ref 1500–7800)
Neutrophils Relative %: 64 %
Platelets: 325 10*3/uL (ref 140–400)
RBC: 4.08 10*6/uL (ref 3.80–5.10)
RDW: 12.8 % (ref 11.0–15.0)
Total Lymphocyte: 23.2 %
WBC: 7.9 10*3/uL (ref 3.8–10.8)

## 2023-10-14 LAB — CALCIUM, IONIZED: Calcium, Ion: 5.4 mg/dL (ref 4.7–5.5)

## 2023-10-14 LAB — COMPLETE METABOLIC PANEL WITH GFR
AG Ratio: 1.7 (calc) (ref 1.0–2.5)
ALT: 15 U/L (ref 6–29)
AST: 19 U/L (ref 10–35)
Albumin: 4.8 g/dL (ref 3.6–5.1)
Alkaline phosphatase (APISO): 65 U/L (ref 37–153)
BUN: 9 mg/dL (ref 7–25)
CO2: 28 mmol/L (ref 20–32)
Calcium: 10.5 mg/dL — ABNORMAL HIGH (ref 8.6–10.4)
Chloride: 103 mmol/L (ref 98–110)
Creat: 0.82 mg/dL (ref 0.60–1.00)
Globulin: 2.8 g/dL (ref 1.9–3.7)
Glucose, Bld: 113 mg/dL — ABNORMAL HIGH (ref 65–99)
Potassium: 4.3 mmol/L (ref 3.5–5.3)
Sodium: 140 mmol/L (ref 135–146)
Total Bilirubin: 0.3 mg/dL (ref 0.2–1.2)
Total Protein: 7.6 g/dL (ref 6.1–8.1)
eGFR: 76 mL/min/{1.73_m2} (ref >=60)

## 2023-10-14 LAB — HEMOGLOBIN A1C
Hgb A1c MFr Bld: 6.4 %{Hb} — ABNORMAL HIGH (ref <5.7)
Mean Plasma Glucose: 137 mg/dL
eAG (mmol/L): 7.6 mmol/L

## 2023-10-15 ENCOUNTER — Encounter: Payer: Self-pay | Admitting: Family Medicine

## 2023-10-15 ENCOUNTER — Telehealth: Payer: Self-pay | Admitting: Family Medicine

## 2023-10-15 NOTE — Telephone Encounter (Signed)
 Copied from CRM [id number]. Topic: Clinical - Medication Refill >> Oct 15, 2023 12:13 PM Payton Doughty wrote: Most Recent Primary Care Visit:  Provider: Danelle Berry  Department: [facility]  Visit Type: TRANSFER OF CARE  Date: 10/12/2023  Medication: Forteo 600 mcg  Has the patient contacted their pharmacy? Yes [facility] calling for the pt  Is this the correct pharmacy for this prescription? Yes If no, delete pharmacy and type the correct one.  This is the patient's preferred pharmacy:  [facility] - [place], [place] - [address] [place] [address] Phone: [phone] Fax: [phone]   Has the prescription been filled recently? Yes Jennifer at [facility] said filled in Dec by Denny Peon Mecum Is the patient out of the medication? Yes  Has the patient been seen for an appointment in the last year OR does the patient have an upcoming appointment? Yes  Can we respond through MyChart? no  I am unable to locate this med in her chart

## 2023-10-15 NOTE — Telephone Encounter (Signed)
 Medication not on active med list- not prescribed by provider here.

## 2023-10-16 ENCOUNTER — Other Ambulatory Visit: Payer: Self-pay | Admitting: Family Medicine

## 2023-10-16 ENCOUNTER — Ambulatory Visit: Payer: Medicare PPO | Admitting: Physician Assistant

## 2023-10-17 ENCOUNTER — Telehealth: Payer: Self-pay

## 2023-10-17 DIAGNOSIS — M810 Age-related osteoporosis without current pathological fracture: Secondary | ICD-10-CM

## 2023-10-17 DIAGNOSIS — Z5181 Encounter for therapeutic drug level monitoring: Secondary | ICD-10-CM

## 2023-10-17 NOTE — Telephone Encounter (Signed)
 Called [facility] number provided, but the line kept ringing. No answer

## 2023-10-17 NOTE — Telephone Encounter (Unsigned)
 Copied from CRM [id number]. Topic: Clinical - Medication Question >> Oct 17, 2023  1:27 PM Shelah Lewandowsky wrote: Reason for CRM: Asha with [facility] asking if there is a new therapy to replace Teriparatide (FORTEO) 600 MCG/2.4ML SOPN - please call [phone]

## 2023-10-25 ENCOUNTER — Encounter: Payer: Self-pay | Admitting: Family Medicine

## 2023-10-31 ENCOUNTER — Other Ambulatory Visit: Payer: Self-pay | Admitting: Family Medicine

## 2023-10-31 DIAGNOSIS — E782 Mixed hyperlipidemia: Secondary | ICD-10-CM

## 2023-11-01 NOTE — Telephone Encounter (Unsigned)
 Copied from CRM [id number]. Topic: General - Other >> Nov 01, 2023  2:19 PM Turkey B wrote: Reason for CRM: wendy from [facility], called states can't get fax thru to show that pt had a shingles shot done there. Please cb for further assistance

## 2023-11-02 ENCOUNTER — Other Ambulatory Visit: Payer: Self-pay | Admitting: Family Medicine

## 2023-11-02 DIAGNOSIS — G8929 Other chronic pain: Secondary | ICD-10-CM

## 2023-11-02 DIAGNOSIS — G629 Polyneuropathy, unspecified: Secondary | ICD-10-CM

## 2023-11-02 DIAGNOSIS — M797 Fibromyalgia: Secondary | ICD-10-CM

## 2023-11-02 DIAGNOSIS — F3342 Major depressive disorder, recurrent, in full remission: Secondary | ICD-10-CM

## 2023-11-23 ENCOUNTER — Ambulatory Visit: Admitting: Family Medicine

## 2023-11-23 ENCOUNTER — Encounter: Payer: Self-pay | Admitting: Family Medicine

## 2023-11-23 VITALS — BP 122/78 | HR 89 | Temp 97.9°F | Resp 18 | Ht 64.0 in | Wt 188.8 lb

## 2023-11-23 DIAGNOSIS — E782 Mixed hyperlipidemia: Secondary | ICD-10-CM

## 2023-11-23 DIAGNOSIS — G629 Polyneuropathy, unspecified: Secondary | ICD-10-CM

## 2023-11-23 DIAGNOSIS — M5412 Radiculopathy, cervical region: Secondary | ICD-10-CM

## 2023-11-23 DIAGNOSIS — J302 Other seasonal allergic rhinitis: Secondary | ICD-10-CM

## 2023-11-23 DIAGNOSIS — M797 Fibromyalgia: Secondary | ICD-10-CM

## 2023-11-23 DIAGNOSIS — E039 Hypothyroidism, unspecified: Secondary | ICD-10-CM

## 2023-11-23 DIAGNOSIS — G8929 Other chronic pain: Secondary | ICD-10-CM

## 2023-11-23 DIAGNOSIS — J4521 Mild intermittent asthma with (acute) exacerbation: Secondary | ICD-10-CM

## 2023-11-23 DIAGNOSIS — M549 Dorsalgia, unspecified: Secondary | ICD-10-CM | POA: Diagnosis not present

## 2023-11-23 DIAGNOSIS — I10 Essential (primary) hypertension: Secondary | ICD-10-CM

## 2023-11-23 DIAGNOSIS — F3342 Major depressive disorder, recurrent, in full remission: Secondary | ICD-10-CM

## 2023-11-23 DIAGNOSIS — K219 Gastro-esophageal reflux disease without esophagitis: Secondary | ICD-10-CM

## 2023-11-23 MED ORDER — PANTOPRAZOLE SODIUM 40 MG PO TBEC: 40.0000 mg | DELAYED_RELEASE_TABLET | Freq: Every day | ORAL | 0 refills | Status: DC

## 2023-11-23 MED ORDER — ROSUVASTATIN CALCIUM 10 MG PO TABS: 10.0000 mg | ORAL_TABLET | Freq: Every day | ORAL | 2 refills | Status: DC

## 2023-11-23 MED ORDER — PREGABALIN 100 MG PO CAPS: ORAL_CAPSULE | ORAL | 0 refills | Status: DC

## 2023-11-23 MED ORDER — FLUTICASONE FUROATE 27.5 MCG/SPRAY NA SUSP: 1.0000 | Freq: Every day | NASAL | 2 refills | Status: DC

## 2023-11-23 MED ORDER — LEVOTHYROXINE SODIUM 75 MCG PO TABS: 75.0000 ug | ORAL_TABLET | Freq: Every day | ORAL | 0 refills | Status: DC

## 2023-11-23 MED ORDER — MONTELUKAST SODIUM 10 MG PO TABS: 10.0000 mg | ORAL_TABLET | Freq: Every day | ORAL | 1 refills | Status: DC

## 2023-11-23 MED ORDER — DULOXETINE HCL 30 MG PO CPEP: ORAL_CAPSULE | ORAL | 1 refills | Status: DC

## 2023-11-23 MED ORDER — SPIRONOLACTONE 25 MG PO TABS: 25.0000 mg | ORAL_TABLET | Freq: Every day | ORAL | 1 refills | Status: DC

## 2023-11-23 NOTE — Patient Instructions (Signed)
 Call you insurance and see if they cover wegovy  or zepbound for weight management and let me know and then we can arrange an appointment dedicated to that Recommend doing a food diary or counting calories so we have some more information to go off of for what will help you with losing weight.

## 2023-11-23 NOTE — Progress Notes (Signed)
 Name: SALITA SUNDARAM   MRN: [id number]    DOB: 1950/11/10   Date:11/23/2023       Progress Note  Chief Complaint  Patient presents with   Medical Management of Chronic Issues   osteoprosis    Discuss bone density     Subjective:   Brittany Hill is a 73 y.o. female, presents to clinic for routine follow up on chronic conditions  Osteoporosis - pt completed and did not have much improvement with forteo  to her spine per spine specialists and he has referred her to a soft spine specialist and has another recommendation for meds from the specialists  She having BP lability going up and down, she is still using hydralazine  only rarely with high BP with pain and still having a lot of days where BP is low and she is lightheaded with position changes - so she has not tolerated increasing daily HTN mgmt meds -   Other routine f/up as outlined in A&P    Current Outpatient Medications:    acetaminophen (TYLENOL) 500 MG tablet, Take 1,000 mg by mouth every 6 (six) hours as needed., Disp: , Rfl:    DULoxetine  (CYMBALTA ) 20 MG capsule, Take 2 CAPSULES IN THE MORNING AND ONE IN THE EVENING., Disp: 270 capsule, Rfl: 0   EPINEPHrine  0.3 mg/0.3 mL IJ SOAJ injection, Inject 0.3 mg into the muscle once., Disp: , Rfl:    famotidine  (PEPCID ) 20 MG tablet, Take 1 tablet (20 mg total) by mouth 2 (two) times daily as needed for heartburn or indigestion., Disp: 60 tablet, Rfl: 5   fluticasone  (VERAMYST) 27.5 MCG/SPRAY nasal spray, Place 1 spray into the nose daily. Inhale 1 puff (100 mcg total) every morning., Disp: , Rfl:    hydrALAZINE  (APRESOLINE ) 10 MG tablet, Take 1 tablet (10 mg total) by mouth 2 (two) times daily as needed (take for sustained BP >160/100)., Disp: 30 tablet, Rfl: 1   ibuprofen (ADVIL) 600 MG tablet, Take 600 mg by mouth every 6 (six) hours. Take 1 tablet (600 mg total) by mouth every 6 (six) hours for 5 days., Disp: , Rfl:    Insulin  Pen Needle 32G X 4 MM MISC, Use as directed  with forteo  injection daily, Disp: 90 each, Rfl: 2   ipratropium (ATROVENT  HFA) 17 MCG/ACT inhaler, Inhale 1 puff into the lungs every 6 (six) hours.  inhaler  Inhale daily., Disp: , Rfl:    ipratropium (ATROVENT ) 0.03 % nasal spray, Place 2 sprays into both nostrils every 12 (twelve) hours., Disp: 30 mL, Rfl: 12   levothyroxine  (SYNTHROID ) 75 MCG tablet, Take 1 tablet (75 mcg total) by mouth daily before breakfast., Disp: 90 tablet, Rfl: 0   metFORMIN  (GLUCOPHAGE -XR) 500 MG 24 hr tablet, Take 1 tablet (500 mg total) by mouth 2 (two) times daily., Disp: 180 tablet, Rfl: 3   metoprolol  succinate (TOPROL -XL) 50 MG 24 hr tablet, Take 1 tablet (50 mg total) by mouth once daily, Disp: 90 tablet, Rfl: 1   montelukast  (SINGULAIR ) 10 MG tablet, Take 1 tablet (10 mg total) by mouth at bedtime., Disp: 90 tablet, Rfl: 1   naloxone (NARCAN) nasal spray 4 mg/0.1 mL, Place into the nose., Disp: , Rfl:    ondansetron  (ZOFRAN  ODT) 4 MG disintegrating tablet, Take 1 tablet (4 mg total) by mouth every 8 (eight) hours as needed for nausea or vomiting., Disp: 20 tablet, Rfl: 1   oxyCODONE  (OXY IR/ROXICODONE ) 5 MG immediate release tablet, Take 5 mg by mouth 2 (  two) times daily as needed., Disp: , Rfl:    oxycodone  (OXY-IR) 5 MG capsule, Take 5 mg by mouth every 6 (six) hours as needed., Disp: , Rfl:    pantoprazole  (PROTONIX ) 40 MG tablet, Take 1 tablet (40 mg total) by mouth at bedtime., Disp: 90 tablet, Rfl: 0   pregabalin  (LYRICA ) 100 MG capsule, Take 1 capsule (100 mg total) by mouth in the morning AND 2 capsules (200 mg total) at bedtime., Disp: 270 capsule, Rfl: 0   rosuvastatin  (CRESTOR ) 10 MG tablet, Take 10 mg by mouth daily. Take 1 tablet (10 mg total) by mouth nightly., Disp: , Rfl:    spironolactone  (ALDACTONE ) 25 MG tablet, Take 1 tablet (25 mg total) by mouth daily. With breakfast, Disp: 90 tablet, Rfl: 0   tiZANidine  (ZANAFLEX ) 4 MG tablet, Take 2 tablets (8 mg total) by mouth 3 (three) times daily., Disp:  540 tablet, Rfl: 1   XTAMPZA  ER 9 MG C12A, Take 1 capsule by mouth daily., Disp: , Rfl:   Patient Active Problem List   Diagnosis Date Noted   History of total knee replacement, bilateral 05/19/2022   At moderate risk for fall 05/19/2022   Syncope 01/17/2022   Dyslipidemia associated with type 2 diabetes mellitus (HCC) 07/12/2021   Acquired stenosis of bilateral nasolacrimal duct 02/08/2021   Type 2 diabetes mellitus without complication, without long-term current use of insulin  (HCC) 03/22/2020   Acquired trigger finger 01/06/2020   Bouchard's nodes (with arthropathy) 01/06/2020   Chronic post-operative pain 09/05/2019   Neck pain 09/05/2019   Cervical radiculopathy 09/05/2019   Displacement of cervical intervertebral disc 05/14/2019   Fatty liver disease, nonalcoholic 03/28/2019   Osteopenia 10/20/2018   Obesity (BMI 30.0-34.9) 09/27/2018   Cataract, left 04/02/2018   Osteoarthritis of left ankle 02/16/2018   Asthma without status asthmaticus 10/05/2017   Genetic testing 08/28/2017   Family hx-breast malignancy 08/02/2017   Dense breast tissue on mammogram 07/28/2016   Left sided sciatica 12/19/2015   Left lumbar radiculopathy 12/16/2015   Hypomagnesemia 12/06/2015   Hypokalemia 12/02/2015   Fibromyalgia    MS (multiple sclerosis) (HCC)    Chronic constipation    OA (osteoarthritis)    Hypothyroidism    IFG (impaired fasting glucose)    Hypertension    Hyperlipidemia    GERD (gastroesophageal reflux disease)    MDD (major depressive disorder), recurrent, in full remission (HCC)    Anxiety    TMJ syndrome     Past Surgical History:  Procedure Laterality Date   ABDOMINAL HYSTERECTOMY     complete at age 24; HRT for ~25y   BACK SURGERY     CATARACT EXTRACTION W/PHACO Left 09/27/2017   Procedure: CATARACT EXTRACTION PHACO AND INTRAOCULAR LENS PLACEMENT (IOC);  Surgeon: Rosa College, MD;  Location: [facility] ORS;  Service: Ophthalmology;  Laterality: Left;  Lot  #[id number] H US :   00:27.2 AP%:  13.7 CDE:  3.75   CATARACT EXTRACTION W/PHACO Right 10/25/2017   Procedure: CATARACT EXTRACTION PHACO AND INTRAOCULAR LENS PLACEMENT (IOC);  Surgeon: Rosa College, MD;  Location: [facility] ORS;  Service: Ophthalmology;  Laterality: Right;  Lot # [id number] H US :   00:23.9 AP%   :8.7 CDE:   2.10   Cataracts Bilateral    09/27/17 and 10/25/17   CHOLECYSTECTOMY  05/27/2020   COLONOSCOPY WITH ESOPHAGOGASTRODUODENOSCOPY (EGD)  12/2017   [facility]   EYE SURGERY  2022   JOINT REPLACEMENT     KNEE ARTHROSCOPY Right    x  2   lumbarectomy with fusion  12/06/2015   ROTATOR CUFF REPAIR     SPINE SURGERY  2018?   tear duct      surgery   TMJ ARTHROPLASTY     TOTAL KNEE ARTHROPLASTY Bilateral 09/2007    Family History  Problem Relation Age of Onset   Heart disease Mother    Hypertension Mother    Lymphoma Mother 65       deceased 82   Arthritis Mother    Cancer Mother    Varicose Veins Mother    Stroke Father    Heart attack Father    Heart disease Father    Hypertension Father    Alcohol abuse Father    Early death Father    Hypertension Sister    Colon cancer Sister 79   Cancer Sister 56   Miscarriages / Stillbirths Sister    Hypertension Brother    Diabetes Brother    Mental illness Brother    Depression Brother    Lung cancer Brother 8       smoker; currently 50   Cancer Brother    Learning disabilities Brother    Alcohol abuse Son    Breast cancer Maternal Aunt 53       currently 62   Breast cancer Maternal Aunt        dx 71s; mets in 56s   AAA (abdominal aortic aneurysm) Maternal Grandmother    Heart disease Maternal Grandmother    Heart attack Paternal Grandfather    Throat cancer Paternal Uncle        smoker    Social History   Tobacco Use   Smoking status: Never   Smokeless tobacco: Never  Vaping Use   Vaping status: Never Used  Substance Use Topics   Alcohol use: No   Drug use: No     Allergies   Allergen Reactions   Shellfish Allergy Rash and Swelling   Codeine Nausea And Vomiting   Sulfa Antibiotics Swelling   Shellfish-Derived Products Hives    Health Maintenance  Topic Date Due   COVID-19 Vaccine (6 - 2024-25 season) 04/08/2023   Zoster Vaccines- Shingrix  (1 of 2) 01/11/2024 (Originally [birth year]/09/1970)   Colonoscopy  02/19/2024 (Originally 01/11/2023)   MAMMOGRAM  11/27/2023   Diabetic kidney evaluation - Urine ACR  02/19/2024   FOOT EXAM  02/19/2024   INFLUENZA VACCINE  03/07/2024   HEMOGLOBIN A1C  04/13/2024   OPHTHALMOLOGY EXAM  05/30/2024   Medicare Annual Wellness (AWV)  10/10/2024   Diabetic kidney evaluation - eGFR measurement  10/11/2024   DEXA SCAN  11/26/2024   DTaP/Tdap/Td (3 - Td or Tdap) 03/27/2030   Pneumonia Vaccine 42+ Years old  Completed   Hepatitis C Screening  Addressed   HPV VACCINES  Aged Out   Meningococcal B Vaccine  Aged Out    Chart Review Today: I personally reviewed active problem list, medication list, allergies, family history, social history, health maintenance, notes from last encounter, lab results, imaging with the patient/caregiver today.   Review of Systems  Constitutional: Negative.   HENT: Negative.    Eyes: Negative.   Respiratory: Negative.    Cardiovascular: Negative.   Gastrointestinal: Negative.   Endocrine: Negative.   Genitourinary: Negative.   Musculoskeletal: Negative.   Skin: Negative.   Allergic/Immunologic: Negative.   Neurological: Negative.   Hematological: Negative.   Psychiatric/Behavioral: Negative.    All other systems reviewed and are negative.    Objective:   Vitals:  11/23/23 1318  BP: 122/78  Pulse: 89  Resp: 18  Temp: 97.9 F (36.6 C)  SpO2: 97%  Weight: 188 lb 12.8 oz (85.6 kg)  Height: 5\' 4"  (1.626 m)    Body mass index is 32.41 kg/m.  Physical Exam Vitals and nursing note reviewed.  Constitutional:      Appearance: She is well-developed.  HENT:     Head: Normocephalic and  atraumatic.     Nose: Nose normal.  Eyes:     General:        Right eye: No discharge.        Left eye: No discharge.     Conjunctiva/sclera: Conjunctivae normal.  Neck:     Trachea: No tracheal deviation.  Cardiovascular:     Rate and Rhythm: Normal rate and regular rhythm.     Pulses: Normal pulses.     Heart sounds: Normal heart sounds.  Pulmonary:     Effort: Pulmonary effort is normal. No respiratory distress.     Breath sounds: Normal breath sounds. No stridor.  Musculoskeletal:        General: Normal range of motion.     Cervical back: Rigidity present.  Skin:    General: Skin is warm and dry.     Findings: No rash.  Neurological:     Mental Status: She is alert.     Motor: No abnormal muscle tone.     Coordination: Coordination normal.  Psychiatric:        Behavior: Behavior normal.        Results for orders placed or performed in visit on 10/12/23  CBC with Differential/Platelet   Collection Time: 10/12/23  2:50 PM  Result Value Ref Range   WBC 7.9 3.8 - 10.8 Thousand/uL   RBC 4.08 3.80 - 5.10 Million/uL   Hemoglobin 12.0 11.7 - 15.5 g/dL   HCT 16.1 09.6 - 04.5 %   MCV 89.5 80.0 - 100.0 fL   MCH 29.4 27.0 - 33.0 pg   MCHC 32.9 32.0 - 36.0 g/dL   RDW 40.9 81.1 - 91.4 %   Platelets 325 140 - 400 Thousand/uL   MPV 10.2 7.5 - 12.5 fL   Neutro Abs 5,056 1,500 - 7,800 cells/uL   Absolute Lymphocytes 1,833 850 - 3,900 cells/uL   Absolute Monocytes 632 200 - 950 cells/uL   Eosinophils Absolute 348 15 - 500 cells/uL   Basophils Absolute 32 0 - 200 cells/uL   Neutrophils Relative % 64 %   Total Lymphocyte 23.2 %   Monocytes Relative 8.0 %   Eosinophils Relative 4.4 %   Basophils Relative 0.4 %  COMPLETE METABOLIC PANEL WITH GFR   Collection Time: 10/12/23  2:50 PM  Result Value Ref Range   Glucose, Bld 113 (H) 65 - 99 mg/dL   BUN 9 7 - 25 mg/dL   Creat 7.82 9.56 - 2.13 mg/dL   eGFR 76 > OR = 60 YQ/MVH/8.46N6   BUN/Creatinine Ratio SEE NOTE: 6 - 22 (calc)    Sodium 140 135 - 146 mmol/L   Potassium 4.3 3.5 - 5.3 mmol/L   Chloride 103 98 - 110 mmol/L   CO2 28 20 - 32 mmol/L   Calcium  10.5 (H) 8.6 - 10.4 mg/dL   Total Protein 7.6 6.1 - 8.1 g/dL   Albumin 4.8 3.6 - 5.1 g/dL   Globulin 2.8 1.9 - 3.7 g/dL (calc)   AG Ratio 1.7 1.0 - 2.5 (calc)   Total Bilirubin 0.3 0.2 - 1.2 mg/dL  Alkaline phosphatase (APISO) 65 37 - 153 U/L   AST 19 10 - 35 U/L   ALT 15 6 - 29 U/L  Hemoglobin A1c   Collection Time: 10/12/23  2:50 PM  Result Value Ref Range   Hgb A1c MFr Bld 6.4 (H) <5.7 % of total Hgb   Mean Plasma Glucose 137 mg/dL   eAG (mmol/L) 7.6 mmol/L  Calcium , ionized   Collection Time: 10/12/23  2:50 PM  Result Value Ref Range   Calcium , Ion 5.4 4.7 - 5.5 mg/dL      Assessment & Plan:   Primary hypertension Assessment & Plan: Doing well with spironolactone  and rarely needing hydralazine  (usually with big pain flare or she has needed after surgery)  When put on additional meds she has hypotensive episodes at home and she is already a high fall risk and osteoporotic - will avoid hypotension by allowing some permissive elevated BP  BP Readings from Last 3 Encounters:  12/03/23 122/68  11/23/23 122/78  10/12/23 (!) 158/76     Orders: -     Spironolactone ; Take 1 tablet (25 mg total) by mouth daily. With breakfast  Dispense: 90 tablet; Refill: 1  Fibromyalgia Assessment & Plan: Med refills  Orders: -     DULoxetine  HCl; Take 2 CAPSULES IN THE MORNING AND ONE IN THE EVENING.  Dispense: 270 capsule; Refill: 1 -     Pregabalin ; Take 1 capsule (100 mg total) by mouth in the morning AND 2 capsules (200 mg total) at bedtime.  Dispense: 270 capsule; Refill: 0  MDD (major depressive disorder), recurrent, in full remission [facility]) Assessment & Plan:    11/23/2023    1:21 PM 10/11/2023   10:05 AM 07/04/2023   11:02 AM  Depression screen PHQ 2/9  Decreased Interest 2 2 0  Down, Depressed, Hopeless 2 2 0  PHQ - 2 Score 4 4 0  Altered  sleeping 0 0   Tired, decreased energy 0 0   Change in appetite 0 0   Feeling bad or failure about yourself  0 0   Trouble concentrating 0 0   Moving slowly or fidgety/restless 0 0   Suicidal thoughts 0 0   PHQ-9 Score 4 4   Difficult doing work/chores Not difficult at all Not difficult at all   Phq reviewed On cymbalta  for multiple indications, doing fairly well, no change   Orders: -     DULoxetine  HCl; Take 2 CAPSULES IN THE MORNING AND ONE IN THE EVENING.  Dispense: 270 capsule; Refill: 1  Chronic back pain, unspecified back location, unspecified back pain laterality -     DULoxetine  HCl; Take 2 CAPSULES IN THE MORNING AND ONE IN THE EVENING.  Dispense: 270 capsule; Refill: 1  Neuropathy -     DULoxetine  HCl; Take 2 CAPSULES IN THE MORNING AND ONE IN THE EVENING.  Dispense: 270 capsule; Refill: 1  Cervical radiculopathy Assessment & Plan: Per spine  Orders: -     Pregabalin ; Take 1 capsule (100 mg total) by mouth in the morning AND 2 capsules (200 mg total) at bedtime.  Dispense: 270 capsule; Refill: 0  Gastroesophageal reflux disease without esophagitis Assessment & Plan: Symptoms stable encouraged weaning off PPIs or taking breaks when able  Orders: -     Pantoprazole  Sodium; Take 1 tablet (40 mg total) by mouth at bedtime.  Dispense: 90 tablet; Refill: 0  Mild intermittent asthma without status asthmaticus with acute exacerbation Assessment & Plan: Stable, well controlled no recent  exacerbations, controlled med refilled  Orders: -     Montelukast  Sodium; Take 1 tablet (10 mg total) by mouth at bedtime.  Dispense: 90 tablet; Refill: 1  Hypothyroidism, unspecified type Assessment & Plan: Lab Results  Component Value Date   TSH 2.88 07/04/2023  Recent TSH in range, meds refilled at same dose, will repeat annually or if pt has any sx, currently euthyroid    Orders: -     Levothyroxine  Sodium; Take 1 tablet (75 mcg total) by mouth daily before breakfast.   Dispense: 90 tablet; Refill: 0  Mixed hyperlipidemia Assessment & Plan: Lab Results  Component Value Date   CHOL 163 07/04/2023   HDL 49 (L) 07/04/2023   LDLCALC 85 07/04/2023   TRIG 197 (H) 07/04/2023   CHOLHDL 3.3 07/04/2023  Good statin compliance, on crestor  10 daily, no SE or concerns recent labs reviewed refills ordered   Orders: -     Rosuvastatin  Calcium ; Take 1 tablet (10 mg total) by mouth daily. Take 1 tablet (10 mg total) by mouth nightly.  Dispense: 90 tablet; Refill: 2  Seasonal allergic rhinitis, unspecified trigger -     Montelukast  Sodium; Take 1 tablet (10 mg total) by mouth at bedtime.  Dispense: 90 tablet; Refill: 1 -     Fluticasone  Furoate; Place 1 spray into the nose daily. Inhale 1 puff (100 mcg total) every morning.  Dispense: 10 g; Refill: 2     Return in about 6 months (around 05/24/2024) for Routine follow-up.   Adeline Hone, PA-C 11/23/23 1:40 PM

## 2023-11-26 ENCOUNTER — Other Ambulatory Visit: Payer: Self-pay | Admitting: Family Medicine

## 2023-11-26 DIAGNOSIS — E039 Hypothyroidism, unspecified: Secondary | ICD-10-CM

## 2023-11-27 NOTE — Telephone Encounter (Signed)
 Too soon for refill, last refill 11/23/23.  Requested Prescriptions  Pending Prescriptions Disp Refills   levothyroxine  (SYNTHROID ) 75 MCG tablet [Pharmacy Med Name: LEVOTHYROXINE  75 MCG TABLET] 90 tablet 0    Sig: Take 1 tablet (75 mcg total) by mouth daily before breakfast.     Endocrinology:  Hypothyroid Agents Passed - 11/27/2023 10:35 AM      Passed - TSH in normal range and within 360 days    TSH  Date Value Ref Range Status  07/04/2023 2.88 0.40 - 4.50 mIU/L Final         Passed - Valid encounter within last 12 months    Recent Outpatient Visits           4 days ago Primary hypertension   [facility] Adeline Hone, PA-C   1 month ago Hospital discharge follow-up   [facility] Adeline Hone, [place]       Future Appointments             In 5 months Adeline Hone, PA-C [facility]

## 2023-11-28 ENCOUNTER — Ambulatory Visit
Admission: RE | Admit: 2023-11-28 | Discharge: 2023-11-28 | Disposition: A | Discharge status: Home / Self Care | Source: Ambulatory Visit | Attending: Family Medicine | Admitting: Family Medicine

## 2023-11-28 DIAGNOSIS — Z1231 Encounter for screening mammogram for malignant neoplasm of breast: Secondary | ICD-10-CM | POA: Diagnosis present

## 2023-12-03 ENCOUNTER — Ambulatory Visit: Admitting: Family Medicine

## 2023-12-03 ENCOUNTER — Encounter: Payer: Self-pay | Admitting: Family Medicine

## 2023-12-03 VITALS — BP 122/68 | HR 72 | Resp 16 | Ht 64.0 in | Wt 188.0 lb

## 2023-12-03 DIAGNOSIS — E66811 Obesity, class 1: Secondary | ICD-10-CM | POA: Diagnosis not present

## 2023-12-03 DIAGNOSIS — Z7689 Persons encountering health services in other specified circumstances: Secondary | ICD-10-CM

## 2023-12-03 DIAGNOSIS — I10 Essential (primary) hypertension: Secondary | ICD-10-CM | POA: Diagnosis not present

## 2023-12-03 DIAGNOSIS — E039 Hypothyroidism, unspecified: Secondary | ICD-10-CM | POA: Diagnosis not present

## 2023-12-03 DIAGNOSIS — K5909 Other constipation: Secondary | ICD-10-CM

## 2023-12-03 DIAGNOSIS — E782 Mixed hyperlipidemia: Secondary | ICD-10-CM

## 2023-12-03 DIAGNOSIS — R7303 Prediabetes: Secondary | ICD-10-CM | POA: Insufficient documentation

## 2023-12-03 DIAGNOSIS — Z6832 Body mass index (BMI) 32.0-32.9, adult: Secondary | ICD-10-CM

## 2023-12-03 MED ORDER — LINACLOTIDE 72 MCG PO CAPS: 72.0000 ug | ORAL_CAPSULE | Freq: Every day | ORAL | 5 refills | Status: DC

## 2023-12-03 MED ORDER — SEMAGLUTIDE-WEIGHT MANAGEMENT 1 MG/0.5ML ~~LOC~~ SOAJ: 1.0000 mg | SUBCUTANEOUS | 0 refills | Status: DC

## 2023-12-03 MED ORDER — SEMAGLUTIDE-WEIGHT MANAGEMENT 0.5 MG/0.5ML ~~LOC~~ SOAJ: 0.5000 mg | SUBCUTANEOUS | 0 refills | Status: DC

## 2023-12-03 MED ORDER — SEMAGLUTIDE-WEIGHT MANAGEMENT 0.25 MG/0.5ML ~~LOC~~ SOAJ: 0.2500 mg | SUBCUTANEOUS | 0 refills | Status: DC

## 2023-12-03 NOTE — Progress Notes (Signed)
 Name: Brittany Hill   MRN: [id number]    DOB: 1951/07/19   Date:12/03/2023       Progress Note  Chief Complaint  Patient presents with   Weight Loss    Would like Wegovy     Subjective:   Brittany Hill is a 73 y.o. female, presents to clinic for routine follow up on chronic conditions  Last week at routine f/up appt she asked about weight loss meds: Call your insurance and see if they cover wegovy or zepbound for weight management and let me know and then we can arrange an appointment dedicated to that Recommend doing a food diary or counting calories so we have some more information to go off of for what will help you with losing weight.      Here for f/up - she called insurance, she did not write down calories/diet/nutrition No hx of pancreatitis, MEN type 2, MTC prediabetes Lab Results  Component Value Date   HGBA1C 6.4 (H) 10/12/2023  Multiple comorbidities - MS, severe OA, RA, HTN, HLD, prediabetes  She has done years of health/nutrition program [facility] - nutrtitional and faith based food/weight loss plan - she would loose up to 40 lbs at a time but would always gain it back  Wt Readings from Last 5 Encounters:  12/03/23 188 lb (85.3 kg)  11/23/23 188 lb 12.8 oz (85.6 kg)  10/12/23 189 lb (85.7 kg)  07/04/23 199 lb 3.2 oz (90.4 kg)  03/12/23 192 lb 11.2 oz (87.4 kg)   BMI Readings from Last 5 Encounters:  12/03/23 32.27 kg/m  11/23/23 32.41 kg/m  10/12/23 32.44 kg/m  07/04/23 34.19 kg/m  03/12/23 33.08 kg/m   She is currently trying to avoid sugar in foods due to prediabetes, but no other diet efforts        Current Outpatient Medications:    acetaminophen (TYLENOL) 500 MG tablet, Take 1,000 mg by mouth every 6 (six) hours as needed., Disp: , Rfl:    DULoxetine  (CYMBALTA ) 30 MG capsule, Take 2 CAPSULES IN THE MORNING AND ONE IN THE EVENING., Disp: 270 capsule, Rfl: 1   EPINEPHrine  0.3 mg/0.3 mL IJ SOAJ injection, Inject 0.3 mg into the  muscle once., Disp: , Rfl:    famotidine  (PEPCID ) 20 MG tablet, Take 1 tablet (20 mg total) by mouth 2 (two) times daily as needed for heartburn or indigestion., Disp: 60 tablet, Rfl: 5   fluticasone  (VERAMYST) 27.5 MCG/SPRAY nasal spray, Place 1 spray into the nose daily. Inhale 1 puff (100 mcg total) every morning., Disp: 10 g, Rfl: 2   hydrALAZINE  (APRESOLINE ) 10 MG tablet, Take 1 tablet (10 mg total) by mouth 2 (two) times daily as needed (take for sustained BP >160/100)., Disp: 30 tablet, Rfl: 1   ibuprofen (ADVIL) 600 MG tablet, Take 600 mg by mouth every 6 (six) hours. Take 1 tablet (600 mg total) by mouth every 6 (six) hours for 5 days., Disp: , Rfl:    Insulin  Pen Needle 32G X 4 MM MISC, Use as directed with forteo  injection daily, Disp: 90 each, Rfl: 2   ipratropium (ATROVENT  HFA) 17 MCG/ACT inhaler, Inhale 1 puff into the lungs every 6 (six) hours.  inhaler  Inhale daily., Disp: , Rfl:    ipratropium (ATROVENT ) 0.03 % nasal spray, Place 2 sprays into both nostrils every 12 (twelve) hours., Disp: 30 mL, Rfl: 12   levothyroxine  (SYNTHROID ) 75 MCG tablet, Take 1 tablet (75 mcg total) by mouth daily before breakfast.,  Disp: 90 tablet, Rfl: 0   metFORMIN  (GLUCOPHAGE -XR) 500 MG 24 hr tablet, Take 1 tablet (500 mg total) by mouth 2 (two) times daily., Disp: 180 tablet, Rfl: 3   metoprolol  succinate (TOPROL -XL) 50 MG 24 hr tablet, Take 1 tablet (50 mg total) by mouth once daily, Disp: 90 tablet, Rfl: 1   montelukast  (SINGULAIR ) 10 MG tablet, Take 1 tablet (10 mg total) by mouth at bedtime., Disp: 90 tablet, Rfl: 1   naloxone (NARCAN) nasal spray 4 mg/0.1 mL, Place into the nose., Disp: , Rfl:    ondansetron  (ZOFRAN  ODT) 4 MG disintegrating tablet, Take 1 tablet (4 mg total) by mouth every 8 (eight) hours as needed for nausea or vomiting., Disp: 20 tablet, Rfl: 1   oxyCODONE  (OXY IR/ROXICODONE ) 5 MG immediate release tablet, Take 5 mg by mouth 2 (two) times daily as needed., Disp: , Rfl:     oxycodone  (OXY-IR) 5 MG capsule, Take 5 mg by mouth every 6 (six) hours as needed., Disp: , Rfl:    pantoprazole  (PROTONIX ) 40 MG tablet, Take 1 tablet (40 mg total) by mouth at bedtime., Disp: 90 tablet, Rfl: 0   [START ON 12/27/2023] pregabalin  (LYRICA ) 100 MG capsule, Take 1 capsule (100 mg total) by mouth in the morning AND 2 capsules (200 mg total) at bedtime., Disp: 270 capsule, Rfl: 0   rosuvastatin  (CRESTOR ) 10 MG tablet, Take 1 tablet (10 mg total) by mouth daily. Take 1 tablet (10 mg total) by mouth nightly., Disp: 90 tablet, Rfl: 2   spironolactone  (ALDACTONE ) 25 MG tablet, Take 1 tablet (25 mg total) by mouth daily. With breakfast, Disp: 90 tablet, Rfl: 1   tiZANidine  (ZANAFLEX ) 4 MG tablet, Take 2 tablets (8 mg total) by mouth 3 (three) times daily., Disp: 540 tablet, Rfl: 1   XTAMPZA  ER 9 MG C12A, Take 1 capsule by mouth daily., Disp: , Rfl:   Patient Active Problem List   Diagnosis Date Noted   History of total knee replacement, bilateral 05/19/2022   At moderate risk for fall 05/19/2022   Syncope 01/17/2022   Dyslipidemia associated with type 2 diabetes mellitus (HCC) 07/12/2021   Acquired stenosis of bilateral nasolacrimal duct 02/08/2021   Type 2 diabetes mellitus without complication, without long-term current use of insulin  (HCC) 03/22/2020   Acquired trigger finger 01/06/2020   Bouchard's nodes (with arthropathy) 01/06/2020   Chronic post-operative pain 09/05/2019   Neck pain 09/05/2019   Cervical radiculopathy 09/05/2019   Displacement of cervical intervertebral disc 05/14/2019   Fatty liver disease, nonalcoholic 03/28/2019   Osteopenia 10/20/2018   Obesity (BMI 30.0-34.9) 09/27/2018   Cataract, left 04/02/2018   Osteoarthritis of left ankle 02/16/2018   Asthma without status asthmaticus 10/05/2017   Genetic testing 08/28/2017   Family hx-breast malignancy 08/02/2017   Dense breast tissue on mammogram 07/28/2016   Left sided sciatica 12/19/2015   Left lumbar  radiculopathy 12/16/2015   Hypomagnesemia 12/06/2015   Hypokalemia 12/02/2015   Fibromyalgia    MS (multiple sclerosis) (HCC)    Chronic constipation    OA (osteoarthritis)    Hypothyroidism    IFG (impaired fasting glucose)    Hypertension    Hyperlipidemia    GERD (gastroesophageal reflux disease)    MDD (major depressive disorder), recurrent, in full remission (HCC)    Anxiety    TMJ syndrome     Past Surgical History:  Procedure Laterality Date   ABDOMINAL HYSTERECTOMY     complete at age 78; HRT for ~25y  BACK SURGERY     CATARACT EXTRACTION W/PHACO Left 09/27/2017   Procedure: CATARACT EXTRACTION PHACO AND INTRAOCULAR LENS PLACEMENT (IOC);  Surgeon: Rosa College, MD;  Location: [facility] ORS;  Service: Ophthalmology;  Laterality: Left;  Lot #[id number] H US :   00:27.2 AP%:  13.7 CDE:  3.75   CATARACT EXTRACTION W/PHACO Right 10/25/2017   Procedure: CATARACT EXTRACTION PHACO AND INTRAOCULAR LENS PLACEMENT (IOC);  Surgeon: Rosa College, MD;  Location: [facility] ORS;  Service: Ophthalmology;  Laterality: Right;  Lot # [id number] H US :   00:23.9 AP%   :8.7 CDE:   2.10   Cataracts Bilateral    09/27/17 and 10/25/17   CHOLECYSTECTOMY  05/27/2020   COLONOSCOPY WITH ESOPHAGOGASTRODUODENOSCOPY (EGD)  12/2017   [facility]   EYE SURGERY  2022   JOINT REPLACEMENT     KNEE ARTHROSCOPY Right    x 2   lumbarectomy with fusion  12/06/2015   ROTATOR CUFF REPAIR     SPINE SURGERY  2018?   tear duct      surgery   TMJ ARTHROPLASTY     TOTAL KNEE ARTHROPLASTY Bilateral 09/2007    Family History  Problem Relation Age of Onset   Heart disease Mother    Hypertension Mother    Lymphoma Mother 68       deceased 94   Arthritis Mother    Cancer Mother    Varicose Veins Mother    Stroke Father    Heart attack Father    Heart disease Father    Hypertension Father    Alcohol abuse Father    Early death Father    Hypertension Sister    Colon cancer Sister 7   Cancer  Sister 66   Miscarriages / Stillbirths Sister    Hypertension Brother    Diabetes Brother    Mental illness Brother    Depression Brother    Lung cancer Brother 48       smoker; currently 16   Cancer Brother    Learning disabilities Brother    Alcohol abuse Son    Breast cancer Maternal Aunt 48       currently 32   Breast cancer Maternal Aunt        dx 64s; mets in 10s   AAA (abdominal aortic aneurysm) Maternal Grandmother    Heart disease Maternal Grandmother    Heart attack Paternal Grandfather    Throat cancer Paternal Uncle        smoker    Social History   Tobacco Use   Smoking status: Never   Smokeless tobacco: Never  Vaping Use   Vaping status: Never Used  Substance Use Topics   Alcohol use: No   Drug use: No     Allergies  Allergen Reactions   Shellfish Allergy Rash and Swelling   Codeine Nausea And Vomiting   Sulfa Antibiotics Swelling   Shellfish-Derived Products Hives    Health Maintenance  Topic Date Due   COVID-19 Vaccine (6 - 2024-25 season) 04/08/2023   Zoster Vaccines- Shingrix  (1 of 2) 01/11/2024 (Originally 05/09/1970)   Colonoscopy  02/19/2024 (Originally 01/11/2023)   Diabetic kidney evaluation - Urine ACR  02/19/2024   FOOT EXAM  02/19/2024   INFLUENZA VACCINE  03/07/2024   HEMOGLOBIN A1C  04/13/2024   OPHTHALMOLOGY EXAM  05/30/2024   Medicare Annual Wellness (AWV)  10/10/2024   Diabetic kidney evaluation - eGFR measurement  10/11/2024   DEXA SCAN  11/26/2024   MAMMOGRAM  11/27/2024  DTaP/Tdap/Td (3 - Td or Tdap) 03/27/2030   Pneumonia Vaccine 31+ Years old  Completed   Hepatitis C Screening  Addressed   HPV VACCINES  Aged Out   Meningococcal B Vaccine  Aged Out    Chart Review Today: I personally reviewed active problem list, medication list, allergies, family history, social history, health maintenance, notes from last encounter, lab results, imaging with the patient/caregiver today.   Review of Systems  Constitutional:  Negative.   HENT: Negative.    Eyes: Negative.   Respiratory: Negative.    Cardiovascular: Negative.   Gastrointestinal: Negative.   Endocrine: Negative.   Genitourinary: Negative.   Musculoskeletal: Negative.   Skin: Negative.   Allergic/Immunologic: Negative.   Neurological: Negative.   Hematological: Negative.   Psychiatric/Behavioral: Negative.    All other systems reviewed and are negative.    Objective:   Vitals:   12/03/23 1423  BP: 122/68  Pulse: 72  Resp: 16  SpO2: 99%  Weight: 188 lb (85.3 kg)  Height: 5\' 4"  (1.626 m)    Body mass index is 32.27 kg/m.  Physical Exam Vitals and nursing note reviewed.  Constitutional:      Appearance: She is well-developed. She is not ill-appearing, toxic-appearing or diaphoretic.  HENT:     Head: Normocephalic and atraumatic.     Nose: Nose normal.  Eyes:     General:        Right eye: No discharge.        Left eye: No discharge.     Conjunctiva/sclera: Conjunctivae normal.  Neck:     Trachea: No tracheal deviation.  Cardiovascular:     Rate and Rhythm: Normal rate and regular rhythm.  Pulmonary:     Effort: Pulmonary effort is normal. No respiratory distress.     Breath sounds: No stridor.  Musculoskeletal:     Cervical back: Decreased range of motion.  Skin:    General: Skin is warm and dry.     Findings: No rash.  Neurological:     Mental Status: She is alert.     Motor: No abnormal muscle tone.     Coordination: Coordination normal.  Psychiatric:        Behavior: Behavior normal. Behavior is cooperative.      Functional Status Survey:   Results for orders placed or performed in visit on 10/12/23  CBC with Differential/Platelet   Collection Time: 10/12/23  2:50 PM  Result Value Ref Range   WBC 7.9 3.8 - 10.8 Thousand/uL   RBC 4.08 3.80 - 5.10 Million/uL   Hemoglobin 12.0 11.7 - 15.5 g/dL   HCT 11.9 14.7 - 82.9 %   MCV 89.5 80.0 - 100.0 fL   MCH 29.4 27.0 - 33.0 pg   MCHC 32.9 32.0 - 36.0 g/dL    RDW 56.2 13.0 - 86.5 %   Platelets 325 140 - 400 Thousand/uL   MPV 10.2 7.5 - 12.5 fL   Neutro Abs 5,056 1,500 - 7,800 cells/uL   Absolute Lymphocytes 1,833 850 - 3,900 cells/uL   Absolute Monocytes 632 200 - 950 cells/uL   Eosinophils Absolute 348 15 - 500 cells/uL   Basophils Absolute 32 0 - 200 cells/uL   Neutrophils Relative % 64 %   Total Lymphocyte 23.2 %   Monocytes Relative 8.0 %   Eosinophils Relative 4.4 %   Basophils Relative 0.4 %  COMPLETE METABOLIC PANEL WITH GFR   Collection Time: 10/12/23  2:50 PM  Result Value Ref Range   Glucose,  Bld 113 (H) 65 - 99 mg/dL   BUN 9 7 - 25 mg/dL   Creat 1.32 4.40 - 1.02 mg/dL   eGFR 76 > OR = 60 VO/ZDG/6.44I3   BUN/Creatinine Ratio SEE NOTE: 6 - 22 (calc)   Sodium 140 135 - 146 mmol/L   Potassium 4.3 3.5 - 5.3 mmol/L   Chloride 103 98 - 110 mmol/L   CO2 28 20 - 32 mmol/L   Calcium  10.5 (H) 8.6 - 10.4 mg/dL   Total Protein 7.6 6.1 - 8.1 g/dL   Albumin 4.8 3.6 - 5.1 g/dL   Globulin 2.8 1.9 - 3.7 g/dL (calc)   AG Ratio 1.7 1.0 - 2.5 (calc)   Total Bilirubin 0.3 0.2 - 1.2 mg/dL   Alkaline phosphatase (APISO) 65 37 - 153 U/L   AST 19 10 - 35 U/L   ALT 15 6 - 29 U/L  Hemoglobin A1c   Collection Time: 10/12/23  2:50 PM  Result Value Ref Range   Hgb A1c MFr Bld 6.4 (H) <5.7 % of total Hgb   Mean Plasma Glucose 137 mg/dL   eAG (mmol/L) 7.6 mmol/L  Calcium , ionized   Collection Time: 10/12/23  2:50 PM  Result Value Ref Range   Calcium , Ion 5.4 4.7 - 5.5 mg/dL      Assessment & Plan:   Class 1 obesity with serious comorbidity and body mass index (BMI) of 32.0 to 32.9 in adult, unspecified obesity type Assessment & Plan: Reviewed weights, past diet efforts, currently watching sweets/sugar intake but not net calorie intake daily - encouraged her to do some food journals to see intake - for age/sex/activity level BMR roughly 1600/d, increase fruits/veggies and lean proteins Will try to get wegovy approved Multiple comorbidities  including RA, OA, prediagbetes, HTN, HLD, hypothyroid Tried and failed other diet plans in the past - see HPI Wt Readings from Last 5 Encounters:  12/03/23 188 lb (85.3 kg)  11/23/23 188 lb 12.8 oz (85.6 kg)  10/12/23 189 lb (85.7 kg)  07/04/23 199 lb 3.2 oz (90.4 kg)  03/12/23 192 lb 11.2 oz (87.4 kg)   BMI Readings from Last 5 Encounters:  12/03/23 32.27 kg/m  11/23/23 32.41 kg/m  10/12/23 32.44 kg/m  07/04/23 34.19 kg/m  03/12/23 33.08 kg/m   Comorbidities: Hypothyroidism, unspecified type  Primary hypertension Mixed hyperlipidemia Prediabetes   Orders: -     Semaglutide-Weight Management; Inject 0.25 mg into the skin once a week for 28 days.  Dispense: 2 mL; Refill: 0 -     Semaglutide-Weight Management; Inject 0.5 mg into the skin once a week for 28 days.  Dispense: 2 mL; Refill: 0 -     Semaglutide-Weight Management; Inject 1 mg into the skin once a week for 28 days.  Dispense: 2 mL; Refill: 0  Encounter for weight management - see above and HPI -     Semaglutide-Weight Management; Inject 0.25 mg into the skin once a week for 28 days.  Dispense: 2 mL; Refill: 0 -     Semaglutide-Weight Management; Inject 0.5 mg into the skin once a week for 28 days.  Dispense: 2 mL; Refill: 0 -     Semaglutide-Weight Management; Inject 1 mg into the skin once a week for 28 days.  Dispense: 2 mL; Refill: 0  Chronic constipation - if she gets wegovy she is concerned constipation may get worst Assessment & Plan: She's tried everything, an issue since her 40's, stool softeners OTC gummies, tried miralax when she gets backed  up [employer] - helps some Recommended miralax daily, good fiber and fluid intake in diet, trial of linzess prior to starting wegovy if approved  Orders: -     linaCLOtide; Take 1 capsule (72 mcg total) by mouth daily before breakfast.  Dispense: 30 capsule; Refill: 5   If wegovy approved and started she need f/up in 8-12 weeks to monitor dosing/se and do  refills  Adeline Hone, PA-C 12/03/23 2:34 PM

## 2023-12-03 NOTE — Assessment & Plan Note (Signed)
 Reviewed weights, past diet efforts, currently watching sweets/sugar intake but not net calorie intake daily - encouraged her to do some food journals to see intake - for age/sex/activity level BMR roughly 1600/d, increase fruits/veggies and lean proteins Will try to get wegovy approved Multiple comorbidities including RA, OA, prediagbetes, HTN, HLD, hypothyroid Tried and failed other diet plans in the past - see HPI Wt Readings from Last 5 Encounters:  12/03/23 188 lb (85.3 kg)  11/23/23 188 lb 12.8 oz (85.6 kg)  10/12/23 189 lb (85.7 kg)  07/04/23 199 lb 3.2 oz (90.4 kg)  03/12/23 192 lb 11.2 oz (87.4 kg)   BMI Readings from Last 5 Encounters:  12/03/23 32.27 kg/m  11/23/23 32.41 kg/m  10/12/23 32.44 kg/m  07/04/23 34.19 kg/m  03/12/23 33.08 kg/m

## 2023-12-03 NOTE — Assessment & Plan Note (Signed)
 She's tried everything, an issue since her 26's, stool softeners OTC gummies, tried miralax when she gets backed up [employer] - helps some

## 2023-12-05 ENCOUNTER — Telehealth: Payer: Self-pay

## 2023-12-05 NOTE — Telephone Encounter (Signed)
 Prior auth on   Semaglutide-Weight Management 0.25 MG/0.5ML [facility]   Key [id number]

## 2023-12-13 ENCOUNTER — Encounter: Payer: Self-pay | Admitting: Family Medicine

## 2023-12-13 NOTE — Assessment & Plan Note (Signed)
 Lab Results  Component Value Date   CHOL 163 07/04/2023   HDL 49 (L) 07/04/2023   LDLCALC 85 07/04/2023   TRIG 197 (H) 07/04/2023   CHOLHDL 3.3 07/04/2023  Good statin compliance, on crestor  10 daily, no SE or concerns recent labs reviewed refills ordered

## 2023-12-13 NOTE — Assessment & Plan Note (Signed)
 Per spine

## 2023-12-13 NOTE — Assessment & Plan Note (Signed)
 Doing well with spironolactone  and rarely needing hydralazine  (usually with big pain flare or she has needed after surgery)  When put on additional meds she has hypotensive episodes at home and she is already a high fall risk and osteoporotic - will avoid hypotension by allowing some permissive elevated BP  BP Readings from Last 3 Encounters:  12/03/23 122/68  11/23/23 122/78  10/12/23 (!) 158/76

## 2023-12-13 NOTE — Assessment & Plan Note (Signed)
 Med refills

## 2023-12-13 NOTE — Assessment & Plan Note (Signed)
 Symptoms stable encouraged weaning off PPIs or taking breaks when able

## 2023-12-13 NOTE — Assessment & Plan Note (Signed)
 Stable, well controlled no recent exacerbations, controlled med refilled

## 2023-12-13 NOTE — Assessment & Plan Note (Signed)
 Lab Results  Component Value Date   TSH 2.88 07/04/2023  Recent TSH in range, meds refilled at same dose, will repeat annually or if pt has any sx, currently euthyroid

## 2023-12-13 NOTE — Assessment & Plan Note (Signed)
    11/23/2023    1:21 PM 10/11/2023   10:05 AM 07/04/2023   11:02 AM  Depression screen PHQ 2/9  Decreased Interest 2 2 0  Down, Depressed, Hopeless 2 2 0  PHQ - 2 Score 4 4 0  Altered sleeping 0 0   Tired, decreased energy 0 0   Change in appetite 0 0   Feeling bad or failure about yourself  0 0   Trouble concentrating 0 0   Moving slowly or fidgety/restless 0 0   Suicidal thoughts 0 0   PHQ-9 Score 4 4   Difficult doing work/chores Not difficult at all Not difficult at all   Phq reviewed On cymbalta  for multiple indications, doing fairly well, no change

## 2023-12-24 ENCOUNTER — Ambulatory Visit: Admitting: Family Medicine

## 2023-12-24 ENCOUNTER — Ambulatory Visit
Admission: RE | Admit: 2023-12-24 | Discharge: 2023-12-24 | Disposition: A | Discharge status: Home / Self Care | Attending: Family Medicine | Admitting: Family Medicine

## 2023-12-24 ENCOUNTER — Ambulatory Visit
Admission: RE | Admit: 2023-12-24 | Discharge: 2023-12-24 | Disposition: A | Discharge status: Home / Self Care | Source: Ambulatory Visit | Attending: Family Medicine | Admitting: Family Medicine

## 2023-12-24 ENCOUNTER — Encounter: Payer: Self-pay | Admitting: Family Medicine

## 2023-12-24 VITALS — BP 122/74 | HR 87 | Resp 16 | Ht 64.0 in | Wt 186.0 lb

## 2023-12-24 DIAGNOSIS — M542 Cervicalgia: Secondary | ICD-10-CM

## 2023-12-24 DIAGNOSIS — S0990XA Unspecified injury of head, initial encounter: Secondary | ICD-10-CM

## 2023-12-24 DIAGNOSIS — M25561 Pain in right knee: Secondary | ICD-10-CM

## 2023-12-24 DIAGNOSIS — M150 Primary generalized (osteo)arthritis: Secondary | ICD-10-CM

## 2023-12-24 DIAGNOSIS — I340 Nonrheumatic mitral (valve) insufficiency: Secondary | ICD-10-CM | POA: Diagnosis not present

## 2023-12-24 DIAGNOSIS — R29898 Other symptoms and signs involving the musculoskeletal system: Secondary | ICD-10-CM

## 2023-12-24 DIAGNOSIS — G35 Multiple sclerosis: Secondary | ICD-10-CM | POA: Diagnosis not present

## 2023-12-24 DIAGNOSIS — M5416 Radiculopathy, lumbar region: Secondary | ICD-10-CM | POA: Diagnosis not present

## 2023-12-24 DIAGNOSIS — E66811 Obesity, class 1: Secondary | ICD-10-CM

## 2023-12-24 DIAGNOSIS — M5412 Radiculopathy, cervical region: Secondary | ICD-10-CM | POA: Diagnosis not present

## 2023-12-24 DIAGNOSIS — Z96653 Presence of artificial knee joint, bilateral: Secondary | ICD-10-CM

## 2023-12-24 DIAGNOSIS — I071 Rheumatic tricuspid insufficiency: Secondary | ICD-10-CM

## 2023-12-24 DIAGNOSIS — R296 Repeated falls: Secondary | ICD-10-CM | POA: Diagnosis not present

## 2023-12-24 DIAGNOSIS — Z6832 Body mass index (BMI) 32.0-32.9, adult: Secondary | ICD-10-CM

## 2023-12-24 NOTE — Progress Notes (Signed)
 Patient ID: RENIA MIKELSON, female    DOB: 1951-06-03, 73 y.o.   MRN: [id number]  PCP: Adeline Hone, PA-C  Chief Complaint  Patient presents with   Weight Loss    Medicare plan does not cover weight loss medication    Subjective:   HARRIETT AZAR is a 73 y.o. female, presents to clinic with CC of the following:  HPI  Here for falls and wegovy   May 6th pt had a fall fell off chair sprained left shoulder, broke a rib and bumped her head  - she did UC visit 2 days later  May 16 th fall slipped and feet went out from under her, no water or rug, impact to buttock with bent legs and   Prior MS care was in her 40's she would go into hospital and get high dose steroids, no other med/tx for MS   Wegovy  denied - she reports cardiac hx of leaky valves    Patient Active Problem List   Diagnosis Date Noted   Prediabetes 12/03/2023   History of total knee replacement, bilateral 05/19/2022   At moderate risk for fall 05/19/2022   Syncope 01/17/2022   Dyslipidemia associated with type 2 diabetes mellitus (HCC) 07/12/2021   Acquired stenosis of bilateral nasolacrimal duct 02/08/2021   Type 2 diabetes mellitus without complication, without long-term current use of insulin  (HCC) 03/22/2020   Acquired trigger finger 01/06/2020   Bouchard's nodes (with arthropathy) 01/06/2020   Chronic post-operative pain 09/05/2019   Neck pain 09/05/2019   Cervical radiculopathy 09/05/2019   Displacement of cervical intervertebral disc 05/14/2019   Fatty liver disease, nonalcoholic 03/28/2019   Osteopenia 10/20/2018   Class 1 obesity with serious comorbidity and body mass index (BMI) of 32.0 to 32.9 in adult 09/27/2018   Cataract, left 04/02/2018   Osteoarthritis of left ankle 02/16/2018   Asthma without status asthmaticus 10/05/2017   Genetic testing 08/28/2017   Family hx-breast malignancy 08/02/2017   Dense breast tissue on mammogram 07/28/2016   Left sided sciatica 12/19/2015   Left lumbar  radiculopathy 12/16/2015   Hypomagnesemia 12/06/2015   Hypokalemia 12/02/2015   Fibromyalgia    MS (multiple sclerosis) (HCC)    Chronic constipation    OA (osteoarthritis)    Hypothyroidism    IFG (impaired fasting glucose)    Hypertension    Hyperlipidemia    GERD (gastroesophageal reflux disease)    MDD (major depressive disorder), recurrent, in full remission (HCC)    Anxiety    TMJ syndrome       Current Outpatient Medications:    acetaminophen (TYLENOL) 500 MG tablet, Take 1,000 mg by mouth every 6 (six) hours as needed., Disp: , Rfl:    amLODipine  (NORVASC ) 5 MG tablet, Take 5 mg by mouth daily., Disp: , Rfl:    DULoxetine  (CYMBALTA ) 30 MG capsule, Take 2 CAPSULES IN THE MORNING AND ONE IN THE EVENING., Disp: 270 capsule, Rfl: 1   EPINEPHrine  0.3 mg/0.3 mL IJ SOAJ injection, Inject 0.3 mg into the muscle once., Disp: , Rfl:    famotidine  (PEPCID ) 20 MG tablet, Take 1 tablet (20 mg total) by mouth 2 (two) times daily as needed for heartburn or indigestion., Disp: 60 tablet, Rfl: 5   fluticasone  (VERAMYST) 27.5 MCG/SPRAY nasal spray, Place 1 spray into the nose daily. Inhale 1 puff (100 mcg total) every morning., Disp: 10 g, Rfl: 2   hydrALAZINE  (APRESOLINE ) 10 MG tablet, Take 1 tablet (10 mg total) by mouth 2 (two) times  daily as needed (take for sustained BP >160/100)., Disp: 30 tablet, Rfl: 1   ibuprofen (ADVIL) 600 MG tablet, Take 600 mg by mouth every 6 (six) hours. Take 1 tablet (600 mg total) by mouth every 6 (six) hours for 5 days., Disp: , Rfl:    ipratropium (ATROVENT  HFA) 17 MCG/ACT inhaler, Inhale 1 puff into the lungs every 6 (six) hours.  inhaler  Inhale daily., Disp: , Rfl:    ipratropium (ATROVENT ) 0.03 % nasal spray, Place 2 sprays into both nostrils every 12 (twelve) hours., Disp: 30 mL, Rfl: 12   levothyroxine  (SYNTHROID ) 75 MCG tablet, Take 1 tablet (75 mcg total) by mouth daily before breakfast., Disp: 90 tablet, Rfl: 0   linaclotide  (LINZESS ) 72 MCG capsule,  Take 1 capsule (72 mcg total) by mouth daily before breakfast., Disp: 30 capsule, Rfl: 5   metFORMIN  (GLUCOPHAGE -XR) 500 MG 24 hr tablet, Take 1 tablet (500 mg total) by mouth 2 (two) times daily., Disp: 180 tablet, Rfl: 3   metoprolol  succinate (TOPROL -XL) 50 MG 24 hr tablet, Take 1 tablet (50 mg total) by mouth once daily, Disp: 90 tablet, Rfl: 1   montelukast  (SINGULAIR ) 10 MG tablet, Take 1 tablet (10 mg total) by mouth at bedtime., Disp: 90 tablet, Rfl: 1   naloxone (NARCAN) nasal spray 4 mg/0.1 mL, Place into the nose., Disp: , Rfl:    ondansetron  (ZOFRAN  ODT) 4 MG disintegrating tablet, Take 1 tablet (4 mg total) by mouth every 8 (eight) hours as needed for nausea or vomiting., Disp: 20 tablet, Rfl: 1   oxyCODONE  (OXY IR/ROXICODONE ) 5 MG immediate release tablet, Take 5 mg by mouth 2 (two) times daily as needed., Disp: , Rfl:    oxycodone  (OXY-IR) 5 MG capsule, Take 5 mg by mouth every 6 (six) hours as needed., Disp: , Rfl:    pantoprazole  (PROTONIX ) 40 MG tablet, Take 1 tablet (40 mg total) by mouth at bedtime., Disp: 90 tablet, Rfl: 0   [START ON 12/27/2023] pregabalin  (LYRICA ) 100 MG capsule, Take 1 capsule (100 mg total) by mouth in the morning AND 2 capsules (200 mg total) at bedtime., Disp: 270 capsule, Rfl: 0   rosuvastatin  (CRESTOR ) 10 MG tablet, Take 1 tablet (10 mg total) by mouth daily. Take 1 tablet (10 mg total) by mouth nightly., Disp: 90 tablet, Rfl: 2   spironolactone  (ALDACTONE ) 25 MG tablet, Take 1 tablet (25 mg total) by mouth daily. With breakfast, Disp: 90 tablet, Rfl: 1   tiZANidine  (ZANAFLEX ) 4 MG tablet, Take 2 tablets (8 mg total) by mouth 3 (three) times daily., Disp: 540 tablet, Rfl: 1   XTAMPZA  ER 9 MG C12A, Take 1 capsule by mouth daily., Disp: , Rfl:    Semaglutide -Weight Management 0.25 MG/0.5ML SOAJ, Inject 0.25 mg into the skin once a week for 28 days. (Patient not taking: Reported on 12/24/2023), Disp: 2 mL, Rfl: 0   [START ON 01/01/2024] Semaglutide -Weight  Management 0.5 MG/0.5ML SOAJ, Inject 0.5 mg into the skin once a week for 28 days. (Patient not taking: Reported on 12/24/2023), Disp: 2 mL, Rfl: 0   [START ON 01/30/2024] Semaglutide -Weight Management 1 MG/0.5ML SOAJ, Inject 1 mg into the skin once a week for 28 days. (Patient not taking: Reported on 12/24/2023), Disp: 2 mL, Rfl: 0   Allergies  Allergen Reactions   Shellfish Allergy Rash and Swelling   Codeine Nausea And Vomiting   Sulfa Antibiotics Swelling   Shellfish-Derived Products Hives     Social History   Tobacco Use  Smoking status: Never   Smokeless tobacco: Never  Vaping Use   Vaping status: Never Used  Substance Use Topics   Alcohol use: No   Drug use: No      Chart Review Today: I personally reviewed active problem list, medication list, allergies, family history, social history, health maintenance, notes from last encounter, lab results, imaging with the patient/caregiver today.   Review of Systems  Constitutional: Negative.   HENT: Negative.    Eyes: Negative.   Respiratory: Negative.    Cardiovascular: Negative.   Gastrointestinal: Negative.   Endocrine: Negative.   Genitourinary: Negative.   Musculoskeletal: Negative.   Skin: Negative.   Allergic/Immunologic: Negative.   Neurological: Negative.   Hematological: Negative.   Psychiatric/Behavioral: Negative.    All other systems reviewed and are negative.      Objective:   Vitals:   12/24/23 1359  BP: 122/74  Pulse: 87  Resp: 16  SpO2: 95%  Weight: 186 lb (84.4 kg)  Height: 5\' 4"  (1.626 m)    Body mass index is 31.93 kg/m.  Physical Exam Vitals and nursing note reviewed.  Constitutional:      General: She is not in acute distress.    Appearance: Normal appearance. She is well-developed. She is not ill-appearing, toxic-appearing or diaphoretic.  HENT:     Head: Normocephalic and atraumatic. No raccoon eyes, Battle's sign or left periorbital erythema.     Jaw: There is normal jaw  occlusion.     Comments: Mildly tender bump to back right of scalp, no brusiing    Right Ear: External ear normal.     Left Ear: External ear normal.     Nose: Nose normal.  Eyes:     General:        Right eye: No discharge.        Left eye: No discharge.     Conjunctiva/sclera: Conjunctivae normal.  Neck:     Trachea: No tracheal deviation.  Cardiovascular:     Rate and Rhythm: Normal rate and regular rhythm.  Pulmonary:     Effort: Pulmonary effort is normal. No respiratory distress.     Breath sounds: No stridor.  Musculoskeletal:     Cervical back: Rigidity (baseline) present.     Right knee: No swelling or bony tenderness. Tenderness (to back of knee, upper calf) present.  Skin:    General: Skin is warm and dry.     Findings: No rash.  Neurological:     Mental Status: She is alert.     Motor: No abnormal muscle tone.     Coordination: Coordination normal.  Psychiatric:        Behavior: Behavior normal. Behavior is cooperative.      Results for orders placed or performed in visit on 10/12/23  CBC with Differential/Platelet   Collection Time: 10/12/23  2:50 PM  Result Value Ref Range   WBC 7.9 3.8 - 10.8 Thousand/uL   RBC 4.08 3.80 - 5.10 Million/uL   Hemoglobin 12.0 11.7 - 15.5 g/dL   HCT 16.1 09.6 - 04.5 %   MCV 89.5 80.0 - 100.0 fL   MCH 29.4 27.0 - 33.0 pg   MCHC 32.9 32.0 - 36.0 g/dL   RDW 40.9 81.1 - 91.4 %   Platelets 325 140 - 400 Thousand/uL   MPV 10.2 7.5 - 12.5 fL   Neutro Abs 5,056 1,500 - 7,800 cells/uL   Absolute Lymphocytes 1,833 850 - 3,900 cells/uL   Absolute Monocytes 632 200 - 950  cells/uL   Eosinophils Absolute 348 15 - 500 cells/uL   Basophils Absolute 32 0 - 200 cells/uL   Neutrophils Relative % 64 %   Total Lymphocyte 23.2 %   Monocytes Relative 8.0 %   Eosinophils Relative 4.4 %   Basophils Relative 0.4 %  COMPLETE METABOLIC PANEL WITH GFR   Collection Time: 10/12/23  2:50 PM  Result Value Ref Range   Glucose, Bld 113 (H) 65 - 99  mg/dL   BUN 9 7 - 25 mg/dL   Creat 1.61 0.96 - 0.45 mg/dL   eGFR 76 > OR = 60 WU/JWJ/1.91Y7   BUN/Creatinine Ratio SEE NOTE: 6 - 22 (calc)   Sodium 140 135 - 146 mmol/L   Potassium 4.3 3.5 - 5.3 mmol/L   Chloride 103 98 - 110 mmol/L   CO2 28 20 - 32 mmol/L   Calcium  10.5 (H) 8.6 - 10.4 mg/dL   Total Protein 7.6 6.1 - 8.1 g/dL   Albumin 4.8 3.6 - 5.1 g/dL   Globulin 2.8 1.9 - 3.7 g/dL (calc)   AG Ratio 1.7 1.0 - 2.5 (calc)   Total Bilirubin 0.3 0.2 - 1.2 mg/dL   Alkaline phosphatase (APISO) 65 37 - 153 U/L   AST 19 10 - 35 U/L   ALT 15 6 - 29 U/L  Hemoglobin A1c   Collection Time: 10/12/23  2:50 PM  Result Value Ref Range   Hgb A1c MFr Bld 6.4 (H) <5.7 % of total Hgb   Mean Plasma Glucose 137 mg/dL   eAG (mmol/L) 7.6 mmol/L  Calcium , ionized   Collection Time: 10/12/23  2:50 PM  Result Value Ref Range   Calcium , Ion 5.4 4.7 - 5.5 mg/dL       Assessment & Plan:   1. Falls (Primary) She reports multiple falls, unclear if from LE weakness from low back issues or could be other etiology - encouraged f/up with spine and neurology  - Ambulatory referral to Neurology  2. Weakness of lower extremity, unspecified laterality unclear if due to low back/radiculopathy - needs to f/up spine specialists - high risk for fx due to osteoporosis, multiple sugeries   3. MS (multiple sclerosis) (HCC) Not on treatment, recommended neuro f/up and consult with sx to ensure its not related to MS - to recent neuro OV or imaging - Ambulatory referral to Neurology  4. Left lumbar radiculopathy - Ambulatory referral to Neurology  5. Cervical radiculopathy Neck pain worse than her recent baseline with surgeries, stimulator etc advised f/up with managing specialists - Ambulatory referral to Neurology  6. Acute pain of right knee S/p TK no anterior ttp, deformity or swelling, but some tenderness to upper calf and popliteal fossa w/o swelling - DG Knee Complete 4 Views Right  7. History of total  knee arthroplasty, bilateral See #6 - DG Knee Complete 4 Views Right  8. Neck pain See #5  9. Closed head injury, initial encounter X 2 in the past 2 weeks, she did not seek eval but has scalp contusion to right posterior scalp, no bruising or signs of TBI  10. Mitral valve insufficiency, unspecified etiology She wants to know if cardiac hx will get wegovy  covered - reviewed chart - ECHO showed moderate MR TR w/o HF - will try to add to chart and reorder but I do not expect this to cover wegovy  - explained it is indicated for heart disease/hx of MI or stroke  11. Tricuspid valve insufficiency, unspecified etiology See #10  She also  asked for rx for compounded GLP or weight meds and I explained they are not FDA approved and I could not rx for her    Wegovy  Rx again: - Semaglutide -Weight Management 0.25 MG/0.5ML SOAJ; Inject 0.25 mg into the skin once a week for 28 days.  Dispense: 2 mL; Refill: 0 - Semaglutide -Weight Management 0.5 MG/0.5ML SOAJ; Inject 0.5 mg into the skin once a week for 28 days.  Dispense: 2 mL; Refill: 0 - Semaglutide -Weight Management 1 MG/0.5ML SOAJ; Inject 1 mg into the skin once a week for 28 days.  Dispense: 2 mL; Refill: 0 10. Mitral valve insufficiency, unspecified etiology 11. Tricuspid valve insufficiency, unspecified etiology 12. Primary osteoarthritis involving multiple joints 13. Class 1 obesity with serious comorbidity and body mass index (BMI) of 32.0 to 32.9 in adult, unspecified obesity type       Adeline Hone, PA-C 12/24/23 2:13 PM

## 2023-12-24 NOTE — Patient Instructions (Signed)
 Keep next routine follow up appointment  I will look through cardiac history and see if there are diagnosis that I can try and prescribe wegovy  under and I will let you know  I recommend going across the street right now for a knee xray and also calling both your neck and low back teams to let them know about your falls and your symptoms.  I think it would be best evaluated and imaged by them with you complicated history.  I put in a referral to get you back established with neurology at [facility] -  you can call their office to expedite a follow up office visit with your history of MS.

## 2023-12-25 ENCOUNTER — Ambulatory Visit: Payer: Self-pay | Admitting: Family Medicine

## 2023-12-27 ENCOUNTER — Other Ambulatory Visit: Payer: Self-pay | Admitting: Family Medicine

## 2023-12-27 ENCOUNTER — Other Ambulatory Visit: Payer: Self-pay | Admitting: Neurosurgery

## 2023-12-27 DIAGNOSIS — M25512 Pain in left shoulder: Secondary | ICD-10-CM

## 2023-12-27 DIAGNOSIS — I340 Nonrheumatic mitral (valve) insufficiency: Secondary | ICD-10-CM | POA: Insufficient documentation

## 2023-12-27 DIAGNOSIS — K219 Gastro-esophageal reflux disease without esophagitis: Secondary | ICD-10-CM

## 2023-12-27 DIAGNOSIS — I071 Rheumatic tricuspid insufficiency: Secondary | ICD-10-CM | POA: Insufficient documentation

## 2023-12-27 MED ORDER — SEMAGLUTIDE-WEIGHT MANAGEMENT 0.5 MG/0.5ML ~~LOC~~ SOAJ: 0.5000 mg | SUBCUTANEOUS | 0 refills | Status: AC

## 2023-12-27 MED ORDER — SEMAGLUTIDE-WEIGHT MANAGEMENT 1 MG/0.5ML ~~LOC~~ SOAJ
1.0000 mg | SUBCUTANEOUS | 0 refills | Status: AC
Start: 2024-02-23 — End: 2024-03-22

## 2023-12-27 MED ORDER — SEMAGLUTIDE-WEIGHT MANAGEMENT 0.25 MG/0.5ML ~~LOC~~ SOAJ
0.2500 mg | SUBCUTANEOUS | 0 refills | Status: AC
Start: 2023-12-27 — End: 2024-01-24

## 2023-12-28 ENCOUNTER — Ambulatory Visit
Admission: RE | Admit: 2023-12-28 | Discharge: 2023-12-28 | Disposition: A | Discharge status: Home / Self Care | Source: Ambulatory Visit | Attending: Neurosurgery | Admitting: Neurosurgery

## 2023-12-28 ENCOUNTER — Ambulatory Visit
Admission: RE | Admit: 2023-12-28 | Discharge: 2023-12-28 | Disposition: A | Discharge status: Home / Self Care | Attending: Neurosurgery | Admitting: Neurosurgery

## 2023-12-28 DIAGNOSIS — M25512 Pain in left shoulder: Secondary | ICD-10-CM | POA: Diagnosis present

## 2023-12-28 NOTE — Telephone Encounter (Signed)
 Too soon for refill, refilled 11/23/23 for 90 days.  Requested Prescriptions  Pending Prescriptions Disp Refills   pantoprazole  (PROTONIX ) 40 MG tablet [Pharmacy Med Name: PANTOPRAZOLE  SOD DR 40 MG TAB] 90 tablet 0    Sig: Take 1 tablet (40 mg total) by mouth at bedtime.     Gastroenterology: Proton Pump Inhibitors Passed - 12/28/2023  1:57 PM      Passed - Valid encounter within last 12 months    Recent Outpatient Visits           4 days ago [facility] Adeline Hone, PA-C   3 weeks ago Class 1 obesity with serious comorbidity and body mass index (BMI) of 32.0 to 32.9 in adult, unspecified obesity type   [facility] Adeline Hone, PA-C   1 month ago Primary hypertension   [facility] Adeline Hone, PA-C   2 months ago Hospital discharge follow-up   [facility] Adeline Hone, [place]       Future Appointments             In 3 months [name], MD [facility]   In 4 months Adeline Hone, PA-C [facility], [facility]

## 2024-01-17 ENCOUNTER — Ambulatory Visit: Payer: Self-pay | Admitting: Family Medicine

## 2024-02-21 ENCOUNTER — Other Ambulatory Visit: Payer: Self-pay | Admitting: Family Medicine

## 2024-02-21 DIAGNOSIS — E119 Type 2 diabetes mellitus without complications: Secondary | ICD-10-CM

## 2024-02-21 DIAGNOSIS — E039 Hypothyroidism, unspecified: Secondary | ICD-10-CM

## 2024-02-22 NOTE — Telephone Encounter (Signed)
 Requested Prescriptions  Pending Prescriptions Disp Refills   metFORMIN  (GLUCOPHAGE -XR) 500 MG 24 hr tablet [Pharmacy Med Name: METFORMIN  HCL ER 500 MG TABLET] 180 tablet 0    Sig: Take 1 tablet (500 mg total) by mouth 2 (two) times daily.     Endocrinology:  Diabetes - Biguanides Failed - 02/22/2024  1:52 PM      Failed - B12 Level in normal range and within 720 days    No results found for: VITAMINB12       Passed - Cr in normal range and within 360 days    Creat  Date Value Ref Range Status  10/12/2023 0.82 0.60 - 1.00 mg/dL Final   Creatinine, Urine  Date Value Ref Range Status  02/19/2023 80 20 - 275 mg/dL Final         Passed - HBA1C is between 0 and 7.9 and within 180 days    Hgb A1c MFr Bld  Date Value Ref Range Status  10/12/2023 6.4 (H) <5.7 % of total Hgb Final    Comment:    For someone without known diabetes, a hemoglobin  A1c value between 5.7% and 6.4% is consistent with prediabetes and should be confirmed with a  follow-up test. . For someone with known diabetes, a value <7% indicates that their diabetes is well controlled. A1c targets should be individualized based on duration of diabetes, age, comorbid conditions, and other considerations. . This assay result is consistent with an increased risk of diabetes. . Currently, no consensus exists regarding use of hemoglobin A1c for diagnosis of diabetes for children. .          Passed - eGFR in normal range and within 360 days    GFR, Est African American  Date Value Ref Range Status  09/16/2020 80 > OR = 60 mL/min/1.4m2 Final   GFR, Est Non African American  Date Value Ref Range Status  09/16/2020 69 > OR = 60 mL/min/1.33m2 Final   eGFR  Date Value Ref Range Status  10/12/2023 76 > OR = 60 mL/min/1.29m2 Final         Passed - Valid encounter within last 6 months    Recent Outpatient Visits           2 months ago Falls   [facility] Leavy Mole, PA-C   2 months  ago Class 1 obesity with serious comorbidity and body mass index (BMI) of 32.0 to 32.9 in adult, unspecified obesity type   [facility] Leavy Mole, [facility]-C   3 months ago Primary hypertension   [facility] Leavy Mole, PA-C   4 months ago Hospital discharge follow-up   [facility] Leavy Mole, [place]       Future Appointments             In 1 month Dartha Ernst, MD [facility] End[facility]   In 2 months Leavy Mole, PA-C [facility]            Passed - CBC within normal limits and completed in the last 12 months    WBC  Date Value Ref Range Status  10/12/2023 7.9 3.8 - 10.8 Thousand/uL Final   RBC  Date Value Ref Range Status  10/12/2023 4.08 3.80 - 5.10 Million/uL Final   Hemoglobin  Date Value Ref Range Status  10/12/2023 12.0 11.7 - 15.5 g/dL Final  97/86/7982 86.3 11.1 - 15.9 g/dL Final  HCT  Date Value Ref Range Status  10/12/2023 36.5 35.0 - 45.0 % Final   Hematocrit  Date Value Ref Range Status  09/20/2015 38.4 34.0 - 46.6 % Final   MCHC  Date Value Ref Range Status  10/12/2023 32.9 32.0 - 36.0 g/dL Final    Comment:    For adults, a slight decrease in the calculated MCHC value (in the range of 30 to 32 g/dL) is most likely not clinically significant; however, it should be interpreted with caution in correlation with other red cell parameters and the patient's clinical condition.    [facility]  Date Value Ref Range Status  10/12/2023 29.4 27.0 - 33.0 pg Final   MCV  Date Value Ref Range Status  10/12/2023 89.5 80.0 - 100.0 fL Final  09/20/2015 88 79 - 97 fL Final   No results found for: PLTCOUNTKUC, LABPLAT, POCPLA RDW  Date Value Ref Range Status  10/12/2023 12.8 11.0 - 15.0 % Final  09/20/2015 14.2 12.3 - 15.4 % Final          levothyroxine  (SYNTHROID ) 75 MCG tablet [Pharmacy Med Name: LEVOTHYROXINE  75 MCG TABLET]  90 tablet 0    Sig: Take 1 tablet (75 mcg total) by mouth daily before breakfast.     Endocrinology:  Hypothyroid Agents Passed - 02/22/2024  1:52 PM      Passed - TSH in normal range and within 360 days    TSH  Date Value Ref Range Status  07/04/2023 2.88 0.40 - 4.50 mIU/L Final         Passed - Valid encounter within last 12 months    Recent Outpatient Visits           2 months ago Falls   [facility] Leavy Mole, PA-C   2 months ago Class 1 obesity with serious comorbidity and body mass index (BMI) of 32.0 to 32.9 in adult, unspecified obesity type   [facility] Leavy Mole, PA-C   3 months ago Primary hypertension   [facility] Leavy Mole, PA-C   4 months ago Hospital discharge follow-up   [facility] Leavy Mole, [place]       Future Appointments             In 1 month Dartha Ernst, MD [facility] Endocrinology   In 2 months Leavy Mole, PA-C [facility], [facility]

## 2024-03-18 DIAGNOSIS — Z981 Arthrodesis status: Secondary | ICD-10-CM | POA: Diagnosis not present

## 2024-03-21 ENCOUNTER — Other Ambulatory Visit: Payer: Self-pay | Admitting: Family Medicine

## 2024-03-21 DIAGNOSIS — M797 Fibromyalgia: Secondary | ICD-10-CM

## 2024-03-21 DIAGNOSIS — M5412 Radiculopathy, cervical region: Secondary | ICD-10-CM

## 2024-03-25 DIAGNOSIS — M5412 Radiculopathy, cervical region: Secondary | ICD-10-CM | POA: Diagnosis not present

## 2024-03-25 NOTE — Telephone Encounter (Signed)
 Requested medications are due for refill today.  yes  Requested medications are on the active medications list.  yes  Last refill. 12/27/2023 #270 0 rf  Future visit scheduled.   yes  Notes to clinic.  Refill not delegated.    Requested Prescriptions  Pending Prescriptions Disp Refills   pregabalin  (LYRICA ) 100 MG capsule [Pharmacy Med Name: PREGABALIN  100 MG CAPSULE] 270 capsule 0    Sig: Take 1 capsule (100 mg total) by mouth in the morning AND 2 capsules (200 mg total) at bedtime.     Not Delegated - Neurology:  Anticonvulsants - Controlled - pregabalin  Failed - 03/25/2024  9:11 AM      Failed - This refill cannot be delegated      Passed - Cr in normal range and within 360 days    Creat  Date Value Ref Range Status  10/12/2023 0.82 0.60 - 1.00 mg/dL Final   Creatinine, Urine  Date Value Ref Range Status  02/19/2023 80 20 - 275 mg/dL Final         Passed - Completed PHQ-2 or PHQ-9 in the last 360 days      Passed - Valid encounter within last 12 months    Recent Outpatient Visits           3 months ago Falls   [facility] Leavy Mole, PA-C   3 months ago Class 1 obesity with serious comorbidity and body mass index (BMI) of 32.0 to 32.9 in adult, unspecified obesity type   [facility] Leavy Mole, PA-C   4 months ago Primary hypertension   [facility] Leavy Mole, PA-C   5 months ago Hospital discharge follow-up   [facility] Leavy Mole, [place]       Future Appointments             In 1 month Leavy Mole, PA-C [facility]

## 2024-03-31 DIAGNOSIS — M810 Age-related osteoporosis without current pathological fracture: Secondary | ICD-10-CM | POA: Diagnosis not present

## 2024-03-31 DIAGNOSIS — M25552 Pain in left hip: Secondary | ICD-10-CM | POA: Diagnosis not present

## 2024-03-31 DIAGNOSIS — Z1331 Encounter for screening for depression: Secondary | ICD-10-CM | POA: Diagnosis not present

## 2024-03-31 DIAGNOSIS — M4726 Other spondylosis with radiculopathy, lumbar region: Secondary | ICD-10-CM | POA: Diagnosis not present

## 2024-03-31 DIAGNOSIS — M25551 Pain in right hip: Secondary | ICD-10-CM | POA: Diagnosis not present

## 2024-03-31 DIAGNOSIS — G8929 Other chronic pain: Secondary | ICD-10-CM | POA: Diagnosis not present

## 2024-03-31 DIAGNOSIS — M7061 Trochanteric bursitis, right hip: Secondary | ICD-10-CM | POA: Diagnosis not present

## 2024-03-31 DIAGNOSIS — Z981 Arthrodesis status: Secondary | ICD-10-CM | POA: Diagnosis not present

## 2024-03-31 DIAGNOSIS — Z79891 Long term (current) use of opiate analgesic: Secondary | ICD-10-CM | POA: Diagnosis not present

## 2024-03-31 DIAGNOSIS — M542 Cervicalgia: Secondary | ICD-10-CM | POA: Diagnosis not present

## 2024-03-31 DIAGNOSIS — M51372 Other intervertebral disc degeneration, lumbosacral region with discogenic back pain and lower extremity pain: Secondary | ICD-10-CM | POA: Diagnosis not present

## 2024-03-31 DIAGNOSIS — Z5181 Encounter for therapeutic drug level monitoring: Secondary | ICD-10-CM | POA: Diagnosis not present

## 2024-04-14 ENCOUNTER — Ambulatory Visit: Admitting: "Endocrinology

## 2024-04-26 ENCOUNTER — Other Ambulatory Visit: Payer: Self-pay | Admitting: Family Medicine

## 2024-04-26 DIAGNOSIS — K219 Gastro-esophageal reflux disease without esophagitis: Secondary | ICD-10-CM

## 2024-04-28 DIAGNOSIS — M810 Age-related osteoporosis without current pathological fracture: Secondary | ICD-10-CM | POA: Diagnosis not present

## 2024-04-28 NOTE — Telephone Encounter (Signed)
 Requested Prescriptions  Pending Prescriptions Disp Refills   pantoprazole  (PROTONIX ) 40 MG tablet [Pharmacy Med Name: PANTOPRAZOLE  SOD DR 40 MG TAB] 90 tablet 0    Sig: Take 1 tablet (40 mg total) by mouth at bedtime.     Gastroenterology: Proton Pump Inhibitors Passed - 04/28/2024  2:22 PM      Passed - Valid encounter within last 12 months    Recent Outpatient Visits           4 months ago Falls   [facility] Leavy Mole, PA-C   4 months ago Class 1 obesity with serious comorbidity and body mass index (BMI) of 32.0 to 32.9 in adult, unspecified obesity type   [facility] Leavy Mole, PA-C   5 months ago Primary hypertension   [facility] Leavy Mole, PA-C   6 months ago Hospital discharge follow-up   [facility] Leavy Mole, [place]       Future Appointments             In 3 weeks Leavy Mole, PA-C [facility], [place]

## 2024-05-05 DIAGNOSIS — Z87898 Personal history of other specified conditions: Secondary | ICD-10-CM | POA: Diagnosis not present

## 2024-05-05 DIAGNOSIS — J45909 Unspecified asthma, uncomplicated: Secondary | ICD-10-CM | POA: Diagnosis not present

## 2024-05-05 DIAGNOSIS — Z8679 Personal history of other diseases of the circulatory system: Secondary | ICD-10-CM | POA: Diagnosis not present

## 2024-05-05 DIAGNOSIS — E66811 Obesity, class 1: Secondary | ICD-10-CM | POA: Diagnosis not present

## 2024-05-05 DIAGNOSIS — E119 Type 2 diabetes mellitus without complications: Secondary | ICD-10-CM | POA: Diagnosis not present

## 2024-05-05 DIAGNOSIS — R002 Palpitations: Secondary | ICD-10-CM | POA: Diagnosis not present

## 2024-05-05 DIAGNOSIS — E782 Mixed hyperlipidemia: Secondary | ICD-10-CM | POA: Diagnosis not present

## 2024-05-05 DIAGNOSIS — I10 Essential (primary) hypertension: Secondary | ICD-10-CM | POA: Diagnosis not present

## 2024-05-05 DIAGNOSIS — I38 Endocarditis, valve unspecified: Secondary | ICD-10-CM | POA: Diagnosis not present

## 2024-05-19 ENCOUNTER — Ambulatory Visit: Admitting: Family Medicine

## 2024-05-21 DIAGNOSIS — K644 Residual hemorrhoidal skin tags: Secondary | ICD-10-CM | POA: Diagnosis not present

## 2024-05-21 DIAGNOSIS — Z860101 Personal history of adenomatous and serrated colon polyps: Secondary | ICD-10-CM | POA: Diagnosis not present

## 2024-05-21 DIAGNOSIS — K5730 Diverticulosis of large intestine without perforation or abscess without bleeding: Secondary | ICD-10-CM | POA: Diagnosis not present

## 2024-05-22 ENCOUNTER — Ambulatory Visit: Admitting: Nurse Practitioner

## 2024-05-22 ENCOUNTER — Encounter: Payer: Self-pay | Admitting: Nurse Practitioner

## 2024-05-22 VITALS — BP 118/68 | HR 88 | Resp 16 | Ht 64.0 in | Wt 190.0 lb

## 2024-05-22 DIAGNOSIS — E782 Mixed hyperlipidemia: Secondary | ICD-10-CM

## 2024-05-22 DIAGNOSIS — E039 Hypothyroidism, unspecified: Secondary | ICD-10-CM

## 2024-05-22 DIAGNOSIS — M1990 Unspecified osteoarthritis, unspecified site: Secondary | ICD-10-CM | POA: Diagnosis not present

## 2024-05-22 DIAGNOSIS — M797 Fibromyalgia: Secondary | ICD-10-CM

## 2024-05-22 DIAGNOSIS — R232 Flushing: Secondary | ICD-10-CM | POA: Diagnosis not present

## 2024-05-22 DIAGNOSIS — Z23 Encounter for immunization: Secondary | ICD-10-CM

## 2024-05-22 DIAGNOSIS — R7303 Prediabetes: Secondary | ICD-10-CM

## 2024-05-22 DIAGNOSIS — R21 Rash and other nonspecific skin eruption: Secondary | ICD-10-CM | POA: Diagnosis not present

## 2024-05-22 DIAGNOSIS — J4520 Mild intermittent asthma, uncomplicated: Secondary | ICD-10-CM | POA: Diagnosis not present

## 2024-05-22 DIAGNOSIS — J4521 Mild intermittent asthma with (acute) exacerbation: Secondary | ICD-10-CM

## 2024-05-22 DIAGNOSIS — M2550 Pain in unspecified joint: Secondary | ICD-10-CM

## 2024-05-22 DIAGNOSIS — E66811 Obesity, class 1: Secondary | ICD-10-CM

## 2024-05-22 DIAGNOSIS — I10 Essential (primary) hypertension: Secondary | ICD-10-CM | POA: Diagnosis not present

## 2024-05-22 DIAGNOSIS — K219 Gastro-esophageal reflux disease without esophagitis: Secondary | ICD-10-CM | POA: Diagnosis not present

## 2024-05-22 DIAGNOSIS — G35D Multiple sclerosis, unspecified: Secondary | ICD-10-CM

## 2024-05-22 DIAGNOSIS — F3342 Major depressive disorder, recurrent, in full remission: Secondary | ICD-10-CM

## 2024-05-22 DIAGNOSIS — G8929 Other chronic pain: Secondary | ICD-10-CM

## 2024-05-22 DIAGNOSIS — G629 Polyneuropathy, unspecified: Secondary | ICD-10-CM

## 2024-05-22 DIAGNOSIS — M5412 Radiculopathy, cervical region: Secondary | ICD-10-CM

## 2024-05-22 DIAGNOSIS — E785 Hyperlipidemia, unspecified: Secondary | ICD-10-CM

## 2024-05-22 DIAGNOSIS — K5909 Other constipation: Secondary | ICD-10-CM

## 2024-05-22 DIAGNOSIS — J302 Other seasonal allergic rhinitis: Secondary | ICD-10-CM

## 2024-05-22 MED ORDER — METFORMIN HCL ER 500 MG PO TB24: 500.0000 mg | ORAL_TABLET | Freq: Two times a day (BID) | ORAL | 0 refills | Status: DC

## 2024-05-22 MED ORDER — HYDRALAZINE HCL 10 MG PO TABS: 10.0000 mg | ORAL_TABLET | Freq: Two times a day (BID) | ORAL | 1 refills | Status: AC | PRN

## 2024-05-22 MED ORDER — SPIRONOLACTONE 25 MG PO TABS: 25.0000 mg | ORAL_TABLET | Freq: Every day | ORAL | 1 refills | Status: AC

## 2024-05-22 MED ORDER — METOPROLOL SUCCINATE ER 50 MG PO TB24: 50.0000 mg | ORAL_TABLET | Freq: Every day | ORAL | 1 refills | Status: AC

## 2024-05-22 MED ORDER — PANTOPRAZOLE SODIUM 40 MG PO TBEC: 40.0000 mg | DELAYED_RELEASE_TABLET | Freq: Every day | ORAL | 1 refills | Status: AC

## 2024-05-22 MED ORDER — LINACLOTIDE 72 MCG PO CAPS: 72.0000 ug | ORAL_CAPSULE | Freq: Every day | ORAL | 5 refills | Status: AC

## 2024-05-22 MED ORDER — PREGABALIN 100 MG PO CAPS: ORAL_CAPSULE | ORAL | 1 refills | Status: DC

## 2024-05-22 MED ORDER — ROSUVASTATIN CALCIUM 10 MG PO TABS: 10.0000 mg | ORAL_TABLET | Freq: Every day | ORAL | 2 refills | Status: AC

## 2024-05-22 MED ORDER — FAMOTIDINE 20 MG PO TABS: 20.0000 mg | ORAL_TABLET | Freq: Two times a day (BID) | ORAL | 5 refills | Status: AC | PRN

## 2024-05-22 MED ORDER — FLUTICASONE FUROATE 27.5 MCG/SPRAY NA SUSP: 1.0000 | Freq: Every day | NASAL | 2 refills | Status: AC

## 2024-05-22 MED ORDER — MONTELUKAST SODIUM 10 MG PO TABS: 10.0000 mg | ORAL_TABLET | Freq: Every day | ORAL | 1 refills | Status: AC

## 2024-05-22 MED ORDER — DULOXETINE HCL 30 MG PO CPEP: ORAL_CAPSULE | ORAL | 1 refills | Status: DC

## 2024-05-22 NOTE — Progress Notes (Signed)
 BP 118/68   Pulse 88   Resp 16   Ht 5' 4 (1.626 m)   Wt 190 lb (86.2 kg)   SpO2 98%   BMI 32.61 kg/m    Subjective:    Patient ID: Brittany Hill, female    DOB: 10/24/1950, 73 y.o.   MRN: [id number]  HPI: Brittany Hill is a 73 y.o. female  Chief Complaint  Patient presents with   Medical Management of Chronic Issues   Discussed the use of AI scribe software for clinical note transcription with the patient, who gave verbal consent to proceed.  History of Present Illness Brittany Hill is a 73 year old female who presents for a follow-up visit.  New symptoms - Hot flashes occurring over the past couple of months - Facial blotches present during episodes -joint pain -reports she is concerned for lupus  Headache - Severe morning headaches localized across the forehead and eyes - Described as feeling like a mallet striking the head - Headaches are short-lived  Joint and neck pain/fibromyalgia/OA - Chronic joint pain, particularly in the neck - History of cervical fusion from C1 to T1 and at the bottom of the cervical spine -currently takes lyrica  100 mg in am and 200 mg at bedtime Cymbalta  30 mg daily  Hypertension - Blood pressure generally well-controlled at 118/60 mmHg - Blood pressure increases with pain episodes - Avoids additional antihypertensive medication during pain to prevent hypotension  Asthma symptoms - Asthma symptoms occur only with bronchitis  - Currently taking Singulair  10 mg daily  Prediabetes - Diagnosed with prediabetes, reports she does not have diabetes  - Takes metformin  500 mg twice daily - Does not regularly monitor blood glucose levels  Hypothyroidism -reports she is taking her levothyroxine  75 mcg daily -denies any changes    Chronic constipation/GERD -takes linzess  and it is stable at this time -currently taking pepcid  20 mg 2 times a day as needed and pantoprazole  40 mg daily -no changes  MS -doing well,  no changes. Not currently on treatment  Obesity Wt Readings from Last 3 Encounters:  05/22/24 190 lb (86.2 kg)  12/24/23 186 lb (84.4 kg)  12/03/23 188 lb (85.3 kg)    Body mass index is 32.61 kg/m.  - Encourage continuation of lifestyle modifications, including dietary management and regular exercise. -continue to increase physical activity, getting at least 150 min of physical activity a week.  Work on including Runner, broadcasting/film/video 2 days a week.  - continue eating at a calorie deficit 1600-1700 cal a day, eating a well balanced diet with whole foods, avoiding processed foods.   Patient is motivated to continue working on lifestyle modification.    Depression -currently taking cymbalta  30 mg daily        05/22/2024   11:06 AM 11/23/2023    1:21 PM 10/11/2023   10:05 AM  Depression screen PHQ 2/9  Decreased Interest 0 2 2  Down, Depressed, Hopeless 0 2 2  PHQ - 2 Score 0 4 4  Altered sleeping 0 0 0  Tired, decreased energy 0 0 0  Change in appetite 0 0 0  Feeling bad or failure about yourself  0 0 0  Trouble concentrating 0 0 0  Moving slowly or fidgety/restless 0 0 0  Suicidal thoughts 0 0 0  PHQ-9 Score 0 4 4  Difficult doing work/chores  Not difficult at all Not difficult at all    Relevant past medical, surgical, family and social  history reviewed and updated as indicated. Interim medical history since our last visit reviewed. Allergies and medications reviewed and updated.  Review of Systems  Constitutional: Negative for fever or weight change.  Respiratory: Negative for cough and shortness of breath.   Cardiovascular: Negative for chest pain or palpitations.  Gastrointestinal: Negative for abdominal pain, no bowel changes.  Musculoskeletal: Negative for gait problem or joint swelling.  Skin: Negative for rash.  Neurological: Negative for dizziness or headache.  No other specific complaints in a complete review of systems (except as listed in HPI above).       Objective:      BP 118/68   Pulse 88   Resp 16   Ht 5' 4 (1.626 m)   Wt 190 lb (86.2 kg)   SpO2 98%   BMI 32.61 kg/m    Wt Readings from Last 3 Encounters:  05/22/24 190 lb (86.2 kg)  12/24/23 186 lb (84.4 kg)  12/03/23 188 lb (85.3 kg)    Physical Exam VITALS: BP- 118/60 MEASUREMENTS: Weight- 190, BMI- 32.61. GENERAL: Alert, cooperative, well developed, no acute distress. HEENT: Normocephalic, normal oropharynx, moist mucous membranes. CHEST: Clear to auscultation bilaterally, no wheezes, rhonchi, or crackles. CARDIOVASCULAR: Normal heart rate and rhythm, S1 and S2 normal without murmurs. ABDOMEN: Soft, non-tender, non-distended, without organomegaly, normal bowel sounds. EXTREMITIES: No cyanosis or edema. NEUROLOGICAL: Cranial nerves grossly intact, moves all extremities without gross motor or sensory deficit.  Results for orders placed or performed in visit on 10/12/23  CBC with Differential/Platelet   Collection Time: 10/12/23  2:50 PM  Result Value Ref Range   WBC 7.9 3.8 - 10.8 Thousand/uL   RBC 4.08 3.80 - 5.10 Million/uL   Hemoglobin 12.0 11.7 - 15.5 g/dL   HCT 63.4 64.9 - 54.9 %   MCV 89.5 80.0 - 100.0 fL   MCH 29.4 27.0 - 33.0 pg   MCHC 32.9 32.0 - 36.0 g/dL   RDW 87.1 88.9 - 84.9 %   Platelets 325 140 - 400 Thousand/uL   MPV 10.2 7.5 - 12.5 fL   Neutro Abs 5,056 1,500 - 7,800 cells/uL   Absolute Lymphocytes 1,833 850 - 3,900 cells/uL   Absolute Monocytes 632 200 - 950 cells/uL   Eosinophils Absolute 348 15 - 500 cells/uL   Basophils Absolute 32 0 - 200 cells/uL   Neutrophils Relative % 64 %   Total Lymphocyte 23.2 %   Monocytes Relative 8.0 %   Eosinophils Relative 4.4 %   Basophils Relative 0.4 %  COMPLETE METABOLIC PANEL WITH GFR   Collection Time: 10/12/23  2:50 PM  Result Value Ref Range   Glucose, Bld 113 (H) 65 - 99 mg/dL   BUN 9 7 - 25 mg/dL   Creat 9.17 9.39 - 8.99 mg/dL   eGFR 76 > OR = 60 fO/fpw/8.26f7   BUN/Creatinine Ratio SEE NOTE:  6 - 22 (calc)   Sodium 140 135 - 146 mmol/L   Potassium 4.3 3.5 - 5.3 mmol/L   Chloride 103 98 - 110 mmol/L   CO2 28 20 - 32 mmol/L   Calcium  10.5 (H) 8.6 - 10.4 mg/dL   Total Protein 7.6 6.1 - 8.1 g/dL   Albumin 4.8 3.6 - 5.1 g/dL   Globulin 2.8 1.9 - 3.7 g/dL (calc)   AG Ratio 1.7 1.0 - 2.5 (calc)   Total Bilirubin 0.3 0.2 - 1.2 mg/dL   Alkaline phosphatase (APISO) 65 37 - 153 U/L   AST 19 10 - 35 U/L  ALT 15 6 - 29 U/L  Hemoglobin A1c   Collection Time: 10/12/23  2:50 PM  Result Value Ref Range   Hgb A1c MFr Bld 6.4 (H) <5.7 % of total Hgb   Mean Plasma Glucose 137 mg/dL   eAG (mmol/L) 7.6 mmol/L  Calcium , ionized   Collection Time: 10/12/23  2:50 PM  Result Value Ref Range   Calcium , Ion 5.4 4.7 - 5.5 mg/dL          Assessment & Plan:   Problem List Items Addressed This Visit       Cardiovascular and Mediastinum   Hypertension - Primary (Chronic)   Relevant Medications   hydrALAZINE  (APRESOLINE ) 10 MG tablet   rosuvastatin  (CRESTOR ) 10 MG tablet   spironolactone  (ALDACTONE ) 25 MG tablet   metoprolol  succinate (TOPROL -XL) 50 MG 24 hr tablet   Other Relevant Orders   CBC with Differential/Platelet   Comprehensive metabolic panel with GFR     Respiratory   Asthma without status asthmaticus   Relevant Medications   montelukast  (SINGULAIR ) 10 MG tablet     Digestive   Chronic constipation (Chronic)   Relevant Medications   linaclotide  (LINZESS ) 72 MCG capsule   GERD (gastroesophageal reflux disease) (Chronic)   Relevant Medications   famotidine  (PEPCID ) 20 MG tablet   linaclotide  (LINZESS ) 72 MCG capsule   pantoprazole  (PROTONIX ) 40 MG tablet     Endocrine   Hypothyroidism (Chronic)   Relevant Medications   metoprolol  succinate (TOPROL -XL) 50 MG 24 hr tablet   Other Relevant Orders   TSH   RESOLVED: Dyslipidemia associated with type 2 diabetes mellitus (HCC)   Relevant Medications   metFORMIN  (GLUCOPHAGE -XR) 500 MG 24 hr tablet   rosuvastatin   (CRESTOR ) 10 MG tablet     Nervous and Auditory   MS (multiple sclerosis) (HCC) (Chronic)   Cervical radiculopathy   Relevant Medications   DULoxetine  (CYMBALTA ) 30 MG capsule   pregabalin  (LYRICA ) 100 MG capsule     Musculoskeletal and Integument   OA (osteoarthritis)     Other   Fibromyalgia (Chronic)   Relevant Medications   DULoxetine  (CYMBALTA ) 30 MG capsule   pregabalin  (LYRICA ) 100 MG capsule   Hyperlipidemia (Chronic)   Relevant Medications   hydrALAZINE  (APRESOLINE ) 10 MG tablet   rosuvastatin  (CRESTOR ) 10 MG tablet   spironolactone  (ALDACTONE ) 25 MG tablet   metoprolol  succinate (TOPROL -XL) 50 MG 24 hr tablet   Other Relevant Orders   Comprehensive metabolic panel with GFR   Lipid panel   MDD (major depressive disorder), recurrent, in full remission   Relevant Medications   DULoxetine  (CYMBALTA ) 30 MG capsule   Class 1 obesity with serious comorbidity and body mass index (BMI) of 32.0 to 32.9 in adult   Relevant Medications   metFORMIN  (GLUCOPHAGE -XR) 500 MG 24 hr tablet   Prediabetes   Relevant Medications   metFORMIN  (GLUCOPHAGE -XR) 500 MG 24 hr tablet   Other Relevant Orders   Comprehensive metabolic panel with GFR   Microalbumin / creatinine urine ratio   Hemoglobin A1c   Other Visit Diagnoses       Immunization due       Relevant Orders   Flu vaccine HIGH DOSE PF(Fluzone Trivalent) (Completed)     Hot flashes       Relevant Medications   hydrALAZINE  (APRESOLINE ) 10 MG tablet   rosuvastatin  (CRESTOR ) 10 MG tablet   spironolactone  (ALDACTONE ) 25 MG tablet   metoprolol  succinate (TOPROL -XL) 50 MG 24 hr tablet   Other Relevant Orders  Analyzer ANA IFA w/RFLX Titer/Pattern,Systemic Autoimmune Panel 1     Arthralgia, unspecified joint       Relevant Orders   Analyzer ANA IFA w/RFLX Titer/Pattern,Systemic Autoimmune Panel 1     Facial rash       Relevant Orders   Analyzer ANA IFA w/RFLX Titer/Pattern,Systemic Autoimmune Panel 1     Chronic back  pain, unspecified back location, unspecified back pain laterality       Relevant Medications   DULoxetine  (CYMBALTA ) 30 MG capsule   pregabalin  (LYRICA ) 100 MG capsule     Neuropathy       Relevant Medications   DULoxetine  (CYMBALTA ) 30 MG capsule     Seasonal allergic rhinitis, unspecified trigger       Relevant Medications   fluticasone  (VERAMYST) 27.5 MCG/SPRAY nasal spray   montelukast  (SINGULAIR ) 10 MG tablet     Essential hypertension       borderline HTN control - frequently pt has SBP 140's - if >140/80 I would add ACEI or ARB, but some readings lower and better controlled - meds per cardiology   Relevant Medications   hydrALAZINE  (APRESOLINE ) 10 MG tablet   rosuvastatin  (CRESTOR ) 10 MG tablet   spironolactone  (ALDACTONE ) 25 MG tablet   metoprolol  succinate (TOPROL -XL) 50 MG 24 hr tablet        Assessment and Plan Assessment & Plan Adult Wellness Visit Routine follow-up visit with no new significant issues reported. - Ordered lab work including microalbumin   Hypertension Blood pressure is well-controlled at 118/68 mmHg. Reports occasional elevation with pain, but prefers to manage without medication during these episodes to avoid hypotension.   Asthma stable - Asthma symptoms occur only with bronchitis  - Currently taking Singulair  10 mg daily  Gastroesophageal reflux disease (GERD) Managed with famotidine  and pantoprazole  as needed.  Dyslipidemia- continue taking rosuvastatin  10 mg daily, getting labs today Lipid Panel     Component Value Date/Time   CHOL 163 07/04/2023 1155   CHOL 150 06/03/2015 0927   TRIG 197 (H) 07/04/2023 1155   HDL 49 (L) 07/04/2023 1155   HDL 56 06/03/2015 0927   CHOLHDL 3.3 07/04/2023 1155   VLDL 28 03/21/2017 1026   LDLCALC 85 07/04/2023 1155   LABVLDL 25 06/03/2015 0927     Hypothyroidism-no new symptoms, getting labs,  currently taking levothyroxine  75 mcg daily  Multiple sclerosis- reports doing well,  not currently under  treatment  Osteoarthritis Chronic joint pain reported, particularly in the neck, back, hip, and leg. Cervical and lumbar spinal fusion with ongoing pain.  Obesity BMI is 32.61, indicating obesity. - Encourage continuation of lifestyle modifications, including dietary management and regular exercise. -continue to increase physical activity, getting at least 150 min of physical activity a week.  Work on including Runner, broadcasting/film/video 2 days a week.  - continue eating at a calorie deficit 1600-1700 cal a day, eating a well balanced diet with whole foods, avoiding processed foods.   Patient is motivated to continue working on lifestyle modification.    Fibromyalgia Chronic pain managed with Lyrica  and tizanidine .  Depression- managed with cymbalta  30 mg daily  Chronic constipation Managed with Linzess .  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 Awaiting further spinal fusion surgery pending bone strength assessment after Evenity treatment. - Continue Evenity treatment - Await bone strength assessment for potential spinal fusion surgery  Suspected systemic lupus erythematosus (SLE) Reports symptoms suggestive of SLE, including hot flashes, facial blotches, and  headaches. Differential diagnosis includes SLE. - Ordered lupus panel to rule out SLE  Varicose vein pain, unspecified site Reports tender veins, possibly varicose, causing discomfort. - Will consider referral to vascular specialist for evaluation and potential treatment        Follow up plan: Return in about 6 months (around 11/20/2024) for follow up.

## 2024-05-23 ENCOUNTER — Ambulatory Visit: Payer: Self-pay | Admitting: Nurse Practitioner

## 2024-05-23 DIAGNOSIS — E039 Hypothyroidism, unspecified: Secondary | ICD-10-CM

## 2024-05-23 DIAGNOSIS — R7689 Other specified abnormal immunological findings in serum: Secondary | ICD-10-CM

## 2024-05-23 DIAGNOSIS — M2550 Pain in unspecified joint: Secondary | ICD-10-CM

## 2024-05-23 DIAGNOSIS — R21 Rash and other nonspecific skin eruption: Secondary | ICD-10-CM

## 2024-05-23 MED ORDER — LEVOTHYROXINE SODIUM 88 MCG PO TABS: 88.0000 ug | ORAL_TABLET | Freq: Every day | ORAL | 0 refills | Status: DC

## 2024-05-26 DIAGNOSIS — M810 Age-related osteoporosis without current pathological fracture: Secondary | ICD-10-CM | POA: Diagnosis not present

## 2024-05-29 ENCOUNTER — Encounter: Payer: Self-pay | Admitting: Nurse Practitioner

## 2024-05-29 ENCOUNTER — Ambulatory Visit: Payer: Self-pay

## 2024-05-29 LAB — LIPID PANEL
Cholesterol: 143 mg/dL (ref <200)
HDL: 53 mg/dL (ref >=50)
LDL Cholesterol (Calc): 67 mg/dL
Non-HDL Cholesterol (Calc): 90 mg/dL (ref <130)
Total CHOL/HDL Ratio: 2.7 (calc) (ref <5.0)
Triglycerides: 157 mg/dL — ABNORMAL HIGH (ref <150)

## 2024-05-29 LAB — ANALYZER(R)ANA IFA WITH REFLEX TITER/PATTRN,SYS AUTOIMM PNL1
Anti Nuclear Antibody (ANA): POSITIVE — AB
Anticardiolipin IgA: 10.1 [APL'U]/mL
Anticardiolipin IgG: 2.6 [GPL'U]/mL
Anticardiolipin IgM: 4.9 [MPL'U]/mL
Beta-2 Glyco 1 IgA: 9 U/mL
Beta-2 Glyco 1 IgM: 5.3 U/mL
Beta-2 Glyco I IgG: 2.8 U/mL
C3 Complement: 126 mg/dL (ref 83–193)
C4 Complement: 18 mg/dL (ref 15–57)
Centromere Ab Screen: <1 AI
Chromatin (Nucleosomal) Antibody: <1 AI
Cyclic Citrullin Peptide Ab: <16 U
DNA Ab (DS) Crithidia, IFA: NEGATIVE
ENA SM Ab Ser-aCnc: <1 AI
Jo-1 Autoabs: <1 AI
MUTATED CITRULLINATED VIMENTIN (MCV) AB: <20 U/mL (ref <20)
Rheumatoid Factor (IgA): <5 U
Rheumatoid Factor (IgG): <5 U
Rheumatoid Factor (IgM): <5 U
Ribonucleic Protein(ENA) Antibody, IgG: <1 AI
SM/RNP: <1 AI
SSA (Ro) (ENA) Antibody, IgG: <1 AI
SSB (La) (ENA) Antibody, IgG: <1 AI
Scleroderma (Scl-70) (ENA) Antibody, IgG: <1 AI
Thyroperoxidase Ab SerPl-aCnc: 228 [IU]/mL — ABNORMAL HIGH (ref <9)

## 2024-05-29 LAB — CBC WITH DIFFERENTIAL/PLATELET
Absolute Lymphocytes: 1495 {cells}/uL (ref 850–3900)
Absolute Monocytes: 524 {cells}/uL (ref 200–950)
Basophils Absolute: 20 {cells}/uL (ref 0–200)
Basophils Relative: 0.4 %
Eosinophils Absolute: 181 {cells}/uL (ref 15–500)
Eosinophils Relative: 3.7 %
HCT: 34.7 % — ABNORMAL LOW (ref 35.0–45.0)
Hemoglobin: 11.8 g/dL (ref 11.7–15.5)
MCH: 30.5 pg (ref 27.0–33.0)
MCHC: 34 g/dL (ref 32.0–36.0)
MCV: 89.7 fL (ref 80.0–100.0)
MPV: 10.7 fL (ref 7.5–12.5)
Monocytes Relative: 10.7 %
Neutro Abs: 2680 {cells}/uL (ref 1500–7800)
Neutrophils Relative %: 54.7 %
Platelets: 192 Thousand/uL (ref 140–400)
RBC: 3.87 Million/uL (ref 3.80–5.10)
RDW: 13.2 % (ref 11.0–15.0)
Total Lymphocyte: 30.5 %
WBC: 4.9 Thousand/uL (ref 3.8–10.8)

## 2024-05-29 LAB — COMPREHENSIVE METABOLIC PANEL WITH GFR
AG Ratio: 2.4 (calc) (ref 1.0–2.5)
ALT: 15 U/L (ref 6–29)
AST: 22 U/L (ref 10–35)
Albumin: 5.1 g/dL (ref 3.6–5.1)
Alkaline phosphatase (APISO): 50 U/L (ref 37–153)
BUN: 8 mg/dL (ref 7–25)
CO2: 27 mmol/L (ref 20–32)
Calcium: 10.1 mg/dL (ref 8.6–10.4)
Chloride: 101 mmol/L (ref 98–110)
Creat: 0.84 mg/dL (ref 0.60–1.00)
Globulin: 2.1 g/dL (ref 1.9–3.7)
Glucose, Bld: 95 mg/dL (ref 65–99)
Potassium: 3.9 mmol/L (ref 3.5–5.3)
Sodium: 138 mmol/L (ref 135–146)
Total Bilirubin: 0.3 mg/dL (ref 0.2–1.2)
Total Protein: 7.2 g/dL (ref 6.1–8.1)
eGFR: 73 mL/min/1.73m2 (ref >=60)

## 2024-05-29 LAB — MICROALBUMIN / CREATININE URINE RATIO
Creatinine, Urine: 128 mg/dL (ref 20–275)
Microalb Creat Ratio: 5 mg/g{creat} (ref <30)
Microalb, Ur: 0.7 mg/dL

## 2024-05-29 LAB — TSH: TSH: 5.05 m[IU]/L — ABNORMAL HIGH (ref 0.40–4.50)

## 2024-05-29 LAB — ANTI-NUCLEAR AB-TITER (ANA TITER)
ANA TITER: 1:320 {titer} — ABNORMAL HIGH
ANA Titer 1: 1:80 {titer} — ABNORMAL HIGH

## 2024-05-29 LAB — HEMOGLOBIN A1C
Hgb A1c MFr Bld: 6.1 % — ABNORMAL HIGH (ref <5.7)
Mean Plasma Glucose: 128 mg/dL
eAG (mmol/L): 7.1 mmol/L

## 2024-05-29 NOTE — Telephone Encounter (Signed)
 Patient is requesting a referral to [facility] for rheumatology instead of having to go to [place]. Patient states she knows the [facility] system more than [place].   FYI Only or Action Required?: Action required by provider: referral request.  Patient was last seen in primary care on 05/22/2024 by Gareth Mliss FALCON, FNP.  Called Nurse Triage reporting Advice Only.  Triage Disposition: Call PCP Within 24 Hours  Patient/caregiver understands and will follow disposition?: Yes  Reason for Disposition  [1] Caller requests to speak ONLY to PCP AND [2] NON-URGENT question  Answer Assessment - Initial Assessment Questions 1. REASON FOR CALL or QUESTION: What is your reason for calling today? or How can I best     Patient calling to clarify the rheumatology referral. Patient was unsure of why she needed the rheumatology referral. Was able to clarify based on provider note. Patient is asking if a referral could be sent to [facility] for rheumatology as she is more familiar with that system.  2. CALLER: Document the source of call. (e.g., laboratory staff, caregiver or patient).     Patient.  Protocols used: PCP Call - No Triage-A-AH

## 2024-05-30 ENCOUNTER — Other Ambulatory Visit: Payer: Self-pay

## 2024-05-30 DIAGNOSIS — M2550 Pain in unspecified joint: Secondary | ICD-10-CM

## 2024-05-30 DIAGNOSIS — R21 Rash and other nonspecific skin eruption: Secondary | ICD-10-CM

## 2024-05-30 DIAGNOSIS — R7689 Other specified abnormal immunological findings in serum: Secondary | ICD-10-CM

## 2024-06-03 DIAGNOSIS — M47812 Spondylosis without myelopathy or radiculopathy, cervical region: Secondary | ICD-10-CM | POA: Diagnosis not present

## 2024-06-03 DIAGNOSIS — G8929 Other chronic pain: Secondary | ICD-10-CM | POA: Diagnosis not present

## 2024-06-03 DIAGNOSIS — M542 Cervicalgia: Secondary | ICD-10-CM | POA: Diagnosis not present

## 2024-06-03 DIAGNOSIS — Z981 Arthrodesis status: Secondary | ICD-10-CM | POA: Diagnosis not present

## 2024-06-03 DIAGNOSIS — M7061 Trochanteric bursitis, right hip: Secondary | ICD-10-CM | POA: Diagnosis not present

## 2024-06-03 DIAGNOSIS — Z5181 Encounter for therapeutic drug level monitoring: Secondary | ICD-10-CM | POA: Diagnosis not present

## 2024-06-03 DIAGNOSIS — M47816 Spondylosis without myelopathy or radiculopathy, lumbar region: Secondary | ICD-10-CM | POA: Diagnosis not present

## 2024-06-03 DIAGNOSIS — M25551 Pain in right hip: Secondary | ICD-10-CM | POA: Diagnosis not present

## 2024-06-03 DIAGNOSIS — Z79891 Long term (current) use of opiate analgesic: Secondary | ICD-10-CM | POA: Diagnosis not present

## 2024-06-17 NOTE — Telephone Encounter (Unsigned)
 Copied from CRM [id number]. Topic: Referral - Question >> Jun 17, 2024 12:05 PM [place] MATSU wrote: Reason for CRM: [facility] is requesting additional information in order to schedule patient. Per patient she has called but was told they can not schedule her yet. Per patient they requested additional information  on 06-12-24 and they haven't heard back.    Patient is requesting a call back with an update.   May you please assist.

## 2024-06-23 DIAGNOSIS — M810 Age-related osteoporosis without current pathological fracture: Secondary | ICD-10-CM | POA: Diagnosis not present

## 2024-06-24 ENCOUNTER — Encounter: Payer: Self-pay | Admitting: Nurse Practitioner

## 2024-07-17 LAB — OPHTHALMOLOGY REPORT-SCANNED

## 2024-07-18 ENCOUNTER — Ambulatory Visit: Admitting: Family Medicine

## 2024-07-18 ENCOUNTER — Encounter: Payer: Self-pay | Admitting: Family Medicine

## 2024-07-18 VITALS — BP 102/60 | HR 85 | Temp 97.8°F | Resp 16 | Ht 64.0 in | Wt 192.2 lb

## 2024-07-18 DIAGNOSIS — R52 Pain, unspecified: Secondary | ICD-10-CM

## 2024-07-18 DIAGNOSIS — J029 Acute pharyngitis, unspecified: Secondary | ICD-10-CM

## 2024-07-18 DIAGNOSIS — J069 Acute upper respiratory infection, unspecified: Secondary | ICD-10-CM | POA: Diagnosis not present

## 2024-07-18 LAB — POC COVID19/FLU A&B COMBO
Covid Antigen, POC: NEGATIVE
Influenza A Antigen, POC: NEGATIVE
Influenza B Antigen, POC: NEGATIVE

## 2024-07-18 LAB — POCT RAPID STREP A (OFFICE): Rapid Strep A Screen: NEGATIVE

## 2024-07-18 MED ORDER — PROMETHAZINE-DM 6.25-15 MG/5ML PO SYRP: 2.5000 mL | ORAL_SOLUTION | Freq: Four times a day (QID) | ORAL | 0 refills | Status: AC | PRN

## 2024-07-18 MED ORDER — TRELEGY ELLIPTA 200-62.5-25 MCG/ACT IN AEPB: 1.0000 | INHALATION_SPRAY | Freq: Every day | RESPIRATORY_TRACT | Status: AC

## 2024-07-18 NOTE — Progress Notes (Signed)
 Name: Brittany Hill   MRN: [id number]    DOB: 03-29-1951   Date:07/18/2024       Progress Note  Subjective  Chief Complaint  Chief Complaint  Patient presents with   Cough   Headache   Generalized Body Aches   Sore Throat    Discussed the use of AI scribe software for clinical note transcription with the patient, who gave verbal consent to proceed.  History of Present Illness Brittany Hill is a 73 year old female who presents with symptoms of a viral respiratory illness.  Symptoms began suddenly yesterday with a pounding headache, sore throat, and postnasal drip leading to a cough. The cough is worsening, particularly at night, and is productive of thick, green sputum. Nasal discharge was initially discolored but is now clear. She has a fever of 100F.  She has been taking over-the-counter medications including Mucinex and Allegra for sinus relief, as well as Tylenol and her regular pain medications for chronic neck pain. She has previously used inhalers but has not tried Trelegy before. She has taken Tessalon  Perles for cough in the past without relief and cannot tolerate codeine due to nausea.  No history of asthma or smoking. She mentions her lungs are hurting and feels like she is developing bronchitis. Appetite remains normal, though she feels a little light.    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   History of total knee replacement, bilateral 05/19/2022   At moderate risk for fall 05/19/2022   Syncope 01/17/2022   Acquired stenosis of bilateral nasolacrimal duct 02/08/2021   Prediabetes 03/22/2020   Acquired trigger finger 01/06/2020   Bouchard's nodes (with arthropathy) 01/06/2020   Chronic post-operative pain 09/05/2019   Neck pain 09/05/2019   Cervical radiculopathy 09/05/2019   Displacement of cervical intervertebral disc 05/14/2019   Fatty liver disease,  nonalcoholic 03/28/2019   Osteopenia 10/20/2018   Class 1 obesity with serious comorbidity and body mass index (BMI) of 32.0 to 32.9 in adult 09/27/2018   Cataract, left 04/02/2018   Osteoarthritis of left ankle 02/16/2018   Asthma without status asthmaticus 10/05/2017   Genetic testing 08/28/2017   Family hx-breast malignancy 08/02/2017   Dense breast tissue on mammogram 07/28/2016   Left sided sciatica 12/19/2015   Left lumbar radiculopathy 12/16/2015   Hypomagnesemia 12/06/2015   Hypokalemia 12/02/2015   Fibromyalgia    MS (multiple sclerosis) (HCC)    Chronic constipation    OA (osteoarthritis)    Hypothyroidism    IFG (impaired fasting glucose)    Hypertension    Hyperlipidemia    GERD (gastroesophageal reflux disease)    MDD (major depressive disorder), recurrent, in full remission    Anxiety    TMJ syndrome     Social History   Tobacco Use   Smoking status: Never   Smokeless tobacco: Never  Substance Use Topics   Alcohol use: No    Current Medications[1]  Allergies[2]  ROS  Ten systems reviewed and is negative except as mentioned in HPI    Objective  Vitals:   07/18/24 0902  BP: 102/60  Pulse: 85  Resp: 16  Temp: 97.8 F (36.6 C)  TempSrc: Oral  SpO2: 97%  Weight: 192 lb 3.2 oz (87.2 kg)  Height: 5' 4 (1.626 m)    Body mass index is 32.99 kg/m.  Physical Exam CONSTITUTIONAL: Patient appears well-developed and well-nourished. No distress. HEENT: Head atraumatic,  normocephalic, neck supple. Throat normal. CARDIOVASCULAR: Normal rate, regular rhythm and normal heart sounds. No murmur heard. No BLE edema. PULMONARY: Effort normal and breath sounds normal. Lungs clear to auscultation. No respiratory distress. ABDOMINAL: There is no tenderness or distention. MUSCULOSKELETAL: Normal gait. Without gross motor or sensory deficit. PSYCHIATRIC: Patient has a normal mood and affect. Behavior is normal. Judgment and thought content normal.  Recent  Results (from the past 2160 hours)  CBC with Differential/Platelet     Status: Abnormal   Collection Time: 05/22/24 11:34 AM  Result Value Ref Range   WBC 4.9 3.8 - 10.8 Thousand/uL   RBC 3.87 3.80 - 5.10 Million/uL   Hemoglobin 11.8 11.7 - 15.5 g/dL   HCT 65.2 (L) 64.9 - 54.9 %   MCV 89.7 80.0 - 100.0 fL   MCH 30.5 27.0 - 33.0 pg   MCHC 34.0 32.0 - 36.0 g/dL    Comment: For adults, a slight decrease in the calculated MCHC value (in the range of 30 to 32 g/dL) is most likely not clinically significant; however, it should be interpreted with caution in correlation with other red cell parameters and the patient's clinical condition.    RDW 13.2 11.0 - 15.0 %   Platelets 192 140 - 400 Thousand/uL   MPV 10.7 7.5 - 12.5 fL   Neutro Abs 2,680 1,500 - 7,800 cells/uL   Absolute Lymphocytes 1,495 850 - 3,900 cells/uL   Absolute Monocytes 524 200 - 950 cells/uL   Eosinophils Absolute 181 15 - 500 cells/uL   Basophils Absolute 20 0 - 200 cells/uL   Neutrophils Relative % 54.7 %   Total Lymphocyte 30.5 %   Monocytes Relative 10.7 %   Eosinophils Relative 3.7 %   Basophils Relative 0.4 %  Comprehensive metabolic panel with GFR     Status: None   Collection Time: 05/22/24 11:34 AM  Result Value Ref Range   Glucose, Bld 95 65 - 99 mg/dL    Comment: .            Fasting reference interval .    BUN 8 7 - 25 mg/dL   Creat 9.15 9.39 - 8.99 mg/dL   eGFR 73 > OR = 60 fO/fpw/8.26f7   BUN/Creatinine Ratio SEE NOTE: 6 - 22 (calc)    Comment:    Not Reported: BUN and Creatinine are within    reference range. .    Sodium 138 135 - 146 mmol/L   Potassium 3.9 3.5 - 5.3 mmol/L   Chloride 101 98 - 110 mmol/L   CO2 27 20 - 32 mmol/L   Calcium  10.1 8.6 - 10.4 mg/dL   Total Protein 7.2 6.1 - 8.1 g/dL   Albumin 5.1 3.6 - 5.1 g/dL   Globulin 2.1 1.9 - 3.7 g/dL (calc)   AG Ratio 2.4 1.0 - 2.5 (calc)   Total Bilirubin 0.3 0.2 - 1.2 mg/dL   Alkaline phosphatase (APISO) 50 37 - 153 U/L   AST 22 10  - 35 U/L   ALT 15 6 - 29 U/L  Lipid panel     Status: Abnormal   Collection Time: 05/22/24 11:34 AM  Result Value Ref Range   Cholesterol 143 <200 mg/dL   HDL 53 > OR = 50 mg/dL   Triglycerides 842 (H) <150 mg/dL   LDL Cholesterol (Calc) 67 mg/dL (calc)    Comment: Reference range: <100 . Desirable range <100 mg/dL for primary prevention;   <70 mg/dL for patients with CHD or diabetic patients  with > or = 2 CHD risk factors. SABRA LDL-C is now calculated using the Martin-Hopkins  calculation, which is a validated novel method providing  better accuracy than the Friedewald equation in the  estimation of LDL-C.  Gladis APPLETHWAITE et al. SANDREA. 7986;689(80): 2061-2068  (http://education.[facility].com/faq/FAQ164)    Total CHOL/HDL Ratio 2.7 <5.0 (calc)   Non-HDL Cholesterol (Calc) 90 <869 mg/dL (calc)    Comment: For patients with diabetes plus 1 major ASCVD risk  factor, treating to a non-HDL-C goal of <100 mg/dL  (LDL-C of <29 mg/dL) is considered a therapeutic  option.   Microalbumin / creatinine urine ratio     Status: None   Collection Time: 05/22/24 11:34 AM  Result Value Ref Range   Creatinine, Urine 128 20 - 275 mg/dL   Microalb, Ur 0.7 mg/dL    Comment: Reference Range Not established    Microalb Creat Ratio 5 <30 mg/g creat    Comment: . The ADA defines abnormalities in albumin excretion as follows: SABRA Albuminuria Category        Result (mg/g creatinine) . Normal to Mildly increased   <30 Moderately increased         30-299  Severely increased           > OR = 300 . The ADA recommends that at least two of three specimens collected within a 3-6 month period be abnormal before considering a patient to be within a diagnostic category.   Hemoglobin A1c     Status: Abnormal   Collection Time: 05/22/24 11:34 AM  Result Value Ref Range   Hgb A1c MFr Bld 6.1 (H) <5.7 %    Comment: For someone without known diabetes, a hemoglobin  A1c value between 5.7% and 6.4% is  consistent with prediabetes and should be confirmed with a  follow-up test. . For someone with known diabetes, a value <7% indicates that their diabetes is well controlled. A1c targets should be individualized based on duration of diabetes, age, comorbid conditions, and other considerations. . This assay result is consistent with an increased risk of diabetes. . Currently, no consensus exists regarding use of hemoglobin A1c for diagnosis of diabetes for children. .    Mean Plasma Glucose 128 mg/dL   eAG (mmol/L) 7.1 mmol/L  TSH     Status: Abnormal   Collection Time: 05/22/24 11:34 AM  Result Value Ref Range   TSH 5.05 (H) 0.40 - 4.50 mIU/L  Analyzer ANA IFA w/RFLX Titer/Pattern,Systemic Autoimmune Panel 1     Status: Abnormal   Collection Time: 05/22/24 11:34 AM  Result Value Ref Range   Anti Nuclear Antibody (ANA) POSITIVE (A) NEGATIVE    Comment: ANA IFA is a first line screen for detecting the presence of up to approximately 150 autoantibodies in various autoimmune diseases. A positive ANA IFA result is suggestive of autoimmune disease and reflexes to titer and pattern. Further laboratory testing may be considered if clinically indicated. . For additional information, please refer to http://education.[facility].com/faq/FAQ177 (This link is being provided for informational/educational purposes only.)    DNA Ab (DS) Crithidia, IFA NEGATIVE NEGATIVE   Chromatin (Nucleosomal) Antibody <1.0 NEG <1.0 NEGATIVE AI   ENA SM Ab Ser-aCnc <1.0 NEG <1.0 NEGATIVE AI   SM/RNP <1.0 NEG <1.0 NEGATIVE AI   Ribonucleic Protein(ENA) Antibody, IgG <1.0 NEG <1.0 NEGATIVE AI   SSA (Ro) (ENA) Antibody, IgG <1.0 NEG <1.0 NEGATIVE AI   SSB (La) (ENA) Antibody, IgG <1.0 NEG <1.0 NEGATIVE AI   Scleroderma (Scl-70) (ENA)  Antibody, IgG <1.0 NEG <1.0 NEGATIVE AI   Jo-1 Autoabs <1.0 NEG <1.0 NEGATIVE AI   Centromere Ab Screen <1.0 NEG <1.0 NEGATIVE AI   C3 Complement 126 83 - 193 mg/dL    C4 Complement 18 15 - 57 mg/dL   Anticardiolipin IgA 89.8 APL-U/mL    Comment: . SABRA Value        Interpretation -----        -------------- <20.0        Antibody not detected > or = 20.0  Antibody detected .    Anticardiolipin IgG 2.6 GPL-U/mL    Comment: . SABRA Value        Interpretation -----        -------------- <20.0        Antibody not detected > or = 20.0  Antibody detected .    Anticardiolipin IgM 4.9 MPL-U/mL    Comment: . SABRA Value        Interpretation -----        -------------- <20.0        Antibody not detected > or = 20.0  Antibody detected .    Beta-2 Glyco 1 IgA 9.0 U/mL    Comment: . Value        Interpretation -----        -------------- <20.0        Antibody not detected > or = 20.0  Antibody detected .    Beta-2 Glyco I IgG 2.8 U/mL    Comment: . Value        Interpretation -----        -------------- <20.0        Antibody not detected > or = 20.0  Antibody detected .    Beta-2 Glyco 1 IgM 5.3 U/mL    Comment: The antiphospholipid antibody syndrome (APS) is a clinical-pathologic correlation that includes a clinical event (e.g. arterial or venous thrombosis, pregnancy morbidity) and persistent positive antiphospholipid antibodies (IgM, IgG Cardiolipin or b2GPI antibodies greater than the 99th percentile; or a lupus anticoagulant). International consensus guidelines for APS suggest waiting at least 12 weeks before retesting to confirm antibody persistence. The Systemic Lupus International [facility] immunological classification criteria for systemic lupus erythematosus (SLE) include testing for isotype IgA, which has yet to be incorporated into APS criteria. Low level antiphospholipid antibodies may sometimes be detected in the setting of infection, drug therapy or aging. . For additional information, please refer to http://education.[facility].com/faq/FAQ109 (This link is being provided for  informational/educational purposes only.) . Value        Interpret ation -----        -------------- <20.0        Antibody not detected > or = 20.0  Antibody detected .    Rheumatoid Factor (IgA) <5 U    Comment:                                       Reference Range:                                       <=6  NEGATIVE                                       >  6   POSITIVE    Rheumatoid Factor (IgG) <5 U    Comment:                                       Reference Range:                                       <=6  NEGATIVE                                       >6   POSITIVE    Rheumatoid Factor (IgM) <5 U    Comment:                                       Reference Range:                                       <=6 NEGATIVE                                       >6  POSITIVE    Cyclic Citrullin Peptide Ab <83 Units    Comment:                                       Reference Range:                                       NEGATIVE:                                          <20                                       WEAK POSITIVE:                                          20-39                                       MODERATE POSITIVE:                                          40-59  STRONG POSITIVE                                          >59    MUTATED CITRULLINATED VIMENTIN (MCV) AB <20 <20 U/mL    Comment: . Anti-mutated citrullinated vimentin antibody may be used as a second-line marker of rheumatoid arthritis, in addition to rheumatoid factor and anti-cyclic citrullinated peptide (CCP). .    Thyroperoxidase Ab SerPl-aCnc 228 (H) <9 IU/mL  Anti-nuclear ab-titer (ANA titer)     Status: Abnormal   Collection Time: 05/22/24 11:34 AM  Result Value Ref Range   ANA Titer 1 1:80 (H) titer    Comment: A low level ANA titer may be present in pre-clinical autoimmune diseases and normal individuals. . Reference Ranges for Anti-Nuclear Ab Titer: .     <1:40      Negative    1:40-1:80  Low Antibody Level    >1:80      Elevated Antibody Level    ANA Pattern 1 CYTOPLASMIC, RETICULAR/AMA     Comment: Coarse granular filamentous staining extending throughout the cytoplasm (e.g., anti-mitochondrial antibodies). Pattern is common in primary biliary cholangitis (PBC), systemic sclerosis, and rare in other systemic autoimmune rheumatic diseases (SARD). . AC-21: Reticular/AMA International Consensus on ANA Patterns severties.uy    ANA TITER 1:320 (H) titer    Comment: . Reference Ranges for Anti-Nuclear Ab Titer: .    <1:40      Negative    1:40-1:80  Low Antibody Level    >1:80      Elevated Antibody Level    ANA PATTERN NUCLEAR, CENTROMERE     Comment: Centromere pattern is associated with limited cutaneous systemic sclerosis, CREST (Calcinosis, Raynaud's, Esophageal dysmotility, Sclerodactyly, Telangiectasia), primary biliary cholangitis (PBC), and other autoimmune diseases. . AC-3: Centromere International Consensus on ANA Patterns severties.uy   POCT rapid strep A     Status: None   Collection Time: 07/18/24  9:24 AM  Result Value Ref Range   Rapid Strep A Screen Negative Negative  POC Covid19/Flu A&B Antigen     Status: None   Collection Time: 07/18/24  9:24 AM  Result Value Ref Range   Influenza A Antigen, POC Negative Negative   Influenza B Antigen, POC Negative Negative   Covid Antigen, POC Negative Negative      Assessment & Plan Acute upper respiratory infection Symptoms suggest viral bronchitis. Negative COVID-19 and influenza tests. No bacterial infection; antibiotics not indicated. Discussed bacterial superinfection risk. Trelegy sample provided for inflammation. Promethazine  with dextromethorphan prescribed for cough. - Provided Trelegy sample for inhalation. - Prescribed promethazine  with dextromethorphan, 2.5 mL increasing to 5 mL as needed, primarily at  night. - Advised rest, fluids, saline spray, warm salted water gargles. - Instructed to monitor symptoms and contact via MyChart if not improved by Monday or Tuesday.              [1]  Current Outpatient Medications:    acetaminophen (TYLENOL) 500 MG tablet, Take 1,000 mg by mouth every 6 (six) hours as needed., Disp: , Rfl:    amLODipine  (NORVASC ) 5 MG tablet, Take 5 mg by mouth daily., Disp: , Rfl:    DULoxetine  (CYMBALTA ) 30 MG capsule, Take 2 CAPSULES IN THE MORNING AND ONE IN THE EVENING., Disp: 270 capsule, Rfl: 1   EPINEPHrine  0.3 mg/0.3 mL IJ SOAJ injection, Inject 0.3 mg into the muscle once., Disp: ,  Rfl:    famotidine  (PEPCID ) 20 MG tablet, Take 1 tablet (20 mg total) by mouth 2 (two) times daily as needed for heartburn or indigestion., Disp: 60 tablet, Rfl: 5   fluticasone  (VERAMYST) 27.5 MCG/SPRAY nasal spray, Place 1 spray into the nose daily. Inhale 1 puff (100 mcg total) every morning., Disp: 10 g, Rfl: 2   hydrALAZINE  (APRESOLINE ) 10 MG tablet, Take 1 tablet (10 mg total) by mouth 2 (two) times daily as needed (take for sustained BP >160/100)., Disp: 30 tablet, Rfl: 1   ibuprofen (ADVIL) 600 MG tablet, Take 600 mg by mouth every 6 (six) hours. Take 1 tablet (600 mg total) by mouth every 6 (six) hours for 5 days., Disp: , Rfl:    ipratropium (ATROVENT  HFA) 17 MCG/ACT inhaler, Inhale 1 puff into the lungs every 6 (six) hours.  inhaler  Inhale daily., Disp: , Rfl:    ipratropium (ATROVENT ) 0.03 % nasal spray, Place 2 sprays into both nostrils every 12 (twelve) hours., Disp: 30 mL, Rfl: 12   levothyroxine  (SYNTHROID ) 88 MCG tablet, Take 1 tablet (88 mcg total) by mouth daily., Disp: 90 tablet, Rfl: 0   linaclotide  (LINZESS ) 72 MCG capsule, Take 1 capsule (72 mcg total) by mouth daily before breakfast., Disp: 30 capsule, Rfl: 5   metFORMIN  (GLUCOPHAGE -XR) 500 MG 24 hr tablet, Take 1 tablet (500 mg total) by mouth 2 (two) times daily., Disp: 180 tablet, Rfl: 0   metoprolol   succinate (TOPROL -XL) 50 MG 24 hr tablet, Take 1 tablet (50 mg total) by mouth daily., Disp: 90 tablet, Rfl: 1   montelukast  (SINGULAIR ) 10 MG tablet, Take 1 tablet (10 mg total) by mouth at bedtime., Disp: 90 tablet, Rfl: 1   naloxone (NARCAN) nasal spray 4 mg/0.1 mL, Place into the nose., Disp: , Rfl:    ondansetron  (ZOFRAN  ODT) 4 MG disintegrating tablet, Take 1 tablet (4 mg total) by mouth every 8 (eight) hours as needed for nausea or vomiting., Disp: 20 tablet, Rfl: 1   oxyCODONE  (OXY IR/ROXICODONE ) 5 MG immediate release tablet, Take 5 mg by mouth 2 (two) times daily as needed., Disp: , Rfl:    oxycodone  (OXY-IR) 5 MG capsule, Take 5 mg by mouth every 6 (six) hours as needed., Disp: , Rfl:    pantoprazole  (PROTONIX ) 40 MG tablet, Take 1 tablet (40 mg total) by mouth at bedtime., Disp: 90 tablet, Rfl: 1   pregabalin  (LYRICA ) 100 MG capsule, Take 1 capsule (100 mg total) by mouth in the morning AND 2 capsules (200 mg total) at bedtime., Disp: 270 capsule, Rfl: 1   rosuvastatin  (CRESTOR ) 10 MG tablet, Take 1 tablet (10 mg total) by mouth daily. Take 1 tablet (10 mg total) by mouth nightly., Disp: 90 tablet, Rfl: 2   spironolactone  (ALDACTONE ) 25 MG tablet, Take 1 tablet (25 mg total) by mouth daily. With breakfast, Disp: 90 tablet, Rfl: 1   tiZANidine  (ZANAFLEX ) 4 MG tablet, Take 2 tablets (8 mg total) by mouth 3 (three) times daily., Disp: 540 tablet, Rfl: 1   XTAMPZA  ER 9 MG C12A, Take 1 capsule by mouth daily., Disp: , Rfl:  [2]  Allergies Allergen Reactions   Shellfish Allergy Rash and Swelling   Codeine Nausea And Vomiting   Sulfa Antibiotics Swelling   Shellfish Protein-Containing Drug Products Hives

## 2024-07-21 ENCOUNTER — Other Ambulatory Visit: Payer: Self-pay | Admitting: Family Medicine

## 2024-07-21 ENCOUNTER — Encounter: Payer: Self-pay | Admitting: Family Medicine

## 2024-07-21 MED ORDER — AZITHROMYCIN 250 MG PO TABS: ORAL_TABLET | ORAL | 0 refills | Status: AC

## 2024-07-29 ENCOUNTER — Ambulatory Visit: Payer: Self-pay

## 2024-07-29 NOTE — Telephone Encounter (Signed)
 Viral URI since 12/12 Became febrile shortly after 12/12 appt, got a Z pak and cough syrup. She is out of both now.  Still having a lot of green congestion and cough, and SOB upon exertion with an SpO2 of 92% while ambulating.  Pt encouraged to go to urgent care today, she states she may but prefers to see her PCP. Pt has been advised to call back if anything changes or worsens, and in the meantime we will send a message to PCP to see if they can see her tomorrow.    FYI Only or Action Required?: Action required by provider: update on patient condition.  Patient was last seen in primary care on 07/18/2024 by Glenard Mire, MD.  Called Nurse Triage reporting URI.  Symptoms began 07/18/24.  Interventions attempted: Prescription medications: z pak and cough syrup.  Symptoms are: unchanged.  Triage Disposition: See HCP Within 4 Hours (Or PCP Triage)  Patient/caregiver understands and will follow disposition?: No, wishes to speak with PCP  Copied from CRM #[id number]. Topic: Clinical - Red Word Triage >> Jul 29, 2024  1:35 PM Rosaria E wrote: Red Word that prompted transfer to Nurse Triage: discolored phlegm, symptoms not improving after finishing antibiotic Reason for Disposition  [1] MILD difficulty breathing (e.g., minimal/no SOB at rest, SOB with walking, pulse < 100) AND [2] still present when not coughing  Answer Assessment - Initial Assessment Questions 1. ONSET: When did the cough begin?      07/18/24 was seen for URI, developed fever a few days after, was given a Z Pak, fever resolved, still having productive cough and SOB upon exertion with SpO2 dropping to 92% while ambulating  2. SEVERITY: How bad is the cough today?      Still pretty bad, I'm coughing most of the day, it's waking me several times during the night  3. SPUTUM: Describe the color of your sputum (e.g., none, dry cough; clear, white, yellow, green)     Green  4. HEMOPTYSIS: Are you coughing up any  blood? If Yes, ask: How much? (e.g., flecks, streaks, tablespoons, etc.)     Yes, streaks of blood a few days ago  5. DIFFICULTY BREATHING: Are you having difficulty breathing? If Yes, ask: How bad is it? (e.g., mild, moderate, severe)      Pt reports my chest hurts a little bit, I can feel it when I cough, or when I get up and do something, reports mild SOB upon exertion, reports home SpO2 95% while sitting, 92% when walking around  6. FEVER: Do you have a fever? If Yes, ask: What is your temperature, how was it measured, and when did it start?     Afebrile for 1 week   8. LUNG HISTORY: Do you have any history of lung disease?  (e.g., pulmonary embolus, asthma, emphysema)     Asthma   10. OTHER SYMPTOMS: Do you have any other symptoms? 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

## 2024-08-05 NOTE — Telephone Encounter (Signed)
Tried contacting pt no answer.

## 2024-08-22 ENCOUNTER — Ambulatory Visit: Admitting: Nurse Practitioner

## 2024-08-22 ENCOUNTER — Encounter: Payer: Self-pay | Admitting: Nurse Practitioner

## 2024-08-22 VITALS — BP 138/88 | HR 72 | Temp 98.0°F | Ht 64.0 in | Wt 186.0 lb

## 2024-08-22 DIAGNOSIS — E039 Hypothyroidism, unspecified: Secondary | ICD-10-CM

## 2024-08-22 DIAGNOSIS — G35D Multiple sclerosis, unspecified: Secondary | ICD-10-CM

## 2024-08-22 DIAGNOSIS — M5412 Radiculopathy, cervical region: Secondary | ICD-10-CM

## 2024-08-22 DIAGNOSIS — M797 Fibromyalgia: Secondary | ICD-10-CM | POA: Diagnosis not present

## 2024-08-22 DIAGNOSIS — R7303 Prediabetes: Secondary | ICD-10-CM

## 2024-08-22 DIAGNOSIS — F3342 Major depressive disorder, recurrent, in full remission: Secondary | ICD-10-CM

## 2024-08-22 DIAGNOSIS — I361 Nonrheumatic tricuspid (valve) insufficiency: Secondary | ICD-10-CM | POA: Diagnosis not present

## 2024-08-22 DIAGNOSIS — I340 Nonrheumatic mitral (valve) insufficiency: Secondary | ICD-10-CM

## 2024-08-22 DIAGNOSIS — F419 Anxiety disorder, unspecified: Secondary | ICD-10-CM

## 2024-08-22 DIAGNOSIS — K5909 Other constipation: Secondary | ICD-10-CM

## 2024-08-22 DIAGNOSIS — K219 Gastro-esophageal reflux disease without esophagitis: Secondary | ICD-10-CM

## 2024-08-22 DIAGNOSIS — G8929 Other chronic pain: Secondary | ICD-10-CM | POA: Insufficient documentation

## 2024-08-22 DIAGNOSIS — E782 Mixed hyperlipidemia: Secondary | ICD-10-CM

## 2024-08-22 DIAGNOSIS — I10 Essential (primary) hypertension: Secondary | ICD-10-CM | POA: Diagnosis not present

## 2024-08-22 DIAGNOSIS — M1990 Unspecified osteoarthritis, unspecified site: Secondary | ICD-10-CM

## 2024-08-22 DIAGNOSIS — M329 Systemic lupus erythematosus, unspecified: Secondary | ICD-10-CM | POA: Insufficient documentation

## 2024-08-22 DIAGNOSIS — E66811 Obesity, class 1: Secondary | ICD-10-CM

## 2024-08-22 DIAGNOSIS — G629 Polyneuropathy, unspecified: Secondary | ICD-10-CM | POA: Insufficient documentation

## 2024-08-22 DIAGNOSIS — J4520 Mild intermittent asthma, uncomplicated: Secondary | ICD-10-CM

## 2024-08-22 MED ORDER — PREGABALIN 100 MG PO CAPS: ORAL_CAPSULE | ORAL | 1 refills | Status: AC

## 2024-08-22 MED ORDER — DULOXETINE HCL 30 MG PO CPEP: ORAL_CAPSULE | ORAL | 1 refills | Status: AC

## 2024-08-22 MED ORDER — METFORMIN HCL ER 500 MG PO TB24: 500.0000 mg | ORAL_TABLET | Freq: Two times a day (BID) | ORAL | 1 refills | Status: AC

## 2024-08-22 NOTE — Progress Notes (Signed)
 "  BP 138/88   Pulse 72   Temp 98 F (36.7 C)   Ht 5' 4 (1.626 m)   Wt 186 lb (84.4 kg)   SpO2 98%   BMI 31.93 kg/m    Subjective:    Patient ID: Brittany Hill, female    DOB: [birth year], 74 y.o.   MRN: [id number]  HPI: Brittany Hill is a 74 y.o. female  Chief Complaint  Patient presents with   Medical Management of Chronic Issues   Discussed the use of AI scribe software for clinical note transcription with the patient, who gave verbal consent to proceed.  History of Present Illness Brittany Hill is a 74 year old female who presents for a routine follow-up.  Recent febrile illness - Illness duration of three weeks with high fever up to 103F and oxygen saturation of 91-92% - Negative testing for influenza, COVID-19, and streptococcal infection - Associated with a 10-pound weight loss, with partial weight regain during recovery - Felt generally unwell during illness, now recovering - No new symptoms apart from those related to recent illness and chronic conditions  Cutaneous and musculoskeletal manifestations of lupus - New diagnosis of lupus confirmed by positive skin biopsy - Ongoing neck pain, worsened during recent illness, possibly due to lupus flare - Scheduled rheumatology evaluation on April 1st at [facility] in [place]  Chronic pain and fibromyalgia - Persistent joint pain in neck, back, hip, and leg - History of cervical and lumbar spinal fusion - Ongoing lumbar radiculopathy and fibromyalgia managed by pain management - Current medications include Lyrica , tizanidine , and oxycodone   Depressive symptoms - Depression managed with Cymbalta  60 mg in the morning and 30 mg at bedtime  Thyroid  dysfunction - History of hypothyroidism - Last TSH 5.05 in October - Currently taking levothyroxine  88 mcg daily - Requires TSH recheck prior to medication refill  Asthma and respiratory symptoms - Asthma managed with Trelegy Ellipta  and Singulair  10  mg daily  Gastrointestinal symptoms - GERD managed with Protonix  and Pepcid  20 mg as needed  Cardiovascular risk factors and hypertension - Blood pressure today 138/88 mmHg - Medications include amlodipine , metoprolol , hydralazine  as needed, and rosuvastatin  for hyperlipidemia  Osteoporosis - Receiving Prolia injections for osteoporosis         07/18/2024    9:01 AM 05/22/2024   11:06 AM 11/23/2023    1:21 PM  Depression screen PHQ 2/9  Decreased Interest 0 0 2  Down, Depressed, Hopeless 0 0 2  PHQ - 2 Score 0 0 4  Altered sleeping 0 0 0  Tired, decreased energy 0 0 0  Change in appetite 0 0 0  Feeling bad or failure about yourself  0 0 0  Trouble concentrating 0 0 0  Moving slowly or fidgety/restless 0 0 0  Suicidal thoughts 0 0 0  PHQ-9 Score 0 0  4   Difficult doing work/chores   Not difficult at all     Data saved with a previous flowsheet row definition    Relevant past medical, surgical, family and social history reviewed and updated as indicated. Interim medical history since our last visit reviewed. Allergies and medications reviewed and updated.  Review of Systems  Constitutional: Negative for fever or weight change.  Respiratory: Negative for cough and shortness of breath.   Cardiovascular: Negative for chest pain or palpitations.  Gastrointestinal: Negative for abdominal pain, no bowel changes.  Musculoskeletal: Negative for gait problem or joint swelling.  Skin: Negative for  rash.  Neurological: Negative for dizziness or headache.  No other specific complaints in a complete review of systems (except as listed in HPI above).      Objective:      BP 138/88   Pulse 72   Temp 98 F (36.7 C)   Ht 5' 4 (1.626 m)   Wt 186 lb (84.4 kg)   SpO2 98%   BMI 31.93 kg/m    Wt Readings from Last 3 Encounters:  08/22/24 186 lb (84.4 kg)  07/18/24 192 lb 3.2 oz (87.2 kg)  05/22/24 190 lb (86.2 kg)    Physical Exam VITALS: BP- 138/88 MEASUREMENTS:  Weight- 186. GENERAL: Alert, cooperative, well developed, no acute distress. HEENT: Normocephalic, normal oropharynx, moist mucous membranes. CHEST: Clear to auscultation bilaterally, no wheezes, rhonchi, or crackles. CARDIOVASCULAR: Normal heart rate and rhythm, S1 and S2 normal without murmurs. ABDOMEN: Soft, non-tender, non-distended, without organomegaly, normal bowel sounds. EXTREMITIES: No cyanosis or edema. NEUROLOGICAL: Cranial nerves grossly intact, moves all extremities without gross motor or sensory deficit.  Results for orders placed or performed in visit on 07/21/24  OPHTHALMOLOGY REPORT-SCANNED   Collection Time: 07/17/24  2:16 PM  Result Value Ref Range   HM Diabetic Eye Exam No Retinopathy No Retinopathy   A Comment            Assessment & Plan:   Problem List Items Addressed This Visit       Cardiovascular and Mediastinum   Hypertension - Primary (Chronic)   Mitral valve insufficiency   Tricuspid valve insufficiency     Respiratory   Asthma without status asthmaticus     Digestive   Chronic constipation (Chronic)   GERD (gastroesophageal reflux disease) (Chronic)     Endocrine   Hypothyroidism (Chronic)   Relevant Orders   TSH     Nervous and Auditory   MS (multiple sclerosis) (HCC) (Chronic)   Cervical radiculopathy   Relevant Medications   pregabalin  (LYRICA ) 100 MG capsule   DULoxetine  (CYMBALTA ) 30 MG capsule   Neuropathy   Relevant Medications   DULoxetine  (CYMBALTA ) 30 MG capsule     Musculoskeletal and Integument   OA (osteoarthritis)     Other   Fibromyalgia (Chronic)   Relevant Medications   pregabalin  (LYRICA ) 100 MG capsule   DULoxetine  (CYMBALTA ) 30 MG capsule   Hyperlipidemia (Chronic)   MDD (major depressive disorder), recurrent, in full remission   Relevant Medications   DULoxetine  (CYMBALTA ) 30 MG capsule   Anxiety   Relevant Medications   DULoxetine  (CYMBALTA ) 30 MG capsule   Class 1 obesity with serious comorbidity  and body mass index (BMI) of 32.0 to 32.9 in adult   Relevant Medications   metFORMIN  (GLUCOPHAGE -XR) 500 MG 24 hr tablet   Prediabetes   Relevant Medications   metFORMIN  (GLUCOPHAGE -XR) 500 MG 24 hr tablet   Systemic lupus erythematosus (HCC)   Chronic back pain   Relevant Medications   pregabalin  (LYRICA ) 100 MG capsule   DULoxetine  (CYMBALTA ) 30 MG capsule     Assessment and Plan Assessment & Plan Hypothyroidism TSH level was 5.05 in October, indicating suboptimal control. Currently on levothyroxine  88 mcg daily. - Rechecked TSH level before adjusting levothyroxine  dose - Will adjust levothyroxine  dose based on TSH results  Systemic lupus erythematosus Positive skin biopsy for lupus. Recent flu-like symptoms possibly exacerbated by lupus flare. - Attend rheumatology appointment on April 1st at [facility] in [place]  Primary hypertension Blood pressure today is 138/88 mmHg, indicating controlled hypertension. -currently  on amlodipine  5 mg daily, hydralazine  10 mg BID PRN, metoprolol  50 mg daily, spirnolactone 25 mg daily  Nonrheumatic mitral and tricuspid valve regurgitation -managed by cardiology  Gastroesophageal reflux disease GERD is well controlled with current medication regimen. - Continue current GERD management pantoprazole  40 mg daily, pepcid  20 mg BID PRN  Osteoarthritis Chronic joint pain reported, particularly in the neck, back, hip, and leg. Managed with pain management and medications.  Obesity Weight is 186 pounds. Recent weight loss due to illness but regain what she lost.   Fibromyalgia/neuropathy Chronic pain managed with Lyrica  and tizanidine . -also seen by pain management  Major depressive disorder, in remission Depression is managed with Cymbalta . - Continue Cymbalta  regimen  Chronic constipation Managed with Linzess . - Continue Linzess  regimen  Cervical radiculopathy and chronic back pain Ongoing pain despite cervical and lumbar spinal  fusion. Managed with pain management.  MS -stable, not currently on treatment   Asthma -stable on singulair , atrovent , trelegy ellipta   Hyperlipidemia -Lipid Panel     Component Value Date/Time   CHOL 143 05/22/2024 1134   CHOL 150 06/03/2015 0927   TRIG 157 (H) 05/22/2024 1134   HDL 53 05/22/2024 1134   HDL 56 06/03/2015 0927   CHOLHDL 2.7 05/22/2024 1134   VLDL 28 03/21/2017 1026   LDLCALC 67 05/22/2024 1134   LABVLDL 25 06/03/2015 9072   -continue rosuvastatin  10 mg daily    Prediabetes -metformin  500 mg daily Lab Results  Component Value Date   HGBA1C 6.1 (H) 05/22/2024    Follow up plan: Return in about 6 months (around 02/19/2025) for follow up. "

## 2024-08-23 LAB — TSH: TSH: 0.53 m[IU]/L (ref 0.40–4.50)

## 2024-08-25 ENCOUNTER — Ambulatory Visit: Payer: Self-pay | Admitting: Nurse Practitioner

## 2024-08-25 ENCOUNTER — Other Ambulatory Visit: Payer: Self-pay | Admitting: Nurse Practitioner

## 2024-08-25 DIAGNOSIS — E039 Hypothyroidism, unspecified: Secondary | ICD-10-CM

## 2024-08-25 MED ORDER — LEVOTHYROXINE SODIUM 88 MCG PO TABS: 88.0000 ug | ORAL_TABLET | Freq: Every day | ORAL | 0 refills | Status: AC

## 2024-10-16 ENCOUNTER — Ambulatory Visit

## 2025-02-19 ENCOUNTER — Ambulatory Visit: Admitting: Nurse Practitioner
